# Patient Record
Sex: Male | Born: 1950
Health system: Southern US, Community
[De-identification: ages and names within clinical notes are randomized; demographics above are authoritative.]

## PROBLEM LIST (undated history)

## (undated) DIAGNOSIS — G893 Neoplasm related pain (acute) (chronic): Secondary | ICD-10-CM

## (undated) DIAGNOSIS — N400 Enlarged prostate without lower urinary tract symptoms: Secondary | ICD-10-CM

## (undated) DIAGNOSIS — Z5111 Encounter for antineoplastic chemotherapy: Secondary | ICD-10-CM

## (undated) DIAGNOSIS — I6529 Occlusion and stenosis of unspecified carotid artery: Secondary | ICD-10-CM

## (undated) DIAGNOSIS — C801 Malignant (primary) neoplasm, unspecified: Secondary | ICD-10-CM

## (undated) DIAGNOSIS — I10 Essential (primary) hypertension: Secondary | ICD-10-CM

## (undated) DIAGNOSIS — I739 Peripheral vascular disease, unspecified: Secondary | ICD-10-CM

## (undated) HISTORY — DX: Occlusion and stenosis of unspecified carotid artery: I65.29

## (undated) HISTORY — DX: Encounter for antineoplastic chemotherapy: Z51.11

## (undated) HISTORY — DX: Neoplasm related pain (acute) (chronic): G89.3

## (undated) HISTORY — PX: OTHER SURGICAL HISTORY: SHX169

## (undated) HISTORY — DX: Benign prostatic hyperplasia without lower urinary tract symptoms: N40.0

---

## 2015-04-28 DIAGNOSIS — I10 Essential (primary) hypertension: Secondary | ICD-10-CM

## 2015-04-28 DIAGNOSIS — I739 Peripheral vascular disease, unspecified: Secondary | ICD-10-CM | POA: Insufficient documentation

## 2015-04-28 DIAGNOSIS — E785 Hyperlipidemia, unspecified: Secondary | ICD-10-CM | POA: Insufficient documentation

## 2015-04-28 HISTORY — DX: Peripheral vascular disease, unspecified: I73.9

## 2015-04-28 HISTORY — DX: Hyperlipidemia, unspecified: E78.5

## 2015-04-28 HISTORY — DX: Essential (primary) hypertension: I10

## 2015-12-15 ENCOUNTER — Other Ambulatory Visit: Payer: Self-pay | Admitting: Orthopedic Surgery

## 2015-12-15 ENCOUNTER — Other Ambulatory Visit (HOSPITAL_COMMUNITY)
Admission: RE | Admit: 2015-12-15 | Discharge: 2015-12-15 | Disposition: A | Discharge status: Home / Self Care | Payer: PRIVATE HEALTH INSURANCE | Source: Other Acute Inpatient Hospital | Attending: Specialist | Admitting: Specialist

## 2015-12-15 ENCOUNTER — Ambulatory Visit: Payer: Self-pay | Admitting: Orthopedic Surgery

## 2015-12-15 DIAGNOSIS — C9000 Multiple myeloma not having achieved remission: Secondary | ICD-10-CM | POA: Diagnosis not present

## 2015-12-15 DIAGNOSIS — R222 Localized swelling, mass and lump, trunk: Principal | ICD-10-CM

## 2015-12-15 DIAGNOSIS — M533 Sacrococcygeal disorders, not elsewhere classified: Secondary | ICD-10-CM

## 2015-12-15 LAB — COMPREHENSIVE METABOLIC PANEL
ALBUMIN: 4.2 g/dL (ref 3.5–5.0)
ALK PHOS: 123 U/L (ref 38–126)
ALT: 25 U/L (ref 17–63)
ANION GAP: 8 (ref 5–15)
AST: 29 U/L (ref 15–41)
BILIRUBIN TOTAL: 0.9 mg/dL (ref 0.3–1.2)
BUN: 15 mg/dL (ref 6–20)
CALCIUM: 9.9 mg/dL (ref 8.9–10.3)
CO2: 28 mmol/L (ref 22–32)
Chloride: 100 mmol/L — ABNORMAL LOW (ref 101–111)
Creatinine, Ser: 0.83 mg/dL (ref 0.61–1.24)
GLUCOSE: 103 mg/dL — AB (ref 65–99)
Potassium: 3.8 mmol/L (ref 3.5–5.1)
Sodium: 136 mmol/L (ref 135–145)
TOTAL PROTEIN: 7.5 g/dL (ref 6.5–8.1)

## 2015-12-15 LAB — CBC WITH DIFFERENTIAL/PLATELET
BASOS ABS: 0.1 10*3/uL (ref 0.0–0.1)
BASOS PCT: 1 %
Eosinophils Absolute: 0.2 10*3/uL (ref 0.0–0.7)
Eosinophils Relative: 3 %
HEMATOCRIT: 37 % — AB (ref 39.0–52.0)
Hemoglobin: 11.9 g/dL — ABNORMAL LOW (ref 13.0–17.0)
Lymphocytes Relative: 23 %
Lymphs Abs: 1.3 10*3/uL (ref 0.7–4.0)
MCH: 27.4 pg (ref 26.0–34.0)
MCHC: 32.2 g/dL (ref 30.0–36.0)
MCV: 85.1 fL (ref 78.0–100.0)
MONO ABS: 0.8 10*3/uL (ref 0.1–1.0)
Monocytes Relative: 14 %
NEUTROS ABS: 3.3 10*3/uL (ref 1.7–7.7)
Neutrophils Relative %: 59 %
PLATELETS: 288 10*3/uL (ref 150–400)
RBC: 4.35 MIL/uL (ref 4.22–5.81)
RDW: 14.7 % (ref 11.5–15.5)
WBC: 5.6 10*3/uL (ref 4.0–10.5)

## 2015-12-15 LAB — URIC ACID: URIC ACID, SERUM: 8.1 mg/dL — AB (ref 4.4–7.6)

## 2015-12-15 LAB — SEDIMENTATION RATE: Sed Rate: 14 mm/hr (ref 0–16)

## 2015-12-15 LAB — C-REACTIVE PROTEIN: CRP: 0.8 mg/dL (ref <1.0)

## 2015-12-16 ENCOUNTER — Encounter: Payer: Self-pay | Admitting: Hematology & Oncology

## 2015-12-17 LAB — IFE AND PE, RANDOM URINE
% BETA, Urine: 30.8 %
ALPHA 1 URINE: 3.3 %
ALPHA 2 UR: 13.6 %
Albumin, U: 36 %
GAMMA GLOBULIN URINE: 16.4 %
TOTAL PROTEIN, URINE-UPE24: 8 mg/dL

## 2015-12-17 LAB — MISC LABCORP TEST (SEND OUT)
LABCORP TEST CODE: 122390
LABCORP TEST CODE: 1495

## 2015-12-17 LAB — PSA, TOTAL AND FREE
PSA FREE PCT: 27.7 %
PSA FREE: 0.72 ng/mL
Prostate Specific Ag, Serum: 2.6 ng/mL (ref 0.0–4.0)

## 2015-12-17 LAB — ANTINUCLEAR ANTIBODIES, IFA: ANA Ab, IFA: NEGATIVE

## 2015-12-18 ENCOUNTER — Telehealth: Payer: Self-pay | Admitting: Hematology

## 2015-12-18 ENCOUNTER — Inpatient Hospital Stay
Admission: RE | Admit: 2015-12-18 | Discharge: 2015-12-18 | Disposition: A | Discharge status: Home / Self Care | Payer: Self-pay | Source: Ambulatory Visit | Attending: Hematology | Admitting: Hematology

## 2015-12-18 ENCOUNTER — Encounter: Payer: Self-pay | Admitting: Hematology

## 2015-12-18 ENCOUNTER — Other Ambulatory Visit (HOSPITAL_BASED_OUTPATIENT_CLINIC_OR_DEPARTMENT_OTHER): Payer: PRIVATE HEALTH INSURANCE

## 2015-12-18 ENCOUNTER — Telehealth (HOSPITAL_COMMUNITY): Payer: Self-pay

## 2015-12-18 ENCOUNTER — Ambulatory Visit (HOSPITAL_BASED_OUTPATIENT_CLINIC_OR_DEPARTMENT_OTHER): Payer: PRIVATE HEALTH INSURANCE | Admitting: Hematology

## 2015-12-18 ENCOUNTER — Ambulatory Visit (HOSPITAL_COMMUNITY)
Admission: RE | Admit: 2015-12-18 | Discharge: 2015-12-18 | Disposition: A | Discharge status: Home / Self Care | Payer: PRIVATE HEALTH INSURANCE | Source: Ambulatory Visit | Attending: Hematology | Admitting: Hematology

## 2015-12-18 ENCOUNTER — Other Ambulatory Visit: Payer: Self-pay | Admitting: Hematology

## 2015-12-18 VITALS — BP 143/77 | HR 89 | Temp 98.0°F | Resp 18 | Ht 73.0 in | Wt 188.8 lb

## 2015-12-18 DIAGNOSIS — R222 Localized swelling, mass and lump, trunk: Secondary | ICD-10-CM

## 2015-12-18 DIAGNOSIS — R938 Abnormal findings on diagnostic imaging of other specified body structures: Secondary | ICD-10-CM | POA: Insufficient documentation

## 2015-12-18 DIAGNOSIS — M898X8 Other specified disorders of bone, other site: Secondary | ICD-10-CM | POA: Insufficient documentation

## 2015-12-18 DIAGNOSIS — C7951 Secondary malignant neoplasm of bone: Secondary | ICD-10-CM

## 2015-12-18 DIAGNOSIS — C801 Malignant (primary) neoplasm, unspecified: Secondary | ICD-10-CM

## 2015-12-18 DIAGNOSIS — S3210XG Unspecified fracture of sacrum, subsequent encounter for fracture with delayed healing: Secondary | ICD-10-CM

## 2015-12-18 DIAGNOSIS — G893 Neoplasm related pain (acute) (chronic): Secondary | ICD-10-CM

## 2015-12-18 DIAGNOSIS — N319 Neuromuscular dysfunction of bladder, unspecified: Secondary | ICD-10-CM | POA: Diagnosis not present

## 2015-12-18 LAB — CBC & DIFF AND RETIC
BASO%: 0.6 % (ref 0.0–2.0)
BASOS ABS: 0.1 10*3/uL (ref 0.0–0.1)
EOS ABS: 0.1 10*3/uL (ref 0.0–0.5)
EOS%: 1 % (ref 0.0–7.0)
HEMATOCRIT: 38.3 % — AB (ref 38.4–49.9)
HEMOGLOBIN: 12.6 g/dL — AB (ref 13.0–17.1)
Immature Retic Fract: 9.4 % (ref 3.00–10.60)
LYMPH#: 1.7 10*3/uL (ref 0.9–3.3)
LYMPH%: 19.1 % (ref 14.0–49.0)
MCH: 27.8 pg (ref 27.2–33.4)
MCHC: 32.9 g/dL (ref 32.0–36.0)
MCV: 84.5 fL (ref 79.3–98.0)
MONO#: 0.7 10*3/uL (ref 0.1–0.9)
MONO%: 7.7 % (ref 0.0–14.0)
NEUT#: 6.3 10*3/uL (ref 1.5–6.5)
NEUT%: 71.6 % (ref 39.0–75.0)
PLATELETS: 324 10*3/uL (ref 140–400)
RBC: 4.53 10*6/uL (ref 4.20–5.82)
RDW: 14.7 % — ABNORMAL HIGH (ref 11.0–14.6)
RETIC CT ABS: 103.28 10*3/uL — AB (ref 34.80–93.90)
Retic %: 2.28 % — ABNORMAL HIGH (ref 0.80–1.80)
WBC: 8.8 10*3/uL (ref 4.0–10.3)

## 2015-12-18 LAB — MULTIPLE MYELOMA PANEL, SERUM
ALBUMIN SERPL ELPH-MCNC: 3.7 g/dL (ref 2.9–4.4)
ALBUMIN/GLOB SERPL: 1.2 (ref 0.7–1.7)
ALPHA 1: 0.3 g/dL (ref 0.0–0.4)
ALPHA2 GLOB SERPL ELPH-MCNC: 0.9 g/dL (ref 0.4–1.0)
B-Globulin SerPl Elph-Mcnc: 1 g/dL (ref 0.7–1.3)
Gamma Glob SerPl Elph-Mcnc: 0.9 g/dL (ref 0.4–1.8)
Globulin, Total: 3.1 g/dL (ref 2.2–3.9)
IGA: 88 mg/dL (ref 61–437)
IGG (IMMUNOGLOBIN G), SERUM: 893 mg/dL (ref 700–1600)
IGM, SERUM: 32 mg/dL (ref 20–172)
Total Protein ELP: 6.8 g/dL (ref 6.0–8.5)

## 2015-12-18 LAB — COMPREHENSIVE METABOLIC PANEL
ALT: 25 U/L (ref 0–55)
AST: 27 U/L (ref 5–34)
Albumin: 4.2 g/dL (ref 3.5–5.0)
Alkaline Phosphatase: 138 U/L (ref 40–150)
Anion Gap: 11 mEq/L (ref 3–11)
BILIRUBIN TOTAL: 0.66 mg/dL (ref 0.20–1.20)
BUN: 20.8 mg/dL (ref 7.0–26.0)
CO2: 28 meq/L (ref 22–29)
CREATININE: 1 mg/dL (ref 0.7–1.3)
Calcium: 10.5 mg/dL — ABNORMAL HIGH (ref 8.4–10.4)
Chloride: 100 mEq/L (ref 98–109)
EGFR: 78 mL/min/{1.73_m2} — AB (ref >90)
GLUCOSE: 116 mg/dL (ref 70–140)
Potassium: 4.3 mEq/L (ref 3.5–5.1)
Sodium: 140 mEq/L (ref 136–145)
TOTAL PROTEIN: 8.2 g/dL (ref 6.4–8.3)

## 2015-12-18 LAB — LACTATE DEHYDROGENASE: LDH: 254 U/L — ABNORMAL HIGH (ref 125–245)

## 2015-12-18 MED ORDER — POLYETHYLENE GLYCOL 3350 17 G PO PACK: 17.0000 g | PACK | Freq: Every day | ORAL | Status: DC

## 2015-12-18 MED ORDER — LORAZEPAM 0.5 MG PO TABS: 0.5000 mg | ORAL_TABLET | Freq: Three times a day (TID) | ORAL | Status: DC | PRN

## 2015-12-18 MED ORDER — SENNOSIDES-DOCUSATE SODIUM 8.6-50 MG PO TABS: 2.0000 | ORAL_TABLET | Freq: Every day | ORAL | Status: DC

## 2015-12-18 MED ORDER — OXYCODONE-ACETAMINOPHEN 5-325 MG PO TABS: 1.0000 | ORAL_TABLET | ORAL | Status: DC | PRN

## 2015-12-18 MED ORDER — OXYCODONE HCL ER 10 MG PO T12A: 10.0000 mg | EXTENDED_RELEASE_TABLET | Freq: Two times a day (BID) | ORAL | Status: DC

## 2015-12-18 NOTE — Telephone Encounter (Signed)
GAVE PT CAL & AVS

## 2015-12-18 NOTE — Progress Notes (Signed)
[name]    HEMATOLOGY/ONCOLOGY CONSULTATION NOTE  Date of Service: 12/18/2015  Patient Care Team: No Pcp Per Patient as PCP - General (General Practice)  CHIEF COMPLAINTS/PURPOSE OF CONSULTATION:  Newly diagnosed Metastatic Malignancy  HISTORY OF PRESENTING ILLNESS:   Erik Vaughn is a wonderful 65 y.o. male who has been referred to [place] by Dr  Tonita Cong for evaluation and management of Newly diagnosed metastatic malignancy.  Patient is a history of hypertension, dyslipidemia, peripheral arterial disease but otherwise has been in good health overall.  Was in ex-smoker with a 80 pack year history of smoking quit about 8-9 years ago. Patient presented to Dr. Tonita Cong (orthopedics) with worsening lower back pain especially in the left lower back radiating into the left leg and foot since May 2017.  Patient notes that the pain has been progressive ill-defined that he cannot sit for a length of time nor any sleep comfortably on his back.   He was recently prescribed Percocet which he notes has helped.  He also reports that he has been having some difficulties controlling his bladder and has been losing control of his urine for the Last several weeks to a month. No overt lower extremity weakness.  Over the last 3 weeks the patient has also noted a prominent left chest wall mass. He reports having lost about 22 pounds in the last several months.  He feels a part of that might be intentional since he was trying to lose weight. He reports that he also initially had left lower extremity swelling and had an ultrasound of the lower extremity on 12/12/2015 that showed no evidence of DVT.  His lower extremity swelling has since improved.  Patient had an MRI of the lumbar spine with his orthopedic doctor on 12/13/2015 which showed pathologic fractures of the left sacrum ala and S1 body with diffuse marrow edema.  She is a partially visualized soft tissue mass anterior to the left sacral ala within the left L5-S1 and S1-S2  neural foramina. Neoplastic marrow infiltration also is noted in the left posterior iilium. Primary concern is metastatic disease.  Myeloma and lymphoma are also in the differential diagnosis. He was also noted to have multilevel degenerative changes in the lumbar spine but no neural displacement or encroachment.  A PSA level was done which was noted to be within normal limits.  Patient had an ultrasound of his left chest wall mass which showed an irregular marginated hypoechoic mass measuring 6.3 x 3.7 x 7.3 cm in the left pectoral region.  It appears to lie within or deep to the pectoralis musculature.  It is nontender and appears fixed.   Patient presents today for further evaluation and management with his daughter who is a Marine scientist and his wife.  He had both of his been using about 3-4 Percocets a day to try to help control his lower back pain. He had a chest x-ray today which showed a destructive lytic lesion of the ninth rib on the left posteriorly, abnormal chest wall density posteriorly with the mass measuring at least 5.7 cm and an anterior chest wall mass as well.  No overt shortness of breath or chest pain.  No headaches.  No focal neurological deficits.  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   He smokes about 2 packs a day for 40 years.  SURGICAL HISTORY:  #  1 left submandibular salivary gland benign tumor excision  SOCIAL HISTORY: Social History   Social History  . Marital Status: Married    Spouse Name: N/A  . Number of Children: N/A  . Years of Education: N/A   Occupational History  . Not on file.   Social History Main Topics  . Smoking status: Former Smoker -- 2.00 packs/day  . Smokeless tobacco: Never Used  . Alcohol Use: Yes     Comment: occasionally  . Drug Use: Not on  file  . Sexual Activity: Not on file   Other Topics Concern  . Not on file   Social History Narrative  . No narrative on file  smoked 2 packs of cigarettes per day for 40 years quit about 8-9 years ago. Denies significant alcohol use. 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  FAMILY HISTORY:  Sister had breast cancer at age 38 years. Unknown if she had genetic testing One maternal uncle had melanoma Second maternal uncle had pancreatic cancer under the age of 43yrPaternal uncle with prostate cancer Dad had prostate cancer at age 2116years   ALLERGIES:  has No Known Allergies.  MEDICATIONS:  Current Outpatient Prescriptions  Medication Sig Dispense Refill  . amLODipine (NORVASC) 10 MG tablet     . aspirin 81 MG tablet Take 81 mg by mouth daily.    . Cholecalciferol (VITAMIN D3) 2000 units TABS Take 2,000 Units by mouth daily.    . clopidogrel (PLAVIX) 75 MG tablet     . lisinopril (PRINIVIL,ZESTRIL) 20 MG tablet     . Multiple Vitamins-Minerals (CENTRUM SILVER PO) Take by mouth daily.    . Omega-3 Fatty Acids (FISH OIL) 1200 MG CAPS Take 3,600 mg by mouth daily.    .[name] KitchenoxyCODONE-acetaminophen (PERCOCET/ROXICET) 5-325 MG tablet Take 1-2 tablets by mouth every 4 (four) hours as needed for severe pain. 60 tablet 0  . PREDNISONE, PAK, PO Take by mouth.    . rosuvastatin (CRESTOR) 5 MG tablet     . LORazepam (ATIVAN) 0.5 MG tablet Take 1 tablet (0.5 mg total) by mouth every 8 (eight) hours as needed for anxiety. Could take 2 tabs ('1mg'$ ) 30 mins prior to PET/CT scan 20 tablet 0  . oxyCODONE (OXYCONTIN) 10 mg 12 hr tablet Take 1 tablet (10 mg total) by mouth every 12 (twelve) hours. 60 tablet 0  . polyethylene glycol (MIRALAX) packet Take 17 g by mouth daily. 30 each 1  . senna-docusate (SENNA S) 8.6-50 MG tablet Take 2  tablets by mouth at bedtime. 60 tablet 1   No current facility-administered medications for this visit.    REVIEW OF SYSTEMS:    10 Point review of Systems was done is negative except as noted above.  PHYSICAL EXAMINATION: ECOG PERFORMANCE STATUS: 1 - Symptomatic but completely ambulatory  . Filed Vitals:   12/18/15 1126  BP: 143/77  Pulse: 89  Temp: 98 F (36.7 C)  Resp: 18   Filed Weights   12/18/15 1126  Weight: 188 lb 12.8 oz (85.639 kg)   .Body mass index is 24.91 kg/(m^2).  GENERAL:alert, in no acute distress and comfortable SKIN: skin color, texture, turgor are normal, no rashes or significant lesions EYES: normal, conjunctiva are pink and non-injected, sclera clear OROPHARYNX:no exudate, no erythema and lips, buccal mucosa, and tongue  normal  NECK: supple, no JVD, thyroid normal size, non-tender, without nodularity LYMPH:  no palpable lymphadenopathy in the cervical, axillary or inguinal LUNGS: clear to auscultation with normal respiratory effort, large left anterior chest wall mass noted appears nontender and fixed. HEART: regular rate & rhythm,  no murmurs and no lower extremity edema ABDOMEN: abdomen soft, non-tender, normoactive bowel sounds  Musculoskeletal: no cyanosis of digits and no clubbing  PSYCH: alert & oriented x 3 with fluent speech NEURO: no focal motor/sensory deficits  LABORATORY DATA:  I have reviewed the data as listed  . CBC Latest Ref Rng 12/18/2015 12/15/2015  WBC 4.0 - 10.3 10e3/uL 8.8 5.6  Hemoglobin 13.0 - 17.1 g/dL 12.6(L) 11.9(L)  Hematocrit 38.4 - 49.9 % 38.3(L) 37.0(L)  Platelets 140 - 400 10e3/uL 324 288   . CBC    Component Value Date/Time   WBC 8.8 12/18/2015 1330   WBC 5.6 12/15/2015 1645   RBC 4.53 12/18/2015 1330   RBC 4.35 12/15/2015 1645   HGB 12.6* 12/18/2015 1330   HGB 11.9* 12/15/2015 1645   HCT 38.3* 12/18/2015 1330   HCT 37.0* 12/15/2015 1645   PLT 324 12/18/2015 1330   PLT 288 12/15/2015 1645   MCV 84.5  12/18/2015 1330   MCV 85.1 12/15/2015 1645   MCH 27.8 12/18/2015 1330   MCH 27.4 12/15/2015 1645   MCHC 32.9 12/18/2015 1330   MCHC 32.2 12/15/2015 1645   RDW 14.7* 12/18/2015 1330   RDW 14.7 12/15/2015 1645   LYMPHSABS 1.7 12/18/2015 1330   LYMPHSABS 1.3 12/15/2015 1645   MONOABS 0.7 12/18/2015 1330   MONOABS 0.8 12/15/2015 1645   EOSABS 0.1 12/18/2015 1330   EOSABS 0.2 12/15/2015 1645   BASOSABS 0.1 12/18/2015 1330   BASOSABS 0.1 12/15/2015 1645   . Lab Results  Component Value Date   LDH 254* 12/18/2015   . CMP Latest Ref Rng 12/18/2015 12/15/2015  Glucose 70 - 140 mg/dl 116 103(H)  BUN 7.0 - 26.0 mg/dL 20.8 15  Creatinine 0.7 - 1.3 mg/dL 1.0 0.83  Sodium 136 - 145 mEq/L 140 136  Potassium 3.5 - 5.1 mEq/L 4.3 3.8  Chloride 101 - 111 mmol/L - 100(L)  CO2 22 - 29 mEq/L 28 28  Calcium 8.4 - 10.4 mg/dL 10.5(H) 9.9  Total Protein 6.4 - 8.3 g/dL 8.2 7.5  Total Bilirubin 0.20 - 1.20 mg/dL 0.66 0.9  Alkaline Phos 40 - 150 U/L 138 123  AST 5 - 34 U/L 27 29  ALT 0 - 55 U/L 25 25     RADIOGRAPHIC STUDIES: I have personally reviewed the radiological images as listed and agreed with the findings in the report. Dg Chest 2 View  12/18/2015  CLINICAL DATA:  Metastatic cancer.  Left chest wall mass. EXAM: CHEST  2 VIEW COMPARISON:  None. FINDINGS: Heart size is normal. There is aortic atherosclerosis. No abnormality is seen in the right chest. On the frontal view, there is abnormal density projected over the left lower chest. There is lytic destruction of the left ninth rib posteriorly. There is a posterior chest wall mass measuring at least 5.7 cm in diameter seen on the lateral. Additionally, there is in the anterior retrosternal density on the lateral that could also be a chest wall mass anteriorly. No effusions. No spinal fracture seen. IMPRESSION: Lytic destructive lesion of the ninth rib on the left posteriorly. Abnormal chest wall density posteriorly with a mass measuring at least  5.7 cm. Possible anterior chest wall mass as well.  Electronically Signed   By: Nelson Chimes M.D.   On: 12/18/2015 13:59   [employer]  12/18/2015  CLINICAL DATA:  This exam is stored here for comparison purposes only and was performed at an outside facility.   Please contact the originating institution for any associated interpretation or report.    ASSESSMENT & PLAN:   65 year old Caucasian male with  #1 Metastatic malignancy of unclear primary.  This appears to be involving the sacrum causing sacral and root involvement with possible neurogenic bladder. Also appears to be causing lytic lesions in the ribs and anterior chest wall mass and a possible left posterior chest wall mass as well. Metastatic malignancy possibly with lung primary, lymphoma, myeloma and sarcoma are the most likely concerns. PSA was within normal limits. Plan -Patient needs a stat biopsy to determine the tissue diagnosis of the patient's metastatic malignancy especially since he appears to be having sacral nerve root involvement causing neurogenic bladder. -he also needs an urgent PET/CT scan to complete staging and evaluate for the possibility of a primary to determine treatment strategy. -The left anterior chest wall mass appears relatively superficial and should be amenable to an ultrasound-guided core needle biopsy.  he'll need to get samples for lymphoma workup as well. patient is on aspirin and Plavix and hopefully these would not need to be held for a superficial biopsy at a compressible site.  #2 left sacral ala and S1 fracture. L5-S1 and S1-S2 left-sided nerve root compression. #3 neoplasm related pain Plan -given patient prescription for OxyContin 10 mg by mouth twice a day for baseline pain control. -He will use 1-2 Percocets when necessary for additional pain control as needed. -given Ativan when necessary for anxiety and for use prior to PET/CT scan. -based on diagnosis patient might need  palliative radiation to his sacral mass. -he was given a prescription for SI joint brace and help with pain control.  #4 Hypertension, dyslipidemia, peripheral arterial disease -Continue management as per primary care physician.  #5 ex-smoker with 80-pack-year history of smoking.  #6 cancer screening - notes that he had his last colonoscopy about 10 years ago and was found to have some benign polyps.  Has never had a CT of the chest for lung cancer screening.   Recent PSA was normal limits.  All of the patients and his family's questions were answered in details to their apparent satisfaction. The patient knows to call the clinic with any problems, questions or concerns.  I spent 60 minutes counseling the patient face to face. The total time spent in the appointment was 70 minutes and more than 50% was on counseling and direct patient cares.    Sullivan Lone MD [place] AAHIVMS [facility] Hematology/Oncology Physician [facility]  (Office):       [phone] (Work cell):  [phone] (Fax):           [phone]  12/18/2015 10:07 PM

## 2015-12-19 LAB — BETA 2 MICROGLOBULIN, SERUM: Beta-2: 3.1 mg/L — ABNORMAL HIGH (ref 0.6–2.4)

## 2015-12-19 LAB — KAPPA/LAMBDA LIGHT CHAINS
IG KAPPA FREE LIGHT CHAIN: 31.9 mg/L — AB (ref 3.3–19.4)
Ig Lambda Free Light Chain: 11.9 mg/L (ref 5.7–26.3)
Kappa/Lambda FluidC Ratio: 2.68 — ABNORMAL HIGH (ref 0.26–1.65)

## 2015-12-20 ENCOUNTER — Encounter (HOSPITAL_COMMUNITY): Payer: Self-pay | Admitting: Emergency Medicine

## 2015-12-20 ENCOUNTER — Emergency Department (HOSPITAL_COMMUNITY)
Admission: EM | Admit: 2015-12-20 | Discharge: 2015-12-20 | Disposition: A | Discharge status: Home / Self Care | Payer: PRIVATE HEALTH INSURANCE | Attending: Emergency Medicine | Admitting: Emergency Medicine

## 2015-12-20 DIAGNOSIS — M25552 Pain in left hip: Secondary | ICD-10-CM | POA: Diagnosis present

## 2015-12-20 DIAGNOSIS — C7951 Secondary malignant neoplasm of bone: Secondary | ICD-10-CM | POA: Insufficient documentation

## 2015-12-20 DIAGNOSIS — I10 Essential (primary) hypertension: Secondary | ICD-10-CM | POA: Diagnosis not present

## 2015-12-20 DIAGNOSIS — Z87891 Personal history of nicotine dependence: Secondary | ICD-10-CM | POA: Diagnosis not present

## 2015-12-20 DIAGNOSIS — Z7982 Long term (current) use of aspirin: Secondary | ICD-10-CM | POA: Insufficient documentation

## 2015-12-20 DIAGNOSIS — G893 Neoplasm related pain (acute) (chronic): Secondary | ICD-10-CM | POA: Diagnosis not present

## 2015-12-20 DIAGNOSIS — C3490 Malignant neoplasm of unspecified part of unspecified bronchus or lung: Secondary | ICD-10-CM | POA: Diagnosis not present

## 2015-12-20 DIAGNOSIS — I739 Peripheral vascular disease, unspecified: Secondary | ICD-10-CM | POA: Insufficient documentation

## 2015-12-20 DIAGNOSIS — Z79891 Long term (current) use of opiate analgesic: Secondary | ICD-10-CM | POA: Diagnosis not present

## 2015-12-20 DIAGNOSIS — Z79899 Other long term (current) drug therapy: Secondary | ICD-10-CM | POA: Diagnosis not present

## 2015-12-20 HISTORY — DX: Peripheral vascular disease, unspecified: I73.9

## 2015-12-20 HISTORY — DX: Essential (primary) hypertension: I10

## 2015-12-20 MED ORDER — ONDANSETRON 4 MG PO TBDP
4.0000 mg | ORAL_TABLET | Freq: Once | ORAL | Status: AC
  Administered 2015-12-20: 4 mg via ORAL
  Filled 2015-12-20: qty 1

## 2015-12-20 MED ORDER — HYDROMORPHONE HCL 2 MG/ML IJ SOLN
2.0000 mg | Freq: Once | INTRAMUSCULAR | Status: AC
  Administered 2015-12-20: 2 mg via INTRAMUSCULAR
  Filled 2015-12-20: qty 1

## 2015-12-20 NOTE — ED Notes (Signed)
PT request to wait 91mns before lab work due to med RN made aware

## 2015-12-20 NOTE — Discharge Instructions (Signed)
Please increase Oxycotin 10 mg CR from one tablet every 12 hours to two tablets every 12 hours. He can continue to use percocet every 6 hours if needed. Please do not take at the same time. Please be careful with narcotic medications and increased risk of sedation. Please keep your follow up appointments.    Bone Metastasis Bone metastasis is cancer that spreads to the bones from another part of the body. A person may have bone metastasis in one bone or in more than one bone. Cancer that spreads to the bones is different from cancer that starts in the bones (primary bone cancer). Bone metastasis is more common than primary bone cancer. The spine is the most common area for bone metastasis. Other common areas include:  Hip bone (pelvis).  Ribs.  Skull.  Long bones of the arm or leg. Bone metastasis is painful, and it damages the bones. Bone metastasis damages and weakens bones in two ways. A person may have bone destruction (osteolytic damage) or abnormal bone growth (osteoblastic destruction). Both of these conditions can make bones so weak that they break (pathologic fracture) even from a minor injury. CAUSES This condition is caused by cancer cells that spread to bone. These cells can get into your bloodstream and spread through your body. They can also get into the vessels that are part of your lymphatic system (lymph vessels) and spread that way. RISK FACTORS This condition is more likely to develop in people who have an advanced type of cancer that is known to spread to bone. Cancers that often spread to bone include:  Breast cancer.  Prostate cancer.  Lung cancer.  Thyroid cancer.  Kidney cancer. SYMPTOMS The most common symptom of this condition is bone pain, especially while you are resting. Other symptoms include:  A broken bone (fracture) that happens with little or no trauma.  Low number of red blood cells (anemia). Bone destruction may damage the spongy tissue (bone  marrow) in the center of some bones where red blood cells are produced. Anemia can cause:  Weakness.  Shortness of breath.  Headache.  Dizziness.  Back or neck pain with numbness or weakness, especially if you have bone metastasis in your spine.  High levels of calcium in your blood (hypercalcemia). When bone is destroyed, calcium is released into your blood. Symptoms of hypercalcemia include:  Constipation.  Thirst.  Nausea.  Sleepiness. DIAGNOSIS This condition may be diagnosed based on:  Your symptoms and medical history. Your health care provider may suspect this condition if you are being treated for cancer or have had cancer treatment in the past.  A physical exam.  Imaging studies, such as:  Bone X-rays, especially in the area where you have pain.  CT scan.  Bone scan.  MRI.  Blood tests to check for anemia or hypercalcemia.  A procedure to remove a piece of bone so it can be examined under a microscope (biopsy). TREATMENT Treatment for this condition depends on your overall health, the type of cancer you have, and how much the cancer has spread. You will work with a team of health care providers to determine which treatment is best for you. Treatment will focus on managing pain, preventing bone weakness, and slowing the spread of the cancer. Treatment may include:  Radiation therapy. This treatment uses X-rays to kill cancer cells. It is most effective for reducing pain, stopping tumor growth, and lowering the risk of fractures.  Radioisotope therapy. This treatment uses a radioactive medicine that  is injected into your blood. The medicine travels to areas where cancer cells are active and kills them.  Chemotherapy. For this treatment, you are given cancer-killing drugs. You may have chemotherapy in cycles, with rest periods in between.  Medicines to block cells that destroy bone (bisphosphonates and denosumab). These medicines are used to control bone pain.  They may help to reduce hypercalcemia.  Medicines to reduce pain (opiates).  Endocrine therapies. These therapies slow cancer growth by blocking specific chemical messengers (hormones). Some types of cancer, including breast and prostate cancers, depend on hormones.  Targeted therapies. These therapies involve the use of drugs that block the growth and spread of cancer. This treatment is different from standard chemotherapy.  Immunotherapies. These therapies use the body's defense system (immune system) to fight cancer cells.  Surgery. You may have surgery to remove bone cancer or to prevent or repair a fracture. HOME CARE INSTRUCTIONS  Take medicines only as directed by your health care provider.  Do not drive or operate heavy machinery while taking pain medicine.  Take the following steps to help prevent constipation, which can result from some pain medicines.  Include plenty of fruits and whole grains in your diet.  Drink enough fluid to keep your urine clear or pale yellow.  Ask your health care provider about taking a laxative if needed.  Keep all follow-up visits as directed by your health care provider. This is important. SEEK MEDICAL CARE IF:  Your pain medicine is not helping.  You are too weak to care for yourself at home. SEEK IMMEDIATE MEDICAL CARE IF:  You fall and have pain or an injury.  You have trouble walking.  You have numbness or tingling in your legs.  Your pain suddenly gets worse.  You lose control of your bowels or your bladder.  You are very sleepy or confused.   This information is not intended to replace advice given to you by your health care provider. Make sure you discuss any questions you have with your health care provider.   Document Released: 05/07/2002 Document Revised: 10/01/2014 Document Reviewed: 03/20/2014 Elsevier Interactive Patient Education [employer].

## 2015-12-20 NOTE — ED Notes (Signed)
Per patient, he has back and left hip pain that radiates down his leg.  He states the pain medication that was prescribed to him hasn't touched his pain.  Denies fever or chilis.  Denies any falls, trauma, or injuries.

## 2015-12-20 NOTE — ED Provider Notes (Signed)
CSN: [id number]     Arrival date & time 12/20/15  1134 History   First MD Initiated Contact with Patient 12/20/15 1205     Chief Complaint  Patient presents with  . Back Pain  . Hip Pain   Erik Vaughn is a 65 y.o. male who presents to the ED complaining of Pain to his left hip, his back, and his left upper back for several months. He was recently diagnosed with cancer, with lung as his likely primary source. He has a 80 pack year smoking history. He has had one appointment with oncologist Dr. Irene Limbo who has scheduled him for a head to toe PET scan in 8 days. This is to help with their course of action for treatment. It appears that plan is likely palliative. He appears have metastases to his back, ribs, lungs and chest wall. He was placed on oxycodone 10 mg ER tablet and Percocet 2 tablets every 4 hours for pain. He reports the pain medicine has not been helping with his pain. He continues to have back pain. She reports at times he has lost his bladder but he has had no difficulty urinating and has no other urinary symptoms. He reports his pain can radiate into his legs. He denies any numbness, tingling or weakness. He denies any fevers. He has had no falls. He denies chest pain, shortness of breath, abdominal pain, nausea, vomiting or diarrhea.   Patient is a 65 y.o. male presenting with back pain and hip pain. The history is provided by the patient. No language interpreter was used.  Back Pain Associated symptoms: no abdominal pain, no chest pain, no dysuria, no fever, no headaches, no numbness and no weakness   Hip Pain Associated symptoms include arthralgias. Pertinent negatives include no abdominal pain, chest pain, chills, congestion, coughing, fever, headaches, joint swelling, nausea, neck pain, numbness, rash, sore throat, vomiting or weakness.    Past Medical History  Diagnosis Date  . Hypertension   . PAD (peripheral artery disease) ([place]) left leg   Past Surgical History  Procedure  Laterality Date  . Brain surgery     No family history on file. Social History  Substance Use Topics  . Smoking status: Former Smoker -- 2.00 packs/day  . Smokeless tobacco: Never Used  . Alcohol Use: Yes     Comment: occasionally    Review of Systems  Constitutional: Negative for fever and chills.  HENT: Negative for congestion and sore throat.   Eyes: Negative for visual disturbance.  Respiratory: Negative for cough and shortness of breath.   Cardiovascular: Negative for chest pain.  Gastrointestinal: Negative for nausea, vomiting, abdominal pain and diarrhea.  Genitourinary: Negative for dysuria, urgency, hematuria and difficulty urinating.  Musculoskeletal: Positive for back pain and arthralgias. Negative for joint swelling and neck pain.  Skin: Negative for rash and wound.  Neurological: Negative for weakness, light-headedness, numbness and headaches.      Allergies  Review of patient's allergies indicates no known allergies.  Home Medications   Prior to Admission medications   Medication Sig Start Date End Date Taking? Authorizing Provider  amLODipine (NORVASC) 10 MG tablet Take 10 mg by mouth daily.  11/19/15  Yes Historical Provider, MD  aspirin 81 MG tablet Take 81 mg by mouth daily.   Yes Historical Provider, MD  Cholecalciferol (VITAMIN D3) 2000 units TABS Take 2,000 Units by mouth daily.   Yes Historical Provider, MD  lisinopril (PRINIVIL,ZESTRIL) 20 MG tablet Take 20 mg by mouth daily.  11/19/15  Yes Historical Provider, MD  LORazepam (ATIVAN) 0.5 MG tablet Take 1 tablet (0.5 mg total) by mouth every 8 (eight) hours as needed for anxiety. Could take 2 tabs ('1mg'$ ) 30 mins prior to PET/CT scan 12/18/15  Yes Gautam Juleen China, MD  Multiple Vitamins-Minerals (CENTRUM SILVER PO) Take 1 tablet by mouth daily.    Yes Historical Provider, MD  Omega-3 Fatty Acids (FISH OIL) 1200 MG CAPS Take 3,600 mg by mouth daily.   Yes Historical Provider, MD  oxyCODONE (OXYCONTIN) 10 mg  12 hr tablet Take 1 tablet (10 mg total) by mouth every 12 (twelve) hours. 12/18/15  Yes Brunetta Genera, MD  oxyCODONE-acetaminophen (PERCOCET/ROXICET) 5-325 MG tablet Take 1-2 tablets by mouth every 4 (four) hours as needed for severe pain. 12/18/15  Yes Brunetta Genera, MD  polyethylene glycol [facility]) packet Take 17 g by mouth daily. 12/18/15  Yes Brunetta Genera, MD  rosuvastatin (CRESTOR) 5 MG tablet Take 5 mg by mouth at bedtime.  11/19/15  Yes Historical Provider, MD  senna-docusate (SENNA S) 8.6-50 MG tablet Take 2 tablets by mouth at bedtime. 12/18/15  Yes Brunetta Genera, MD   BP 135/66 mmHg  Pulse 97  Temp(Src) 98 F (36.7 C) (Oral)  Resp 18  SpO2 93% Physical Exam  Constitutional: He is oriented to person, place, and time. He appears well-developed and well-nourished. No distress.  Nontoxic appearing.  HENT:  Head: Normocephalic and atraumatic.  Eyes: Conjunctivae are normal. Pupils are equal, round, and reactive to light. Right eye exhibits no discharge. Left eye exhibits no discharge.  Neck: Neck supple. No JVD present.  Cardiovascular: Normal rate, regular rhythm, normal heart sounds and intact distal pulses.   Bilateral radial, posterior tibialis and dorsalis pedis pulses are intact.    Pulmonary/Chest: Effort normal and breath sounds normal. No stridor. No respiratory distress. He has no wheezes. He has no rales.  Lungs are clear to auscultation bilaterally. He is does have some left lateral chest wall tenderness and he has a large mass overlying his left upper chest wall. No overlying skin changes. No crepitus. No increased work of breathing.  Abdominal: Soft. There is no tenderness. There is no guarding.  Abdomen is soft and nontender to palpation.  Musculoskeletal: He exhibits tenderness. He exhibits no edema.  No midline neck or back tenderness. No back erythema, deformity, ecchymosis or warmth. Patient has good strength in his bilateral upper and lower  extremities. No lower extremity edema or tenderness.  Lymphadenopathy:    He has no cervical adenopathy.  Neurological: He is alert and oriented to person, place, and time. Coordination normal.  Sensation is intact in his bilateral upper and lower extremities. He is alert and oriented 3. Speech is clear and coherent.  Skin: Skin is warm and dry. No rash noted. He is not diaphoretic. No erythema. No pallor.  Psychiatric: He has a normal mood and affect. His behavior is normal.  Nursing note and vitals reviewed.   ED Course  Procedures (including critical care time) Labs Review Labs Reviewed - No data to display  Imaging Review No results found. I have personally reviewed and evaluated these images and lab results as part of my medical decision-making.   EKG Interpretation None      Filed Vitals:   12/20/15 1137 12/20/15 1142 12/20/15 1412  BP: 140/76  135/66  Pulse: 96  97  Temp: 98 F (36.7 C)    TempSrc: Oral    Resp: 16  18  SpO2: 97% 98% 93%     MDM   Meds given in ED:  Medications  HYDROmorphone (DILAUDID) injection 2 mg (2 mg Intramuscular Given 12/20/15 1354)  ondansetron (ZOFRAN-ODT) disintegrating tablet 4 mg (4 mg Oral Given 12/20/15 1353)    New Prescriptions   No medications on file    Final diagnoses:  Neoplasm related pain  Cancer, metastatic to bone [facility])   This is a 65 y.o. male who presents to the ED complaining of Pain to his left hip, his back, and his left upper back for several months. He was recently diagnosed with cancer, with lung as his likely primary source. He has a 80 pack year smoking history. He has had one appointment with oncologist Dr. Irene Limbo who has scheduled him for a head to toe PET scan in 8 days. This is to help with their course of action for treatment. It appears that plan is likely palliative. He appears have metastases to his back, ribs, lungs and chest wall. He was placed on oxycodone 10 mg ER tablet and Percocet 2 tablets every  4 hours for pain. He reports the pain medicine has not been helping with his pain. He continues to have back pain. She reports at times he has lost his bladder but he has had no difficulty urinating and has no other urinary symptoms. He reports his pain can radiate into his legs. He denies any numbness, tingling or weakness. On exam the patient is afebrile nontoxic appearing. He has no focal neurological deficits. He does have tenderness over his left lateral chest wall as well as a mass to his left anterior chest wall. No overlying skin changes. No deformity or crepitus. Abdomen is soft and nontender. Lungs clear to auscultation bilaterally. I had an extensive discussion with the patient and his family about the cause of his pain and his mets. I discussed that the plan appears from oncology for likely palliative radiation. We know what is causing his pain and I do not feel we need further emergent work up at this time. He has good follow up with his PCP in 2 days and oncology in 3 days. He is due for a PET in 8 days. Family and patient would very much like to have his pain better controlled. We will attempt to control his pain and if we are unable we can look at admission for pain control. They do not want admission if at all possible. I think this is very reasonable and will work on pain control.  Patient received 2 mg of IM Dilaudid and Zofran. At recheck patient reports his pain was not 2 out of 10 and he feels that he is ready to go home. Will increase his oxycodone 10 mg extended release tablet 220 mg twice a day. Patient also has Percocet to take when necessary. I discussed that they can continue using Percocet as needed but to be very cautious as narcotics can risk sedation and death if taken too much. His wife controls his medications. She will look out for more sedation and not give him more narcotics if this takes place. They have plenty of pain medications at home and do not need refills. Will have  him keep his follow-up appointment in 2 days with primary care. I encouraged him to discuss his pain and pain management options with primary care. I discussed strict specific return precautions. I advised the patient to follow-up with their primary care provider this week. I advised the patient  to return to the emergency department with new or worsening symptoms or new concerns. The patient and the patient's family verbalized understanding and agreement with plan.    This patient was discussed with Dr. Eulis Foster who agrees with assessment and plan.   Waynetta Pean, PA-C 12/20/15 [place], MD 12/20/15 [phone]

## 2015-12-20 NOTE — ED Notes (Signed)
Discharge instructions given to pt and family at bedside. VSS. Pt and family denied further questions or concerns. Pt able to ambulate with cane to exit without difficulty, with family.

## 2015-12-22 ENCOUNTER — Other Ambulatory Visit: Payer: Self-pay | Admitting: Radiology

## 2015-12-22 ENCOUNTER — Other Ambulatory Visit: Payer: Self-pay | Admitting: *Deleted

## 2015-12-22 ENCOUNTER — Telehealth: Payer: Self-pay | Admitting: *Deleted

## 2015-12-22 LAB — MULTIPLE MYELOMA PANEL, SERUM
ALPHA2 GLOB SERPL ELPH-MCNC: 1.2 g/dL — AB (ref 0.4–1.0)
Albumin SerPl Elph-Mcnc: 3.7 g/dL (ref 2.9–4.4)
Albumin/Glob SerPl: 1 (ref 0.7–1.7)
Alpha 1: 0.3 g/dL (ref 0.0–0.4)
B-Globulin SerPl Elph-Mcnc: 1.2 g/dL (ref 0.7–1.3)
Gamma Glob SerPl Elph-Mcnc: 1.1 g/dL (ref 0.4–1.8)
Globulin, Total: 3.8 g/dL (ref 2.2–3.9)
IGA/IMMUNOGLOBULIN A, SERUM: 98 mg/dL (ref 61–437)
IGG (IMMUNOGLOBIN G), SERUM: 993 mg/dL (ref 700–1600)
IGM (IMMUNOGLOBIN M), SRM: 33 mg/dL (ref 20–172)
TOTAL PROTEIN: 7.5 g/dL (ref 6.0–8.5)

## 2015-12-22 NOTE — Telephone Encounter (Signed)
FYI Voicemail: "He is on his way to the [facility] ED for intractable pain."  Returned call today to learn "his pain is under control at this time.  I was calling to notify the on-call doctor.  He is to see orthopedic Dr. Hyman Bower today/  He will need scans earlier.  We see Dr. Irene Limbo January 01, 2016."

## 2015-12-23 ENCOUNTER — Ambulatory Visit (HOSPITAL_COMMUNITY)
Admission: RE | Admit: 2015-12-23 | Discharge: 2015-12-23 | Disposition: A | Discharge status: Home / Self Care | Payer: PRIVATE HEALTH INSURANCE | Source: Ambulatory Visit | Attending: Hematology | Admitting: Hematology

## 2015-12-23 ENCOUNTER — Encounter (HOSPITAL_COMMUNITY): Payer: Self-pay

## 2015-12-23 DIAGNOSIS — Z87891 Personal history of nicotine dependence: Secondary | ICD-10-CM | POA: Insufficient documentation

## 2015-12-23 DIAGNOSIS — C8519 Unspecified B-cell lymphoma, extranodal and solid organ sites: Secondary | ICD-10-CM | POA: Diagnosis not present

## 2015-12-23 DIAGNOSIS — I739 Peripheral vascular disease, unspecified: Secondary | ICD-10-CM | POA: Insufficient documentation

## 2015-12-23 DIAGNOSIS — Z7982 Long term (current) use of aspirin: Secondary | ICD-10-CM | POA: Diagnosis not present

## 2015-12-23 DIAGNOSIS — R222 Localized swelling, mass and lump, trunk: Secondary | ICD-10-CM

## 2015-12-23 DIAGNOSIS — M545 Low back pain: Secondary | ICD-10-CM | POA: Diagnosis not present

## 2015-12-23 DIAGNOSIS — Z79899 Other long term (current) drug therapy: Secondary | ICD-10-CM | POA: Insufficient documentation

## 2015-12-23 DIAGNOSIS — I10 Essential (primary) hypertension: Secondary | ICD-10-CM | POA: Diagnosis not present

## 2015-12-23 DIAGNOSIS — C7951 Secondary malignant neoplasm of bone: Secondary | ICD-10-CM

## 2015-12-23 DIAGNOSIS — E785 Hyperlipidemia, unspecified: Secondary | ICD-10-CM | POA: Diagnosis not present

## 2015-12-23 LAB — PROTIME-INR
INR: 1.17 (ref 0.00–1.49)
PROTHROMBIN TIME: 14.6 s (ref 11.6–15.2)

## 2015-12-23 LAB — CBC
HCT: 35.5 % — ABNORMAL LOW (ref 39.0–52.0)
Hemoglobin: 11.6 g/dL — ABNORMAL LOW (ref 13.0–17.0)
MCH: 27.4 pg (ref 26.0–34.0)
MCHC: 32.7 g/dL (ref 30.0–36.0)
MCV: 83.9 fL (ref 78.0–100.0)
PLATELETS: 357 10*3/uL (ref 150–400)
RBC: 4.23 MIL/uL (ref 4.22–5.81)
RDW: 14 % (ref 11.5–15.5)
WBC: 6.6 10*3/uL (ref 4.0–10.5)

## 2015-12-23 LAB — APTT: aPTT: 31 seconds (ref 24–37)

## 2015-12-23 MED ORDER — FENTANYL CITRATE (PF) 100 MCG/2ML IJ SOLN
INTRAMUSCULAR | Status: AC | PRN
  Administered 2015-12-23: 50 ug via INTRAVENOUS

## 2015-12-23 MED ORDER — MIDAZOLAM HCL 2 MG/2ML IJ SOLN
INTRAMUSCULAR | Status: AC | PRN
  Administered 2015-12-23: 1 mg via INTRAVENOUS

## 2015-12-23 MED ORDER — MIDAZOLAM HCL 2 MG/2ML IJ SOLN
INTRAMUSCULAR | Status: AC
  Filled 2015-12-23: qty 6

## 2015-12-23 MED ORDER — SODIUM CHLORIDE 0.9 % IV SOLN
INTRAVENOUS | Status: DC
  Administered 2015-12-23: 14:00:00 via INTRAVENOUS

## 2015-12-23 MED ORDER — FLUMAZENIL 0.5 MG/5ML IV SOLN
INTRAVENOUS | Status: AC
  Filled 2015-12-23: qty 5

## 2015-12-23 MED ORDER — NALOXONE HCL 0.4 MG/ML IJ SOLN
INTRAMUSCULAR | Status: AC
  Filled 2015-12-23: qty 1

## 2015-12-23 MED ORDER — FENTANYL CITRATE (PF) 100 MCG/2ML IJ SOLN
INTRAMUSCULAR | Status: AC
  Filled 2015-12-23: qty 4

## 2015-12-23 NOTE — Procedures (Signed)
Interventional Radiology Procedure Note  Procedure:  US guided core biopsy of left chest wall mass  Complications:  None  Estimated Blood Loss: < 10 mL  16 G core biopsy x 7 of large left chest wall mass.  Venetia Night. Kathlene Cote, M.D Pager:  [phone]

## 2015-12-23 NOTE — Discharge Instructions (Signed)
Needle Biopsy, Care After These instructions give you information about caring for yourself after your procedure. Your doctor may also give you more specific instructions. Call your doctor if you have any problems or questions after your procedure. HOME CARE  Rest as told by your doctor.  Take medicines only as told by your doctor.  There are many different ways to close and cover the biopsy site, including stitches (sutures), skin glue, and adhesive strips. Follow instructions from your doctor about:  How to take care of your biopsy site.  When and how you should change your bandage (dressing).  When you should remove your dressing.  Removing whatever was used to close your biopsy site.  Check your biopsy site every day for signs of infection. Watch for:  Redness, swelling, or pain.  Fluid, blood, or pus. GET HELP IF:  You have a fever.  You have redness, swelling, or pain at the biopsy site, and it lasts longer than a few days.  You have fluid, blood, or pus coming from the biopsy site.  You feel sick to your stomach (nauseous).  You throw up (vomit). GET HELP RIGHT AWAY IF:  You are short of breath.  You have trouble breathing.  Your chest hurts.  You feel dizzy or you pass out (faint).  You have bleeding that does not stop with pressure or a bandage.  You cough up blood.  Your belly (abdomen) hurts.   This information is not intended to replace advice given to you by your health care provider. Make sure you discuss any questions you have with your health care provider.   Document Released: 04/29/2008 Document Revised: 10/01/2014 Document Reviewed: 05/13/2014 Elsevier Interactive Patient Education 2016 Else[employer]. Moderate Conscious Sedation, Adult Sedation is the use of medicines to promote relaxation and relieve discomfort and anxiety. Moderate conscious sedation is a type of sedation. Under moderate conscious sedation you are less alert than normal but  are still able to respond to instructions or stimulation. Moderate conscious sedation is used during short medical and dental procedures. It is milder than deep sedation or general anesthesia and allows you to return to your regular activities sooner. LET [facility] CARE PROVIDER KNOW ABOUT:   Any allergies you have.  All medicines you are taking, including vitamins, herbs, eye drops, creams, and over-the-counter medicines.  Use of steroids (by mouth or creams).  Previous problems you or members of your family have had with the use of anesthetics.  Any blood disorders you have.  Previous surgeries you have had.  Medical conditions you have.  Possibility of pregnancy, if this applies.  Use of cigarettes, alcohol, or illegal drugs. RISKS AND COMPLICATIONS Generally, this is a safe procedure. However, as with any procedure, problems can occur. Possible problems include:  Oversedation.  Trouble breathing on your own. You may need to have a breathing tube until you are awake and breathing on your own.  Allergic reaction to any of the medicines used for the procedure. BEFORE THE PROCEDURE  You may have blood tests done. These tests can help show how well your kidneys and liver are working. They can also show how well your blood clots.  A physical exam will be done.  Only take medicines as directed by your health care provider. You may need to stop taking medicines (such as blood thinners, aspirin, or nonsteroidal anti-inflammatory drugs) before the procedure.   Do not eat or drink at least 6 hours before the procedure or as directed by  your health care provider.  Arrange for a responsible adult, family member, or friend to take you home after the procedure. He or she should stay with you for at least 24 hours after the procedure, until the medicine has worn off. PROCEDURE   An intravenous (IV) catheter will be inserted into one of your veins. Medicine will be able to flow  directly into your body through this catheter. You may be given medicine through this tube to help prevent pain and help you relax.  The medical or dental procedure will be done. AFTER THE PROCEDURE  You will stay in a recovery area until the medicine has worn off. Your blood pressure and pulse will be checked.   Depending on the procedure you had, you may be allowed to go home when you can tolerate liquids and your pain is under control.   This information is not intended to replace advice given to you by your health care provider. Make sure you discuss any questions you have with your health care provider.   Document Released: 02/09/2001 Document Revised: 06/07/2014 Document Reviewed: 01/22/2013 Elsevier Interactive Patient Education [employer].

## 2015-12-23 NOTE — Consult Note (Signed)
Chief Complaint: Patient was seen in consultation today for ultrasound-guided left chest wall mass biopsy  Referring Physician(s): Brunetta Genera  Supervising Physician: Aletta Edouard  Patient Status: Outpatient  History of Present Illness: Erik Vaughn is a 65 y.o. male , former long-term smoker, with history of hypertension, dyslipidemia, peripheral arterial disease and persistent/worsening low back pain with radiation to left lower extremity since May 2017. Patient has also had some difficulty controlling his bladder over the last several weeks. He has also noted a prominent left chest wall mass. MRI of the lumbar spine at outside facility on 12/13/15 revealed pathologic fractures of the left sacrum and S1 body. There is a partially visualized soft tissue mass anterior to the left sacral ala within the left L5/S1 and S1/S2 neural foramina. Ultrasound imaging of the left chest wall mass showed an irregular marginated hypoechoic mass measuring 7.3 cm in the left pectoral region. Recent chest x-ray revealed destructive lytic lesion of the ninth rib on the left posteriorly, abnormal chest wall density posteriorly with a mass measuring at least 5.7 cm and the anterior chest wall mass as mentioned before. He presents today for ultrasound-guided biopsy of the left chest wall mass.  Past Medical History:  Diagnosis Date  . Hypertension   . PAD (peripheral artery disease) (HCC) left leg    Past Surgical History:  Procedure Laterality Date  . BRAIN SURGERY    . left salivary Left    removed    Allergies: Review of patient's allergies indicates no known allergies.  Medications: Prior to Admission medications   Medication Sig Start Date End Date Taking? Authorizing Provider  amLODipine (NORVASC) 10 MG tablet Take 10 mg by mouth daily.  11/19/15  Yes Historical Provider, MD  aspirin 81 MG tablet Take 81 mg by mouth daily.   Yes Historical Provider, MD  Cholecalciferol (VITAMIN  D3) 2000 units TABS Take 2,000 Units by mouth daily.   Yes Historical Provider, MD  lisinopril (PRINIVIL,ZESTRIL) 20 MG tablet Take 20 mg by mouth daily.  11/19/15  Yes Historical Provider, MD  LORazepam (ATIVAN) 0.5 MG tablet Take 1 tablet (0.5 mg total) by mouth every 8 (eight) hours as needed for anxiety. Could take 2 tabs ('1mg'$ ) 30 mins prior to PET/CT scan 12/18/15  Yes Gautam Juleen China, MD  Multiple Vitamins-Minerals (CENTRUM SILVER PO) Take 1 tablet by mouth daily.    Yes Historical Provider, MD  Omega-3 Fatty Acids (FISH OIL) 1200 MG CAPS Take 3,600 mg by mouth daily.   Yes Historical Provider, MD  oxyCODONE (OXYCONTIN) 10 mg 12 hr tablet Take 1 tablet (10 mg total) by mouth every 12 (twelve) hours. Patient taking differently: Take 10 mg by mouth every 12 (twelve) hours. Takes two tablets every 12hrs 12/18/15  Yes Gautam Juleen China, MD  oxyCODONE-acetaminophen (PERCOCET/ROXICET) 5-325 MG tablet Take 1-2 tablets by mouth every 4 (four) hours as needed for severe pain. 12/18/15  Yes Brunetta Genera, MD  polyethylene glycol [facility]) packet Take 17 g by mouth daily. 12/18/15  Yes Brunetta Genera, MD  rosuvastatin (CRESTOR) 5 MG tablet Take 5 mg by mouth at bedtime.  11/19/15  Yes Historical Provider, MD  senna-docusate (SENNA S) 8.6-50 MG tablet Take 2 tablets by mouth at bedtime. 12/18/15  Yes Gautam Juleen China, MD     History reviewed. No pertinent family history.  Social History   Social History  . Marital status: Married    Spouse name: N/A  . Number of children: N/A  .  Years of education: N/A   Social History Main Topics  . Smoking status: Former Smoker    Packs/day: 2.00    Quit date: 12/22/2005  . Smokeless tobacco: Never Used  . Alcohol use Yes     Comment: occasionally  . Drug use: Unknown  . Sexual activity: Not Asked   Other Topics Concern  . None   Social History Narrative  . None     Review of Systems patient currently denies fever, headache, dyspnea,  cough, dominant pain, vomiting or abnormal bleeding. He has had weight loss, back pain with radiation to the left lower extremity, urinary incontinence  difficul,  Vital Signs: BP (!) 142/70   Pulse 82   Temp 98.3 F (36.8 C) (Oral)   Resp 18   Ht '6\' 1"'$  (1.854 m)   Wt 187 lb 6 oz (85 kg)   SpO2 98%   BMI 24.72 kg/m   Physical Exam patient awake/alert, hard of hearing. Chest clear to auscultation bilaterally. Noted left anterior and posterior chest wall masses,NT ; heart with regular rate and rhythm. Abdomen soft, positive bowel sounds, nontender. Extremities with no significant edema.   Mallampati Score:     Imaging: Dg Chest 2 View  Result Date: 12/18/2015 CLINICAL DATA:  Metastatic cancer.  Left chest wall mass. EXAM: CHEST  2 VIEW COMPARISON:  None. FINDINGS: Heart size is normal. There is aortic atherosclerosis. No abnormality is seen in the right chest. On the frontal view, there is abnormal density projected over the left lower chest. There is lytic destruction of the left ninth rib posteriorly. There is a posterior chest wall mass measuring at least 5.7 cm in diameter seen on the lateral. Additionally, there is in the anterior retrosternal density on the lateral that could also be a chest wall mass anteriorly. No effusions. No spinal fracture seen. IMPRESSION: Lytic destructive lesion of the ninth rib on the left posteriorly. Abnormal chest wall density posteriorly with a mass measuring at least 5.7 cm. Possible anterior chest wall mass as well. Electronically Signed   By: Nelson Chimes M.D.   On: 12/18/2015 13:59   Mr [facility] [employer] Spine  Result Date: 12/18/2015 CLINICAL DATA:  This exam is stored here for comparison purposes only and was performed at an outside facility.   Please contact the originating institution for any associated interpretation or report.    Labs:  CBC:  Recent Labs  12/15/15 1645 12/18/15 1330 12/23/15 1320  WBC 5.6 8.8 6.6  HGB 11.9* 12.6* 11.6*    HCT 37.0* 38.3* 35.5*  PLT 288 324 357    COAGS:  Recent Labs  12/23/15 1320  INR 1.17  APTT 31    BMP:  Recent Labs  12/15/15 1645 12/18/15 1331  NA 136 140  K 3.8 4.3  CL 100*  --   CO2 28 28  GLUCOSE 103* 116  BUN 15 20.8  CALCIUM 9.9 10.5*  CREATININE 0.83 1.0  GFRNONAA >60  --   GFRAA >60  --     LIVER FUNCTION TESTS:  Recent Labs  12/15/15 1645 12/18/15 1330 12/18/15 1331  BILITOT 0.9  --  0.66  AST 29  --  27  ALT 25  --  25  ALKPHOS 123  --  138  PROT 7.5 7.5 8.2  ALBUMIN 4.2  --  4.2    TUMOR MARKERS: No results for input(s): AFPTM, CEA, CA199, CHROMGRNA in the last 8760 hours.  Assessment and Plan: 66 y.o. male , former  long-term smoker, with history of hypertension, dyslipidemia, peripheral arterial disease and persistent/worsening low back pain with radiation to left lower extremity since May 2017. Patient has also had some difficulty controlling his bladder over the last several weeks. He has also noted a prominent left chest wall mass. MRI of the lumbar spine at outside facility on 12/13/15 revealed pathologic fractures of the left sacrum and S1 body. There is a partially visualized soft tissue mass anterior to the left sacral ala within the left L5/S1 and S1/S2 neural foramina. Ultrasound imaging of the left chest wall mass showed an irregular marginated hypoechoic mass measuring 7.3 cm in the left pectoral region. Recent chest x-ray revealed destructive lytic lesion of the ninth rib on the left posteriorly, abnormal chest wall density posteriorly with a mass measuring at least 5.7 cm and the anterior chest wall mass as mentioned before. He presents today for ultrasound-guided biopsy of the left chest wall mass.Risks and benefits discussed with the patient/family including, but not limited to bleeding, infection, damage to adjacent structures or low yield requiring additional tests.All of the patient's questions were answered, patient is agreeable to  proceed.Consent signed and in chart.     Thank you for this interesting consult.  I greatly enjoyed meeting Nollie Terlizzi and look forward to participating in their care.  A copy of this report was sent to the requesting provider on this date.  Electronically Signed: D. Rowe Robert 12/23/2015, 2:39 PM   I spent a total of 25 minutes  in face to face in clinical consultation, greater than 50% of which was counseling/coordinating care for left chest wall mass biopsy

## 2015-12-24 ENCOUNTER — Telehealth: Payer: Self-pay | Admitting: Hematology

## 2015-12-24 NOTE — Telephone Encounter (Signed)
cld to get pt appt time for 7/31-gave her9:30 appt time

## 2015-12-25 ENCOUNTER — Ambulatory Visit (HOSPITAL_BASED_OUTPATIENT_CLINIC_OR_DEPARTMENT_OTHER): Payer: PRIVATE HEALTH INSURANCE | Admitting: Hematology

## 2015-12-25 ENCOUNTER — Encounter (HOSPITAL_COMMUNITY)
Admission: RE | Admit: 2015-12-25 | Discharge: 2015-12-25 | Disposition: A | Discharge status: Home / Self Care | Payer: PRIVATE HEALTH INSURANCE | Source: Ambulatory Visit | Attending: Hematology | Admitting: Hematology

## 2015-12-25 ENCOUNTER — Telehealth: Payer: Self-pay | Admitting: Hematology

## 2015-12-25 ENCOUNTER — Encounter: Payer: Self-pay | Admitting: Hematology

## 2015-12-25 ENCOUNTER — Other Ambulatory Visit (HOSPITAL_BASED_OUTPATIENT_CLINIC_OR_DEPARTMENT_OTHER): Payer: PRIVATE HEALTH INSURANCE

## 2015-12-25 ENCOUNTER — Other Ambulatory Visit: Payer: Self-pay | Admitting: *Deleted

## 2015-12-25 VITALS — BP 148/75 | HR 90 | Temp 98.7°F | Resp 18 | Ht 73.0 in | Wt 187.3 lb

## 2015-12-25 DIAGNOSIS — C8519 Unspecified B-cell lymphoma, extranodal and solid organ sites: Secondary | ICD-10-CM | POA: Diagnosis not present

## 2015-12-25 DIAGNOSIS — C8378 Burkitt lymphoma, lymph nodes of multiple sites: Secondary | ICD-10-CM

## 2015-12-25 DIAGNOSIS — C8338 Diffuse large B-cell lymphoma, lymph nodes of multiple sites: Secondary | ICD-10-CM

## 2015-12-25 DIAGNOSIS — C7951 Secondary malignant neoplasm of bone: Secondary | ICD-10-CM

## 2015-12-25 DIAGNOSIS — I10 Essential (primary) hypertension: Secondary | ICD-10-CM | POA: Diagnosis not present

## 2015-12-25 DIAGNOSIS — G893 Neoplasm related pain (acute) (chronic): Secondary | ICD-10-CM | POA: Diagnosis not present

## 2015-12-25 DIAGNOSIS — C801 Malignant (primary) neoplasm, unspecified: Secondary | ICD-10-CM | POA: Diagnosis not present

## 2015-12-25 HISTORY — DX: Burkitt lymphoma, lymph nodes of multiple sites: C83.78

## 2015-12-25 LAB — COMPREHENSIVE METABOLIC PANEL
ALK PHOS: 110 U/L (ref 40–150)
ALT: 19 U/L (ref 0–55)
ANION GAP: 11 meq/L (ref 3–11)
AST: 20 U/L (ref 5–34)
Albumin: 3.6 g/dL (ref 3.5–5.0)
BILIRUBIN TOTAL: 0.39 mg/dL (ref 0.20–1.20)
BUN: 15.9 mg/dL (ref 7.0–26.0)
CALCIUM: 10.3 mg/dL (ref 8.4–10.4)
CHLORIDE: 97 meq/L — AB (ref 98–109)
CO2: 29 meq/L (ref 22–29)
CREATININE: 0.8 mg/dL (ref 0.7–1.3)
EGFR: >90 mL/min/{1.73_m2} (ref >90)
Glucose: 135 mg/dl (ref 70–140)
POTASSIUM: 4.1 meq/L (ref 3.5–5.1)
Sodium: 137 mEq/L (ref 136–145)
Total Protein: 7.5 g/dL (ref 6.4–8.3)

## 2015-12-25 LAB — CBC & DIFF AND RETIC
BASO%: 0.9 % (ref 0.0–2.0)
BASOS ABS: 0.1 10*3/uL (ref 0.0–0.1)
EOS%: 2.8 % (ref 0.0–7.0)
Eosinophils Absolute: 0.2 10*3/uL (ref 0.0–0.5)
HEMATOCRIT: 36.2 % — AB (ref 38.4–49.9)
HGB: 11.9 g/dL — ABNORMAL LOW (ref 13.0–17.1)
Immature Retic Fract: 4.9 % (ref 3.00–10.60)
LYMPH#: 1.8 10*3/uL (ref 0.9–3.3)
LYMPH%: 27.5 % (ref 14.0–49.0)
MCH: 27.4 pg (ref 27.2–33.4)
MCHC: 32.9 g/dL (ref 32.0–36.0)
MCV: 83.2 fL (ref 79.3–98.0)
MONO#: 0.7 10*3/uL (ref 0.1–0.9)
MONO%: 11.1 % (ref 0.0–14.0)
NEUT#: 3.7 10*3/uL (ref 1.5–6.5)
NEUT%: 57.7 % (ref 39.0–75.0)
PLATELETS: 381 10*3/uL (ref 140–400)
RBC: 4.35 10*6/uL (ref 4.20–5.82)
RDW: 14 % (ref 11.0–14.6)
RETIC %: 1.74 % (ref 0.80–1.80)
Retic Ct Abs: 75.69 10*3/uL (ref 34.80–93.90)
WBC: 6.4 10*3/uL (ref 4.0–10.3)

## 2015-12-25 LAB — URIC ACID: Uric Acid, Serum: 7.4 mg/dl (ref 2.6–7.4)

## 2015-12-25 LAB — GLUCOSE, CAPILLARY: Glucose-Capillary: 123 mg/dL — ABNORMAL HIGH (ref 65–99)

## 2015-12-25 LAB — LACTATE DEHYDROGENASE: LDH: 219 U/L (ref 125–245)

## 2015-12-25 MED ORDER — DEXAMETHASONE 4 MG PO TABS: 4.0000 mg | ORAL_TABLET | Freq: Two times a day (BID) | ORAL | 0 refills | Status: DC

## 2015-12-25 MED ORDER — ALLOPURINOL 300 MG PO TABS: 300.0000 mg | ORAL_TABLET | Freq: Every day | ORAL | 0 refills | Status: DC

## 2015-12-25 MED ORDER — FLUDEOXYGLUCOSE F - 18 (FDG) INJECTION
9.2900 | Freq: Once | INTRAVENOUS | Status: AC | PRN
  Administered 2015-12-25: 9.29 via INTRAVENOUS

## 2015-12-25 NOTE — Telephone Encounter (Signed)
Gave pt cal & avs, waiting on echo auth

## 2015-12-25 NOTE — Telephone Encounter (Signed)
spoke w/ dauighter confirmed echo for 7/28

## 2015-12-26 ENCOUNTER — Other Ambulatory Visit: Payer: Self-pay | Admitting: General Surgery

## 2015-12-26 ENCOUNTER — Ambulatory Visit (HOSPITAL_COMMUNITY)
Admission: RE | Admit: 2015-12-26 | Discharge: 2015-12-26 | Disposition: A | Discharge status: Home / Self Care | Payer: PRIVATE HEALTH INSURANCE | Source: Ambulatory Visit | Attending: Hematology | Admitting: Hematology

## 2015-12-26 ENCOUNTER — Other Ambulatory Visit: Payer: Self-pay | Admitting: Radiology

## 2015-12-26 DIAGNOSIS — Z720 Tobacco use: Secondary | ICD-10-CM | POA: Diagnosis not present

## 2015-12-26 DIAGNOSIS — E785 Hyperlipidemia, unspecified: Secondary | ICD-10-CM | POA: Diagnosis not present

## 2015-12-26 DIAGNOSIS — C8338 Diffuse large B-cell lymphoma, lymph nodes of multiple sites: Secondary | ICD-10-CM | POA: Insufficient documentation

## 2015-12-26 DIAGNOSIS — I10 Essential (primary) hypertension: Secondary | ICD-10-CM | POA: Diagnosis not present

## 2015-12-26 DIAGNOSIS — I059 Rheumatic mitral valve disease, unspecified: Secondary | ICD-10-CM | POA: Insufficient documentation

## 2015-12-26 DIAGNOSIS — Z09 Encounter for follow-up examination after completed treatment for conditions other than malignant neoplasm: Secondary | ICD-10-CM | POA: Diagnosis present

## 2015-12-26 LAB — HIV ANTIBODY (ROUTINE TESTING W REFLEX): HIV Screen 4th Generation wRfx: NONREACTIVE

## 2015-12-26 LAB — HEPATITIS B CORE ANTIBODY, TOTAL: Hep B Core Ab, Tot: NEGATIVE

## 2015-12-26 LAB — HEPATITIS B SURFACE ANTIGEN: HBsAg Screen: NEGATIVE

## 2015-12-26 NOTE — Progress Notes (Signed)
  Echocardiogram 2D Echocardiogram has been performed.  Erik Vaughn M 12/26/2015, 10:47 AM

## 2015-12-28 NOTE — Progress Notes (Signed)
[name]    HEMATOLOGY/ONCOLOGY CLINIC NOTE  Date of Service: 12/25/2015  Patient Care Team: Ernestene Kiel, MD as PCP - General (Internal Medicine) [name], MD as Consulting Physician (Orthopedic Surgery)  CHIEF COMPLAINTS/PURPOSE OF CONSULTATION:  Newly diagnosed Metastatic Malignancy  HISTORY OF PRESENTING ILLNESS:   Erik Vaughn is a wonderful 65 y.o. male who has been referred to [place] by Dr  Tonita Cong for evaluation and management of Newly diagnosed metastatic malignancy.  Patient is a history of hypertension, dyslipidemia, peripheral arterial disease but otherwise has been in good health overall.  Was in ex-smoker with a 80 pack year history of smoking quit about 8-9 years ago. Patient presented to Dr. Tonita Cong (orthopedics) with worsening lower back pain especially in the left lower back radiating into the left leg and foot since May 2017.  Patient notes that the pain has been progressive ill-defined that he cannot sit for a length of time nor any sleep comfortably on his back.   He was recently prescribed Percocet which he notes has helped.  He also reports that he has been having some difficulties controlling his bladder and has been losing control of his urine for the Last several weeks to a month. No overt lower extremity weakness.  Over the last 3 weeks the patient has also noted a prominent left chest wall mass. He reports having lost about 22 pounds in the last several months.  He feels a part of that might be intentional since he was trying to lose weight. He reports that he also initially had left lower extremity swelling and had an ultrasound of the lower extremity on 12/12/2015 that showed no evidence of DVT.  His lower extremity swelling has since improved.  Patient had an MRI of the lumbar spine with his orthopedic doctor on 12/13/2015 which showed pathologic fractures of the left sacrum ala and S1 body with diffuse marrow edema.  She is a partially visualized soft tissue mass  anterior to the left sacral ala within the left L5-S1 and S1-S2 neural foramina. Neoplastic marrow infiltration also is noted in the left posterior iilium. Primary concern is metastatic disease.  Myeloma and lymphoma are also in the differential diagnosis. He was also noted to have multilevel degenerative changes in the lumbar spine but no neural displacement or encroachment.  A PSA level was done which was noted to be within normal limits.  Patient had an ultrasound of his left chest wall mass which showed an irregular marginated hypoechoic mass measuring 6.3 x 3.7 x 7.3 cm in the left pectoral region.  It appears to lie within or deep to the pectoralis musculature.  It is nontender and appears fixed.   Patient presents today for further evaluation and management with his daughter who is a Marine scientist and his wife.  He had both of his been using about 3-4 Percocets a day to try to help control his lower back pain. He had a chest x-ray today which showed a destructive lytic lesion of the ninth rib on the left posteriorly, abnormal chest wall density posteriorly with the mass measuring at least 5.7 cm and an anterior chest wall mass as well.  No overt shortness of breath or chest pain.  No headaches.  No focal neurological deficits.  INTERVAL HISTORY  Erik Vaughn is here for a follow-up with his daughter who is a Marine scientist in [facility]. He had a PET CT scan and an ultrasound-guided biopsy of his left chest wall mass. We discussed the biopsy results  which show an aggressive large B-cell lymphoma with a very high Ki-67 score. We discussed that this appears to be an aggressive diffuse large B-cell lymphoma versus Burkitt's lymphoma. Patient has no focal neurological deficits or symptoms of CNS involvement at this time. His predominant symptom appears to be low back pain for which he had a ER visit and his OxyContin was increased to 20 mg twice a day and he notes that this is reasonably controlling  his pain right now. No other acute new symptoms. Has been trying to manage his constipation at home.  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   He smokes about 2 packs a day for 40 years.  SURGICAL HISTORY:  #1 left submandibular salivary gland benign tumor excision  SOCIAL HISTORY: Social History   Social History  . Marital status: Married    Spouse name: N/A  . Number of children: N/A  . Years of education: N/A   Occupational History  . Not on file.   Social History Main Topics  . Smoking status: Former Smoker    Packs/day: 2.00    Quit date: 12/22/2005  . Smokeless tobacco: Never Used  . Alcohol use Yes     Comment: occasionally  . Drug use: Unknown  . Sexual activity: Not on file   Other Topics Concern  . Not on file   Social History Narrative  . No narrative on file  smoked 2 packs of cigarettes per day for 40 years quit about 8-9 years ago. Denies significant alcohol use. 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  FAMILY HISTORY:  Sister had breast cancer at age 76 years. Unknown if she had genetic testing One maternal uncle had melanoma Second maternal uncle had pancreatic cancer under the age of 13yrPaternal uncle with prostate cancer Dad had prostate cancer at age 3242years   ALLERGIES:  has No Known Allergies.  MEDICATIONS:  Current Outpatient Prescriptions  Medication Sig Dispense Refill  . Docusate Sodium (COLACE PO) Take by mouth.    .[name] Kitchenallopurinol (ZYLOPRIM) 300 MG tablet Take 1 tablet (300 mg total) by mouth daily. 60 tablet 0  . amLODipine (NORVASC) 10 MG tablet Take 10 mg by mouth daily.      .[name] Kitchenaspirin 81 MG tablet Take 81 mg by mouth daily.    . Cholecalciferol (VITAMIN D3) 2000 units TABS Take 2,000 Units by mouth daily.    .[name] Kitchendexamethasone (DECADRON) 4 MG tablet Take 1 tablet (4 mg total) by mouth 2 (two) times daily with breakfast and lunch. 30 tablet 0  . lisinopril (PRINIVIL,ZESTRIL) 20 MG tablet Take 20 mg by mouth daily.     .[name] KitchenLORazepam (ATIVAN) 0.5 MG tablet Take 1 tablet (0.5 mg total) by mouth every 8 (eight) hours as needed for anxiety. Could take 2 tabs (18m 30 mins prior to PET/CT scan 20 tablet 0  . Multiple Vitamins-Minerals (CENTRUM SILVER PO) Take 1 tablet by mouth daily.     . Omega-3 Fatty Acids (FISH OIL) 1200 MG CAPS Take 3,600 mg by mouth daily.    . [name] KitchenxyCODONE (OXYCONTIN) 10 mg 12 hr tablet Take 1 tablet (10 mg total) by mouth  every 12 (twelve) hours. (Patient taking differently: Take 20 mg by mouth every 12 (twelve) hours. Takes two tablets every 12hrs) 60 tablet 0  . oxyCODONE-acetaminophen (PERCOCET/ROXICET) 5-325 MG tablet Take 1-2 tablets by mouth every 4 (four) hours as needed for severe pain. 60 tablet 0  . polyethylene glycol (MIRALAX) packet Take 17 g by mouth daily. (Patient taking differently: Take 17 g by mouth 2 (two) times daily. ) 30 each 1  . rosuvastatin (CRESTOR) 5 MG tablet Take 5 mg by mouth at bedtime.     . senna-docusate (SENNA S) 8.6-50 MG tablet Take 2 tablets by mouth at bedtime. 60 tablet 1   No current facility-administered medications for this visit.     REVIEW OF SYSTEMS:    10 Point review of Systems was done is negative except as noted above.  PHYSICAL EXAMINATION: ECOG PERFORMANCE STATUS: 1 - Symptomatic but completely ambulatory  . Vitals:   12/25/15 1102  BP: (!) 148/75  Pulse: 90  Resp: 18  Temp: 98.7 F (37.1 C)   Filed Weights   12/25/15 1102  Weight: 187 lb 4.8 oz (85 kg)   .Body mass index is 24.71 kg/m.  GENERAL:alert, in no acute distress and comfortable SKIN: skin color, texture, turgor are  normal, no rashes or significant lesions EYES: normal, conjunctiva are pink and non-injected, sclera clear OROPHARYNX:no exudate, no erythema and lips, buccal mucosa, and tongue normal  NECK: supple, no JVD, thyroid normal size, non-tender, without nodularity LYMPH:  no palpable lymphadenopathy in the cervical, axillary or inguinal LUNGS: clear to auscultation with normal respiratory effort, large left anterior chest wall mass noted appears nontender and fixed. HEART: regular rate & rhythm,  no murmurs and no lower extremity edema ABDOMEN: abdomen soft, non-tender, normoactive bowel sounds  Musculoskeletal: no cyanosis of digits and no clubbing  PSYCH: alert & oriented x 3 with fluent speech NEURO: no focal motor/sensory deficits  LABORATORY DATA:  I have reviewed the data as listed  . CBC Latest Ref Rng & Units 12/25/2015 12/23/2015 12/18/2015  WBC 4.0 - 10.3 10e3/uL 6.4 6.6 8.8  Hemoglobin 13.0 - 17.1 g/dL 11.9(L) 11.6(L) 12.6(L)  Hematocrit 38.4 - 49.9 % 36.2(L) 35.5(L) 38.3(L)  Platelets 140 - 400 10e3/uL 381 357 324   . CBC    Component Value Date/Time   WBC 6.4 12/25/2015 1028   WBC 6.6 12/23/2015 1320   RBC 4.35 12/25/2015 1028   RBC 4.23 12/23/2015 1320   HGB 11.9 (L) 12/25/2015 1028   HCT 36.2 (L) 12/25/2015 1028   PLT 381 12/25/2015 1028   MCV 83.2 12/25/2015 1028   MCH 27.4 12/25/2015 1028   MCH 27.4 12/23/2015 1320   MCHC 32.9 12/25/2015 1028   MCHC 32.7 12/23/2015 1320   RDW 14.0 12/25/2015 1028   LYMPHSABS 1.8 12/25/2015 1028   MONOABS 0.7 12/25/2015 1028   EOSABS 0.2 12/25/2015 1028   BASOSABS 0.1 12/25/2015 1028   . Lab Results  Component Value Date   LDH 219 12/25/2015   . CMP Latest Ref Rng & Units 12/25/2015 12/18/2015 12/18/2015  Glucose 70 - 140 mg/dl 135 116 -  BUN 7.0 - 26.0 mg/dL 15.9 20.8 -  Creatinine 0.7 - 1.3 mg/dL 0.8 1.0 -  Sodium 136 - 145 mEq/L 137 140 -  Potassium 3.5 - 5.1 mEq/L 4.1 4.3 -  Chloride 101 - 111 mmol/L - - -  CO2 22 -  29 mEq/L 29 28 -  Calcium 8.4 - 10.4 mg/dL 10.3 10.5(H) -  Total Protein 6.4 - 8.3 g/dL 7.5 8.2 7.5  Total Bilirubin 0.20 - 1.20 mg/dL 0.39 0.66 -  Alkaline Phos 40 - 150 U/L 110 138 -  AST 5 - 34 U/L 20 27 -  ALT 0 - 55 U/L 19 25 -   Component     Latest Ref Rng & Units 12/25/2015  LDH     125 - 245 U/L 219  Uric Acid, Serum     2.6 - 7.4 mg/dl 7.4  Hep B Core Ab, Tot     Negative Negative  Hepatitis B Surface Ag     Negative Negative  HIV     Non Reactive Non Reactive      RADIOGRAPHIC STUDIES: I have personally reviewed the radiological images as listed and agreed with the findings in the report. Dg Chest 2 View  Result Date: 12/18/2015 CLINICAL DATA:  Metastatic cancer.  Left chest wall mass. EXAM: CHEST  2 VIEW COMPARISON:  None. FINDINGS: Heart size is normal. There is aortic atherosclerosis. No abnormality is seen in the right chest. On the frontal view, there is abnormal density projected over the left lower chest. There is lytic destruction of the left ninth rib posteriorly. There is a posterior chest wall mass measuring at least 5.7 cm in diameter seen on the lateral. Additionally, there is in the anterior retrosternal density on the lateral that could also be a chest wall mass anteriorly. No effusions. No spinal fracture seen. IMPRESSION: Lytic destructive lesion of the ninth rib on the left posteriorly. Abnormal chest wall density posteriorly with a mass measuring at least 5.7 cm. Possible anterior chest wall mass as well. Electronically Signed   By: Nelson Chimes M.D.   On: 12/18/2015 13:59   Nm Pet Image Initial (pi) Skull Base To Thigh  Result Date: 12/25/2015 CLINICAL DATA:  Initial treatment strategy for diffuse large B-cell lymphoma with osseous metastases. EXAM: NUCLEAR MEDICINE PET SKULL BASE TO THIGH TECHNIQUE: 9.29 mCi F-18 FDG was injected intravenously. Full-ring PET imaging was performed from the skull base to thigh after the radiotracer. CT data was obtained  and used for attenuation correction and anatomic localization. FASTING BLOOD GLUCOSE:  Value: 123 mg/dl COMPARISON:  None. FINDINGS: NECK No hypermetabolic lymph nodes in the neck. CHEST 13 x 11 mm posterior right upper lobe/ right perihilar nodule (series 8/ image 25), max SUV 18.4, suspicious for pulmonary metastasis. Trace left pleural effusion. 2.3 x 1.9 cm left paratracheal node (series 4/ image 57), max SUV 36.8. Additional 8 mm short axis left infrahilar node (series 4/ image 80), max SUV 13.0. Additional small mediastinal lymph nodes, although without appreciable hypermetabolism. Three vessel coronary atherosclerosis. Mild atherosclerotic calcifications of the abdominal aorta. ABDOMEN/PELVIS No abnormal hypermetabolic activity within the liver, pancreas, adrenal glands, or spleen. 7.1 x 10.6 cm aggregate nodal mass in the right mid abdomen (series 4/ image 147), max SUV 41.5. 6.4 x 5.3 cm left external iliac nodal mass (series 4/image 137), max SUV 38.7. Additional foci of hypermetabolism along the right anterior abdominal wall and right abdominal mesentery. Prostatomegaly, with enlargement of the central gland which indents the base of the bladder. Mildly thick-walled bladder. Atherosclerotic calcifications the abdominal aorta and branch vessels. SKELETON 4.9 x 6.8 cm left anterior chest wall metastasis involving the right anterior 2nd and 3rd rib (series 4/ image 80), max SUV 37.2. 4.8 x 5.9 cm left posterior chest wall metastasis (series 4/ image 91), with associated pathologic fracture of the left posterior 9th rib, max  SUV 48.6. Left sacral metastasis (series 4/ image 163), max SUV 31.1. Additional hypermetabolism involving the left acetabulum and iliac bone. Right proximal humeral metastasis, max SUV 39.9. IMPRESSION: Widespread lymphoma in the chest, abdomen, and pelvis, as above. 13 mm posterior right upper lobe pulmonary nodule, suspicious for pulmonary metastasis. Upper thoracic and left perihilar  nodal lymphoma. Large nodal masses in the right mid abdomen and left pelvis. Osseous metastases involving the left anterior and left posterior chest wall, with pathologic fracture of the left posterior 9th rib. Additional osseous metastases involving the left sacral and right proximal humerus. Additional ancillary findings as above. Electronically Signed   By: Julian Hy M.D.   On: 12/25/2015 10:53  US Biopsy  Result Date: 12/23/2015 CLINICAL DATA:  Large mass of the left chest wall as well as destructive lesion of the left ninth rib and sacral mass. The patient presents for biopsy of the chest wall mass. EXAM: ULTRASOUND GUIDED CORE BIOPSY OF LEFT CHEST WALL MASS MEDICATIONS: 1.0 mg IV Versed; 50 mcg IV Fentanyl Total Moderate Sedation Time: 9 minutes. The patient's level of consciousness and physiologic status were continuously monitored during the procedure by Radiology nursing. PROCEDURE: The procedure, risks, benefits, and alternatives were explained to the patient. Questions regarding the procedure were encouraged and answered. The patient understands and consents to the procedure. A time-out was performed prior to initiating the procedure. The left chest wall was prepped with chlorhexidine in a sterile fashion, and a sterile drape was applied covering the operative field. A sterile gown and sterile gloves were used for the procedure. Local anesthesia was provided with 1% Lidocaine. A left chest wall mass was localized by ultrasound. Seven separate 16 gauge core biopsy samples were obtained and submitted in both saline and formalin. Post biopsy imaging was performed with ultrasound. COMPLICATIONS: None. FINDINGS: Large hypoechoic, solid oval shaped left chest wall mass is identified in the left pectoral region. Solid tissue was obtained. IMPRESSION: Ultrasound-guided core biopsy performed of a large left chest wall mass. Electronically Signed   By: Aletta Edouard M.D.   On: 12/23/2015 16:50  Mr  Outside Films Spine  Result Date: 12/18/2015 CLINICAL DATA:  This exam is stored here for comparison purposes only and was performed at an outside facility.   Please contact the originating institution for any associated interpretation or report.    ASSESSMENT & PLAN:   65 year old Caucasian male with  #1 Stage IVB aggressive large B-cell lymphoma - likely doubled hit DLBCL vs B-cell lymp intermediate between DLBCL and Burkitt's and cannot r/o but less likely Burkitt's lymphoma (normal LDH today) This appears to be involving the sacrum causing sacral and root involvement with possible neurogenic bladder. PET CT scan as noted above shows extensive involvement with lymphoma. Patient has no focal neurological symptoms suggestive of CNS involvement however he would be considered high risk given the extent of his lymphoma and involvement of the spine.  Plan -Fish panel for BCL-2, BCL 6, cMYC , t(8:14) and other Burkett variants- currently pending.  -We discussed the diagnosis, prognosis, treatment options in details . -I recommended we start treating him with R_EPOCH after getting an echocardiogram . If this turns out to be Burkitt's lymphoma on Fish panel they could make it decision to consider a second opinion at the academic center to consider potentially more aggressive ALL -like regimen for treatment if they so desire . -Will need to discuss pros and cons of CNS prophylaxis based on lumbar puncture results. -Echo  -HIV  and hepatitis B profile negative  -Lumbar puncture to rule out CNS involvement . -Started on allopurinol for tumor lysis syndrome prophylaxis . -Chemotherapy counseling . -Patient and daughter wanted to think about it a little bit more but if they're agreeable we shall plan to admit them sometime midweek next week to start EPOCH-R -we started him on dexamethasone 4 mg twice a day for pain control due to lymphoma .  #2 left sacral ala and S1 fracture. L5-S1 and S1-S2  left-sided nerve root compression. #3 neoplasm related pain Plan continue OxyContin 20 mg by mouth twice a day and the Ativan and Percocet for breakthrough pain. -Started on dexamethasone 4 mg by mouth twice a day to help with control of bone pain . -he was given a prescription for SI joint brace and help with pain control.  #4 Hypertension, dyslipidemia, peripheral arterial disease -Continue management as per primary care physician.  #5 ex-smoker with 80-pack-year history of smoking.  #6 cancer screening - notes that he had his last colonoscopy about 10 years ago and was found to have some benign polyps.  Has never had a CT of the chest for lung cancer screening.   Recent PSA was normal limits.  All of the patients and his daughters questions were answered in details to their apparent satisfaction. The patient knows to call the clinic with any problems, questions or concerns.  I spent 40 minutes counseling the patient face to face. The total time spent in the appointment was 40 minutes and more than 50% was on counseling and direct patient cares.    Sullivan Lone MD [place] AAHIVMS [facility]ematology/Oncology Physician [facility]  (Office):       [phone] (Work cell):  [phone] (Fax):           [phone]

## 2015-12-29 ENCOUNTER — Other Ambulatory Visit (HOSPITAL_COMMUNITY): Payer: PRIVATE HEALTH INSURANCE

## 2015-12-29 ENCOUNTER — Encounter: Payer: Self-pay | Admitting: *Deleted

## 2015-12-29 ENCOUNTER — Ambulatory Visit (HOSPITAL_COMMUNITY): Payer: PRIVATE HEALTH INSURANCE

## 2015-12-29 ENCOUNTER — Ambulatory Visit: Payer: PRIVATE HEALTH INSURANCE | Admitting: Hematology

## 2015-12-29 ENCOUNTER — Other Ambulatory Visit: Payer: PRIVATE HEALTH INSURANCE

## 2015-12-30 ENCOUNTER — Other Ambulatory Visit: Payer: Self-pay | Admitting: Hematology

## 2015-12-30 ENCOUNTER — Other Ambulatory Visit: Payer: Self-pay | Admitting: Radiology

## 2015-12-30 ENCOUNTER — Other Ambulatory Visit: Payer: Self-pay | Admitting: General Surgery

## 2015-12-30 ENCOUNTER — Ambulatory Visit (HOSPITAL_COMMUNITY): Payer: PRIVATE HEALTH INSURANCE

## 2015-12-30 MED ORDER — OXYCODONE HCL ER 10 MG PO T12A: 20.0000 mg | EXTENDED_RELEASE_TABLET | Freq: Two times a day (BID) | ORAL | 0 refills | Status: DC

## 2015-12-31 ENCOUNTER — Encounter (HOSPITAL_COMMUNITY): Payer: Self-pay

## 2015-12-31 ENCOUNTER — Ambulatory Visit (HOSPITAL_COMMUNITY)
Admission: RE | Admit: 2015-12-31 | Discharge: 2015-12-31 | Disposition: A | Discharge status: Home / Self Care | Payer: PRIVATE HEALTH INSURANCE | Source: Ambulatory Visit | Attending: Hematology | Admitting: Hematology

## 2015-12-31 ENCOUNTER — Other Ambulatory Visit: Payer: Self-pay | Admitting: Hematology

## 2015-12-31 DIAGNOSIS — Z87891 Personal history of nicotine dependence: Secondary | ICD-10-CM | POA: Diagnosis not present

## 2015-12-31 DIAGNOSIS — R718 Other abnormality of red blood cells: Secondary | ICD-10-CM | POA: Diagnosis not present

## 2015-12-31 DIAGNOSIS — C8338 Diffuse large B-cell lymphoma, lymph nodes of multiple sites: Secondary | ICD-10-CM

## 2015-12-31 DIAGNOSIS — Z7982 Long term (current) use of aspirin: Secondary | ICD-10-CM | POA: Insufficient documentation

## 2015-12-31 DIAGNOSIS — M545 Low back pain: Secondary | ICD-10-CM | POA: Diagnosis not present

## 2015-12-31 DIAGNOSIS — I10 Essential (primary) hypertension: Secondary | ICD-10-CM | POA: Diagnosis not present

## 2015-12-31 DIAGNOSIS — I739 Peripheral vascular disease, unspecified: Secondary | ICD-10-CM | POA: Diagnosis not present

## 2015-12-31 DIAGNOSIS — E785 Hyperlipidemia, unspecified: Secondary | ICD-10-CM | POA: Diagnosis not present

## 2015-12-31 DIAGNOSIS — D649 Anemia, unspecified: Secondary | ICD-10-CM | POA: Diagnosis not present

## 2015-12-31 DIAGNOSIS — D7589 Other specified diseases of blood and blood-forming organs: Secondary | ICD-10-CM | POA: Insufficient documentation

## 2015-12-31 HISTORY — PX: IR GENERIC HISTORICAL: IMG1180011

## 2015-12-31 LAB — CBC WITH DIFFERENTIAL/PLATELET
BASOS ABS: 0 10*3/uL (ref 0.0–0.1)
BASOS PCT: 0 %
Eosinophils Absolute: 0.2 10*3/uL (ref 0.0–0.7)
Eosinophils Relative: 2 %
HEMATOCRIT: 35.5 % — AB (ref 39.0–52.0)
HEMOGLOBIN: 11.8 g/dL — AB (ref 13.0–17.0)
LYMPHS PCT: 22 %
Lymphs Abs: 3.1 10*3/uL (ref 0.7–4.0)
MCH: 27.4 pg (ref 26.0–34.0)
MCHC: 33.2 g/dL (ref 30.0–36.0)
MCV: 82.6 fL (ref 78.0–100.0)
Monocytes Absolute: 1.4 10*3/uL — ABNORMAL HIGH (ref 0.1–1.0)
Monocytes Relative: 10 %
NEUTROS ABS: 9.7 10*3/uL — AB (ref 1.7–7.7)
NEUTROS PCT: 66 %
Platelets: 440 10*3/uL — ABNORMAL HIGH (ref 150–400)
RBC: 4.3 MIL/uL (ref 4.22–5.81)
RDW: 13.9 % (ref 11.5–15.5)
WBC: 14.4 10*3/uL — ABNORMAL HIGH (ref 4.0–10.5)

## 2015-12-31 LAB — TISSUE HYBRIDIZATION TO NCBH

## 2015-12-31 LAB — BONE MARROW EXAM

## 2015-12-31 MED ORDER — SODIUM CHLORIDE 0.9 % IV SOLN
INTRAVENOUS | Status: DC
  Administered 2015-12-31: 07:00:00 via INTRAVENOUS

## 2015-12-31 MED ORDER — FENTANYL CITRATE (PF) 100 MCG/2ML IJ SOLN
INTRAMUSCULAR | Status: AC | PRN
  Administered 2015-12-31: 50 ug via INTRAVENOUS

## 2015-12-31 MED ORDER — MIDAZOLAM HCL 2 MG/2ML IJ SOLN
INTRAMUSCULAR | Status: AC | PRN
  Administered 2015-12-31: 1 mg via INTRAVENOUS

## 2015-12-31 MED ORDER — FENTANYL CITRATE (PF) 100 MCG/2ML IJ SOLN
INTRAMUSCULAR | Status: AC
  Filled 2015-12-31: qty 4

## 2015-12-31 MED ORDER — MIDAZOLAM HCL 2 MG/2ML IJ SOLN
INTRAMUSCULAR | Status: AC
  Filled 2015-12-31: qty 6

## 2015-12-31 MED ORDER — CEFAZOLIN SODIUM-DEXTROSE 2-4 GM/100ML-% IV SOLN
2.0000 g | INTRAVENOUS | Status: AC
  Administered 2015-12-31: 2 g via INTRAVENOUS
  Filled 2015-12-31: qty 100

## 2015-12-31 MED ORDER — HEPARIN SOD (PORK) LOCK FLUSH 100 UNIT/ML IV SOLN
INTRAVENOUS | Status: AC
  Filled 2015-12-31: qty 5

## 2015-12-31 MED ORDER — HEPARIN SOD (PORK) LOCK FLUSH 100 UNIT/ML IV SOLN
INTRAVENOUS | Status: AC | PRN
  Administered 2015-12-31: 500 [IU] via INTRAVENOUS

## 2015-12-31 MED ORDER — MIDAZOLAM HCL 2 MG/2ML IJ SOLN
INTRAMUSCULAR | Status: AC | PRN
  Administered 2015-12-31 (×2): 1 mg via INTRAVENOUS

## 2015-12-31 MED ORDER — LIDOCAINE-EPINEPHRINE (PF) 2 %-1:200000 IJ SOLN
INTRAMUSCULAR | Status: AC | PRN
  Administered 2015-12-31: 20 mL

## 2015-12-31 NOTE — Procedures (Signed)
R IJ Port cathter placement with US and fluoroscopy No complication No blood loss. See complete dictation in [facility] PACS.  

## 2015-12-31 NOTE — Procedures (Signed)
CT-guided  R iliac bone marrow aspiration and core biopsy No complication No blood loss. See complete dictation in [facility] PACS  

## 2015-12-31 NOTE — Progress Notes (Signed)
Patient ID: Erik Vaughn, male   DOB: 11-Dec-1950, 65 y.o.   MRN: [id number]    Referring Physician(s): Brunetta Genera  Supervising Physician: Arne Cleveland  Patient Status:  Outpatient  Chief Complaint:  lymphoma  Subjective:  Pt familiar to IR service from chest wall mass biopsy on 12/23/15. Pathology revealed an aggressive large B-cell lymphoma. He is a 65 y.o. male , former long-term smoker, with history of hypertension, dyslipidemia, peripheral arterial disease and persistent/worsening low back pain with radiation to left lower extremity since May 2017. Patient has also had some difficulty controlling his bladder over the last several weeks. He has also noted a prominent left chest wall mass. MRI of the lumbar spine at outside facility on 12/13/15 revealed pathologic fractures of the left sacrum and S1 body. There is a partially visualized soft tissue mass anterior to the left sacral ala within the left L5/S1 and S1/S2 neural foramina. Ultrasound imaging of the left chest wall mass showed an irregular marginated hypoechoic mass measuring 7.3 cm in the left pectoral region. Recent chest x-ray revealed destructive lytic lesion of the ninth rib on the left posteriorly, abnormal chest wall density posteriorly with a mass measuring at least 5.7 cm and the anterior chest wall mass as mentioned before. He presents again today for CT-guided bone marrow biopsy as well as Port-A-Cath placement for chemotherapy. His primary complaint at this time is left hip pain. He currently denies fever, chills, headache, chest pain, dyspnea, cough, abdominal pain, nausea, vomiting or abnormal bleeding. Past Medical History:  Diagnosis Date  . Hypertension   . PAD (peripheral artery disease) (HCC) left leg   Past Surgical History:  Procedure Laterality Date  . BRAIN SURGERY    . left salivary Left    removed     Allergies: Review of patient's allergies indicates no known  allergies.  Medications: Prior to Admission medications   Medication Sig Start Date End Date Taking? Authorizing Provider  allopurinol (ZYLOPRIM) 300 MG tablet Take 1 tablet (300 mg total) by mouth daily. 12/25/15  Yes Brunetta Genera, MD  amLODipine (NORVASC) 10 MG tablet Take 10 mg by mouth daily.  11/19/15  Yes Historical Provider, MD  aspirin 81 MG tablet Take 81 mg by mouth daily.   Yes Historical Provider, MD  Cholecalciferol (VITAMIN D3) 2000 units TABS Take 2,000 Units by mouth daily.   Yes Historical Provider, MD  clopidogrel (PLAVIX) 75 MG tablet Take 75 mg by mouth daily.   Yes Historical Provider, MD  dexamethasone (DECADRON) 4 MG tablet Take 1 tablet (4 mg total) by mouth 2 (two) times daily with breakfast and lunch. 12/25/15  Yes Brunetta Genera, MD  Docusate Sodium (COLACE PO) Take by mouth.   Yes Historical Provider, MD  lisinopril (PRINIVIL,ZESTRIL) 20 MG tablet Take 20 mg by mouth daily.  11/19/15  Yes Historical Provider, MD  LORazepam (ATIVAN) 0.5 MG tablet Take 1 tablet (0.5 mg total) by mouth every 8 (eight) hours as needed for anxiety. Could take 2 tabs (75m) 30 mins prior to PET/CT scan 12/18/15  Yes Gautam KJuleen China MD  Multiple Vitamins-Minerals (CENTRUM SILVER PO) Take 1 tablet by mouth daily.    Yes Historical Provider, MD  Omega-3 Fatty Acids (FISH OIL) 1200 MG CAPS Take 3,600 mg by mouth daily.   Yes Historical Provider, MD  oxyCODONE (OXYCONTIN) 10 mg 12 hr tablet Take 2 tablets (20 mg total) by mouth every 12 (twelve) hours. 12/30/15  Yes Gautam KJuleen China MD  oxyCODONE-acetaminophen (PERCOCET/ROXICET)  5-325 MG tablet Take 1-2 tablets by mouth every 4 (four) hours as needed for severe pain. 12/18/15  Yes Brunetta Genera, MD  polyethylene glycol [facility]) packet Take 17 g by mouth daily. Patient taking differently: Take 17 g by mouth 2 (two) times daily.  12/18/15  Yes Brunetta Genera, MD  rosuvastatin (CRESTOR) 5 MG tablet Take 5 mg by mouth at  bedtime.  11/19/15  Yes Historical Provider, MD  senna-docusate (SENNA S) 8.6-50 MG tablet Take 2 tablets by mouth at bedtime. 12/18/15  Yes Brunetta Genera, MD  vitamin C (ASCORBIC ACID) 500 MG tablet Take 500 mg by mouth 2 (two) times daily.   Yes Historical Provider, MD     Vital Signs: Blood pressure 141/72, temperature 97.8, heart rate 72, respirations 18, O2 sat 99% room air     Physical Exam patient awake, alert. Slightly hard of hearing. Chest clear to auscultation bilaterally. Noted left anterior and posterior chest wall masses, nontender. Heart with regular rate and rhythm. Abdomen soft, positive bowel sounds, nontender. Lower extremities with no significant edema.  Imaging: No results found.  Labs:  CBC:  Recent Labs  12/18/15 1330 12/23/15 1320 12/25/15 1028 12/31/15 0710  WBC 8.8 6.6 6.4 14.4*  HGB 12.6* 11.6* 11.9* 11.8*  HCT 38.3* 35.5* 36.2* 35.5*  PLT 324 357 381 440*    COAGS:  Recent Labs  12/23/15 1320  INR 1.17  APTT 31    BMP:  Recent Labs  12/15/15 1645 12/18/15 1331 12/25/15 1029  NA 136 140 137  K 3.8 4.3 4.1  CL 100*  --   --   CO2 _0 GLUCOSE 103* 116 135  BUN 15 20.8 15.9  CALCIUM 9.9 10.5* 10.3  CREATININE 0.83 1.0 0.8  GFRNONAA >60  --   --   GFRAA >60  --   --     LIVER FUNCTION TESTS:  Recent Labs  12/15/15 1645 12/18/15 1330 12/18/15 1331 12/25/15 1029  BILITOT 0.9  --  0.66 0.39  AST 29  --  27 20  ALT 25  --  25 19  ALKPHOS 123  --  138 110  PROT 7.5 7.5 8.2 7.5  ALBUMIN 4.2  --  4.2 3.6    Assessment and Plan: Pt with with history of recently diagnosed aggressive large B-cell lymphoma. He presents today for CT-guided bone marrow biopsy as well as Port-A-Cath placement for chemotherapy. Details/risks of procedures, including but not limited to, internal bleeding, infection, injury to adjacent structures, discussed with patient and daughter with their understanding and consent.   Electronically  Signed: D. Rowe Robert 12/31/2015, 8:18 AM   I spent a total of 20 minutes at the the patient's bedside AND on the patient's hospital floor or unit, greater than 50% of which was counseling/coordinating care for CT guided bone marrow biopsy and port a cath placement

## 2015-12-31 NOTE — Discharge Instructions (Signed)
Implanted Port Home Guide    An implanted port is a type of central line that is placed under the skin. Central lines are used to provide IV access when treatment or nutrition needs to be given through a person's veins. Implanted ports are used for long-term IV access. An implanted port may be placed because:    You need IV medicine that would be irritating to the small veins in your hands or arms.     You need long-term IV medicines, such as antibiotics.     You need IV nutrition for a long period.     You need frequent blood draws for lab tests.     You need dialysis.    Implanted ports are usually placed in the chest area, but they can also be placed in the upper arm, the abdomen, or the leg. An implanted port has two main parts:    Reservoir. The reservoir is round and will appear as a small, raised area under your skin. The reservoir is the part where a needle is inserted to give medicines or draw blood.     Catheter. The catheter is a thin, flexible tube that extends from the reservoir. The catheter is placed into a large vein. Medicine that is inserted into the reservoir goes into the catheter and then into the vein.    HOW WILL I CARE FOR MY INCISION SITE?  Do not get the incision site wet. Bathe or shower as directed by your health care provider.   HOW IS MY PORT ACCESSED?  Special steps must be taken to access the port:    Before the port is accessed, a numbing cream can be placed on the skin. This helps numb the skin over the port site.     Your health care provider uses a sterile technique to access the port.   Your health care provider must put on a mask and sterile gloves.   The skin over your port is cleaned carefully with an antiseptic and allowed to dry.   The port is gently pinched between sterile gloves, and a needle is inserted into the port.   Only "non-coring" port needles should be used to access the port. Once the port is accessed, a blood return should be checked. This helps  ensure that the port is in the vein and is not clogged.     If your port needs to remain accessed for a constant infusion, a clear (transparent) bandage will be placed over the needle site. The bandage and needle will need to be changed every week, or as directed by your health care provider.     Keep the bandage covering the needle clean and dry. Do not get it wet. Follow your health care provider's instructions on how to take a shower or bath while the port is accessed.     If your port does not need to stay accessed, no bandage is needed over the port.    WHAT IS FLUSHING?  Flushing helps keep the port from getting clogged. Follow your health care provider's instructions on how and when to flush the port. Ports are usually flushed with saline solution or a medicine called heparin. The need for flushing will depend on how the port is used.    If the port is used for intermittent medicines or blood draws, the port will need to be flushed:     After medicines have been given.     After blood has been drawn.       provider thinks it is needed. When it is time for the port to come out, surgery will be done to remove it. The procedure is similar to the one performed when the port was put in.  WHEN SHOULD I SEEK IMMEDIATE MEDICAL CARE? When you have an implanted port, you should seek immediate medical care if:   You notice a bad smell coming from the incision site.   You have swelling, redness, or drainage at the incision site.   You have more swelling or pain at the port site or the surrounding area.   You have a fever that is not controlled with medicine.   This information is not intended to replace advice given to you by your health care provider. Make sure you discuss any questions you have with  your health care provider.   Document Released: 05/17/2005 Document Revised: 03/07/2013 Document Reviewed: 01/22/2013 Elsevier Interactive Patient Education 2016 [employer]. Implanted Port Insertion, Care After Refer to this sheet in the next few weeks. These instructions provide you with information on caring for yourself after your procedure. Your health care provider may also give you more specific instructions. Your treatment has been planned according to current medical practices, but problems sometimes occur. Call your health care provider if you have any problems or questions after your procedure. WHAT TO EXPECT AFTER THE PROCEDURE After your procedure, it is typical to have the following:   Discomfort at the port insertion site. Ice packs to the area will help.  Bruising on the skin over the port. This will subside in 3-4 days. HOME CARE INSTRUCTIONS  After your port is placed, you will get a manufacturer's information card. The card has information about your port. Keep this card with you at all times.   Know what kind of port you have. There are many types of ports available.   Wear a medical alert bracelet in case of an emergency. This can help alert health care workers that you have a port.   The port can stay in for as long as your health care provider believes it is necessary.   A home health care nurse may give medicines and take care of the port.   You or a family member can get special training and directions for giving medicine and taking care of the port at home.  SEEK MEDICAL CARE IF:   Your port does not flush or you are unable to get a blood return.   You have a fever or chills. SEEK IMMEDIATE MEDICAL CARE IF:  You have new fluid or pus coming from your incision.   You notice a bad smell coming from your incision site.   You have swelling, pain, or more redness at the incision or port site.   You have chest pain or shortness of breath.   This  information is not intended to replace advice given to you by your health care provider. Make sure you discuss any questions you have with your health care provider.   Document Released: 03/07/2013 Document Revised: 05/22/2013 Document Reviewed: 03/07/2013 Elsevier Interactive Patient Education 2016 [employer] Bone Marrow Aspiration and Bone Marrow Biopsy Bone marrow aspiration and bone marrow biopsy are procedures that are done to diagnose blood disorders. You may also have one of these procedures to help diagnose infections or some types of cancer. Bone marrow is the soft tissue that is inside your bones. Blood cells are produced in bone marrow. For bone marrow aspiration, a sample of tissue in  liquid form is removed from inside your bone. For a bone marrow biopsy, a small core of bone marrow tissue is removed. Then these samples are examined under a microscope or tested in a lab. You may need these procedures if you have an abnormal complete blood count (CBC). The aspiration or biopsy sample is usually taken from the top of your hip bone. Sometimes, an aspiration sample is taken from your chest bone (sternum). LET [facility] CARE PROVIDER KNOW ABOUT:  Any allergies you have.  All medicines you are taking, including vitamins, herbs, eye drops, creams, and over-the-counter medicines.  Previous problems you or members of your family have had with the use of anesthetics.  Any blood disorders you have.  Previous surgeries you have had.  Any medical conditions you may have.  Whether you are pregnant or you think that you may be pregnant. RISKS AND COMPLICATIONS Generally, this is a safe procedure. However, problems may occur, including:  Infection.  Bleeding. BEFORE THE PROCEDURE  Ask your health care provider about:  Changing or stopping your regular medicines. This is especially important if you are taking diabetes medicines or blood thinners.  Taking medicines such as aspirin  and ibuprofen. These medicines can thin your blood. Do not take these medicines before your procedure if your health care provider instructs you not to.  Plan to have someone take you home after the procedure.  If you go home right after the procedure, plan to have someone with you for 24 hours. PROCEDURE   An IV tube may be inserted into one of your veins.  The injection site will be cleaned with a germ-killing solution (antiseptic).  You will be given one or more of the following:  A medicine that helps you relax (sedative).  A medicine that numbs the area (local anesthetic).  The bone marrow sample will be removed as follows:  For an aspiration, a hollow needle will be inserted through your skin and into your bone. Bone marrow fluid will be drawn up into a syringe.  For a biopsy, your health care provider will use a hollow needle to remove a core of tissue from your bone marrow.  The needle will be removed.  A bandage (dressing) will be placed over the insertion site and taped in place. The procedure may vary among health care providers and hospitals. AFTER THE PROCEDURE  Your blood pressure, heart rate, breathing rate, and blood oxygen level will be monitored often until the medicines you were given have worn off.  Return to your normal activities as directed by your health care provider.   This information is not intended to replace advice given to you by your health care provider. Make sure you discuss any questions you have with your health care provider.   Document Released: 05/20/2004 Document Revised: 10/01/2014 Document Reviewed: 05/08/2014 Elsevier Interactive Patient Education 2016 [employer]. Bone Marrow Aspiration and Bone Marrow Biopsy, Care After Refer to this sheet in the next few weeks. These instructions provide you with information about caring for yourself after your procedure. Your health care provider may also give you more specific instructions. Your  treatment has been planned according to current medical practices, but problems sometimes occur. Call your health care provider if you have any problems or questions after your procedure. WHAT TO EXPECT AFTER THE PROCEDURE After your procedure, it is common to have:  Soreness or tenderness around the puncture site.  Bruising. HOME CARE INSTRUCTIONS  Take medicines only as directed by  your health care provider.  Follow your health care provider's instructions about:  Puncture site care.  Bandage (dressing) changes and removal.  Bathe and shower as directed by your health care provider.  Check your puncture site every day for signs of infection. Watch for:  Redness, swelling, or pain.  Fluid, blood, or pus.  Return to your normal activities as directed by your health care provider.  Keep all follow-up visits as directed by your health care provider. This is important. SEEK MEDICAL CARE IF:  You have a fever.  You have uncontrollable bleeding.  You have redness, swelling, or pain at the site of your puncture.  You have fluid, blood, or pus coming from your puncture site.   This information is not intended to replace advice given to you by your health care provider. Make sure you discuss any questions you have with your health care provider.   Document Released: 12/04/2004 Document Revised: 10/01/2014 Document Reviewed: 05/08/2014 Elsevier Interactive Patient Education 2016 Elsevier Inc. Moderate Conscious Sedation, Adult Sedation is the use of medicines to promote relaxation and relieve discomfort and anxiety. Moderate conscious sedation is a type of sedation. Under moderate conscious sedation you are less alert than normal but are still able to respond to instructions or stimulation. Moderate conscious sedation is used during short medical and dental procedures. It is milder than deep sedation or general anesthesia and allows you to return to your regular activities  sooner. LET [facility] CARE PROVIDER KNOW ABOUT:   Any allergies you have.  All medicines you are taking, including vitamins, herbs, eye drops, creams, and over-the-counter medicines.  Use of steroids (by mouth or creams).  Previous problems you or members of your family have had with the use of anesthetics.  Any blood disorders you have.  Previous surgeries you have had.  Medical conditions you have.  Possibility of pregnancy, if this applies.  Use of cigarettes, alcohol, or illegal drugs. RISKS AND COMPLICATIONS Generally, this is a safe procedure. However, as with any procedure, problems can occur. Possible problems include:  Oversedation.  Trouble breathing on your own. You may need to have a breathing tube until you are awake and breathing on your own.  Allergic reaction to any of the medicines used for the procedure. BEFORE THE PROCEDURE  You may have blood tests done. These tests can help show how well your kidneys and liver are working. They can also show how well your blood clots.  A physical exam will be done.  Only take medicines as directed by your health care provider. You may need to stop taking medicines (such as blood thinners, aspirin, or nonsteroidal anti-inflammatory drugs) before the procedure.   Do not eat or drink at least 6 hours before the procedure or as directed by your health care provider.  Arrange for a responsible adult, family member, or friend to take you home after the procedure. He or she should stay with you for at least 24 hours after the procedure, until the medicine has worn off. PROCEDURE   An intravenous (IV) catheter will be inserted into one of your veins. Medicine will be able to flow directly into your body through this catheter. You may be given medicine through this tube to help prevent pain and help you relax.  The medical or dental procedure will be done. AFTER THE PROCEDURE  You will stay in a recovery area until the  medicine has worn off. Your blood pressure and pulse will be checked.  Depending on the procedure you had, you may be allowed to go home when you can tolerate liquids and your pain is under control.   This information is not intended to replace advice given to you by your health care provider. Make sure you discuss any questions you have with your health care provider.   Document Released: 02/09/2001 Document Revised: 06/07/2014 Document Reviewed: 01/22/2013 Elsevier Interactive Patient Education [employer].

## 2016-01-01 ENCOUNTER — Encounter (HOSPITAL_COMMUNITY): Payer: Self-pay

## 2016-01-01 ENCOUNTER — Inpatient Hospital Stay (HOSPITAL_COMMUNITY): Payer: PRIVATE HEALTH INSURANCE

## 2016-01-01 ENCOUNTER — Inpatient Hospital Stay (HOSPITAL_COMMUNITY)
Admission: AD | Admit: 2016-01-01 | Discharge: 2016-01-05 | DRG: 847 | Disposition: A | Discharge status: Home / Self Care | Payer: PRIVATE HEALTH INSURANCE | Source: Ambulatory Visit | Attending: Hematology | Admitting: Hematology

## 2016-01-01 ENCOUNTER — Ambulatory Visit: Payer: PRIVATE HEALTH INSURANCE | Admitting: Hematology

## 2016-01-01 DIAGNOSIS — C8378 Burkitt lymphoma, lymph nodes of multiple sites: Secondary | ICD-10-CM | POA: Diagnosis present

## 2016-01-01 DIAGNOSIS — E785 Hyperlipidemia, unspecified: Secondary | ICD-10-CM | POA: Diagnosis present

## 2016-01-01 DIAGNOSIS — Z87891 Personal history of nicotine dependence: Secondary | ICD-10-CM

## 2016-01-01 DIAGNOSIS — I739 Peripheral vascular disease, unspecified: Secondary | ICD-10-CM | POA: Diagnosis present

## 2016-01-01 DIAGNOSIS — Z5111 Encounter for antineoplastic chemotherapy: Secondary | ICD-10-CM | POA: Diagnosis present

## 2016-01-01 DIAGNOSIS — I10 Essential (primary) hypertension: Secondary | ICD-10-CM | POA: Diagnosis present

## 2016-01-01 DIAGNOSIS — C8370 Burkitt lymphoma, unspecified site: Secondary | ICD-10-CM

## 2016-01-01 DIAGNOSIS — C8338 Diffuse large B-cell lymphoma, lymph nodes of multiple sites: Secondary | ICD-10-CM

## 2016-01-01 DIAGNOSIS — G893 Neoplasm related pain (acute) (chronic): Secondary | ICD-10-CM | POA: Diagnosis present

## 2016-01-01 DIAGNOSIS — K219 Gastro-esophageal reflux disease without esophagitis: Secondary | ICD-10-CM

## 2016-01-01 LAB — COMPREHENSIVE METABOLIC PANEL
ALT: 48 U/L (ref 17–63)
ANION GAP: 9 (ref 5–15)
AST: 28 U/L (ref 15–41)
Albumin: 3.4 g/dL — ABNORMAL LOW (ref 3.5–5.0)
Alkaline Phosphatase: 84 U/L (ref 38–126)
BUN: 20 mg/dL (ref 6–20)
CHLORIDE: 96 mmol/L — AB (ref 101–111)
CO2: 29 mmol/L (ref 22–32)
CREATININE: 0.52 mg/dL — AB (ref 0.61–1.24)
Calcium: 9 mg/dL (ref 8.9–10.3)
Glucose, Bld: 96 mg/dL (ref 65–99)
POTASSIUM: 4.4 mmol/L (ref 3.5–5.1)
SODIUM: 134 mmol/L — AB (ref 135–145)
Total Bilirubin: 0.5 mg/dL (ref 0.3–1.2)
Total Protein: 6.5 g/dL (ref 6.5–8.1)

## 2016-01-01 LAB — CBC WITH DIFFERENTIAL/PLATELET
BASOS PCT: 0 %
Basophils Absolute: 0 10*3/uL (ref 0.0–0.1)
Eosinophils Absolute: 0 10*3/uL (ref 0.0–0.7)
Eosinophils Relative: 0 %
HEMATOCRIT: 33.7 % — AB (ref 39.0–52.0)
Hemoglobin: 11.2 g/dL — ABNORMAL LOW (ref 13.0–17.0)
Lymphocytes Relative: 9 %
Lymphs Abs: 1.3 10*3/uL (ref 0.7–4.0)
MCH: 27.5 pg (ref 26.0–34.0)
MCHC: 33.2 g/dL (ref 30.0–36.0)
MCV: 82.8 fL (ref 78.0–100.0)
MONO ABS: 0.8 10*3/uL (ref 0.1–1.0)
MONOS PCT: 6 %
NEUTROS ABS: 11.6 10*3/uL — AB (ref 1.7–7.7)
Neutrophils Relative %: 85 %
Platelets: 424 10*3/uL — ABNORMAL HIGH (ref 150–400)
RBC: 4.07 MIL/uL — ABNORMAL LOW (ref 4.22–5.81)
RDW: 14 % (ref 11.5–15.5)
WBC: 13.7 10*3/uL — AB (ref 4.0–10.5)

## 2016-01-01 LAB — CSF CELL COUNT WITH DIFFERENTIAL
RBC Count, CSF: 2880 /mm3 — ABNORMAL HIGH
RBC Count, CSF: 930 /mm3 — ABNORMAL HIGH
Tube #: 1
Tube #: 4
WBC CSF: 2 /mm3 (ref 0–5)
WBC, CSF: 2 /mm3 (ref 0–5)

## 2016-01-01 LAB — MAGNESIUM: MAGNESIUM: 2 mg/dL (ref 1.7–2.4)

## 2016-01-01 LAB — PROTEIN AND GLUCOSE, CSF
Glucose, CSF: 93 mg/dL — ABNORMAL HIGH (ref 40–70)
Total  Protein, CSF: 45 mg/dL (ref 15–45)

## 2016-01-01 LAB — PHOSPHORUS: PHOSPHORUS: 4.1 mg/dL (ref 2.5–4.6)

## 2016-01-01 MED ORDER — ACETAMINOPHEN 325 MG PO TABS: 650.0000 mg | ORAL_TABLET | ORAL | Status: DC | PRN

## 2016-01-01 MED ORDER — POLYETHYLENE GLYCOL 3350 17 G PO PACK
17.0000 g | PACK | Freq: Two times a day (BID) | ORAL | Status: DC
  Administered 2016-01-03 – 2016-01-04 (×4): 17 g via ORAL

## 2016-01-01 MED ORDER — LISINOPRIL 20 MG PO TABS
20.0000 mg | ORAL_TABLET | Freq: Every evening | ORAL | Status: DC
  Administered 2016-01-03 – 2016-01-04 (×2): 20 mg via ORAL
  Filled 2016-01-01: qty 1

## 2016-01-01 MED ORDER — PREDNISONE 50 MG PO TABS
80.0000 mg | ORAL_TABLET | Freq: Every day | ORAL | Status: AC
  Administered 2016-01-01 – 2016-01-05 (×5): 80 mg via ORAL
  Filled 2016-01-01 (×10): qty 1

## 2016-01-01 MED ORDER — PANTOPRAZOLE SODIUM 40 MG PO TBEC
40.0000 mg | DELAYED_RELEASE_TABLET | Freq: Every day | ORAL | Status: DC
  Administered 2016-01-01 – 2016-01-05 (×5): 40 mg via ORAL
  Filled 2016-01-01 (×5): qty 1

## 2016-01-01 MED ORDER — HEPARIN SOD (PORK) LOCK FLUSH 100 UNIT/ML IV SOLN
250.0000 [IU] | Freq: Once | INTRAVENOUS | Status: DC | PRN
Start: 2016-01-01 — End: 2016-01-05

## 2016-01-01 MED ORDER — OXYCODONE-ACETAMINOPHEN 5-325 MG PO TABS: 1.0000 | ORAL_TABLET | ORAL | Status: DC | PRN

## 2016-01-01 MED ORDER — ASPIRIN EC 81 MG PO TBEC
81.0000 mg | DELAYED_RELEASE_TABLET | Freq: Every day | ORAL | Status: DC
  Administered 2016-01-03 – 2016-01-04 (×2): 81 mg via ORAL

## 2016-01-01 MED ORDER — CLOPIDOGREL BISULFATE 75 MG PO TABS: 75.0000 mg | ORAL_TABLET | Freq: Every day | ORAL | Status: DC

## 2016-01-01 MED ORDER — SENNOSIDES-DOCUSATE SODIUM 8.6-50 MG PO TABS
2.0000 | ORAL_TABLET | Freq: Every day | ORAL | Status: DC
  Administered 2016-01-03 – 2016-01-04 (×2): 2 via ORAL

## 2016-01-01 MED ORDER — COLD PACK MISC ONCOLOGY
1.0000 | Freq: Once | Status: DC | PRN
  Filled 2016-01-01: qty 1

## 2016-01-01 MED ORDER — AMLODIPINE BESYLATE 10 MG PO TABS
10.0000 mg | ORAL_TABLET | Freq: Every evening | ORAL | Status: DC
  Administered 2016-01-03 – 2016-01-04 (×2): 10 mg via ORAL
  Filled 2016-01-01: qty 1

## 2016-01-01 MED ORDER — ALTEPLASE 2 MG IJ SOLR
2.0000 mg | Freq: Once | INTRAMUSCULAR | Status: DC | PRN
  Filled 2016-01-01: qty 2

## 2016-01-01 MED ORDER — SODIUM CHLORIDE 0.9 % IV SOLN
INTRAVENOUS | Status: DC
  Administered 2016-01-01: 11:00:00 via INTRAVENOUS

## 2016-01-01 MED ORDER — LORAZEPAM 0.5 MG PO TABS: 0.5000 mg | ORAL_TABLET | Freq: Three times a day (TID) | ORAL | Status: DC | PRN

## 2016-01-01 MED ORDER — OXYCODONE HCL ER 20 MG PO T12A
20.0000 mg | EXTENDED_RELEASE_TABLET | Freq: Two times a day (BID) | ORAL | Status: DC
  Administered 2016-01-03: 20 mg via ORAL

## 2016-01-01 MED ORDER — VINCRISTINE SULFATE CHEMO INJECTION 1 MG/ML
Freq: Once | INTRAVENOUS | Status: AC
  Administered 2016-01-01: 15:00:00 via INTRAVENOUS
  Filled 2016-01-01: qty 10

## 2016-01-01 MED ORDER — HEPARIN SOD (PORK) LOCK FLUSH 100 UNIT/ML IV SOLN: 500.0000 [IU] | Freq: Once | INTRAVENOUS | Status: DC | PRN

## 2016-01-01 MED ORDER — SODIUM CHLORIDE 0.9 % IV SOLN
Freq: Once | INTRAVENOUS | Status: AC
  Administered 2016-01-01: 8 mg via INTRAVENOUS
  Filled 2016-01-01: qty 4

## 2016-01-01 MED ORDER — SODIUM CHLORIDE 0.9% FLUSH: 10.0000 mL | INTRAVENOUS | Status: DC | PRN

## 2016-01-01 MED ORDER — HOT PACK MISC ONCOLOGY
1.0000 | Freq: Once | Status: DC | PRN
  Filled 2016-01-01: qty 1

## 2016-01-01 MED ORDER — SODIUM CHLORIDE 0.9% FLUSH: 3.0000 mL | INTRAVENOUS | Status: DC | PRN

## 2016-01-01 MED ORDER — ENOXAPARIN SODIUM 40 MG/0.4ML ~~LOC~~ SOLN
40.0000 mg | SUBCUTANEOUS | Status: DC
  Administered 2016-01-01 – 2016-01-04 (×4): 40 mg via SUBCUTANEOUS
  Filled 2016-01-01 (×4): qty 0.4

## 2016-01-01 MED ORDER — ALLOPURINOL 300 MG PO TABS
300.0000 mg | ORAL_TABLET | Freq: Every day | ORAL | Status: DC
  Administered 2016-01-03 – 2016-01-04 (×2): 300 mg via ORAL
  Filled 2016-01-01: qty 1

## 2016-01-01 NOTE — Progress Notes (Signed)
Manual calculation of BSA and dosing for Doxorubicin, Etoposide, and Vincristine completed. Verification by Reyne Dumas RN and Jena Gauss, RN

## 2016-01-01 NOTE — H&P (Signed)
[name]    HEMATOLOGY/ONCOLOGY CONSULTATION NOTE  Date of Service: 01/01/2016  Patient Care Team: Ernestene Kiel, MD as PCP - General (Internal Medicine) [name], MD as Consulting Physician (Orthopedic Surgery)  CHIEF COMPLAINTS/PURPOSE OF CONSULTATION:  Newly diagnosed aggressive B-cell lymphoma. Admitted for first cycle of EPOCH-R  HISTORY OF PRESENTING ILLNESS:   Erik Vaughn is a wonderful 65 y.o. male who has been referred to [place] by Dr .Ernestene Kiel, MD for evaluation and management of aggressive large B-cell lymphoma.  Patient was recently evaluated in the clinic and was diagnosed with an aggressive large B-cell lymphoma with a Ki-67 of close to 100. I discussed his case with Dr. Tresa Moore today and he appears to have [place] break-apart event without BCL-2 or BCL-6 mutation suggestive more of Burkitt's lymphoma/Burkitt's variant lymphoma.   Patient is admitted for his first cycle of EPOCH-R . He notes that he is feeling much better after being started on steroids and his pain is much better. No issues with taking the allopurinol. Had uneventful port placement yesterday. He had a bone marrow biopsy yesterday of the results of which are pending. Echocardiogram showed normal ejection fraction.  Had a IR fluoroscopically guided lumbar puncture today with the preliminary results showing only 2 WBC and not consistent with Burkitt's involvement of the CNS but the cytology is pending.  Patient is accompanied by his wife and daughter. He is in good spirits. He was seen in the morning and again in the afternoon and appears to be tolerating his chemotherapy well with no nausea. Expresses significant gratitude for his cares.     MEDICAL HISTORY:  Past Medical History:  Diagnosis Date  . Hypertension   . 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   He smokes about 2 packs a day for 40 years.  SURGICAL HISTORY: Past Surgical History:  Procedure Laterality Date  . BRAIN SURGERY    . IR GENERIC HISTORICAL  12/31/2015   IR FLUORO GUIDE CV LINE RIGHT 12/31/2015 Arne Cleveland, MD [facility]  . IR GENERIC HISTORICAL  12/31/2015   IR US GUIDE VASC ACCESS RIGHT 12/31/2015 Arne Cleveland, MD [facility]  . left salivary Left    removed    SOCIAL HISTORY: Social History   Social History  . Marital status: Married    Spouse name: N/A  . Number of children: N/A  . Years of education: N/A   Occupational History  . Not on file.   Social History Main Topics  . Smoking status: Former Smoker    Packs/day: 2.00    Quit date: 12/22/2005  . Smokeless tobacco: Never Used  . Alcohol use Yes     Comment: occasionally  . Drug use: Unknown  . Sexual activity: Not on file   Other Topics Concern  . Not on file   Social History Narrative  . No narrative on file    FAMILY HISTORY: History reviewed. No pertinent family history.  ALLERGIES:  has No Known Allergies.  MEDICATIONS:  Current Facility-Administered Medications  Medication Dose Route Frequency Provider Last Rate Last Dose  . 0.9 %  sodium chloride infusion   Intravenous Continuous Brunetta Genera, MD 20 mL/hr at 01/01/16 1100    . acetaminophen (TYLENOL) tablet 650 mg  650 mg Oral Q4H PRN Brunetta Genera, MD      .  allopurinol (ZYLOPRIM) tablet 300 mg  300 mg Oral Daily Gautam Juleen China, MD      . alteplase (CATHFLO ACTIVASE) injection 2 mg  2 mg Intracatheter Once PRN Brunetta Genera, MD      . amLODipine (NORVASC) tablet 10 mg  10 mg Oral QPM Brunetta Genera, MD      . aspirin EC tablet 81 mg  81 mg Oral QHS Brunetta Genera, MD      . Cold Pack 1 packet  1 packet Topical Once PRN Brunetta Genera, MD      . DOXOrubicin (ADRIAMYCIN) 20 mg, etoposide (VEPESID) 104 mg,  vinCRIStine (ONCOVIN) 0.8 mg in sodium chloride 0.9 % 600 mL chemo infusion   Intravenous Once Brunetta Genera, MD 28 mL/hr at 01/01/16 1453    . enoxaparin (LOVENOX) injection 40 mg  40 mg Subcutaneous Q24H Brunetta Genera, MD      . heparin lock flush 100 unit/mL  500 Units Intracatheter Once PRN Brunetta Genera, MD      . heparin lock flush 100 unit/mL  250 Units Intracatheter Once PRN Brunetta Genera, MD      . Hot Pack 1 packet  1 packet Topical Once PRN Brunetta Genera, MD      . lisinopril (PRINIVIL,ZESTRIL) tablet 20 mg  20 mg Oral QPM Brunetta Genera, MD      . LORazepam (ATIVAN) tablet 0.5 mg  0.5 mg Oral Q8H PRN Brunetta Genera, MD      . oxyCODONE (OXYCONTIN) 12 hr tablet 20 mg  20 mg Oral Q12H Gautam Juleen China, MD      . oxyCODONE-acetaminophen (PERCOCET/ROXICET) 5-325 MG per tablet 1-2 tablet  1-2 tablet Oral Q4H PRN Brunetta Genera, MD      . pantoprazole (PROTONIX) EC tablet 40 mg  40 mg Oral Daily Brunetta Genera, MD   40 mg at 01/01/16 1346  . polyethylene glycol (MIRALAX / GLYCOLAX) packet 17 g  17 g Oral BID Brunetta Genera, MD      . predniSONE (DELTASONE) tablet 80 mg  80 mg Oral QAC breakfast Brunetta Genera, MD   80 mg at 01/01/16 1342  . senna-docusate (Senokot-S) tablet 2 tablet  2 tablet Oral QHS Brunetta Genera, MD      . sodium chloride flush (NS) 0.9 % injection 10 mL  10 mL Intracatheter PRN Brunetta Genera, MD      . sodium chloride flush (NS) 0.9 % injection 3 mL  3 mL Intravenous PRN Gautam Juleen China, MD        REVIEW OF SYSTEMS:    10 Point review of Systems was done is negative except as noted above.  PHYSICAL EXAMINATION: ECOG PERFORMANCE STATUS: 1 - Symptomatic but completely ambulatory  . Vitals:   01/01/16 0855 01/01/16 1426  BP: 130/79 133/66  Pulse: 86 88  Resp: 18 20  Temp: 97.5 F (36.4 C) 98.1 F (36.7 C)   Filed Weights   01/01/16 0855  Weight: 190 lb (86.2 kg)   .Body mass  index is 25.07 kg/m.  GENERAL:alert, in no acute distress and comfortable SKIN: skin color, texture, turgor are normal, no rashes or significant lesions EYES: normal, conjunctiva are pink and non-injected, sclera clear OROPHARYNX:no exudate, no erythema and lips, buccal mucosa, and tongue normal  NECK: supple, no JVD, thyroid normal size, non-tender, without nodularity LYMPH:  no palpable lymphadenopathy in the cervical, axillary or inguinal LUNGS: clear to  auscultation with normal respiratory effort, large left anterior chest wall mass noted appears nontender and fixed. HEART: regular rate & rhythm,  no murmurs and no lower extremity edema ABDOMEN: abdomen soft, non-tender, normoactive bowel sounds  Musculoskeletal: no cyanosis of digits and no clubbing  PSYCH: alert & oriented x 3 with fluent speech NEURO: no focal motor/sensory deficits  LABORATORY DATA:  I have reviewed the data as listed  . CBC Latest Ref Rng & Units 01/01/2016 12/31/2015 12/25/2015  WBC 4.0 - 10.5 K/uL 13.7(H) 14.4(H) 6.4  Hemoglobin 13.0 - 17.0 g/dL 11.2(L) 11.8(L) 11.9(L)  Hematocrit 39.0 - 52.0 % 33.7(L) 35.5(L) 36.2(L)  Platelets 150 - 400 K/uL 424(H) 440(H) 381    . CMP Latest Ref Rng & Units 01/01/2016 12/25/2015 12/18/2015  Glucose 65 - 99 mg/dL 96 135 116  BUN 6 - 20 mg/dL 20 15.9 20.8  Creatinine 0.61 - 1.24 mg/dL 0.52(L) 0.8 1.0  Sodium 135 - 145 mmol/L 134(L) 137 140  Potassium 3.5 - 5.1 mmol/L 4.4 4.1 4.3  Chloride 101 - 111 mmol/L 96(L) - -  CO2 22 - 32 mmol/L 29 29 28   Calcium 8.9 - 10.3 mg/dL 9.0 10.3 10.5(H)  Total Protein 6.5 - 8.1 g/dL 6.5 7.5 8.2  Total Bilirubin 0.3 - 1.2 mg/dL 0.5 0.39 0.66  Alkaline Phos 38 - 126 U/L 84 110 138  AST 15 - 41 U/L 28 20 27   ALT 17 - 63 U/L 48 19 25   Component     Latest Ref Rng & Units 01/01/2016  Phosphorus     2.5 - 4.6 mg/dL 4.1  Magnesium     1.7 - 2.4 mg/dL 2.0    RADIOGRAPHIC STUDIES: I have personally reviewed the radiological images as listed  and agreed with the findings in the report. Dg Chest 2 View  Result Date: 12/18/2015 CLINICAL DATA:  Metastatic cancer.  Left chest wall mass. EXAM: CHEST  2 VIEW COMPARISON:  None. FINDINGS: Heart size is normal. There is aortic atherosclerosis. No abnormality is seen in the right chest. On the frontal view, there is abnormal density projected over the left lower chest. There is lytic destruction of the left ninth rib posteriorly. There is a posterior chest wall mass measuring at least 5.7 cm in diameter seen on the lateral. Additionally, there is in the anterior retrosternal density on the lateral that could also be a chest wall mass anteriorly. No effusions. No spinal fracture seen. IMPRESSION: Lytic destructive lesion of the ninth rib on the left posteriorly. Abnormal chest wall density posteriorly with a mass measuring at least 5.7 cm. Possible anterior chest wall mass as well. Electronically Signed   By: Nelson Chimes M.D.   On: 12/18/2015 13:59   Nm Pet Image Initial (pi) Skull Base To Thigh  Result Date: 12/25/2015 CLINICAL DATA:  Initial treatment strategy for diffuse large B-cell lymphoma with osseous metastases. EXAM: NUCLEAR MEDICINE PET SKULL BASE TO THIGH TECHNIQUE: 9.29 mCi F-18 FDG was injected intravenously. Full-ring PET imaging was performed from the skull base to thigh after the radiotracer. CT data was obtained and used for attenuation correction and anatomic localization. FASTING BLOOD GLUCOSE:  Value: 123 mg/dl COMPARISON:  None. FINDINGS: NECK No hypermetabolic lymph nodes in the neck. CHEST 13 x 11 mm posterior right upper lobe/ right perihilar nodule (series 8/ image 25), max SUV 18.4, suspicious for pulmonary metastasis. Trace left pleural effusion. 2.3 x 1.9 cm left paratracheal node (series 4/ image 57), max SUV 36.8. Additional 8 mm  short axis left infrahilar node (series 4/ image 80), max SUV 13.0. Additional small mediastinal lymph nodes, although without appreciable  hypermetabolism. Three vessel coronary atherosclerosis. Mild atherosclerotic calcifications of the abdominal aorta. ABDOMEN/PELVIS No abnormal hypermetabolic activity within the liver, pancreas, adrenal glands, or spleen. 7.1 x 10.6 cm aggregate nodal mass in the right mid abdomen (series 4/ image 147), max SUV 41.5. 6.4 x 5.3 cm left external iliac nodal mass (series 4/image 137), max SUV 38.7. Additional foci of hypermetabolism along the right anterior abdominal wall and right abdominal mesentery. Prostatomegaly, with enlargement of the central gland which indents the base of the bladder. Mildly thick-walled bladder. Atherosclerotic calcifications the abdominal aorta and branch vessels. SKELETON 4.9 x 6.8 cm left anterior chest wall metastasis involving the right anterior 2nd and 3rd rib (series 4/ image 80), max SUV 37.2. 4.8 x 5.9 cm left posterior chest wall metastasis (series 4/ image 91), with associated pathologic fracture of the left posterior 9th rib, max SUV 48.6. Left sacral metastasis (series 4/ image 163), max SUV 31.1. Additional hypermetabolism involving the left acetabulum and iliac bone. Right proximal humeral metastasis, max SUV 39.9. IMPRESSION: Widespread lymphoma in the chest, abdomen, and pelvis, as above. 13 mm posterior right upper lobe pulmonary nodule, suspicious for pulmonary metastasis. Upper thoracic and left perihilar nodal lymphoma. Large nodal masses in the right mid abdomen and left pelvis. Osseous metastases involving the left anterior and left posterior chest wall, with pathologic fracture of the left posterior 9th rib. Additional osseous metastases involving the left sacral and right proximal humerus. Additional ancillary findings as above. Electronically Signed   By: Julian Hy M.D.   On: 12/25/2015 10:53  US Biopsy  Result Date: 12/23/2015 CLINICAL DATA:  Large mass of the left chest wall as well as destructive lesion of the left ninth rib and sacral mass. The  patient presents for biopsy of the chest wall mass. EXAM: ULTRASOUND GUIDED CORE BIOPSY OF LEFT CHEST WALL MASS MEDICATIONS: 1.0 mg IV Versed; 50 mcg IV Fentanyl Total Moderate Sedation Time: 9 minutes. The patient's level of consciousness and physiologic status were continuously monitored during the procedure by Radiology nursing. PROCEDURE: The procedure, risks, benefits, and alternatives were explained to the patient. Questions regarding the procedure were encouraged and answered. The patient understands and consents to the procedure. A time-out was performed prior to initiating the procedure. The left chest wall was prepped with chlorhexidine in a sterile fashion, and a sterile drape was applied covering the operative field. A sterile gown and sterile gloves were used for the procedure. Local anesthesia was provided with 1% Lidocaine. A left chest wall mass was localized by ultrasound. Seven separate 16 gauge core biopsy samples were obtained and submitted in both saline and formalin. Post biopsy imaging was performed with ultrasound. COMPLICATIONS: None. FINDINGS: Large hypoechoic, solid oval shaped left chest wall mass is identified in the left pectoral region. Solid tissue was obtained. IMPRESSION: Ultrasound-guided core biopsy performed of a large left chest wall mass. Electronically Signed   By: Aletta Edouard M.D.   On: 12/23/2015 16:50  Ir Fluoro Guide Cv Line Right  Result Date: 12/31/2015 CLINICAL DATA:  Diffuse large B-cell lymphoma, needs durable venous access for chemotherapy EXAM: TUNNELED PORT CATHETER PLACEMENT WITH ULTRASOUND AND FLUOROSCOPIC GUIDANCE FLUOROSCOPY TIME:  6 seconds, 2 mGy ANESTHESIA/SEDATION: Intravenous Fentanyl and Versed were administered as conscious sedation during continuous monitoring of the patient's level of consciousness and physiological / cardiorespiratory status by the radiology RN, with a total  moderate sedation time of 15 minutes. TECHNIQUE: The procedure, risks,  benefits, and alternatives were explained to the patient. Questions regarding the procedure were encouraged and answered. The patient understands and consents to the procedure. As antibiotic prophylaxis, cefazolin 2 g was ordered pre-procedure and administered intravenously within one hour of incision. Patency of the right IJ vein was confirmed with ultrasound with image documentation. An appropriate skin site was determined. Skin site was marked. Region was prepped using maximum barrier technique including cap and mask, sterile gown, sterile gloves, large sterile sheet, and Chlorhexidine as cutaneous antisepsis. The region was infiltrated locally with 1% lidocaine. Under real-time ultrasound guidance, the right IJ vein was accessed with a 21 gauge micropuncture needle; the needle tip within the vein was confirmed with ultrasound image documentation. Needle was exchanged over a 018 guidewire for transitional dilator which allowed passage of the [facility] wire into the IVC. Over this, the transitional dilator was exchanged for a 5 [place] MPA catheter. A small incision was made on the right anterior chest wall and a subcutaneous pocket fashioned. The power-injectable port was positioned and its catheter tunneled to the right IJ dermatotomy site. The MPA catheter was exchanged over an Amplatz wire for a peel-away sheath, through which the port catheter, which had been trimmed to the appropriate length, was advanced and positioned under fluoroscopy with its tip at the cavoatrial junction. Spot chest radiograph confirms good catheter position and no pneumothorax. The pocket was closed with deep interrupted and subcuticular continuous 3-0 Monocryl sutures. The port was flushed per protocol. The incisions were covered with Dermabond then covered with a sterile dressing. COMPLICATIONS: COMPLICATIONS None immediate IMPRESSION: Technically successful right IJ power-injectable port catheter placement. Ready for routine use.  Electronically Signed   By: Lucrezia Europe M.D.   On: 12/31/2015 10:20   Ir US Guide Vasc Access Right  Result Date: 12/31/2015 CLINICAL DATA:  Diffuse large B-cell lymphoma, needs durable venous access for chemotherapy EXAM: TUNNELED PORT CATHETER PLACEMENT WITH ULTRASOUND AND FLUOROSCOPIC GUIDANCE FLUOROSCOPY TIME:  6 seconds, 2 mGy ANESTHESIA/SEDATION: Intravenous Fentanyl and Versed were administered as conscious sedation during continuous monitoring of the patient's level of consciousness and physiological / cardiorespiratory status by the radiology RN, with a total moderate sedation time of 15 minutes. TECHNIQUE: The procedure, risks, benefits, and alternatives were explained to the patient. Questions regarding the procedure were encouraged and answered. The patient understands and consents to the procedure. As antibiotic prophylaxis, cefazolin 2 g was ordered pre-procedure and administered intravenously within one hour of incision. Patency of the right IJ vein was confirmed with ultrasound with image documentation. An appropriate skin site was determined. Skin site was marked. Region was prepped using maximum barrier technique including cap and mask, sterile gown, sterile gloves, large sterile sheet, and Chlorhexidine as cutaneous antisepsis. The region was infiltrated locally with 1% lidocaine. Under real-time ultrasound guidance, the right IJ vein was accessed with a 21 gauge micropuncture needle; the needle tip within the vein was confirmed with ultrasound image documentation. Needle was exchanged over a 018 guidewire for transitional dilator which allowed passage of the [facility] wire into the IVC. Over this, the transitional dilator was exchanged for a 5 [place] MPA catheter. A small incision was made on the right anterior chest wall and a subcutaneous pocket fashioned. The power-injectable port was positioned and its catheter tunneled to the right IJ dermatotomy site. The MPA catheter was exchanged over an  Amplatz wire for a peel-away sheath, through which the port catheter, which  had been trimmed to the appropriate length, was advanced and positioned under fluoroscopy with its tip at the cavoatrial junction. Spot chest radiograph confirms good catheter position and no pneumothorax. The pocket was closed with deep interrupted and subcuticular continuous 3-0 Monocryl sutures. The port was flushed per protocol. The incisions were covered with Dermabond then covered with a sterile dressing. COMPLICATIONS: COMPLICATIONS None immediate IMPRESSION: Technically successful right IJ power-injectable port catheter placement. Ready for routine use. Electronically Signed   By: Lucrezia Europe M.D.   On: 12/31/2015 10:20   Ct Biopsy  Result Date: 12/31/2015 CLINICAL DATA:  Diffuse large B-cell lymphoma, osseous metastases EXAM: CT GUIDED DEEP ILIAC BONE ASPIRATION AND CORE BIOPSY TECHNIQUE: The procedure, risks (including but not limited to bleeding, infection, organ damage ), benefits, and alternatives were explained to the patient. Questions regarding the procedure were encouraged and answered. The patient understands and consents to the procedure. Patient was placed supine on the CT gantry and limited axial scans through the pelvis were obtained. Appropriate skin entry site was identified. Skin site was marked, prepped with Betadine, draped in usual sterile fashion, and infiltrated locally with 1% lidocaine. Intravenous Fentanyl and Versed were administered as conscious sedation during continuous monitoring of the patient's level of consciousness and physiological / cardiorespiratory status by the radiology RN, with a total moderate sedation time of 9 minutes. Under CT fluoroscopic guidance a trocar bone needle was advanced into the right iliac bone just lateral to the sacroiliac joint using the OncoTrol device. Once needle tip position was confirmed, coaxial core and aspiration samples were obtained. The final sample was  obtained using the guiding needle itself, which was then removed. Post procedure scans show no hematoma or fracture. Patient tolerated procedure well. COMPLICATIONS: COMPLICATIONS none IMPRESSION: 1. Technically successful CT guided right iliac bone core and aspiration biopsy. Electronically Signed   By: Lucrezia Europe M.D.   On: 12/31/2015 10:20   Mr Outside Films Spine  Result Date: 12/18/2015 CLINICAL DATA:  This exam is stored here for comparison purposes only and was performed at an outside facility.   Please contact the originating institution for any associated interpretation or report.   Dg Fluoro Guide Lumbar Puncture  Result Date: 01/01/2016 CLINICAL DATA:  65 year old male with Burkitt's lymphoma. Subsequent encounter. EXAM: DIAGNOSTIC LUMBAR PUNCTURE UNDER FLUOROSCOPIC GUIDANCE FLUOROSCOPY TIME:  If the device does not provide the exposure index: Fluoroscopy Time (in minutes and seconds):  20 second Number of Acquired Images:  2 PROCEDURE: Case discussed with staff. No clinical suspicion for intracranial abnormality. Informed consent was obtained from the patient prior to the procedure, including potential complications of headache, bleeding allergy, and pain. Time-out performed. With the patient prone, the lower back was prepped with Betadine. 1% Lidocaine was used for local anesthesia. Lumbar puncture was performed at the L2-3 level using a 22 gauge needle with return of initially minimally blood tinged CSF which cleared with an opening pressure of 12 cm water. Ten ml of CSF were obtained for laboratory studies. The patient tolerated the procedure well and there were no apparent complications. IMPRESSION: L2-3 lumbar puncture with collection of 10 cc of cerebral spinal fluid sent for labs as per request. Electronically Signed   By: Genia Del M.D.   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 Tresa Moore. Cytogenetic still pending. This appears to be involving the sacrum causing sacral and root involvement with possible neurogenic bladder. PET CT scan as noted above shows extensive involvement with lymphoma. Patient has no focal neurological symptoms suggestive of CNS involvement however he would be considered high risk given the extent of his lymphoma and involvement of the spine.   Echo showed normal ejection fraction 65-70%.  Plan -Follow-up on cytogenetics for Burkitt's. -Pending bone marrow biopsy results. -Had a lumbar puncture today that on preliminary appears negative for involvement by Burkitt's. -All he will start EPOCH-R today and Rituxan on day 5.  -Will get Neulasta on day 6 as outpatient. -Daily CBC, CMP, uric acid -Will need to discuss plan for CNS prophylaxis/treatment based on lumbar puncture results. - Continue on on allopurinol for tumor lysis syndrome prophylaxis .  #2 left sacral ala and S1 fracture. L5-S1 and S1-S2 left-sided nerve root compression. #3 neoplasm related pain Plan -continue OxyContin 20 mg by mouth twice a day and the Ativan and Percocet for breakthrough pain. -Patient notes his pain is getting better and he is hardly uses when necessary Percocet for breakthrough pain. -As pain is controlled we'll been off his OxyContin. -SI joint brace and help with pain control.  #4 Hypertension, dyslipidemia, peripheral arterial disease -Reconciled appropriate medications. -Plavix on hold for port placement and lumbar puncture. We will restart in 2-3 days if no issues with bleeding. -On aspirin . -Holding cholesterol medicine nonessential medications during chemotherapy .  #5 ex-smoker with 80-pack-year history of smoking.   I spent 60 minutes counseling the patient face to face. The total time spent in the appointment was 60 minutes and more than 50% was on counseling  and direct patient cares.    Sullivan Lone MD [place] AAHIVMS [facility] Hematology/Oncology Physician [facility]  (Office):       [phone] (Work cell):  [phone] (Fax):           [phone]

## 2016-01-01 NOTE — Procedures (Signed)
Case discussed with staff and no suspicion for increase intracranial pressure.  Labs checked and confirmed Plavix held for 5 days.  Informed consent obtained and Time out performed.  L2-3 LP performed with single pass 22g needle.  OP 12cm H2O.  10 cc CSF collected (intially minimally blood tinged and cleared).  Sent for labs as per request.  No immediate complications.  Reviewed post tprocedure instructions in detail.

## 2016-01-02 LAB — COMPREHENSIVE METABOLIC PANEL
ALBUMIN: 3.2 g/dL — AB (ref 3.5–5.0)
ALT: 41 U/L (ref 17–63)
ANION GAP: 6 (ref 5–15)
AST: 23 U/L (ref 15–41)
Alkaline Phosphatase: 72 U/L (ref 38–126)
BUN: 22 mg/dL — ABNORMAL HIGH (ref 6–20)
CHLORIDE: 101 mmol/L (ref 101–111)
CO2: 29 mmol/L (ref 22–32)
Calcium: 9.3 mg/dL (ref 8.9–10.3)
Creatinine, Ser: 0.8 mg/dL (ref 0.61–1.24)
GFR calc non Af Amer: >60 mL/min (ref >60)
Glucose, Bld: 140 mg/dL — ABNORMAL HIGH (ref 65–99)
POTASSIUM: 4.9 mmol/L (ref 3.5–5.1)
SODIUM: 136 mmol/L (ref 135–145)
Total Bilirubin: 0.3 mg/dL (ref 0.3–1.2)
Total Protein: 6 g/dL — ABNORMAL LOW (ref 6.5–8.1)

## 2016-01-02 LAB — CBC
HCT: 34.2 % — ABNORMAL LOW (ref 39.0–52.0)
Hemoglobin: 11 g/dL — ABNORMAL LOW (ref 13.0–17.0)
MCH: 27 pg (ref 26.0–34.0)
MCHC: 32.2 g/dL (ref 30.0–36.0)
MCV: 84 fL (ref 78.0–100.0)
PLATELETS: 409 10*3/uL — AB (ref 150–400)
RBC: 4.07 MIL/uL — ABNORMAL LOW (ref 4.22–5.81)
RDW: 14.2 % (ref 11.5–15.5)
WBC: 16.6 10*3/uL — ABNORMAL HIGH (ref 4.0–10.5)

## 2016-01-02 LAB — URIC ACID: Uric Acid, Serum: 4.9 mg/dL (ref 4.4–7.6)

## 2016-01-02 MED ORDER — VINCRISTINE SULFATE CHEMO INJECTION 1 MG/ML
Freq: Once | INTRAVENOUS | Status: AC
  Administered 2016-01-02: 13:00:00 via INTRAVENOUS
  Filled 2016-01-02: qty 10

## 2016-01-02 MED ORDER — SODIUM CHLORIDE 0.9 % IV SOLN
Freq: Once | INTRAVENOUS | Status: AC
  Administered 2016-01-02: 8 mg via INTRAVENOUS
  Filled 2016-01-02: qty 4

## 2016-01-02 NOTE — Progress Notes (Signed)
Chemo calculations done with Lottie Dawson RN

## 2016-01-02 NOTE — Care Management Note (Signed)
Case Management Note  Patient Details  Name: Erik Vaughn MRN: [id number] Date of Birth: Oct 19, 1950  Subjective/Objective:        65 yo admitted with Newly diagnosed aggressive B-cell lymphoma. Admitted for first cycle of EPOCH-R            Action/Plan: From home with spouse. CM following for DC needs.  Expected Discharge Date:                  Expected Discharge Plan:  Home/Self Care  In-House Referral:     Discharge planning Services  CM Consult  Post Acute Care Choice:    Choice offered to:     DME Arranged:    DME Agency:     HH Arranged:    HH Agency:     Status of Service:  In process, will continue to follow  If discussed at Long Length of Stay Meetings, dates discussed:    Additional Comments:  Lynnell Catalan, RN 01/02/2016, 1:29 PM

## 2016-01-03 ENCOUNTER — Other Ambulatory Visit: Payer: Self-pay | Admitting: Hematology

## 2016-01-03 LAB — CBC
HEMATOCRIT: 31.5 % — AB (ref 39.0–52.0)
Hemoglobin: 10.4 g/dL — ABNORMAL LOW (ref 13.0–17.0)
MCH: 27.4 pg (ref 26.0–34.0)
MCHC: 33 g/dL (ref 30.0–36.0)
MCV: 82.9 fL (ref 78.0–100.0)
Platelets: 368 10*3/uL (ref 150–400)
RBC: 3.8 MIL/uL — ABNORMAL LOW (ref 4.22–5.81)
RDW: 14.1 % (ref 11.5–15.5)
WBC: 14 10*3/uL — ABNORMAL HIGH (ref 4.0–10.5)

## 2016-01-03 LAB — COMPREHENSIVE METABOLIC PANEL
ALBUMIN: 3 g/dL — AB (ref 3.5–5.0)
ALT: 39 U/L (ref 17–63)
ANION GAP: 8 (ref 5–15)
AST: 24 U/L (ref 15–41)
Alkaline Phosphatase: 75 U/L (ref 38–126)
BILIRUBIN TOTAL: 0.5 mg/dL (ref 0.3–1.2)
BUN: 24 mg/dL — ABNORMAL HIGH (ref 6–20)
CO2: 27 mmol/L (ref 22–32)
Calcium: 8.7 mg/dL — ABNORMAL LOW (ref 8.9–10.3)
Chloride: 101 mmol/L (ref 101–111)
Creatinine, Ser: 0.72 mg/dL (ref 0.61–1.24)
GFR calc Af Amer: >60 mL/min (ref >60)
GFR calc non Af Amer: >60 mL/min (ref >60)
GLUCOSE: 137 mg/dL — AB (ref 65–99)
POTASSIUM: 4.2 mmol/L (ref 3.5–5.1)
SODIUM: 136 mmol/L (ref 135–145)
TOTAL PROTEIN: 5.5 g/dL — AB (ref 6.5–8.1)

## 2016-01-03 LAB — URIC ACID: Uric Acid, Serum: 5.6 mg/dL (ref 4.4–7.6)

## 2016-01-03 LAB — GLUCOSE, CAPILLARY: GLUCOSE-CAPILLARY: 120 mg/dL — AB (ref 65–99)

## 2016-01-03 MED ORDER — VINCRISTINE SULFATE CHEMO INJECTION 1 MG/ML: Freq: Once | INTRAVENOUS | Status: DC

## 2016-01-03 MED ORDER — OXYCODONE HCL ER 10 MG PO T12A
20.0000 mg | EXTENDED_RELEASE_TABLET | Freq: Two times a day (BID) | ORAL | Status: DC
Start: 2016-01-03 — End: 2016-01-04
  Administered 2016-01-03 – 2016-01-04 (×2): 20 mg via ORAL

## 2016-01-03 MED ORDER — VINCRISTINE SULFATE CHEMO INJECTION 1 MG/ML
Freq: Once | INTRAVENOUS | Status: AC
  Administered 2016-01-03: 12:00:00 via INTRAVENOUS
  Filled 2016-01-03: qty 10

## 2016-01-03 MED ORDER — SODIUM CHLORIDE 0.9 % IV SOLN
Freq: Once | INTRAVENOUS | Status: AC
  Administered 2016-01-03: 8 mg via INTRAVENOUS
  Filled 2016-01-03: qty 4

## 2016-01-03 MED ORDER — DOCUSATE SODIUM 100 MG PO CAPS
100.0000 mg | ORAL_CAPSULE | Freq: Two times a day (BID) | ORAL | Status: DC
Start: 2016-01-03 — End: 2016-01-05
  Administered 2016-01-03 – 2016-01-04 (×3): 100 mg via ORAL

## 2016-01-03 MED ORDER — CLOPIDOGREL BISULFATE 75 MG PO TABS: 75.0000 mg | ORAL_TABLET | Freq: Every day | ORAL | Status: DC

## 2016-01-03 NOTE — Progress Notes (Signed)
[name]   HEMATOLOGY/ONCOLOGY INPATIENT PROGRESS NOTE  Date of Service: 01/02/2016 Inpatient Attending: .Brunetta Genera, MD   SUBJECTIVE  Patient was seen late this evening. Continues to tolerate D2 EPOCH-R without any new concerns. No fevers/chills. No nausea. Sitting comfortably chatting with family.   OBJECTIVE:  NAD  PHYSICAL EXAMINATION: . Vitals:   01/02/16 0426 01/02/16 1549 01/02/16 1848 01/02/16 2121  BP: 120/77 131/73 120/77 119/64  Pulse: 78 79 96 87  Resp: '16 18 16 16  '$ Temp: 97.5 F (36.4 C) 98.1 F (36.7 C) 98.2 F (36.8 C) 98.1 F (36.7 C)  TempSrc: Oral Oral Oral Oral  SpO2: 95% 95% 95% 97%  Weight:      Height:       Filed Weights   01/01/16 0855  Weight: 190 lb (86.2 kg)   .Body mass index is 25.07 kg/m. GENERAL:alert, in no acute distress and comfortable SKIN: skin color, texture, turgor are normal, no rashes or significant lesions EYES: normal, conjunctiva are pink and non-injected, sclera clear OROPHARYNX:no exudate, no erythema and lips, buccal mucosa, and tongue normal  NECK: supple, no JVD, thyroid normal size, non-tender, without nodularity LYMPH: no palpable lymphadenopathy in the cervical, axillary or inguinal LUNGS: clear to auscultation with normal respiratory effort, large left anterior chest wall mass noted appears nontender and fixed. HEART: regular rate &rhythm, no murmurs and no lower extremity edema ABDOMEN: abdomen soft, non-tender, normoactive bowel sounds  Musculoskeletal: no cyanosis of digits and no clubbing  PSYCH: alert & oriented x 3 with fluent speech NEURO: no focal motor/sensory deficits  MEDICAL HISTORY:  Past Medical History:  Diagnosis Date  . Hypertension   . PAD (peripheral artery disease) (HCC) left leg    SURGICAL HISTORY: Past Surgical History:  Procedure Laterality Date  . BRAIN SURGERY    . IR GENERIC HISTORICAL  12/31/2015   IR FLUORO GUIDE CV LINE RIGHT 12/31/2015 Arne Cleveland, MD [facility]    . IR GENERIC HISTORICAL  12/31/2015   IR US GUIDE VASC ACCESS RIGHT 12/31/2015 Arne Cleveland, MD [facility]  . left salivary Left    removed    SOCIAL HISTORY: Social History   Social History  . Marital status: Married    Spouse name: N/A  . Number of children: N/A  . Years of education: N/A   Occupational History  . Not on file.   Social History Main Topics  . Smoking status: Former Smoker    Packs/day: 2.00    Quit date: 12/22/2005  . Smokeless tobacco: Never Used  . Alcohol use Yes     Comment: occasionally  . Drug use: Unknown  . Sexual activity: Not on file   Other Topics Concern  . Not on file   Social History Narrative  . No narrative on file    FAMILY HISTORY: History reviewed. No pertinent family history.  ALLERGIES:  has No Known Allergies.  MEDICATIONS:  Scheduled Meds: . allopurinol  300 mg Oral Daily  . amLODipine  10 mg Oral QPM  . aspirin EC  81 mg Oral QHS  . DOXOrubicin/vinCRIStine/etoposide CHEMO IV infusion for Inpatient CI   Intravenous Once  . enoxaparin (LOVENOX) injection  40 mg Subcutaneous Q24H  . lisinopril  20 mg Oral QPM  . oxyCODONE  20 mg Oral Q12H  . pantoprazole  40 mg Oral Daily  . polyethylene glycol  17 g Oral BID  . predniSONE  80 mg Oral QAC breakfast  . senna-docusate  2 tablet Oral QHS  Continuous Infusions: . sodium chloride 20 mL/hr at 01/01/16 1100   PRN Meds:.acetaminophen, alteplase, Cold Pack, heparin lock flush, heparin lock flush, Hot Pack, LORazepam, oxyCODONE-acetaminophen, sodium chloride flush, sodium chloride flush  REVIEW OF SYSTEMS:    10 Point review of Systems was done is negative except as noted above.   LABORATORY DATA:  I have reviewed the data as listed  . CBC Latest Ref Rng & Units 01/02/2016 01/01/2016 12/31/2015  WBC 4.0 - 10.5 K/uL 16.6(H) 13.7(H) 14.4(H)  Hemoglobin 13.0 - 17.0 g/dL 11.0(L) 11.2(L) 11.8(L)  Hematocrit 39.0 - 52.0 % 34.2(L) 33.7(L) 35.5(L)  Platelets 150 - 400 K/uL  409(H) 424(H) 440(H)    . CMP Latest Ref Rng & Units 01/02/2016 01/01/2016 12/25/2015  Glucose 65 - 99 mg/dL 140(H) 96 135  BUN 6 - 20 mg/dL 22(H) 20 15.9  Creatinine 0.61 - 1.24 mg/dL 0.80 0.52(L) 0.8  Sodium 135 - 145 mmol/L 136 134(L) 137  Potassium 3.5 - 5.1 mmol/L 4.9 4.4 4.1  Chloride 101 - 111 mmol/L 101 96(L) -  CO2 22 - 32 mmol/L '29 29 29  '$ Calcium 8.9 - 10.3 mg/dL 9.3 9.0 10.3  Total Protein 6.5 - 8.1 g/dL 6.0(L) 6.5 7.5  Total Bilirubin 0.3 - 1.2 mg/dL 0.3 0.5 0.39  Alkaline Phos 38 - 126 U/L 72 84 110  AST 15 - 41 U/L '23 28 20  '$ ALT 17 - 63 U/L 41 48 19     RADIOGRAPHIC STUDIES: I have personally reviewed the radiological images as listed and agreed with the findings in the report. Dg Chest 2 View  Result Date: 12/18/2015 CLINICAL DATA:  Metastatic cancer.  Left chest wall mass. EXAM: CHEST  2 VIEW COMPARISON:  None. FINDINGS: Heart size is normal. There is aortic atherosclerosis. No abnormality is seen in the right chest. On the frontal view, there is abnormal density projected over the left lower chest. There is lytic destruction of the left ninth rib posteriorly. There is a posterior chest wall mass measuring at least 5.7 cm in diameter seen on the lateral. Additionally, there is in the anterior retrosternal density on the lateral that could also be a chest wall mass anteriorly. No effusions. No spinal fracture seen. IMPRESSION: Lytic destructive lesion of the ninth rib on the left posteriorly. Abnormal chest wall density posteriorly with a mass measuring at least 5.7 cm. Possible anterior chest wall mass as well. Electronically Signed   By: Nelson Chimes M.D.   On: 12/18/2015 13:59   Nm Pet Image Initial (pi) Skull Base To Thigh  Result Date: 12/25/2015 CLINICAL DATA:  Initial treatment strategy for diffuse large B-cell lymphoma with osseous metastases. EXAM: NUCLEAR MEDICINE PET SKULL BASE TO THIGH TECHNIQUE: 9.29 mCi F-18 FDG was injected intravenously. Full-ring PET imaging  was performed from the skull base to thigh after the radiotracer. CT data was obtained and used for attenuation correction and anatomic localization. FASTING BLOOD GLUCOSE:  Value: 123 mg/dl COMPARISON:  None. FINDINGS: NECK No hypermetabolic lymph nodes in the neck. CHEST 13 x 11 mm posterior right upper lobe/ right perihilar nodule (series 8/ image 25), max SUV 18.4, suspicious for pulmonary metastasis. Trace left pleural effusion. 2.3 x 1.9 cm left paratracheal node (series 4/ image 57), max SUV 36.8. Additional 8 mm short axis left infrahilar node (series 4/ image 80), max SUV 13.0. Additional small mediastinal lymph nodes, although without appreciable hypermetabolism. Three vessel coronary atherosclerosis. Mild atherosclerotic calcifications of the abdominal aorta. ABDOMEN/PELVIS No abnormal hypermetabolic activity within the  liver, pancreas, adrenal glands, or spleen. 7.1 x 10.6 cm aggregate nodal mass in the right mid abdomen (series 4/ image 147), max SUV 41.5. 6.4 x 5.3 cm left external iliac nodal mass (series 4/image 137), max SUV 38.7. Additional foci of hypermetabolism along the right anterior abdominal wall and right abdominal mesentery. Prostatomegaly, with enlargement of the central gland which indents the base of the bladder. Mildly thick-walled bladder. Atherosclerotic calcifications the abdominal aorta and branch vessels. SKELETON 4.9 x 6.8 cm left anterior chest wall metastasis involving the right anterior 2nd and 3rd rib (series 4/ image 80), max SUV 37.2. 4.8 x 5.9 cm left posterior chest wall metastasis (series 4/ image 91), with associated pathologic fracture of the left posterior 9th rib, max SUV 48.6. Left sacral metastasis (series 4/ image 163), max SUV 31.1. Additional hypermetabolism involving the left acetabulum and iliac bone. Right proximal humeral metastasis, max SUV 39.9. IMPRESSION: Widespread lymphoma in the chest, abdomen, and pelvis, as above. 13 mm posterior right upper lobe  pulmonary nodule, suspicious for pulmonary metastasis. Upper thoracic and left perihilar nodal lymphoma. Large nodal masses in the right mid abdomen and left pelvis. Osseous metastases involving the left anterior and left posterior chest wall, with pathologic fracture of the left posterior 9th rib. Additional osseous metastases involving the left sacral and right proximal humerus. Additional ancillary findings as above. Electronically Signed   By: Julian Hy M.D.   On: 12/25/2015 10:53  US Biopsy  Result Date: 12/23/2015 CLINICAL DATA:  Large mass of the left chest wall as well as destructive lesion of the left ninth rib and sacral mass. The patient presents for biopsy of the chest wall mass. EXAM: ULTRASOUND GUIDED CORE BIOPSY OF LEFT CHEST WALL MASS MEDICATIONS: 1.0 mg IV Versed; 50 mcg IV Fentanyl Total Moderate Sedation Time: 9 minutes. The patient's level of consciousness and physiologic status were continuously monitored during the procedure by Radiology nursing. PROCEDURE: The procedure, risks, benefits, and alternatives were explained to the patient. Questions regarding the procedure were encouraged and answered. The patient understands and consents to the procedure. A time-out was performed prior to initiating the procedure. The left chest wall was prepped with chlorhexidine in a sterile fashion, and a sterile drape was applied covering the operative field. A sterile gown and sterile gloves were used for the procedure. Local anesthesia was provided with 1% Lidocaine. A left chest wall mass was localized by ultrasound. Seven separate 16 gauge core biopsy samples were obtained and submitted in both saline and formalin. Post biopsy imaging was performed with ultrasound. COMPLICATIONS: None. FINDINGS: Large hypoechoic, solid oval shaped left chest wall mass is identified in the left pectoral region. Solid tissue was obtained. IMPRESSION: Ultrasound-guided core biopsy performed of a large left chest  wall mass. Electronically Signed   By: Aletta Edouard M.D.   On: 12/23/2015 16:50  Ir Fluoro Guide Cv Line Right  Result Date: 12/31/2015 CLINICAL DATA:  Diffuse large B-cell lymphoma, needs durable venous access for chemotherapy EXAM: TUNNELED PORT CATHETER PLACEMENT WITH ULTRASOUND AND FLUOROSCOPIC GUIDANCE FLUOROSCOPY TIME:  6 seconds, 2 mGy ANESTHESIA/SEDATION: Intravenous Fentanyl and Versed were administered as conscious sedation during continuous monitoring of the patient's level of consciousness and physiological / cardiorespiratory status by the radiology RN, with a total moderate sedation time of 15 minutes. TECHNIQUE: The procedure, risks, benefits, and alternatives were explained to the patient. Questions regarding the procedure were encouraged and answered. The patient understands and consents to the procedure. As antibiotic prophylaxis, cefazolin 2  g was ordered pre-procedure and administered intravenously within one hour of incision. Patency of the right IJ vein was confirmed with ultrasound with image documentation. An appropriate skin site was determined. Skin site was marked. Region was prepped using maximum barrier technique including cap and mask, sterile gown, sterile gloves, large sterile sheet, and Chlorhexidine as cutaneous antisepsis. The region was infiltrated locally with 1% lidocaine. Under real-time ultrasound guidance, the right IJ vein was accessed with a 21 gauge micropuncture needle; the needle tip within the vein was confirmed with ultrasound image documentation. Needle was exchanged over a 018 guidewire for transitional dilator which allowed passage of the [facility] wire into the IVC. Over this, the transitional dilator was exchanged for a 5 [place] MPA catheter. A small incision was made on the right anterior chest wall and a subcutaneous pocket fashioned. The power-injectable port was positioned and its catheter tunneled to the right IJ dermatotomy site. The MPA catheter was  exchanged over an Amplatz wire for a peel-away sheath, through which the port catheter, which had been trimmed to the appropriate length, was advanced and positioned under fluoroscopy with its tip at the cavoatrial junction. Spot chest radiograph confirms good catheter position and no pneumothorax. The pocket was closed with deep interrupted and subcuticular continuous 3-0 Monocryl sutures. The port was flushed per protocol. The incisions were covered with Dermabond then covered with a sterile dressing. COMPLICATIONS: COMPLICATIONS None immediate IMPRESSION: Technically successful right IJ power-injectable port catheter placement. Ready for routine use. Electronically Signed   By: Lucrezia Europe M.D.   On: 12/31/2015 10:20   Ir US Guide Vasc Access Right  Result Date: 12/31/2015 CLINICAL DATA:  Diffuse large B-cell lymphoma, needs durable venous access for chemotherapy EXAM: TUNNELED PORT CATHETER PLACEMENT WITH ULTRASOUND AND FLUOROSCOPIC GUIDANCE FLUOROSCOPY TIME:  6 seconds, 2 mGy ANESTHESIA/SEDATION: Intravenous Fentanyl and Versed were administered as conscious sedation during continuous monitoring of the patient's level of consciousness and physiological / cardiorespiratory status by the radiology RN, with a total moderate sedation time of 15 minutes. TECHNIQUE: The procedure, risks, benefits, and alternatives were explained to the patient. Questions regarding the procedure were encouraged and answered. The patient understands and consents to the procedure. As antibiotic prophylaxis, cefazolin 2 g was ordered pre-procedure and administered intravenously within one hour of incision. Patency of the right IJ vein was confirmed with ultrasound with image documentation. An appropriate skin site was determined. Skin site was marked. Region was prepped using maximum barrier technique including cap and mask, sterile gown, sterile gloves, large sterile sheet, and Chlorhexidine as cutaneous antisepsis. The region was  infiltrated locally with 1% lidocaine. Under real-time ultrasound guidance, the right IJ vein was accessed with a 21 gauge micropuncture needle; the needle tip within the vein was confirmed with ultrasound image documentation. Needle was exchanged over a 018 guidewire for transitional dilator which allowed passage of the [facility] wire into the IVC. Over this, the transitional dilator was exchanged for a 5 [place] MPA catheter. A small incision was made on the right anterior chest wall and a subcutaneous pocket fashioned. The power-injectable port was positioned and its catheter tunneled to the right IJ dermatotomy site. The MPA catheter was exchanged over an Amplatz wire for a peel-away sheath, through which the port catheter, which had been trimmed to the appropriate length, was advanced and positioned under fluoroscopy with its tip at the cavoatrial junction. Spot chest radiograph confirms good catheter position and no pneumothorax. The pocket was closed with deep interrupted and subcuticular continuous  3-0 Monocryl sutures. The port was flushed per protocol. The incisions were covered with Dermabond then covered with a sterile dressing. COMPLICATIONS: COMPLICATIONS None immediate IMPRESSION: Technically successful right IJ power-injectable port catheter placement. Ready for routine use. Electronically Signed   By: Lucrezia Europe M.D.   On: 12/31/2015 10:20   Ct Biopsy  Result Date: 12/31/2015 CLINICAL DATA:  Diffuse large B-cell lymphoma, osseous metastases EXAM: CT GUIDED DEEP ILIAC BONE ASPIRATION AND CORE BIOPSY TECHNIQUE: The procedure, risks (including but not limited to bleeding, infection, organ damage ), benefits, and alternatives were explained to the patient. Questions regarding the procedure were encouraged and answered. The patient understands and consents to the procedure. Patient was placed supine on the CT gantry and limited axial scans through the pelvis were obtained. Appropriate skin entry site was  identified. Skin site was marked, prepped with Betadine, draped in usual sterile fashion, and infiltrated locally with 1% lidocaine. Intravenous Fentanyl and Versed were administered as conscious sedation during continuous monitoring of the patient's level of consciousness and physiological / cardiorespiratory status by the radiology RN, with a total moderate sedation time of 9 minutes. Under CT fluoroscopic guidance a trocar bone needle was advanced into the right iliac bone just lateral to the sacroiliac joint using the OncoTrol device. Once needle tip position was confirmed, coaxial core and aspiration samples were obtained. The final sample was obtained using the guiding needle itself, which was then removed. Post procedure scans show no hematoma or fracture. Patient tolerated procedure well. COMPLICATIONS: COMPLICATIONS none IMPRESSION: 1. Technically successful CT guided right iliac bone core and aspiration biopsy. Electronically Signed   By: Lucrezia Europe M.D.   On: 12/31/2015 10:20   Mr Outside Films Spine  Result Date: 12/18/2015 CLINICAL DATA:  This exam is stored here for comparison purposes only and was performed at an outside facility.   Please contact the originating institution for any associated interpretation or report.   Dg Fluoro Guide Lumbar Puncture  Result Date: 01/01/2016 CLINICAL DATA:  65 year old male with Burkitt's lymphoma. Subsequent encounter. EXAM: DIAGNOSTIC LUMBAR PUNCTURE UNDER FLUOROSCOPIC GUIDANCE FLUOROSCOPY TIME:  If the device does not provide the exposure index: Fluoroscopy Time (in minutes and seconds):  20 second Number of Acquired Images:  2 PROCEDURE: Case discussed with staff. No clinical suspicion for intracranial abnormality. Informed consent was obtained from the patient prior to the procedure, including potential complications of headache, bleeding allergy, and pain. Time-out performed. With the patient prone, the lower back was prepped with Betadine. 1% Lidocaine  was used for local anesthesia. Lumbar puncture was performed at the L2-3 level using a 22 gauge needle with return of initially minimally blood tinged CSF which cleared with an opening pressure of 12 cm water. Ten ml of CSF were obtained for laboratory studies. The patient tolerated the procedure well and there were no apparent complications. IMPRESSION: L2-3 lumbar puncture with collection of 10 cc of cerebral spinal fluid sent for labs as per request. Electronically Signed   By: Genia Del M.D.   On: 01/01/2016 14:01    ASSESSMENT & PLAN:  #1 Stage IVB high risk Burkitt's Lymphoma. Discussed with Dr Tresa Moore - LN biopsy confirmed presence of t(8;14) translocation consistent with Burkitts Lymphoma. [place] breakapart event on Fish panel but negative for BCL-2 and BCL 6.  This appears to be involving the sacrum causing sacral and root involvement with possible neurogenic bladder. PET CT scan as noted above shows extensive involvement with lymphoma.  CSF cytology neg for involvement  with Burkitts lymphoma and patient has no neurological symptoms suggesting Burkitts involvement.  BM Bx - no evidence of involvement with Burkitts lymphoma  Echo showed normal ejection fraction 65-70%.  Plan -labs stable. -continue EPOCH-R as per plan -Will get Neulasta on day 6 as outpatient. -Daily CBC, CMP, uric acid -will likely start IT MTX prophylaxis with C2 or C3 - Continue on on allopurinol for tumor lysis syndrome prophylaxis . Uric acid level WNL today.  #2 left sacral ala and S1 fracture. L5-S1 and S1-S2 left-sided nerve root compression. #3 neoplasm related pain Plan -continue OxyContin 20 mg by mouth twice a day and the Ativan and Percocet for breakthrough pain. -Patient notes his pain is getting better and he is hardly uses when necessary Percocet for breakthrough pain. -As pain is controlled we'll wean off his OxyContin.   #4 Hypertension, dyslipidemia, peripheral arterial  disease -Reconciled appropriate medications. -Plavix was on hold for port placement and lumbar puncture. We will restart in on Monday if no issues with bleeding. -will need to plan to hold with future IT Mtx doses as well. -On aspirin . -Holding cholesterol medicine nonessential medications during chemotherapy .  #5 ex-smoker with 80-pack-year history of smoking.    I spent 40 minutes counseling the patient face to face. The total time spent in the appointment was 40 minutes and more than 50% was on counseling and direct patient cares.    Sullivan Lone MD [place] AAHIVMS [facility] Hematology/Oncology Physician [facility]  (Office):       [phone] (Work cell):  [phone] (Fax):           [phone]

## 2016-01-03 NOTE — Progress Notes (Signed)
Patient using medications from home.  Pharmacy noticed most oral medications were being charted as "Not Given". Upon questioning the nurse today, was informed the patient's wife is bringing meds from home and insists on using them. Patient is on day 4 of a 5 day admission for chemotherapy. I discussed the rationale for our home medication policy and gave them a copy. The wife insisted they would keep using these medications for the remainder of the hospitalization. Her main concern is what the hospital charges for medications.  I identified all of the medications and they were correct. I discovered he takes docusate with his Oxycontin so I added that to the home med list and inpatient orders. I explained at the very least, we need to have a record of what is administered in order to treat him and keep him safe. I also explained we cannot allow this for future admissions. I advised that if they had further concerns they should discuss them with [facility] administration, her doctor, and their insurance company. The patient and his wife voiced understanding.  Romeo Rabon, PharmD, pager [phone]. 01/03/2016,11:16 AM.

## 2016-01-03 NOTE — Progress Notes (Signed)
Chemotherapy dosage and calculations checked and reviewed with Laural Benes, RN.

## 2016-01-03 NOTE — Progress Notes (Signed)
[name]   HEMATOLOGY/ONCOLOGY INPATIENT PROGRESS NOTE  Date of Service: 01/03/2016 Inpatient Attending: .Brunetta Genera, MD   SUBJECTIVE  Patient notes that the left chest wall has already shrunk significantly in size. No overt issues with chemotherapy. No nausea. Good appetite with the steroids. No other acute new symptoms. Sitting up comfortably reading news.   OBJECTIVE:  NAD  PHYSICAL EXAMINATION: . Vitals:   01/02/16 1848 01/02/16 2121 01/03/16 0612 01/03/16 1334  BP: 120/77 119/64 (!) 141/74 122/67  Pulse: 96 87 66 90  Resp: '16 16 16 18  '$ Temp: 98.2 F (36.8 C) 98.1 F (36.7 C) 97.4 F (36.3 C) 97.4 F (36.3 C)  TempSrc: Oral Oral Oral Oral  SpO2: 95% 97% 95% 95%  Weight:   190 lb 11.2 oz (86.5 kg)   Height:       Filed Weights   01/01/16 0855 01/03/16 0612  Weight: 190 lb (86.2 kg) 190 lb 11.2 oz (86.5 kg)   .Body mass index is 25.16 kg/m.   GENERAL:alert, in no acute distress and comfortable SKIN: skin color, texture, turgor are normal, no rashes or significant lesions EYES: normal, conjunctiva are pink and non-injected, sclera clear OROPHARYNX:no exudate, no erythema and lips, buccal mucosa, and tongue normal  NECK: supple, no JVD, thyroid normal size, non-tender, without nodularity LYMPH: no palpable lymphadenopathy in the cervical, axillary or inguinal LUNGS: clear to auscultation with normal respiratory effort, large left anterior chest wall mass appears to have shrunk significantly HEART: regular rate &rhythm, no murmurs and no lower extremity edema ABDOMEN: abdomen soft, non-tender, normoactive bowel sounds  Musculoskeletal: no cyanosis of digits and no clubbing  PSYCH: alert & oriented x 3 with fluent speech NEURO: no focal motor/sensory deficits  MEDICAL HISTORY:  Past Medical History:  Diagnosis Date  . Hypertension   . PAD (peripheral artery disease) (HCC) left leg    SURGICAL HISTORY: Past Surgical History:  Procedure Laterality Date    . BRAIN SURGERY    . IR GENERIC HISTORICAL  12/31/2015   IR FLUORO GUIDE CV LINE RIGHT 12/31/2015 Arne Cleveland, MD [facility]  . IR GENERIC HISTORICAL  12/31/2015   IR US GUIDE VASC ACCESS RIGHT 12/31/2015 Arne Cleveland, MD [facility]  . left salivary Left    removed    SOCIAL HISTORY: Social History   Social History  . Marital status: Married    Spouse name: N/A  . Number of children: N/A  . Years of education: N/A   Occupational History  . Not on file.   Social History Main Topics  . Smoking status: Former Smoker    Packs/day: 2.00    Quit date: 12/22/2005  . Smokeless tobacco: Never Used  . Alcohol use Yes     Comment: occasionally  . Drug use: Unknown  . Sexual activity: Not on file   Other Topics Concern  . Not on file   Social History Narrative  . No narrative on file    FAMILY HISTORY: History reviewed. No pertinent family history.  ALLERGIES:  has No Known Allergies.  MEDICATIONS:  Scheduled Meds: . allopurinol  300 mg Oral Daily  . amLODipine  10 mg Oral QPM  . aspirin EC  81 mg Oral QHS  . docusate sodium  100 mg Oral BID  . DOXOrubicin/vinCRIStine/etoposide CHEMO IV infusion for Inpatient CI   Intravenous Once  . enoxaparin (LOVENOX) injection  40 mg Subcutaneous Q24H  . lisinopril  20 mg Oral QPM  . oxyCODONE  20 mg Oral  Q12H  . pantoprazole  40 mg Oral Daily  . polyethylene glycol  17 g Oral BID  . predniSONE  80 mg Oral QAC breakfast  . senna-docusate  2 tablet Oral QHS   Continuous Infusions: . sodium chloride 20 mL/hr at 01/01/16 1100   PRN Meds:.acetaminophen, alteplase, Cold Pack, heparin lock flush, heparin lock flush, Hot Pack, LORazepam, oxyCODONE-acetaminophen, sodium chloride flush, sodium chloride flush  REVIEW OF SYSTEMS:    10 Point review of Systems was done is negative except as noted above.   LABORATORY DATA:  I have reviewed the data as listed  . CBC Latest Ref Rng & Units 01/03/2016 01/02/2016 01/01/2016  WBC 4.0 -  10.5 K/uL 14.0(H) 16.6(H) 13.7(H)  Hemoglobin 13.0 - 17.0 g/dL 10.4(L) 11.0(L) 11.2(L)  Hematocrit 39.0 - 52.0 % 31.5(L) 34.2(L) 33.7(L)  Platelets 150 - 400 K/uL 368 409(H) 424(H)    . CMP Latest Ref Rng & Units 01/03/2016 01/02/2016 01/01/2016  Glucose 65 - 99 mg/dL 137(H) 140(H) 96  BUN 6 - 20 mg/dL 24(H) 22(H) 20  Creatinine 0.61 - 1.24 mg/dL 0.72 0.80 0.52(L)  Sodium 135 - 145 mmol/L 136 136 134(L)  Potassium 3.5 - 5.1 mmol/L 4.2 4.9 4.4  Chloride 101 - 111 mmol/L 101 101 96(L)  CO2 22 - 32 mmol/L '27 29 29  '$ Calcium 8.9 - 10.3 mg/dL 8.7(L) 9.3 9.0  Total Protein 6.5 - 8.1 g/dL 5.5(L) 6.0(L) 6.5  Total Bilirubin 0.3 - 1.2 mg/dL 0.5 0.3 0.5  Alkaline Phos 38 - 126 U/L 75 72 84  AST 15 - 41 U/L '24 23 28  '$ ALT 17 - 63 U/L 39 41 48     RADIOGRAPHIC STUDIES: I have personally reviewed the radiological images as listed and agreed with the findings in the report. Dg Chest 2 View  Result Date: 12/18/2015 CLINICAL DATA:  Metastatic cancer.  Left chest wall mass. EXAM: CHEST  2 VIEW COMPARISON:  None. FINDINGS: Heart size is normal. There is aortic atherosclerosis. No abnormality is seen in the right chest. On the frontal view, there is abnormal density projected over the left lower chest. There is lytic destruction of the left ninth rib posteriorly. There is a posterior chest wall mass measuring at least 5.7 cm in diameter seen on the lateral. Additionally, there is in the anterior retrosternal density on the lateral that could also be a chest wall mass anteriorly. No effusions. No spinal fracture seen. IMPRESSION: Lytic destructive lesion of the ninth rib on the left posteriorly. Abnormal chest wall density posteriorly with a mass measuring at least 5.7 cm. Possible anterior chest wall mass as well. Electronically Signed   By: Nelson Chimes M.D.   On: 12/18/2015 13:59   Nm Pet Image Initial (pi) Skull Base To Thigh  Result Date: 12/25/2015 CLINICAL DATA:  Initial treatment strategy for diffuse  large B-cell lymphoma with osseous metastases. EXAM: NUCLEAR MEDICINE PET SKULL BASE TO THIGH TECHNIQUE: 9.29 mCi F-18 FDG was injected intravenously. Full-ring PET imaging was performed from the skull base to thigh after the radiotracer. CT data was obtained and used for attenuation correction and anatomic localization. FASTING BLOOD GLUCOSE:  Value: 123 mg/dl COMPARISON:  None. FINDINGS: NECK No hypermetabolic lymph nodes in the neck. CHEST 13 x 11 mm posterior right upper lobe/ right perihilar nodule (series 8/ image 25), max SUV 18.4, suspicious for pulmonary metastasis. Trace left pleural effusion. 2.3 x 1.9 cm left paratracheal node (series 4/ image 57), max SUV 36.8. Additional 8 mm short axis  left infrahilar node (series 4/ image 80), max SUV 13.0. Additional small mediastinal lymph nodes, although without appreciable hypermetabolism. Three vessel coronary atherosclerosis. Mild atherosclerotic calcifications of the abdominal aorta. ABDOMEN/PELVIS No abnormal hypermetabolic activity within the liver, pancreas, adrenal glands, or spleen. 7.1 x 10.6 cm aggregate nodal mass in the right mid abdomen (series 4/ image 147), max SUV 41.5. 6.4 x 5.3 cm left external iliac nodal mass (series 4/image 137), max SUV 38.7. Additional foci of hypermetabolism along the right anterior abdominal wall and right abdominal mesentery. Prostatomegaly, with enlargement of the central gland which indents the base of the bladder. Mildly thick-walled bladder. Atherosclerotic calcifications the abdominal aorta and branch vessels. SKELETON 4.9 x 6.8 cm left anterior chest wall metastasis involving the right anterior 2nd and 3rd rib (series 4/ image 80), max SUV 37.2. 4.8 x 5.9 cm left posterior chest wall metastasis (series 4/ image 91), with associated pathologic fracture of the left posterior 9th rib, max SUV 48.6. Left sacral metastasis (series 4/ image 163), max SUV 31.1. Additional hypermetabolism involving the left acetabulum and  iliac bone. Right proximal humeral metastasis, max SUV 39.9. IMPRESSION: Widespread lymphoma in the chest, abdomen, and pelvis, as above. 13 mm posterior right upper lobe pulmonary nodule, suspicious for pulmonary metastasis. Upper thoracic and left perihilar nodal lymphoma. Large nodal masses in the right mid abdomen and left pelvis. Osseous metastases involving the left anterior and left posterior chest wall, with pathologic fracture of the left posterior 9th rib. Additional osseous metastases involving the left sacral and right proximal humerus. Additional ancillary findings as above. Electronically Signed   By: Julian Hy M.D.   On: 12/25/2015 10:53  US Biopsy  Result Date: 12/23/2015 CLINICAL DATA:  Large mass of the left chest wall as well as destructive lesion of the left ninth rib and sacral mass. The patient presents for biopsy of the chest wall mass. EXAM: ULTRASOUND GUIDED CORE BIOPSY OF LEFT CHEST WALL MASS MEDICATIONS: 1.0 mg IV Versed; 50 mcg IV Fentanyl Total Moderate Sedation Time: 9 minutes. The patient's level of consciousness and physiologic status were continuously monitored during the procedure by Radiology nursing. PROCEDURE: The procedure, risks, benefits, and alternatives were explained to the patient. Questions regarding the procedure were encouraged and answered. The patient understands and consents to the procedure. A time-out was performed prior to initiating the procedure. The left chest wall was prepped with chlorhexidine in a sterile fashion, and a sterile drape was applied covering the operative field. A sterile gown and sterile gloves were used for the procedure. Local anesthesia was provided with 1% Lidocaine. A left chest wall mass was localized by ultrasound. Seven separate 16 gauge core biopsy samples were obtained and submitted in both saline and formalin. Post biopsy imaging was performed with ultrasound. COMPLICATIONS: None. FINDINGS: Large hypoechoic, solid oval  shaped left chest wall mass is identified in the left pectoral region. Solid tissue was obtained. IMPRESSION: Ultrasound-guided core biopsy performed of a large left chest wall mass. Electronically Signed   By: Aletta Edouard M.D.   On: 12/23/2015 16:50  Ir Fluoro Guide Cv Line Right  Result Date: 12/31/2015 CLINICAL DATA:  Diffuse large B-cell lymphoma, needs durable venous access for chemotherapy EXAM: TUNNELED PORT CATHETER PLACEMENT WITH ULTRASOUND AND FLUOROSCOPIC GUIDANCE FLUOROSCOPY TIME:  6 seconds, 2 mGy ANESTHESIA/SEDATION: Intravenous Fentanyl and Versed were administered as conscious sedation during continuous monitoring of the patient's level of consciousness and physiological / cardiorespiratory status by the radiology RN, with a total moderate sedation  time of 15 minutes. TECHNIQUE: The procedure, risks, benefits, and alternatives were explained to the patient. Questions regarding the procedure were encouraged and answered. The patient understands and consents to the procedure. As antibiotic prophylaxis, cefazolin 2 g was ordered pre-procedure and administered intravenously within one hour of incision. Patency of the right IJ vein was confirmed with ultrasound with image documentation. An appropriate skin site was determined. Skin site was marked. Region was prepped using maximum barrier technique including cap and mask, sterile gown, sterile gloves, large sterile sheet, and Chlorhexidine as cutaneous antisepsis. The region was infiltrated locally with 1% lidocaine. Under real-time ultrasound guidance, the right IJ vein was accessed with a 21 gauge micropuncture needle; the needle tip within the vein was confirmed with ultrasound image documentation. Needle was exchanged over a 018 guidewire for transitional dilator which allowed passage of the [facility] wire into the IVC. Over this, the transitional dilator was exchanged for a 5 [place] MPA catheter. A small incision was made on the right anterior  chest wall and a subcutaneous pocket fashioned. The power-injectable port was positioned and its catheter tunneled to the right IJ dermatotomy site. The MPA catheter was exchanged over an Amplatz wire for a peel-away sheath, through which the port catheter, which had been trimmed to the appropriate length, was advanced and positioned under fluoroscopy with its tip at the cavoatrial junction. Spot chest radiograph confirms good catheter position and no pneumothorax. The pocket was closed with deep interrupted and subcuticular continuous 3-0 Monocryl sutures. The port was flushed per protocol. The incisions were covered with Dermabond then covered with a sterile dressing. COMPLICATIONS: COMPLICATIONS None immediate IMPRESSION: Technically successful right IJ power-injectable port catheter placement. Ready for routine use. Electronically Signed   By: Lucrezia Europe M.D.   On: 12/31/2015 10:20   Ir US Guide Vasc Access Right  Result Date: 12/31/2015 CLINICAL DATA:  Diffuse large B-cell lymphoma, needs durable venous access for chemotherapy EXAM: TUNNELED PORT CATHETER PLACEMENT WITH ULTRASOUND AND FLUOROSCOPIC GUIDANCE FLUOROSCOPY TIME:  6 seconds, 2 mGy ANESTHESIA/SEDATION: Intravenous Fentanyl and Versed were administered as conscious sedation during continuous monitoring of the patient's level of consciousness and physiological / cardiorespiratory status by the radiology RN, with a total moderate sedation time of 15 minutes. TECHNIQUE: The procedure, risks, benefits, and alternatives were explained to the patient. Questions regarding the procedure were encouraged and answered. The patient understands and consents to the procedure. As antibiotic prophylaxis, cefazolin 2 g was ordered pre-procedure and administered intravenously within one hour of incision. Patency of the right IJ vein was confirmed with ultrasound with image documentation. An appropriate skin site was determined. Skin site was marked. Region was  prepped using maximum barrier technique including cap and mask, sterile gown, sterile gloves, large sterile sheet, and Chlorhexidine as cutaneous antisepsis. The region was infiltrated locally with 1% lidocaine. Under real-time ultrasound guidance, the right IJ vein was accessed with a 21 gauge micropuncture needle; the needle tip within the vein was confirmed with ultrasound image documentation. Needle was exchanged over a 018 guidewire for transitional dilator which allowed passage of the [facility] wire into the IVC. Over this, the transitional dilator was exchanged for a 5 [place] MPA catheter. A small incision was made on the right anterior chest wall and a subcutaneous pocket fashioned. The power-injectable port was positioned and its catheter tunneled to the right IJ dermatotomy site. The MPA catheter was exchanged over an Amplatz wire for a peel-away sheath, through which the port catheter, which had been trimmed  to the appropriate length, was advanced and positioned under fluoroscopy with its tip at the cavoatrial junction. Spot chest radiograph confirms good catheter position and no pneumothorax. The pocket was closed with deep interrupted and subcuticular continuous 3-0 Monocryl sutures. The port was flushed per protocol. The incisions were covered with Dermabond then covered with a sterile dressing. COMPLICATIONS: COMPLICATIONS None immediate IMPRESSION: Technically successful right IJ power-injectable port catheter placement. Ready for routine use. Electronically Signed   By: Lucrezia Europe M.D.   On: 12/31/2015 10:20   Ct Biopsy  Result Date: 12/31/2015 CLINICAL DATA:  Diffuse large B-cell lymphoma, osseous metastases EXAM: CT GUIDED DEEP ILIAC BONE ASPIRATION AND CORE BIOPSY TECHNIQUE: The procedure, risks (including but not limited to bleeding, infection, organ damage ), benefits, and alternatives were explained to the patient. Questions regarding the procedure were encouraged and answered. The patient  understands and consents to the procedure. Patient was placed supine on the CT gantry and limited axial scans through the pelvis were obtained. Appropriate skin entry site was identified. Skin site was marked, prepped with Betadine, draped in usual sterile fashion, and infiltrated locally with 1% lidocaine. Intravenous Fentanyl and Versed were administered as conscious sedation during continuous monitoring of the patient's level of consciousness and physiological / cardiorespiratory status by the radiology RN, with a total moderate sedation time of 9 minutes. Under CT fluoroscopic guidance a trocar bone needle was advanced into the right iliac bone just lateral to the sacroiliac joint using the OncoTrol device. Once needle tip position was confirmed, coaxial core and aspiration samples were obtained. The final sample was obtained using the guiding needle itself, which was then removed. Post procedure scans show no hematoma or fracture. Patient tolerated procedure well. COMPLICATIONS: COMPLICATIONS none IMPRESSION: 1. Technically successful CT guided right iliac bone core and aspiration biopsy. Electronically Signed   By: Lucrezia Europe M.D.   On: 12/31/2015 10:20   Mr Outside Films Spine  Result Date: 12/18/2015 CLINICAL DATA:  This exam is stored here for comparison purposes only and was performed at an outside facility.   Please contact the originating institution for any associated interpretation or report.   Dg Fluoro Guide Lumbar Puncture  Result Date: 01/01/2016 CLINICAL DATA:  65 year old male with Burkitt's lymphoma. Subsequent encounter. EXAM: DIAGNOSTIC LUMBAR PUNCTURE UNDER FLUOROSCOPIC GUIDANCE FLUOROSCOPY TIME:  If the device does not provide the exposure index: Fluoroscopy Time (in minutes and seconds):  20 second Number of Acquired Images:  2 PROCEDURE: Case discussed with staff. No clinical suspicion for intracranial abnormality. Informed consent was obtained from the patient prior to the  procedure, including potential complications of headache, bleeding allergy, and pain. Time-out performed. With the patient prone, the lower back was prepped with Betadine. 1% Lidocaine was used for local anesthesia. Lumbar puncture was performed at the L2-3 level using a 22 gauge needle with return of initially minimally blood tinged CSF which cleared with an opening pressure of 12 cm water. Ten ml of CSF were obtained for laboratory studies. The patient tolerated the procedure well and there were no apparent complications. IMPRESSION: L2-3 lumbar puncture with collection of 10 cc of cerebral spinal fluid sent for labs as per request. Electronically Signed   By: Genia Del M.D.   On: 01/01/2016 14:01    ASSESSMENT & PLAN:  #1 Stage IVB high risk Burkitt's Lymphoma. Discussed with Dr Tresa Moore - LN biopsy confirmed presence of t(8;14) translocation consistent with Burkitts Lymphoma. [place] breakapart event on Fish panel but negative for BCL-2  and BCL 6.  This appears to be involving the sacrum causing sacral and root involvement with possible neurogenic bladder. PET CT scan as noted above shows extensive involvement with lymphoma.  CSF cytology neg for involvement with Burkitts lymphoma and patient has no neurological symptoms suggesting Burkitts involvement.  BM Bx - no evidence of involvement with Burkitts lymphoma  Echo showed normal ejection fraction 65-70%.  Plan -labs stable. NO prohibitive toxicities -uric acid stable. -continue EPOCH-R as per plan should be completed on 01/05/2016. -Will get Neulasta on D6 as outpatient -Daily CBC, CMP, uric acid -will likely start IT MTX prophylaxis with C2 or C3 -Continue on on allopurinol for tumor lysis syndrome prophylaxis atleast for till cycle 2.  #2 left sacral ala and S1 fracture. L5-S1 and S1-S2 left-sided nerve root compression. #3 neoplasm related pain Plan -continue OxyContin 20 mg by mouth twice a day and the Ativan and Percocet for  breakthrough pain. -we shall start weaning as tolerated.  #4 Hypertension, dyslipidemia, peripheral arterial disease -Reconciled appropriate medications. -Plavix was on hold for port placement and lumbar puncture. We will restart tomorrow night -will need to plan to hold with future IT Mtx doses as well. -On aspirin  -Holding cholesterol medicine nonessential medications during chemotherapy .  #5 ex-smoker with 80-pack-year history of smoking.    I spent 25 minutes counseling the patient face to face. The total time spent in the appointment was 30 minutes and more than 50% was on counseling and direct patient cares.    Sullivan Lone MD [place] AAHIVMS [facility] Hematology/Oncology Physician [facility]  (Office):       [phone] (Work cell):  [phone] (Fax):           [phone]

## 2016-01-04 LAB — URIC ACID: URIC ACID, SERUM: 5.2 mg/dL (ref 4.4–7.6)

## 2016-01-04 LAB — COMPREHENSIVE METABOLIC PANEL
ALBUMIN: 3.3 g/dL — AB (ref 3.5–5.0)
ALK PHOS: 80 U/L (ref 38–126)
ALT: 42 U/L (ref 17–63)
ANION GAP: 8 (ref 5–15)
AST: 24 U/L (ref 15–41)
BILIRUBIN TOTAL: 0.7 mg/dL (ref 0.3–1.2)
BUN: 21 mg/dL — ABNORMAL HIGH (ref 6–20)
CALCIUM: 9.1 mg/dL (ref 8.9–10.3)
CO2: 29 mmol/L (ref 22–32)
Chloride: 100 mmol/L — ABNORMAL LOW (ref 101–111)
Creatinine, Ser: 0.73 mg/dL (ref 0.61–1.24)
GLUCOSE: 121 mg/dL — AB (ref 65–99)
POTASSIUM: 4 mmol/L (ref 3.5–5.1)
Sodium: 137 mmol/L (ref 135–145)
TOTAL PROTEIN: 6.1 g/dL — AB (ref 6.5–8.1)

## 2016-01-04 LAB — CBC
HEMATOCRIT: 35.5 % — AB (ref 39.0–52.0)
HEMOGLOBIN: 11.6 g/dL — AB (ref 13.0–17.0)
MCH: 26.8 pg (ref 26.0–34.0)
MCHC: 32.7 g/dL (ref 30.0–36.0)
MCV: 82 fL (ref 78.0–100.0)
Platelets: 386 10*3/uL (ref 150–400)
RBC: 4.33 MIL/uL (ref 4.22–5.81)
RDW: 13.7 % (ref 11.5–15.5)
WBC: 10.6 10*3/uL — ABNORMAL HIGH (ref 4.0–10.5)

## 2016-01-04 LAB — GLUCOSE, CAPILLARY: GLUCOSE-CAPILLARY: 93 mg/dL (ref 65–99)

## 2016-01-04 MED ORDER — VINCRISTINE SULFATE CHEMO INJECTION 1 MG/ML
Freq: Once | INTRAVENOUS | Status: AC
  Administered 2016-01-04: 10:00:00 via INTRAVENOUS
  Filled 2016-01-04: qty 10

## 2016-01-04 MED ORDER — OXYCODONE HCL ER 10 MG PO T12A
10.0000 mg | EXTENDED_RELEASE_TABLET | Freq: Two times a day (BID) | ORAL | Status: DC
  Administered 2016-01-04: 10 mg via ORAL

## 2016-01-04 MED ORDER — CLOPIDOGREL BISULFATE 75 MG PO TABS
75.0000 mg | ORAL_TABLET | Freq: Every day | ORAL | Status: DC
  Administered 2016-01-04: 75 mg via ORAL

## 2016-01-04 MED ORDER — SODIUM CHLORIDE 0.9 % IV SOLN
Freq: Once | INTRAVENOUS | Status: AC
  Administered 2016-01-04: 8 mg via INTRAVENOUS
  Filled 2016-01-04: qty 4

## 2016-01-04 NOTE — Progress Notes (Signed)
[name]   HEMATOLOGY/ONCOLOGY INPATIENT PROGRESS NOTE  Date of Service: 01/04/2016 Inpatient Attending: .Brunetta Genera, MD   SUBJECTIVE  Erik Vaughn has no new complaints today. He notes that his pain is completely controlled and he has not needed any breakthrough medications. He is okay with cutting down his OxyContin dose to try to wean this off if possible. Notes that he feels a little amped up due to the steroids and has been walking around quite a bit. No other acute new symptoms.  OBJECTIVE:  NAD  PHYSICAL EXAMINATION: . Vitals:   01/03/16 0612 01/03/16 1334 01/03/16 2057 01/04/16 0645  BP: (!) 141/74 122/67 (!) 141/71 (!) 143/71  Pulse: 66 90 74 68  Resp: '16 18 16 16  '$ Temp: 97.4 F (36.3 C) 97.4 F (36.3 C) 97.9 F (36.6 C) 97.9 F (36.6 C)  TempSrc: Oral Oral Oral Oral  SpO2: 95% 95% 93% 99%  Weight: 190 lb 11.2 oz (86.5 kg)   189 lb 6 oz (85.9 kg)  Height:       Filed Weights   01/01/16 0855 01/03/16 0612 01/04/16 0645  Weight: 190 lb (86.2 kg) 190 lb 11.2 oz (86.5 kg) 189 lb 6 oz (85.9 kg)   .Body mass index is 24.99 kg/m.   GENERAL:alert, in no acute distress and comfortable SKIN: skin color, texture, turgor are normal, no rashes or significant lesions EYES: normal, conjunctiva are pink and non-injected, sclera clear OROPHARYNX:no exudate, no erythema and lips, buccal mucosa, and tongue normal  NECK: supple, no JVD, thyroid normal size, non-tender, without nodularity LYMPH: no palpable lymphadenopathy in the cervical, axillary or inguinal LUNGS: clear to auscultation with normal respiratory effort, large left anterior chest wall mass appears to have shrunk significantly HEART: regular rate &rhythm, no murmurs and no lower extremity edema ABDOMEN: abdomen soft, non-tender, normoactive bowel sounds  Musculoskeletal: no cyanosis of digits and no clubbing  PSYCH: alert & oriented x 3 with fluent speech NEURO: no focal motor/sensory deficits  MEDICAL  HISTORY:  Past Medical History:  Diagnosis Date  . Hypertension   . PAD (peripheral artery disease) (HCC) left leg    SURGICAL HISTORY: Past Surgical History:  Procedure Laterality Date  . BRAIN SURGERY    . IR GENERIC HISTORICAL  12/31/2015   IR FLUORO GUIDE CV LINE RIGHT 12/31/2015 Arne Cleveland, MD [facility]  . IR GENERIC HISTORICAL  12/31/2015   IR US GUIDE VASC ACCESS RIGHT 12/31/2015 Arne Cleveland, MD [facility]  . left salivary Left    removed    SOCIAL HISTORY: Social History   Social History  . Marital status: Married    Spouse name: N/A  . Number of children: N/A  . Years of education: N/A   Occupational History  . Not on file.   Social History Main Topics  . Smoking status: Former Smoker    Packs/day: 2.00    Quit date: 12/22/2005  . Smokeless tobacco: Never Used  . Alcohol use Yes     Comment: occasionally  . Drug use: Unknown  . Sexual activity: Not on file   Other Topics Concern  . Not on file   Social History Narrative  . No narrative on file    FAMILY HISTORY: History reviewed. No pertinent family history.  ALLERGIES:  has No Known Allergies.  MEDICATIONS:  Scheduled Meds: . allopurinol  300 mg Oral Daily  . amLODipine  10 mg Oral QPM  . aspirin EC  81 mg Oral QHS  . clopidogrel  75 mg Oral QHS  . docusate sodium  100 mg Oral BID  . DOXOrubicin/vinCRIStine/etoposide CHEMO IV infusion for Inpatient CI   Intravenous Once  . enoxaparin (LOVENOX) injection  40 mg Subcutaneous Q24H  . lisinopril  20 mg Oral QPM  . oxyCODONE  20 mg Oral Q12H  . pantoprazole  40 mg Oral Daily  . polyethylene glycol  17 g Oral BID  . predniSONE  80 mg Oral QAC breakfast  . senna-docusate  2 tablet Oral QHS   Continuous Infusions: . sodium chloride 20 mL/hr at 01/01/16 1100   PRN Meds:.acetaminophen, alteplase, Cold Pack, heparin lock flush, heparin lock flush, Hot Pack, LORazepam, oxyCODONE-acetaminophen, sodium chloride flush, sodium chloride  flush  REVIEW OF SYSTEMS:    10 Point review of Systems was done is negative except as noted above.   LABORATORY DATA:  I have reviewed the data as listed  . CBC Latest Ref Rng & Units 01/04/2016 01/03/2016 01/02/2016  WBC 4.0 - 10.5 K/uL 10.6(H) 14.0(H) 16.6(H)  Hemoglobin 13.0 - 17.0 g/dL 11.6(L) 10.4(L) 11.0(L)  Hematocrit 39.0 - 52.0 % 35.5(L) 31.5(L) 34.2(L)  Platelets 150 - 400 K/uL 386 368 409(H)    . CMP Latest Ref Rng & Units 01/04/2016 01/03/2016 01/02/2016  Glucose 65 - 99 mg/dL 121(H) 137(H) 140(H)  BUN 6 - 20 mg/dL 21(H) 24(H) 22(H)  Creatinine 0.61 - 1.24 mg/dL 0.73 0.72 0.80  Sodium 135 - 145 mmol/L 137 136 136  Potassium 3.5 - 5.1 mmol/L 4.0 4.2 4.9  Chloride 101 - 111 mmol/L 100(L) 101 101  CO2 22 - 32 mmol/L '29 27 29  '$ Calcium 8.9 - 10.3 mg/dL 9.1 8.7(L) 9.3  Total Protein 6.5 - 8.1 g/dL 6.1(L) 5.5(L) 6.0(L)  Total Bilirubin 0.3 - 1.2 mg/dL 0.7 0.5 0.3  Alkaline Phos 38 - 126 U/L 80 75 72  AST 15 - 41 U/L '24 24 23  '$ ALT 17 - 63 U/L 42 39 41     RADIOGRAPHIC STUDIES: I have personally reviewed the radiological images as listed and agreed with the findings in the report. Dg Chest 2 View  Result Date: 12/18/2015 CLINICAL DATA:  Metastatic cancer.  Left chest wall mass. EXAM: CHEST  2 VIEW COMPARISON:  None. FINDINGS: Heart size is normal. There is aortic atherosclerosis. No abnormality is seen in the right chest. On the frontal view, there is abnormal density projected over the left lower chest. There is lytic destruction of the left ninth rib posteriorly. There is a posterior chest wall mass measuring at least 5.7 cm in diameter seen on the lateral. Additionally, there is in the anterior retrosternal density on the lateral that could also be a chest wall mass anteriorly. No effusions. No spinal fracture seen. IMPRESSION: Lytic destructive lesion of the ninth rib on the left posteriorly. Abnormal chest wall density posteriorly with a mass measuring at least 5.7 cm. Possible  anterior chest wall mass as well. Electronically Signed   By: Nelson Chimes M.D.   On: 12/18/2015 13:59   Nm Pet Image Initial (pi) Skull Base To Thigh  Result Date: 12/25/2015 CLINICAL DATA:  Initial treatment strategy for diffuse large B-cell lymphoma with osseous metastases. EXAM: NUCLEAR MEDICINE PET SKULL BASE TO THIGH TECHNIQUE: 9.29 mCi F-18 FDG was injected intravenously. Full-ring PET imaging was performed from the skull base to thigh after the radiotracer. CT data was obtained and used for attenuation correction and anatomic localization. FASTING BLOOD GLUCOSE:  Value: 123 mg/dl COMPARISON:  None. FINDINGS: NECK No hypermetabolic  lymph nodes in the neck. CHEST 13 x 11 mm posterior right upper lobe/ right perihilar nodule (series 8/ image 25), max SUV 18.4, suspicious for pulmonary metastasis. Trace left pleural effusion. 2.3 x 1.9 cm left paratracheal node (series 4/ image 57), max SUV 36.8. Additional 8 mm short axis left infrahilar node (series 4/ image 80), max SUV 13.0. Additional small mediastinal lymph nodes, although without appreciable hypermetabolism. Three vessel coronary atherosclerosis. Mild atherosclerotic calcifications of the abdominal aorta. ABDOMEN/PELVIS No abnormal hypermetabolic activity within the liver, pancreas, adrenal glands, or spleen. 7.1 x 10.6 cm aggregate nodal mass in the right mid abdomen (series 4/ image 147), max SUV 41.5. 6.4 x 5.3 cm left external iliac nodal mass (series 4/image 137), max SUV 38.7. Additional foci of hypermetabolism along the right anterior abdominal wall and right abdominal mesentery. Prostatomegaly, with enlargement of the central gland which indents the base of the bladder. Mildly thick-walled bladder. Atherosclerotic calcifications the abdominal aorta and branch vessels. SKELETON 4.9 x 6.8 cm left anterior chest wall metastasis involving the right anterior 2nd and 3rd rib (series 4/ image 80), max SUV 37.2. 4.8 x 5.9 cm left posterior chest wall  metastasis (series 4/ image 91), with associated pathologic fracture of the left posterior 9th rib, max SUV 48.6. Left sacral metastasis (series 4/ image 163), max SUV 31.1. Additional hypermetabolism involving the left acetabulum and iliac bone. Right proximal humeral metastasis, max SUV 39.9. IMPRESSION: Widespread lymphoma in the chest, abdomen, and pelvis, as above. 13 mm posterior right upper lobe pulmonary nodule, suspicious for pulmonary metastasis. Upper thoracic and left perihilar nodal lymphoma. Large nodal masses in the right mid abdomen and left pelvis. Osseous metastases involving the left anterior and left posterior chest wall, with pathologic fracture of the left posterior 9th rib. Additional osseous metastases involving the left sacral and right proximal humerus. Additional ancillary findings as above. Electronically Signed   By: Julian Hy M.D.   On: 12/25/2015 10:53  US Biopsy  Result Date: 12/23/2015 CLINICAL DATA:  Large mass of the left chest wall as well as destructive lesion of the left ninth rib and sacral mass. The patient presents for biopsy of the chest wall mass. EXAM: ULTRASOUND GUIDED CORE BIOPSY OF LEFT CHEST WALL MASS MEDICATIONS: 1.0 mg IV Versed; 50 mcg IV Fentanyl Total Moderate Sedation Time: 9 minutes. The patient's level of consciousness and physiologic status were continuously monitored during the procedure by Radiology nursing. PROCEDURE: The procedure, risks, benefits, and alternatives were explained to the patient. Questions regarding the procedure were encouraged and answered. The patient understands and consents to the procedure. A time-out was performed prior to initiating the procedure. The left chest wall was prepped with chlorhexidine in a sterile fashion, and a sterile drape was applied covering the operative field. A sterile gown and sterile gloves were used for the procedure. Local anesthesia was provided with 1% Lidocaine. A left chest wall mass was  localized by ultrasound. Seven separate 16 gauge core biopsy samples were obtained and submitted in both saline and formalin. Post biopsy imaging was performed with ultrasound. COMPLICATIONS: None. FINDINGS: Large hypoechoic, solid oval shaped left chest wall mass is identified in the left pectoral region. Solid tissue was obtained. IMPRESSION: Ultrasound-guided core biopsy performed of a large left chest wall mass. Electronically Signed   By: Aletta Edouard M.D.   On: 12/23/2015 16:50  Ir Fluoro Guide Cv Line Right  Result Date: 12/31/2015 CLINICAL DATA:  Diffuse large B-cell lymphoma, needs durable venous access for  chemotherapy EXAM: TUNNELED PORT CATHETER PLACEMENT WITH ULTRASOUND AND FLUOROSCOPIC GUIDANCE FLUOROSCOPY TIME:  6 seconds, 2 mGy ANESTHESIA/SEDATION: Intravenous Fentanyl and Versed were administered as conscious sedation during continuous monitoring of the patient's level of consciousness and physiological / cardiorespiratory status by the radiology RN, with a total moderate sedation time of 15 minutes. TECHNIQUE: The procedure, risks, benefits, and alternatives were explained to the patient. Questions regarding the procedure were encouraged and answered. The patient understands and consents to the procedure. As antibiotic prophylaxis, cefazolin 2 g was ordered pre-procedure and administered intravenously within one hour of incision. Patency of the right IJ vein was confirmed with ultrasound with image documentation. An appropriate skin site was determined. Skin site was marked. Region was prepped using maximum barrier technique including cap and mask, sterile gown, sterile gloves, large sterile sheet, and Chlorhexidine as cutaneous antisepsis. The region was infiltrated locally with 1% lidocaine. Under real-time ultrasound guidance, the right IJ vein was accessed with a 21 gauge micropuncture needle; the needle tip within the vein was confirmed with ultrasound image documentation. Needle was  exchanged over a 018 guidewire for transitional dilator which allowed passage of the [facility] wire into the IVC. Over this, the transitional dilator was exchanged for a 5 [place] MPA catheter. A small incision was made on the right anterior chest wall and a subcutaneous pocket fashioned. The power-injectable port was positioned and its catheter tunneled to the right IJ dermatotomy site. The MPA catheter was exchanged over an Amplatz wire for a peel-away sheath, through which the port catheter, which had been trimmed to the appropriate length, was advanced and positioned under fluoroscopy with its tip at the cavoatrial junction. Spot chest radiograph confirms good catheter position and no pneumothorax. The pocket was closed with deep interrupted and subcuticular continuous 3-0 Monocryl sutures. The port was flushed per protocol. The incisions were covered with Dermabond then covered with a sterile dressing. COMPLICATIONS: COMPLICATIONS None immediate IMPRESSION: Technically successful right IJ power-injectable port catheter placement. Ready for routine use. Electronically Signed   By: Lucrezia Europe M.D.   On: 12/31/2015 10:20   Ir US Guide Vasc Access Right  Result Date: 12/31/2015 CLINICAL DATA:  Diffuse large B-cell lymphoma, needs durable venous access for chemotherapy EXAM: TUNNELED PORT CATHETER PLACEMENT WITH ULTRASOUND AND FLUOROSCOPIC GUIDANCE FLUOROSCOPY TIME:  6 seconds, 2 mGy ANESTHESIA/SEDATION: Intravenous Fentanyl and Versed were administered as conscious sedation during continuous monitoring of the patient's level of consciousness and physiological / cardiorespiratory status by the radiology RN, with a total moderate sedation time of 15 minutes. TECHNIQUE: The procedure, risks, benefits, and alternatives were explained to the patient. Questions regarding the procedure were encouraged and answered. The patient understands and consents to the procedure. As antibiotic prophylaxis, cefazolin 2 g was ordered  pre-procedure and administered intravenously within one hour of incision. Patency of the right IJ vein was confirmed with ultrasound with image documentation. An appropriate skin site was determined. Skin site was marked. Region was prepped using maximum barrier technique including cap and mask, sterile gown, sterile gloves, large sterile sheet, and Chlorhexidine as cutaneous antisepsis. The region was infiltrated locally with 1% lidocaine. Under real-time ultrasound guidance, the right IJ vein was accessed with a 21 gauge micropuncture needle; the needle tip within the vein was confirmed with ultrasound image documentation. Needle was exchanged over a 018 guidewire for transitional dilator which allowed passage of the [facility] wire into the IVC. Over this, the transitional dilator was exchanged for a 5 [place] MPA catheter. A small  incision was made on the right anterior chest wall and a subcutaneous pocket fashioned. The power-injectable port was positioned and its catheter tunneled to the right IJ dermatotomy site. The MPA catheter was exchanged over an Amplatz wire for a peel-away sheath, through which the port catheter, which had been trimmed to the appropriate length, was advanced and positioned under fluoroscopy with its tip at the cavoatrial junction. Spot chest radiograph confirms good catheter position and no pneumothorax. The pocket was closed with deep interrupted and subcuticular continuous 3-0 Monocryl sutures. The port was flushed per protocol. The incisions were covered with Dermabond then covered with a sterile dressing. COMPLICATIONS: COMPLICATIONS None immediate IMPRESSION: Technically successful right IJ power-injectable port catheter placement. Ready for routine use. Electronically Signed   By: Lucrezia Europe M.D.   On: 12/31/2015 10:20   Ct Biopsy  Result Date: 12/31/2015 CLINICAL DATA:  Diffuse large B-cell lymphoma, osseous metastases EXAM: CT GUIDED DEEP ILIAC BONE ASPIRATION AND CORE BIOPSY  TECHNIQUE: The procedure, risks (including but not limited to bleeding, infection, organ damage ), benefits, and alternatives were explained to the patient. Questions regarding the procedure were encouraged and answered. The patient understands and consents to the procedure. Patient was placed supine on the CT gantry and limited axial scans through the pelvis were obtained. Appropriate skin entry site was identified. Skin site was marked, prepped with Betadine, draped in usual sterile fashion, and infiltrated locally with 1% lidocaine. Intravenous Fentanyl and Versed were administered as conscious sedation during continuous monitoring of the patient's level of consciousness and physiological / cardiorespiratory status by the radiology RN, with a total moderate sedation time of 9 minutes. Under CT fluoroscopic guidance a trocar bone needle was advanced into the right iliac bone just lateral to the sacroiliac joint using the OncoTrol device. Once needle tip position was confirmed, coaxial core and aspiration samples were obtained. The final sample was obtained using the guiding needle itself, which was then removed. Post procedure scans show no hematoma or fracture. Patient tolerated procedure well. COMPLICATIONS: COMPLICATIONS none IMPRESSION: 1. Technically successful CT guided right iliac bone core and aspiration biopsy. Electronically Signed   By: Lucrezia Europe M.D.   On: 12/31/2015 10:20   Erik Outside Films Spine  Result Date: 12/18/2015 CLINICAL DATA:  This exam is stored here for comparison purposes only and was performed at an outside facility.   Please contact the originating institution for any associated interpretation or report.   Dg Fluoro Guide Lumbar Puncture  Result Date: 01/01/2016 CLINICAL DATA:  65 year old male with Burkitt's lymphoma. Subsequent encounter. EXAM: DIAGNOSTIC LUMBAR PUNCTURE UNDER FLUOROSCOPIC GUIDANCE FLUOROSCOPY TIME:  If the device does not provide the exposure index:  Fluoroscopy Time (in minutes and seconds):  20 second Number of Acquired Images:  2 PROCEDURE: Case discussed with staff. No clinical suspicion for intracranial abnormality. Informed consent was obtained from the patient prior to the procedure, including potential complications of headache, bleeding allergy, and pain. Time-out performed. With the patient prone, the lower back was prepped with Betadine. 1% Lidocaine was used for local anesthesia. Lumbar puncture was performed at the L2-3 level using a 22 gauge needle with return of initially minimally blood tinged CSF which cleared with an opening pressure of 12 cm water. Ten ml of CSF were obtained for laboratory studies. The patient tolerated the procedure well and there were no apparent complications. IMPRESSION: L2-3 lumbar puncture with collection of 10 cc of cerebral spinal fluid sent for labs as per request. Electronically Signed  By: Genia Del M.D.   On: 01/01/2016 14:01    ASSESSMENT & PLAN:  #1 Stage IVB high risk Burkitt's Lymphoma. Discussed with Dr Tresa Moore - LN biopsy confirmed presence of t(8;14) translocation consistent with Burkitts Lymphoma. [place] breakapart event on Fish panel but negative for BCL-2 and BCL 6.  This appears to be involving the sacrum causing sacral and root involvement with possible neurogenic bladder. PET CT scan as noted above shows extensive involvement with lymphoma.  CSF cytology neg for involvement with Burkitts lymphoma and patient has no neurological symptoms suggesting Burkitts involvement.  BM Bx - no evidence of involvement with Burkitts lymphoma  Echo showed normal ejection fraction 65-70%.  Plan -labs stable. NO prohibitive toxicities -uric acid stable. -continue EPOCH-R (C1D4  Today) as per plan should be completed on 01/05/2016. -Will get Neulasta on D6 as outpatient -Daily CBC, CMP, uric acid -will likely start IT MTX prophylaxis with C2 or C3 -Continue on on allopurinol for tumor lysis  syndrome prophylaxis atleast for till cycle 2.  #2 left sacral ala and S1 fracture. L5-S1 and S1-S2 left-sided nerve root compression. #3 neoplasm related pain Plan -Since patient's pain is much better controlled and he is not needing any breakthrough pain medications we shall reduce his OxyContin to 10 mg by mouth twice a day and  Percocet for breakthrough pain.  #4 Hypertension, dyslipidemia, peripheral arterial disease -Reconciled appropriate medications. -Plavix was on hold for port placement and lumbar puncture. We will restart tonight -will need to plan to hold with future IT Mtx doses as well. -On aspirin  -Holding cholesterol medicine and other  nonessential medications during chemotherapy .  #5 ex-smoker with 80-pack-year history of smoking.    I spent 25 minutes counseling the patient face to face. The total time spent in the appointment was 25 minutes and more than 50% was on counseling and direct patient cares.    Sullivan Lone MD [place] AAHIVMS [facility] Hematology/Oncology Physician [facility]  (Office):       [phone] (Work cell):  [phone] (Fax):           [phone]

## 2016-01-04 NOTE — Progress Notes (Signed)
Chemotherapy dosage and calculations reviewed and verified with Nancy Marus, RN.

## 2016-01-05 ENCOUNTER — Telehealth: Payer: Self-pay | Admitting: Hematology

## 2016-01-05 ENCOUNTER — Other Ambulatory Visit: Payer: Self-pay | Admitting: *Deleted

## 2016-01-05 LAB — COMPREHENSIVE METABOLIC PANEL
ALBUMIN: 3.1 g/dL — AB (ref 3.5–5.0)
ALT: 44 U/L (ref 17–63)
ANION GAP: 9 (ref 5–15)
AST: 25 U/L (ref 15–41)
Alkaline Phosphatase: 70 U/L (ref 38–126)
BILIRUBIN TOTAL: 0.6 mg/dL (ref 0.3–1.2)
BUN: 20 mg/dL (ref 6–20)
CHLORIDE: 99 mmol/L — AB (ref 101–111)
CO2: 27 mmol/L (ref 22–32)
Calcium: 8.9 mg/dL (ref 8.9–10.3)
Creatinine, Ser: 0.55 mg/dL — ABNORMAL LOW (ref 0.61–1.24)
GFR calc Af Amer: >60 mL/min (ref >60)
GLUCOSE: 123 mg/dL — AB (ref 65–99)
POTASSIUM: 3.9 mmol/L (ref 3.5–5.1)
Sodium: 135 mmol/L (ref 135–145)
TOTAL PROTEIN: 5.8 g/dL — AB (ref 6.5–8.1)

## 2016-01-05 LAB — CSF CULTURE W GRAM STAIN: Special Requests: NORMAL

## 2016-01-05 LAB — CBC
HEMATOCRIT: 35.8 % — AB (ref 39.0–52.0)
Hemoglobin: 11.5 g/dL — ABNORMAL LOW (ref 13.0–17.0)
MCH: 26.4 pg (ref 26.0–34.0)
MCHC: 32.1 g/dL (ref 30.0–36.0)
MCV: 82.3 fL (ref 78.0–100.0)
PLATELETS: 339 10*3/uL (ref 150–400)
RBC: 4.35 MIL/uL (ref 4.22–5.81)
RDW: 13.7 % (ref 11.5–15.5)
WBC: 7.1 10*3/uL (ref 4.0–10.5)

## 2016-01-05 LAB — GLUCOSE, CAPILLARY: Glucose-Capillary: 109 mg/dL — ABNORMAL HIGH (ref 65–99)

## 2016-01-05 LAB — CSF CULTURE
CULTURE: NO GROWTH
GRAM STAIN: NONE SEEN

## 2016-01-05 LAB — URIC ACID: URIC ACID, SERUM: 4.6 mg/dL (ref 4.4–7.6)

## 2016-01-05 MED ORDER — DIPHENHYDRAMINE HCL 50 MG/ML IJ SOLN: 25.0000 mg | Freq: Once | INTRAMUSCULAR | Status: DC | PRN

## 2016-01-05 MED ORDER — SODIUM CHLORIDE 0.9 % IV SOLN
375.0000 mg/m2 | Freq: Once | INTRAVENOUS | Status: AC
  Administered 2016-01-05: 800 mg via INTRAVENOUS
  Filled 2016-01-05: qty 30

## 2016-01-05 MED ORDER — SODIUM CHLORIDE 0.9 % IV SOLN
Freq: Once | INTRAVENOUS | Status: AC
  Administered 2016-01-05: 13:00:00 via INTRAVENOUS

## 2016-01-05 MED ORDER — PROCHLORPERAZINE 25 MG RE SUPP
25.0000 mg | Freq: Two times a day (BID) | RECTAL | 3 refills | Status: DC | PRN
Start: 2016-01-05 — End: 2016-01-26

## 2016-01-05 MED ORDER — DEXAMETHASONE 4 MG PO TABS: 8.0000 mg | ORAL_TABLET | Freq: Two times a day (BID) | ORAL | 1 refills | Status: DC

## 2016-01-05 MED ORDER — METHYLPREDNISOLONE SODIUM SUCC 125 MG IJ SOLR: 125.0000 mg | Freq: Once | INTRAMUSCULAR | Status: DC | PRN

## 2016-01-05 MED ORDER — ONDANSETRON HCL 8 MG PO TABS: 8.0000 mg | ORAL_TABLET | Freq: Two times a day (BID) | ORAL | 1 refills | Status: DC

## 2016-01-05 MED ORDER — DIPHENHYDRAMINE HCL 50 MG/ML IJ SOLN: 50.0000 mg | Freq: Once | INTRAMUSCULAR | Status: DC | PRN

## 2016-01-05 MED ORDER — OXYCODONE HCL ER 10 MG PO T12A: 10.0000 mg | EXTENDED_RELEASE_TABLET | Freq: Two times a day (BID) | ORAL | 0 refills | Status: DC

## 2016-01-05 MED ORDER — HEPARIN SOD (PORK) LOCK FLUSH 100 UNIT/ML IV SOLN: 250.0000 [IU] | Freq: Once | INTRAVENOUS | Status: DC | PRN

## 2016-01-05 MED ORDER — PROCHLORPERAZINE MALEATE 10 MG PO TABS: 10.0000 mg | ORAL_TABLET | Freq: Four times a day (QID) | ORAL | 1 refills | Status: DC | PRN

## 2016-01-05 MED ORDER — EPINEPHRINE HCL 1 MG/ML IJ SOLN: 0.5000 mg | Freq: Once | INTRAMUSCULAR | Status: DC | PRN

## 2016-01-05 MED ORDER — EPINEPHRINE HCL 0.1 MG/ML IJ SOSY: 0.2500 mg | PREFILLED_SYRINGE | Freq: Once | INTRAMUSCULAR | Status: DC | PRN

## 2016-01-05 MED ORDER — SODIUM CHLORIDE 0.9 % IV SOLN: Freq: Once | INTRAVENOUS | Status: DC | PRN

## 2016-01-05 MED ORDER — SODIUM CHLORIDE 0.9 % IV SOLN
750.0000 mg/m2 | Freq: Once | INTRAVENOUS | Status: AC
  Administered 2016-01-05: 1560 mg via INTRAVENOUS
  Filled 2016-01-05: qty 78

## 2016-01-05 MED ORDER — HEPARIN SOD (PORK) LOCK FLUSH 100 UNIT/ML IV SOLN
500.0000 [IU] | Freq: Once | INTRAVENOUS | Status: DC
  Filled 2016-01-05: qty 5

## 2016-01-05 MED ORDER — SODIUM CHLORIDE 0.9% FLUSH: 3.0000 mL | INTRAVENOUS | Status: DC | PRN

## 2016-01-05 MED ORDER — DIPHENHYDRAMINE HCL 50 MG PO CAPS
50.0000 mg | ORAL_CAPSULE | Freq: Once | ORAL | Status: AC
  Administered 2016-01-05: 50 mg via ORAL
  Filled 2016-01-05: qty 1

## 2016-01-05 MED ORDER — SODIUM CHLORIDE 0.9% FLUSH: 10.0000 mL | INTRAVENOUS | Status: DC | PRN

## 2016-01-05 MED ORDER — SODIUM CHLORIDE 0.9 % IV SOLN
Freq: Once | INTRAVENOUS | Status: AC
  Administered 2016-01-05: 16 mg via INTRAVENOUS
  Filled 2016-01-05: qty 8

## 2016-01-05 MED ORDER — ALTEPLASE 2 MG IJ SOLR: 2.0000 mg | Freq: Once | INTRAMUSCULAR | Status: DC | PRN

## 2016-01-05 MED ORDER — HEPARIN SOD (PORK) LOCK FLUSH 100 UNIT/ML IV SOLN: 500.0000 [IU] | Freq: Once | INTRAVENOUS | Status: DC | PRN

## 2016-01-05 MED ORDER — FAMOTIDINE IN NACL 20-0.9 MG/50ML-% IV SOLN: 20.0000 mg | Freq: Once | INTRAVENOUS | Status: DC | PRN

## 2016-01-05 MED ORDER — ACETAMINOPHEN 325 MG PO TABS
650.0000 mg | ORAL_TABLET | Freq: Once | ORAL | Status: AC
  Administered 2016-01-05: 650 mg via ORAL
  Filled 2016-01-05: qty 2

## 2016-01-05 MED ORDER — ALBUTEROL SULFATE (2.5 MG/3ML) 0.083% IN NEBU: 2.5000 mg | INHALATION_SOLUTION | Freq: Once | RESPIRATORY_TRACT | Status: DC | PRN

## 2016-01-05 MED ORDER — LORAZEPAM 0.5 MG PO TABS: 0.5000 mg | ORAL_TABLET | Freq: Four times a day (QID) | ORAL | 0 refills | Status: DC | PRN

## 2016-01-05 NOTE — Progress Notes (Signed)
Dr Grier Mitts nurse called to change patient's appointment, patient will see MD on 8/22 0800.

## 2016-01-05 NOTE — Discharge Summary (Addendum)
[place]  [phone] [phone]    Physician Discharge Summary     Patient ID: Erik Vaughn MRN: [id number] DOB/AGE: 1950/11/19 65 y.o.  Admit date: 01/01/2016 Discharge date: 01/05/2016  Primary Care Physician:  Ernestene Kiel, MD  Discharge Diagnoses:    Present on Admission: . 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 What changed:  when to take this   amLODipine 10 MG tablet Commonly known as:  NORVASC Take 10 mg by mouth every evening.   aspirin EC 81 MG tablet Take 81 mg by mouth at bedtime.   CENTRUM SILVER PO Take 1 tablet by mouth daily with breakfast.   clopidogrel 75 MG tablet Commonly known as:  PLAVIX Take 75 mg by mouth at bedtime.   dexamethasone 4 MG tablet Commonly known as:  DECADRON Take 2 tablets (8 mg total) by mouth 2 (two) times daily with a meal. Take two times a day starting the day after chemotherapy for 3 days. What changed:  how much to take  when to take this  additional instructions   lisinopril 20 MG tablet Commonly known as:  PRINIVIL,ZESTRIL Take 20 mg by mouth every evening.   LORazepam 0.5 MG tablet Commonly known as:  ATIVAN Take 1 tablet (0.5 mg total) by mouth every 8 (eight) hours as needed for anxiety. Could take 2 tabs (57m) 30 mins prior to PET/CT scan What changed:  Another medication with the same name was added. Make sure you understand how and when to take each.   LORazepam 0.5 MG tablet Commonly known as:  ATIVAN Take 1 tablet (0.5 mg total) by mouth every 6 (six) hours as needed (Nausea  or vomiting). What changed:  You were already taking a medication with the same name, and this prescription was added. Make sure you understand how and when to take each.   ondansetron 8 MG tablet Commonly known as:  ZOFRAN Take 1 tablet (8 mg total) by mouth 2 (two) times daily. Take two times a day starting the day after chemo for 3 days. Then take two times a day as needed for nausea or vomiting.   oxyCODONE 10 mg 12 hr tablet Commonly known as:  OXYCONTIN Take 1 tablet (10 mg total) by mouth every 12 (twelve) hours. What changed:  how much to take   oxyCODONE-acetaminophen 5-325 MG tablet Commonly known as:  PERCOCET/ROXICET Take 1-2 tablets by mouth every 4 (four) hours as needed for severe pain.   polyethylene glycol packet Commonly known as:  MIRALAX Take 17 g by mouth daily. What changed:  when to take this   prochlorperazine 10 MG tablet Commonly known as:  COMPAZINE Take 1 tablet (10 mg total) by mouth every 6 (six) hours as needed (Nausea or vomiting).   prochlorperazine 25 MG suppository Commonly known as:  COMPAZINE Place 1 suppository (25 mg total) rectally every 12 (twelve) hours as needed for nausea.   senna-docusate 8.6-50 MG tablet Commonly known as:  SENNA S Take 2 tablets by mouth at bedtime.   vitamin C 500  MG tablet Commonly known as:  ASCORBIC ACID Take 500 mg by mouth 2 (two) times daily.   Vitamin D3 2000 units Tabs Take 2,000 Units by mouth daily with lunch.      Disposition and Follow-up:   Significant Diagnostic Studies:  Dg Chest 2 View  Result Date: 12/18/2015 CLINICAL DATA:  Metastatic cancer.  Left chest wall mass. EXAM: CHEST  2 VIEW COMPARISON:  None. FINDINGS: Heart size is normal. There is aortic atherosclerosis. No abnormality is seen in the right chest. On the frontal view, there is abnormal density projected over the left lower chest. There is lytic destruction of the left ninth rib posteriorly. There is a posterior chest wall mass  measuring at least 5.7 cm in diameter seen on the lateral. Additionally, there is in the anterior retrosternal density on the lateral that could also be a chest wall mass anteriorly. No effusions. No spinal fracture seen. IMPRESSION: Lytic destructive lesion of the ninth rib on the left posteriorly. Abnormal chest wall density posteriorly with a mass measuring at least 5.7 cm. Possible anterior chest wall mass as well. Electronically Signed   By: Nelson Chimes M.D.   On: 12/18/2015 13:59   Nm Pet Image Initial (pi) Skull Base To Thigh  Result Date: 12/25/2015 CLINICAL DATA:  Initial treatment strategy for diffuse large B-cell lymphoma with osseous metastases. EXAM: NUCLEAR MEDICINE PET SKULL BASE TO THIGH TECHNIQUE: 9.29 mCi F-18 FDG was injected intravenously. Full-ring PET imaging was performed from the skull base to thigh after the radiotracer. CT data was obtained and used for attenuation correction and anatomic localization. FASTING BLOOD GLUCOSE:  Value: 123 mg/dl COMPARISON:  None. FINDINGS: NECK No hypermetabolic lymph nodes in the neck. CHEST 13 x 11 mm posterior right upper lobe/ right perihilar nodule (series 8/ image 25), max SUV 18.4, suspicious for pulmonary metastasis. Trace left pleural effusion. 2.3 x 1.9 cm left paratracheal node (series 4/ image 57), max SUV 36.8. Additional 8 mm short axis left infrahilar node (series 4/ image 80), max SUV 13.0. Additional small mediastinal lymph nodes, although without appreciable hypermetabolism. Three vessel coronary atherosclerosis. Mild atherosclerotic calcifications of the abdominal aorta. ABDOMEN/PELVIS No abnormal hypermetabolic activity within the liver, pancreas, adrenal glands, or spleen. 7.1 x 10.6 cm aggregate nodal mass in the right mid abdomen (series 4/ image 147), max SUV 41.5. 6.4 x 5.3 cm left external iliac nodal mass (series 4/image 137), max SUV 38.7. Additional foci of hypermetabolism along the right anterior abdominal wall and right  abdominal mesentery. Prostatomegaly, with enlargement of the central gland which indents the base of the bladder. Mildly thick-walled bladder. Atherosclerotic calcifications the abdominal aorta and branch vessels. SKELETON 4.9 x 6.8 cm left anterior chest wall metastasis involving the right anterior 2nd and 3rd rib (series 4/ image 80), max SUV 37.2. 4.8 x 5.9 cm left posterior chest wall metastasis (series 4/ image 91), with associated pathologic fracture of the left posterior 9th rib, max SUV 48.6. Left sacral metastasis (series 4/ image 163), max SUV 31.1. Additional hypermetabolism involving the left acetabulum and iliac bone. Right proximal humeral metastasis, max SUV 39.9. IMPRESSION: Widespread lymphoma in the chest, abdomen, and pelvis, as above. 13 mm posterior right upper lobe pulmonary nodule, suspicious for pulmonary metastasis. Upper thoracic and left perihilar nodal lymphoma. Large nodal masses in the right mid abdomen and left pelvis. Osseous metastases involving the left anterior and left posterior chest wall, with pathologic fracture of the left posterior 9th rib. Additional osseous metastases involving the  left sacral and right proximal humerus. Additional ancillary findings as above. Electronically Signed   By: Julian Hy M.D.   On: 12/25/2015 10:53  US Biopsy  Result Date: 12/23/2015 CLINICAL DATA:  Large mass of the left chest wall as well as destructive lesion of the left ninth rib and sacral mass. The patient presents for biopsy of the chest wall mass. EXAM: ULTRASOUND GUIDED CORE BIOPSY OF LEFT CHEST WALL MASS MEDICATIONS: 1.0 mg IV Versed; 50 mcg IV Fentanyl Total Moderate Sedation Time: 9 minutes. The patient's level of consciousness and physiologic status were continuously monitored during the procedure by Radiology nursing. PROCEDURE: The procedure, risks, benefits, and alternatives were explained to the patient. Questions regarding the procedure were encouraged and answered.  The patient understands and consents to the procedure. A time-out was performed prior to initiating the procedure. The left chest wall was prepped with chlorhexidine in a sterile fashion, and a sterile drape was applied covering the operative field. A sterile gown and sterile gloves were used for the procedure. Local anesthesia was provided with 1% Lidocaine. A left chest wall mass was localized by ultrasound. Seven separate 16 gauge core biopsy samples were obtained and submitted in both saline and formalin. Post biopsy imaging was performed with ultrasound. COMPLICATIONS: None. FINDINGS: Large hypoechoic, solid oval shaped left chest wall mass is identified in the left pectoral region. Solid tissue was obtained. IMPRESSION: Ultrasound-guided core biopsy performed of a large left chest wall mass. Electronically Signed   By: Aletta Edouard M.D.   On: 12/23/2015 16:50  Ir Fluoro Guide Cv Line Right  Result Date: 12/31/2015 CLINICAL DATA:  Diffuse large B-cell lymphoma, needs durable venous access for chemotherapy EXAM: TUNNELED PORT CATHETER PLACEMENT WITH ULTRASOUND AND FLUOROSCOPIC GUIDANCE FLUOROSCOPY TIME:  6 seconds, 2 mGy ANESTHESIA/SEDATION: Intravenous Fentanyl and Versed were administered as conscious sedation during continuous monitoring of the patient's level of consciousness and physiological / cardiorespiratory status by the radiology RN, with a total moderate sedation time of 15 minutes. TECHNIQUE: The procedure, risks, benefits, and alternatives were explained to the patient. Questions regarding the procedure were encouraged and answered. The patient understands and consents to the procedure. As antibiotic prophylaxis, cefazolin 2 g was ordered pre-procedure and administered intravenously within one hour of incision. Patency of the right IJ vein was confirmed with ultrasound with image documentation. An appropriate skin site was determined. Skin site was marked. Region was prepped using maximum  barrier technique including cap and mask, sterile gown, sterile gloves, large sterile sheet, and Chlorhexidine as cutaneous antisepsis. The region was infiltrated locally with 1% lidocaine. Under real-time ultrasound guidance, the right IJ vein was accessed with a 21 gauge micropuncture needle; the needle tip within the vein was confirmed with ultrasound image documentation. Needle was exchanged over a 018 guidewire for transitional dilator which allowed passage of the [facility] wire into the IVC. Over this, the transitional dilator was exchanged for a 5 [place] MPA catheter. A small incision was made on the right anterior chest wall and a subcutaneous pocket fashioned. The power-injectable port was positioned and its catheter tunneled to the right IJ dermatotomy site. The MPA catheter was exchanged over an Amplatz wire for a peel-away sheath, through which the port catheter, which had been trimmed to the appropriate length, was advanced and positioned under fluoroscopy with its tip at the cavoatrial junction. Spot chest radiograph confirms good catheter position and no pneumothorax. The pocket was closed with deep interrupted and subcuticular continuous 3-0 Monocryl sutures. The port  was flushed per protocol. The incisions were covered with Dermabond then covered with a sterile dressing. COMPLICATIONS: COMPLICATIONS None immediate IMPRESSION: Technically successful right IJ power-injectable port catheter placement. Ready for routine use. Electronically Signed   By: Lucrezia Europe M.D.   On: 12/31/2015 10:20   Ir US Guide Vasc Access Right  Result Date: 12/31/2015 CLINICAL DATA:  Diffuse large B-cell lymphoma, needs durable venous access for chemotherapy EXAM: TUNNELED PORT CATHETER PLACEMENT WITH ULTRASOUND AND FLUOROSCOPIC GUIDANCE FLUOROSCOPY TIME:  6 seconds, 2 mGy ANESTHESIA/SEDATION: Intravenous Fentanyl and Versed were administered as conscious sedation during continuous monitoring of the patient's level of  consciousness and physiological / cardiorespiratory status by the radiology RN, with a total moderate sedation time of 15 minutes. TECHNIQUE: The procedure, risks, benefits, and alternatives were explained to the patient. Questions regarding the procedure were encouraged and answered. The patient understands and consents to the procedure. As antibiotic prophylaxis, cefazolin 2 g was ordered pre-procedure and administered intravenously within one hour of incision. Patency of the right IJ vein was confirmed with ultrasound with image documentation. An appropriate skin site was determined. Skin site was marked. Region was prepped using maximum barrier technique including cap and mask, sterile gown, sterile gloves, large sterile sheet, and Chlorhexidine as cutaneous antisepsis. The region was infiltrated locally with 1% lidocaine. Under real-time ultrasound guidance, the right IJ vein was accessed with a 21 gauge micropuncture needle; the needle tip within the vein was confirmed with ultrasound image documentation. Needle was exchanged over a 018 guidewire for transitional dilator which allowed passage of the [facility] wire into the IVC. Over this, the transitional dilator was exchanged for a 5 [place] MPA catheter. A small incision was made on the right anterior chest wall and a subcutaneous pocket fashioned. The power-injectable port was positioned and its catheter tunneled to the right IJ dermatotomy site. The MPA catheter was exchanged over an Amplatz wire for a peel-away sheath, through which the port catheter, which had been trimmed to the appropriate length, was advanced and positioned under fluoroscopy with its tip at the cavoatrial junction. Spot chest radiograph confirms good catheter position and no pneumothorax. The pocket was closed with deep interrupted and subcuticular continuous 3-0 Monocryl sutures. The port was flushed per protocol. The incisions were covered with Dermabond then covered with a sterile  dressing. COMPLICATIONS: COMPLICATIONS None immediate IMPRESSION: Technically successful right IJ power-injectable port catheter placement. Ready for routine use. Electronically Signed   By: Lucrezia Europe M.D.   On: 12/31/2015 10:20   Ct Biopsy  Result Date: 12/31/2015 CLINICAL DATA:  Diffuse large B-cell lymphoma, osseous metastases EXAM: CT GUIDED DEEP ILIAC BONE ASPIRATION AND CORE BIOPSY TECHNIQUE: The procedure, risks (including but not limited to bleeding, infection, organ damage ), benefits, and alternatives were explained to the patient. Questions regarding the procedure were encouraged and answered. The patient understands and consents to the procedure. Patient was placed supine on the CT gantry and limited axial scans through the pelvis were obtained. Appropriate skin entry site was identified. Skin site was marked, prepped with Betadine, draped in usual sterile fashion, and infiltrated locally with 1% lidocaine. Intravenous Fentanyl and Versed were administered as conscious sedation during continuous monitoring of the patient's level of consciousness and physiological / cardiorespiratory status by the radiology RN, with a total moderate sedation time of 9 minutes. Under CT fluoroscopic guidance a trocar bone needle was advanced into the right iliac bone just lateral to the sacroiliac joint using the OncoTrol device. Once needle tip  position was confirmed, coaxial core and aspiration samples were obtained. The final sample was obtained using the guiding needle itself, which was then removed. Post procedure scans show no hematoma or fracture. Patient tolerated procedure well. COMPLICATIONS: COMPLICATIONS none IMPRESSION: 1. Technically successful CT guided right iliac bone core and aspiration biopsy. Electronically Signed   By: Lucrezia Europe M.D.   On: 12/31/2015 10:20   Mr Outside Films Spine  Result Date: 12/18/2015 CLINICAL DATA:  This exam is stored here for comparison purposes only and was performed  at an outside facility.   Please contact the originating institution for any associated interpretation or report.   Dg Fluoro Guide Lumbar Puncture  Result Date: 01/01/2016 CLINICAL DATA:  65 year old male with Burkitt's lymphoma. Subsequent encounter. EXAM: DIAGNOSTIC LUMBAR PUNCTURE UNDER FLUOROSCOPIC GUIDANCE FLUOROSCOPY TIME:  If the device does not provide the exposure index: Fluoroscopy Time (in minutes and seconds):  20 second Number of Acquired Images:  2 PROCEDURE: Case discussed with staff. No clinical suspicion for intracranial abnormality. Informed consent was obtained from the patient prior to the procedure, including potential complications of headache, bleeding allergy, and pain. Time-out performed. With the patient prone, the lower back was prepped with Betadine. 1% Lidocaine was used for local anesthesia. Lumbar puncture was performed at the L2-3 level using a 22 gauge needle with return of initially minimally blood tinged CSF which cleared with an opening pressure of 12 cm water. Ten ml of CSF were obtained for laboratory studies. The patient tolerated the procedure well and there were no apparent complications. IMPRESSION: L2-3 lumbar puncture with collection of 10 cc of cerebral spinal fluid sent for labs as per request. Electronically Signed   By: Genia Del M.D.   On: 01/01/2016 14:01    Discharge Laboratory Values: . CBC    Component Value Date/Time   WBC 7.1 01/05/2016 0348   RBC 4.35 01/05/2016 0348   HGB 11.5 (L) 01/05/2016 0348   HGB 11.9 (L) 12/25/2015 1028   HCT 35.8 (L) 01/05/2016 0348   HCT 36.2 (L) 12/25/2015 1028   PLT 339 01/05/2016 0348   PLT 381 12/25/2015 1028   MCV 82.3 01/05/2016 0348   MCV 83.2 12/25/2015 1028   MCH 26.4 01/05/2016 0348   MCHC 32.1 01/05/2016 0348   RDW 13.7 01/05/2016 0348   RDW 14.0 12/25/2015 1028   LYMPHSABS 1.3 01/01/2016 1050   LYMPHSABS 1.8 12/25/2015 1028   MONOABS 0.8 01/01/2016 1050   MONOABS 0.7 12/25/2015 1028    EOSABS 0.0 01/01/2016 1050   EOSABS 0.2 12/25/2015 1028   BASOSABS 0.0 01/01/2016 1050   BASOSABS 0.1 12/25/2015 1028   . CMP     Component Value Date/Time   NA 135 01/05/2016 0348   NA 137 12/25/2015 1029   K 3.9 01/05/2016 0348   K 4.1 12/25/2015 1029   CL 99 (L) 01/05/2016 0348   CO2 27 01/05/2016 0348   CO2 29 12/25/2015 1029   GLUCOSE 123 (H) 01/05/2016 0348   GLUCOSE 135 12/25/2015 1029   BUN 20 01/05/2016 0348   BUN 15.9 12/25/2015 1029   CREATININE 0.55 (L) 01/05/2016 0348   CREATININE 0.8 12/25/2015 1029   CALCIUM 8.9 01/05/2016 0348   CALCIUM 10.3 12/25/2015 1029   PROT 5.8 (L) 01/05/2016 0348   PROT 7.5 12/25/2015 1029   ALBUMIN 3.1 (L) 01/05/2016 0348   ALBUMIN 3.6 12/25/2015 1029   AST 25 01/05/2016 0348   AST 20 12/25/2015 1029   ALT 44 01/05/2016 0348  ALT 19 12/25/2015 1029   ALKPHOS 70 01/05/2016 0348   ALKPHOS 110 12/25/2015 1029   BILITOT 0.6 01/05/2016 0348   BILITOT 0.39 12/25/2015 1029   GFRNONAA >60 01/05/2016 0348   GFRAA >60 01/05/2016 0348    Brief H and P:  65 year old male with newly diagnosed Burkitt's lymphoma with positive t(8;14) mutation and involvement of multiple nodal stations, sacrum, chest wall mass. Negative bone marrow biopsy. Negative CSF analysis. Patient was admitted for his first cycle of EPOCH-R which he tolerated without any prohibitive toxicities. Labs are stable. No evidence of tumor lysis syndrome. He has already started noticing a significant reduction in his left chest wall mass. -He will follow-up in our [place] for his Neulasta shot tomorrow. -Follow-up with Dr. Irene Limbo in clinic on 01/20/2016 to plan for the treatment. -We'll plan to start intrathecal methotrexate prophylaxis from cycle 3. -Planned to have cycle 2 EPOCH-R on 01/22/2016 stable. -All supportive medications provided. -Patient encouraged to avoid exposure to high-dose situations causing infection. -See previous note for all other  details.  Physical Exam at Discharge: BP (!) 154/72 (BP Location: Right Arm)   Pulse 74   Temp 97.7 F (36.5 C) (Oral)   Resp 16   Ht 6' 1"  (1.854 m)   Wt 190 lb (86.2 kg)   SpO2 100%   BMI 25.07 kg/m  GENERAL:alert, in no acute distress and comfortable SKIN: skin color, texture, turgor are normal, no rashes or significant lesions EYES: normal, conjunctiva are pink and non-injected, sclera clear OROPHARYNX:no exudate, no erythema and lips, buccal mucosa, and tongue normal  NECK: supple, no JVD, thyroid normal size, non-tender, without nodularity LYMPH: no palpable lymphadenopathy in the cervical, axillary or inguinal LUNGS: clear to auscultation with normal respiratory effort, large left anterior chest wall mass appears to have shrunk significantly HEART: regular rate &rhythm, no murmurs and no lower extremity edema ABDOMEN: abdomen soft, non-tender, normoactive bowel sounds  Musculoskeletal: Minimal left lower extremity swelling likely due to his left pelvic nodal mass PSYCH: alert & oriented x 3 with fluent speech NEURO: no focal motor/sensory deficits    Hospital Course:  Active Problems:   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  Activity:  Regular activity without any strenuous weightlifting.   Condition at Discharge:    stable improving   Signed: Dr. Sullivan Lone MD [place] [phone]  01/05/2016, 10:56 AM  Time spent discharging patient and giving discharge instructions more than 30 minutes

## 2016-01-05 NOTE — Progress Notes (Signed)
Manual calculation of BSA and chemotherpay dosing completed.  Verified by Jena Gauss, RN and Reyne Dumas RN

## 2016-01-05 NOTE — Progress Notes (Signed)
Patient tolerated cytoxan and Rituxan well,

## 2016-01-05 NOTE — Telephone Encounter (Signed)
Added apt per pof/nurse, pt in hospital will receive tx there, inj sched for 8/8 and pt is to see md on 8/22 but no availability so pt is sched for 8/23 , pt will get info in hospital

## 2016-01-05 NOTE — Discharge Instructions (Signed)
-   follow-up for Neulasta shot at the [place] on 01/06/2016 . -Follow-up with Dr. Irene Limbo for continued management of Burkitt's lymphoma on 01/20/2016 . -Called with any new concerning symptoms fevers or chills . -Infection prevention .-Avoid handling pet waste, farm animals . Avoid exposure to crowds , people with active infections , dusts or fumes . -Continue Dexamethasone '8mg'$  po BID with food for 3 days after chemotherapy then stop.

## 2016-01-06 ENCOUNTER — Encounter: Payer: Self-pay | Admitting: Hematology

## 2016-01-06 ENCOUNTER — Other Ambulatory Visit: Payer: Self-pay | Admitting: *Deleted

## 2016-01-06 ENCOUNTER — Ambulatory Visit (HOSPITAL_BASED_OUTPATIENT_CLINIC_OR_DEPARTMENT_OTHER): Payer: PRIVATE HEALTH INSURANCE

## 2016-01-06 ENCOUNTER — Other Ambulatory Visit: Payer: Self-pay | Admitting: Hematology

## 2016-01-06 VITALS — BP 130/70 | HR 85 | Temp 98.5°F | Resp 18

## 2016-01-06 DIAGNOSIS — Z5189 Encounter for other specified aftercare: Secondary | ICD-10-CM

## 2016-01-06 DIAGNOSIS — C8378 Burkitt lymphoma, lymph nodes of multiple sites: Secondary | ICD-10-CM

## 2016-01-06 DIAGNOSIS — C8338 Diffuse large B-cell lymphoma, lymph nodes of multiple sites: Secondary | ICD-10-CM

## 2016-01-06 MED ORDER — PEGFILGRASTIM INJECTION 6 MG/0.6ML ~~LOC~~
6.0000 mg | PREFILLED_SYRINGE | Freq: Once | SUBCUTANEOUS | Status: AC
  Administered 2016-01-06: 6 mg via SUBCUTANEOUS
  Filled 2016-01-06: qty 0.6

## 2016-01-06 NOTE — Progress Notes (Unsigned)
Met with patient and spouse to introduce myself as Estate manager/land agent. Went over qualifications for [employer] if interested in applying. Also asked patient if he had met his deductible for insurance and if they're covering at 100% or they may possibly need copay assistance through [employer] and [employer] for Neulasta. Patient states they were not sure but will contact their [place] to find out. Gave them my card to report this information back to me and that will let me know if he will need assistance. Asked patient to provide this information by next visit of 01/20/16. Patient verbalized understanding.

## 2016-01-06 NOTE — Patient Instructions (Signed)
Pegfilgrastim injection What is this medicine? PEGFILGRASTIM (PEG fil gra stim) is a long-acting granulocyte colony-stimulating factor that stimulates the growth of neutrophils, a type of white blood cell important in the body's fight against infection. It is used to reduce the incidence of fever and infection in patients with certain types of cancer who are receiving chemotherapy that affects the bone marrow, and to increase survival after being exposed to high doses of radiation. This medicine may be used for other purposes; ask your health care provider or pharmacist if you have questions. What should I tell my health care provider before I take this medicine? They need to know if you have any of these conditions: -kidney disease -latex allergy -ongoing radiation therapy -sickle cell disease -skin reactions to acrylic adhesives (On-Body Injector only) -an unusual or allergic reaction to pegfilgrastim, filgrastim, other medicines, foods, dyes, or preservatives -pregnant or trying to get pregnant -breast-feeding How should I use this medicine? This medicine is for injection under the skin. If you get this medicine at home, you will be taught how to prepare and give the pre-filled syringe or how to use the On-body Injector. Refer to the patient Instructions for Use for detailed instructions. Use exactly as directed. Take your medicine at regular intervals. Do not take your medicine more often than directed. It is important that you put your used needles and syringes in a special sharps container. Do not put them in a trash can. If you do not have a sharps container, call your pharmacist or healthcare provider to get one. Talk to your pediatrician regarding the use of this medicine in children. While this drug may be prescribed for selected conditions, precautions do apply. Overdosage: If you think you have taken too much of this medicine contact a poison control center or emergency room at  once. NOTE: This medicine is only for you. Do not share this medicine with others. What if I miss a dose? It is important not to miss your dose. Call your doctor or health care professional if you miss your dose. If you miss a dose due to an On-body Injector failure or leakage, a new dose should be administered as soon as possible using a single prefilled syringe for manual use. What may interact with this medicine? Interactions have not been studied. Give your health care provider a list of all the medicines, herbs, non-prescription drugs, or dietary supplements you use. Also tell them if you smoke, drink alcohol, or use illegal drugs. Some items may interact with your medicine. This list may not describe all possible interactions. Give your health care provider a list of all the medicines, herbs, non-prescription drugs, or dietary supplements you use. Also tell them if you smoke, drink alcohol, or use illegal drugs. Some items may interact with your medicine. What should I watch for while using this medicine? You may need blood work done while you are taking this medicine. If you are going to need a MRI, CT scan, or other procedure, tell your doctor that you are using this medicine (On-Body Injector only). What side effects may I notice from receiving this medicine? Side effects that you should report to your doctor or health care professional as soon as possible: -allergic reactions like skin rash, itching or hives, swelling of the face, lips, or tongue -dizziness -fever -pain, redness, or irritation at site where injected -pinpoint red spots on the skin -red or dark-brown urine -shortness of breath or breathing problems -stomach or side pain, or pain   at the shoulder -swelling -tiredness -trouble passing urine or change in the amount of urine Side effects that usually do not require medical attention (report to your doctor or health care professional if they continue or are  bothersome): -bone pain -muscle pain This list may not describe all possible side effects. Call your doctor for medical advice about side effects. You may report side effects to FDA at 1-800-FDA-1088. Where should I keep my medicine? Keep out of the reach of children. Store pre-filled syringes in a refrigerator between 2 and 8 degrees C (36 and 46 degrees F). Do not freeze. Keep in carton to protect from light. Throw away this medicine if it is left out of the refrigerator for more than 48 hours. Throw away any unused medicine after the expiration date. NOTE: This sheet is a summary. It may not cover all possible information. If you have questions about this medicine, talk to your doctor, pharmacist, or health care provider.    2016, Elsevier/Gold Standard. (2014-06-06 14:30:14)  

## 2016-01-07 ENCOUNTER — Encounter (HOSPITAL_COMMUNITY): Payer: Self-pay

## 2016-01-08 ENCOUNTER — Ambulatory Visit: Payer: PRIVATE HEALTH INSURANCE | Admitting: Hematology

## 2016-01-08 ENCOUNTER — Other Ambulatory Visit: Payer: Self-pay | Admitting: *Deleted

## 2016-01-09 ENCOUNTER — Other Ambulatory Visit: Payer: Self-pay | Admitting: *Deleted

## 2016-01-09 LAB — CHROMOSOME ANALYSIS, BONE MARROW

## 2016-01-09 LAB — TISSUE HYBRIDIZATION (BONE MARROW)-NCBH

## 2016-01-09 MED ORDER — MAGIC MOUTHWASH W/LIDOCAINE: 5.0000 mL | Freq: Four times a day (QID) | ORAL | 0 refills | Status: DC

## 2016-01-09 NOTE — Telephone Encounter (Signed)
Wife Erik Vaughn calling to say patient had chemotherapy for 5 days last week and now has white patches in his mouth, denies fever or pain. Is eating and using warm salt water rinses. Per cyndee bacon, dukes magic mouthwash called to [facility] in [place], wife notified. If not helpful and condition worsens, notify this office.

## 2016-01-11 ENCOUNTER — Encounter (HOSPITAL_COMMUNITY): Payer: Self-pay | Admitting: Emergency Medicine

## 2016-01-11 ENCOUNTER — Emergency Department (HOSPITAL_COMMUNITY)
Admission: EM | Admit: 2016-01-11 | Discharge: 2016-01-12 | Disposition: A | Discharge status: Home / Self Care | Payer: PRIVATE HEALTH INSURANCE | Attending: Emergency Medicine | Admitting: Emergency Medicine

## 2016-01-11 ENCOUNTER — Emergency Department (HOSPITAL_COMMUNITY): Payer: PRIVATE HEALTH INSURANCE

## 2016-01-11 DIAGNOSIS — Z87892 Personal history of anaphylaxis: Secondary | ICD-10-CM | POA: Diagnosis not present

## 2016-01-11 DIAGNOSIS — Z87891 Personal history of nicotine dependence: Secondary | ICD-10-CM | POA: Insufficient documentation

## 2016-01-11 DIAGNOSIS — Z7982 Long term (current) use of aspirin: Secondary | ICD-10-CM | POA: Insufficient documentation

## 2016-01-11 DIAGNOSIS — I10 Essential (primary) hypertension: Secondary | ICD-10-CM | POA: Diagnosis not present

## 2016-01-11 DIAGNOSIS — Z79899 Other long term (current) drug therapy: Secondary | ICD-10-CM | POA: Insufficient documentation

## 2016-01-11 DIAGNOSIS — Z8572 Personal history of non-Hodgkin lymphomas: Secondary | ICD-10-CM | POA: Insufficient documentation

## 2016-01-11 DIAGNOSIS — K122 Cellulitis and abscess of mouth: Secondary | ICD-10-CM

## 2016-01-11 HISTORY — DX: Malignant (primary) neoplasm, unspecified: C80.1

## 2016-01-11 LAB — BASIC METABOLIC PANEL WITH GFR
Anion gap: 9 (ref 5–15)
BUN: 16 mg/dL (ref 6–20)
CO2: 30 mmol/L (ref 22–32)
Calcium: 8.9 mg/dL (ref 8.9–10.3)
Chloride: 96 mmol/L — ABNORMAL LOW (ref 101–111)
Creatinine, Ser: 0.65 mg/dL (ref 0.61–1.24)
GFR calc Af Amer: >60 mL/min
GFR calc non Af Amer: >60 mL/min
Glucose, Bld: 127 mg/dL — ABNORMAL HIGH (ref 65–99)
Potassium: 4.2 mmol/L (ref 3.5–5.1)
Sodium: 135 mmol/L (ref 135–145)

## 2016-01-11 LAB — CBC WITH DIFFERENTIAL/PLATELET
Basophils Absolute: 0 K/uL (ref 0.0–0.1)
Basophils Relative: 0 %
Eosinophils Absolute: 0.1 K/uL (ref 0.0–0.7)
Eosinophils Relative: 3 %
HCT: 36.4 % — ABNORMAL LOW (ref 39.0–52.0)
Hemoglobin: 11.7 g/dL — ABNORMAL LOW (ref 13.0–17.0)
Lymphocytes Relative: 36 %
Lymphs Abs: 1.5 K/uL (ref 0.7–4.0)
MCH: 26.2 pg (ref 26.0–34.0)
MCHC: 32.1 g/dL (ref 30.0–36.0)
MCV: 81.4 fL (ref 78.0–100.0)
Monocytes Absolute: 0.6 K/uL (ref 0.1–1.0)
Monocytes Relative: 14 %
Neutro Abs: 2.1 K/uL (ref 1.7–7.7)
Neutrophils Relative %: 47 %
Platelets: 240 K/uL (ref 150–400)
RBC: 4.47 MIL/uL (ref 4.22–5.81)
RDW: 14.2 % (ref 11.5–15.5)
WBC: 4.3 K/uL (ref 4.0–10.5)

## 2016-01-11 MED ORDER — CLINDAMYCIN HCL 150 MG PO CAPS
450.0000 mg | ORAL_CAPSULE | Freq: Three times a day (TID) | ORAL | 0 refills | Status: AC
Start: 2016-01-11 — End: 2016-01-21

## 2016-01-11 MED ORDER — LIDOCAINE VISCOUS 2 % MT SOLN: 15.0000 mL | OROMUCOSAL | 0 refills | Status: DC | PRN

## 2016-01-11 MED ORDER — HEPARIN SOD (PORK) LOCK FLUSH 100 UNIT/ML IV SOLN
500.0000 [IU] | Freq: Once | INTRAVENOUS | Status: AC
  Administered 2016-01-11: 500 [IU]
  Filled 2016-01-11: qty 5

## 2016-01-11 MED ORDER — IOPAMIDOL (ISOVUE-300) INJECTION 61%
80.0000 mL | Freq: Once | INTRAVENOUS | Status: AC | PRN
  Administered 2016-01-11: 80 mL via INTRAVENOUS

## 2016-01-11 MED ORDER — BUPIVACAINE HCL (PF) 0.5 % IJ SOLN
10.0000 mL | Freq: Once | INTRAMUSCULAR | Status: AC
  Administered 2016-01-11: 10 mL
  Filled 2016-01-11: qty 30

## 2016-01-11 NOTE — ED Triage Notes (Addendum)
Patient presents for abscess to roof of mouth x3 days. Reports decreased PO intake d/t not being able to crew on that side.  Redness and swelling noted to right side of upper palate. History of lymphoma, last chemo Monday 8/7. Denies fever. A&O x4

## 2016-01-11 NOTE — ED Provider Notes (Signed)
Took patient care handoff report from [facility], PA-C. Plan: Review CT and, if abscess is isolated, perform needle I&D.  Ct Maxillofacial W Contrast  Result Date: 01/11/2016 CLINICAL DATA:  Initial evaluation for acute abscess that roof with map for 3 days. Swelling at right-sided upper palate. EXAM: CT MAXILLOFACIAL WITH CONTRAST TECHNIQUE: Multidetector CT imaging of the maxillofacial structures was performed with intravenous contrast. Multiplanar CT image reconstructions were also generated. A small metallic BB was placed on the right temple in order to reliably differentiate right from left. CONTRAST:  79m ISOVUE-300 IOPAMIDOL (ISOVUE-300) INJECTION 61% COMPARISON:  None. FINDINGS: Visualized portions of the brain are within normal limits without acute abnormality. Globes and orbits within normal limits. The scattered dental disease present. There is dehiscence of the alveolar ridge along its lingual aspect at the base of the right first/second maxillary molar (Series 3, image 35). Adjacent subperiosteal abscess measures 9 x 7 x 10 mm (series 2, image 36). No other abscess at the roof of maps. No other significant inflammatory changes about the dentition, although evaluation somewhat limited due to dental amalgam. Mild-to-moderate mucosal thickening at the floor of the right maxilla sinus, with more minimal mucosal thickening at the floor left maxillary sinus. Paranasal sinuses are otherwise largely clear. Remainder of the visualized soft tissues of the face demonstrate no acute abnormality. IMPRESSION: 1. 9 x 7 x 10 mm subperiosteal abscess at the right roof of mouth, just medial to the base of the right first and second right maxillary molars. Abscesses is likely odontogenic in origin. 2. Mild maxillary sinus disease, right worse than left. Electronically Signed   By: BJeannine BogaM.D.   On: 01/11/2016 22:40    INCISION AND DRAINAGE Performed by: SLorayne BenderConsent: Verbal consent  obtained. Risks and benefits: risks, benefits and alternatives were discussed Type: abscess  Body area: Right side of the hard palate  Anesthesia: local infiltration  Needle aspiration with 18-gauge needle  Local anesthetic: Marcaine 0.5%   Anesthetic total: 1 ml  Complexity: complex Blunt dissection to break up loculations  Drainage: purulent and bloody   Drainage amount: minor   Packing material: None   Patient tolerance: Patient tolerated the procedure well with no immediate complications.   Abscess was noted to be already draining spontaneously. Needle aspiration and suction to assist with drainage. Patient advised to follow-up with oncology tomorrow morning. The patient was given instructions for home care as well as return precautions. Patient voices understanding of these instructions, accepts the plan, and is comfortable with discharge.  Vitals:   01/11/16 1728 01/11/16 2042 01/11/16 2240  BP: 133/75 124/72 126/72  Pulse: 95 96 90  Resp: '16 16 16  '$ Temp: 98.8 F (37.1 C)    TempSrc: Oral    SpO2: 98% 96% 96%  Weight:  81.6 kg   Height:  6' (1.829 m)       SLorayne Bender PA-C 01/12/16 0030    SLorayne Bender PA-C 01/12/16 0031    CDuffy Bruce MD 01/13/16 [id number]

## 2016-01-11 NOTE — ED Provider Notes (Signed)
[facility] DEPT Provider Note   CSN: [id number] Arrival date & time: 01/11/16  1723  First Provider Contact:  First MD Initiated Contact with Patient 01/11/16 1943        History   Chief Complaint Chief Complaint  Patient presents with  . Abscess    HPI Erik Vaughn is a 65 y.o. 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 Patient was recently diagnosed metastatic cancer. He had his first cycle of EPOCH-R less than one week ago. At that time labs were stable and there was no evidence of tumor lysis syndrome. He has also had first injection of Neulasta. Patient states that 4 days ago he noticed a bump in the roof of his mouth. Patient states that this continued to get progressively larger and more painful. Today he states that it turned purple and then began draining pus. Patient states it is painful when he swallows and is having a hard time eating due to this. He denies any fevers, chills, recent URI or sinus infections. He denies any dental pain. He denies any injury to his mouth that he is aware of.  HPI  Past Medical History:  Diagnosis Date  . Cancer ([place])    Lymphoma  . Hypertension   . PAD (peripheral artery disease) ([place]) left leg    Patient Active Problem List   Diagnosis Date Noted  . Burkitt lymphoma of lymph nodes of multiple regions ([place]) 01/01/2016  . Encounter for antineoplastic chemotherapy   . Diffuse large B-cell lymphoma of lymph nodes of multiple regions ([place]) 12/25/2015    Past Surgical History:  Procedure Laterality Date  . BRAIN SURGERY    . IR GENERIC HISTORICAL  12/31/2015   IR FLUORO GUIDE CV LINE RIGHT 12/31/2015 Arne Cleveland, MD [facility]-INTER[facility] RAD  . IR GENERIC HISTORICAL  12/31/2015   IR US GUIDE VASC ACCESS RIGHT 12/31/2015 Arne Cleveland, MD [facility]INTER[facility]  . left salivary Left    removed       Home Medications     Prior to Admission medications   Medication Sig Start Date End Date Taking? Authorizing Provider  allopurinol (ZYLOPRIM) 300 MG tablet Take 1 tablet (300 mg total) by mouth daily. Patient taking differently: Take 300 mg by mouth daily with lunch.  12/25/15  Yes Brunetta Genera, MD  amLODipine (NORVASC) 10 MG tablet Take 10 mg by mouth every evening.  11/19/15  Yes Historical Provider, MD  aspirin EC 81 MG tablet Take 81 mg by mouth at bedtime.   Yes Historical Provider, MD  Cholecalciferol (VITAMIN D3) 2000 units TABS Take 2,000 Units by mouth daily with lunch.    Yes Historical Provider, MD  clopidogrel (PLAVIX) 75 MG tablet Take 75 mg by mouth at bedtime.    Yes Historical Provider, MD  dexamethasone (DECADRON) 4 MG tablet Take 2 tablets (8 mg total) by mouth 2 (two) times daily with a meal. Take two times a day starting the day after chemotherapy for 3 days. 01/05/16  Yes Brunetta Genera, MD  lisinopril (PRINIVIL,ZESTRIL) 20 MG tablet Take 20 mg by mouth every evening.  11/19/15  Yes Historical Provider, MD  LORazepam (ATIVAN) 0.5 MG tablet Take 1 tablet (0.5 mg total) by mouth every 8 (eight) hours as needed for anxiety. Could take 2 tabs ('1mg'$ ) 30 mins prior to PET/CT scan 12/18/15  Yes Gautam Juleen China, MD  LORazepam (  ATIVAN) 0.5 MG tablet Take 1 tablet (0.5 mg total) by mouth every 6 (six) hours as needed (Nausea or vomiting). 01/05/16  Yes Brunetta Genera, MD  magic mouthwash w/lidocaine SOLN Take 5 mLs by mouth 4 (four) times daily. 01/09/16  Yes Susanne Borders, NP  Multiple Vitamins-Minerals (CENTRUM SILVER PO) Take 1 tablet by mouth daily with breakfast.    Yes Historical Provider, MD  ondansetron (ZOFRAN) 8 MG tablet Take 1 tablet (8 mg total) by mouth 2 (two) times daily. Take two times a day starting the day after chemo for 3 days. Then take two times a day as needed for nausea or vomiting. 01/05/16  Yes Brunetta Genera, MD  oxyCODONE (OXYCONTIN) 10 mg 12 hr tablet Take 1  tablet (10 mg total) by mouth every 12 (twelve) hours. 01/05/16  Yes Brunetta Genera, MD  oxyCODONE-acetaminophen (PERCOCET/ROXICET) 5-325 MG tablet Take 1-2 tablets by mouth every 4 (four) hours as needed for severe pain. 12/18/15  Yes Brunetta Genera, MD  polyethylene glycol [facility]) packet Take 17 g by mouth daily. Patient taking differently: Take 17 g by mouth daily as needed for moderate constipation.  12/18/15  Yes Brunetta Genera, MD  prochlorperazine (COMPAZINE) 10 MG tablet Take 1 tablet (10 mg total) by mouth every 6 (six) hours as needed (Nausea or vomiting). 01/05/16  Yes Brunetta Genera, MD  prochlorperazine (COMPAZINE) 25 MG suppository Place 1 suppository (25 mg total) rectally every 12 (twelve) hours as needed for nausea. 01/05/16  Yes Brunetta Genera, MD  senna-docusate (SENNA S) 8.6-50 MG tablet Take 2 tablets by mouth at bedtime. Patient taking differently: Take 2 tablets by mouth at bedtime as needed for moderate constipation.  12/18/15  Yes Brunetta Genera, MD  vitamin C (ASCORBIC ACID) 500 MG tablet Take 500 mg by mouth 2 (two) times daily.   Yes Historical Provider, MD    Family History No family history on file.  Social History Social History  Substance Use Topics  . Smoking status: Former Smoker    Packs/day: 2.00    Quit date: 12/22/2005  . Smokeless tobacco: Never Used  . Alcohol use Yes     Comment: occasionally     Allergies   Review of patient's allergies indicates no known allergies.   Review of Systems Review of Systems  All other systems reviewed and are negative.    Physical Exam Updated Vital Signs BP 124/72 (BP Location: Left Arm)   Pulse 96   Temp 98.8 F (37.1 C) (Oral)   Resp 16   Ht 6' (1.829 m)   Wt 81.6 kg   SpO2 96%   BMI 24.41 kg/m   Physical Exam  Constitutional: He is oriented to person, place, and time. He appears well-developed and well-nourished. No distress.  HENT:  Head: Normocephalic and atraumatic.   Mouth/Throat: No oropharyngeal exudate.  2cm abscess on roof of mouth with active drainage. Poor dentition.   Eyes: Conjunctivae and EOM are normal. Pupils are equal, round, and reactive to light. Right eye exhibits no discharge. Left eye exhibits no discharge. No scleral icterus.  Cardiovascular: Normal rate, regular rhythm, normal heart sounds and intact distal pulses.  Exam reveals no gallop and no friction rub.   No murmur heard. Pulmonary/Chest: Effort normal and breath sounds normal. No respiratory distress. He has no wheezes. He has no rales. He exhibits no tenderness.  Abdominal: Soft. He exhibits no distension. There is no tenderness. There is no guarding.  Musculoskeletal: Normal range of motion. He exhibits no edema.  Neurological: He is alert and oriented to person, place, and time.  Strength 5/5 throughout. No sensory deficits. No gait abnormality. No dysmetria. No slurred speech. No facial droop. Negative pronator drift.    Skin: Skin is warm and dry. No rash noted. He is not diaphoretic. No erythema. No pallor.  Psychiatric: He has a normal mood and affect. His behavior is normal.  Nursing note and vitals reviewed.    ED Treatments / Results  Labs (all labs ordered are listed, but only abnormal results are displayed) Labs Reviewed  BASIC METABOLIC PANEL  CBC WITH DIFFERENTIAL/PLATELET  I-STAT CHEM 8, ED    EKG  EKG Interpretation None       Radiology No results found.  Procedures Procedures (including critical care time)  Medications Ordered in ED Medications - No data to display   Initial Impression / Assessment and Plan / ED Course  I have reviewed the triage vital signs and the nursing notes.  Pertinent labs & imaging results that were available during my care of the patient were reviewed by me and considered in my medical decision making (see chart for details).  Clinical Course    Patient with severe metastatic cancer, recently initiated on  chemotherapy presents to the ED with an abscess to the roof of his mouth. He Is Well Demarcated and Is Actively Draining. Patient is afebrile. No signs of sepsis. No sign of neutropenia or thrombocytopenia on lab work. CT maxillofacial obtained to ensure that abscess is not tracking into sinus cavity. Patient signed out to Arlean Hopping PA-C pending CT. CT shows uncomplicated abscess, recommend aspiration and reactive. DC with antibiotics. If CT shows complicated abscess, address accordingly.  Patient was discussed with and seen by Dr. Ellender Hose who agrees with the treatment plan.     Final Clinical Impressions(s) / ED Diagnoses   Final diagnoses:  Abscess of mouth    New Prescriptions New Prescriptions   No medications on file     Carlos Levering, PA-C 01/11/16 2218    Duffy Bruce, MD 01/13/16 [phone]

## 2016-01-11 NOTE — Discharge Instructions (Signed)
You have been seen today for an abscess on the roof of your mouth. Your imaging showed an abscess, but no intrusion into the sinuses. No significant abnormalities on your labs. Follow up with oncology as soon as possible. Return to the ED as needed. Use a liquid lidocaine as needed for mouth pain. Swish with the lidocaine and spit it out. Do not swallow it.

## 2016-01-12 MED ORDER — HEPARIN SOD (PORK) LOCK FLUSH 100 UNIT/ML IV SOLN: 500.0000 [IU] | INTRAVENOUS | Status: DC | PRN

## 2016-01-12 MED ORDER — HEPARIN SOD (PORK) LOCK FLUSH 100 UNIT/ML IV SOLN: 500.0000 [IU] | INTRAVENOUS | Status: DC

## 2016-01-20 ENCOUNTER — Ambulatory Visit (HOSPITAL_BASED_OUTPATIENT_CLINIC_OR_DEPARTMENT_OTHER): Payer: PRIVATE HEALTH INSURANCE | Admitting: Hematology

## 2016-01-20 ENCOUNTER — Ambulatory Visit (HOSPITAL_BASED_OUTPATIENT_CLINIC_OR_DEPARTMENT_OTHER): Payer: PRIVATE HEALTH INSURANCE

## 2016-01-20 ENCOUNTER — Encounter: Payer: Self-pay | Admitting: Hematology

## 2016-01-20 VITALS — BP 142/64 | HR 91 | Temp 98.2°F | Resp 18 | Ht 72.0 in | Wt 186.3 lb

## 2016-01-20 DIAGNOSIS — C8378 Burkitt lymphoma, lymph nodes of multiple sites: Secondary | ICD-10-CM

## 2016-01-20 DIAGNOSIS — G893 Neoplasm related pain (acute) (chronic): Secondary | ICD-10-CM

## 2016-01-20 DIAGNOSIS — C7951 Secondary malignant neoplasm of bone: Secondary | ICD-10-CM

## 2016-01-20 DIAGNOSIS — I10 Essential (primary) hypertension: Secondary | ICD-10-CM

## 2016-01-20 DIAGNOSIS — S3210XG Unspecified fracture of sacrum, subsequent encounter for fracture with delayed healing: Secondary | ICD-10-CM

## 2016-01-20 LAB — COMPREHENSIVE METABOLIC PANEL
ALT: 34 U/L (ref 0–55)
ANION GAP: 8 meq/L (ref 3–11)
AST: 20 U/L (ref 5–34)
Albumin: 3.3 g/dL — ABNORMAL LOW (ref 3.5–5.0)
Alkaline Phosphatase: 114 U/L (ref 40–150)
BUN: 15.8 mg/dL (ref 7.0–26.0)
CALCIUM: 9.6 mg/dL (ref 8.4–10.4)
CHLORIDE: 104 meq/L (ref 98–109)
CO2: 26 meq/L (ref 22–29)
CREATININE: 0.7 mg/dL (ref 0.7–1.3)
EGFR: >90 mL/min/{1.73_m2} (ref >90)
Glucose: 105 mg/dl (ref 70–140)
POTASSIUM: 4.5 meq/L (ref 3.5–5.1)
Sodium: 138 mEq/L (ref 136–145)
TOTAL PROTEIN: 6.6 g/dL (ref 6.4–8.3)
Total Bilirubin: <0.3 mg/dL (ref 0.20–1.20)

## 2016-01-20 LAB — CBC & DIFF AND RETIC
BASO%: 0.9 % (ref 0.0–2.0)
BASOS ABS: 0.1 10*3/uL (ref 0.0–0.1)
EOS%: 0.8 % (ref 0.0–7.0)
Eosinophils Absolute: 0.1 10*3/uL (ref 0.0–0.5)
HEMATOCRIT: 33.8 % — AB (ref 38.4–49.9)
HGB: 11.2 g/dL — ABNORMAL LOW (ref 13.0–17.1)
IMMATURE RETIC FRACT: 11.1 % — AB (ref 3.00–10.60)
LYMPH%: 19 % (ref 14.0–49.0)
MCH: 27.2 pg (ref 27.2–33.4)
MCHC: 33.1 g/dL (ref 32.0–36.0)
MCV: 82 fL (ref 79.3–98.0)
MONO#: 0.6 10*3/uL (ref 0.1–0.9)
MONO%: 7 % (ref 0.0–14.0)
NEUT#: 6.1 10*3/uL (ref 1.5–6.5)
NEUT%: 72.3 % (ref 39.0–75.0)
PLATELETS: 276 10*3/uL (ref 140–400)
RBC: 4.12 10*6/uL — AB (ref 4.20–5.82)
RDW: 15 % — ABNORMAL HIGH (ref 11.0–14.6)
Retic %: 1.48 % (ref 0.80–1.80)
Retic Ct Abs: 60.98 10*3/uL (ref 34.80–93.90)
WBC: 8.5 10*3/uL (ref 4.0–10.3)
lymph#: 1.6 10*3/uL (ref 0.9–3.3)

## 2016-01-20 LAB — LACTATE DEHYDROGENASE: LDH: 196 U/L (ref 125–245)

## 2016-01-20 LAB — URIC ACID: URIC ACID, SERUM: 5 mg/dL (ref 2.6–7.4)

## 2016-01-20 MED ORDER — CHLORHEXIDINE GLUCONATE 0.12 % MT SOLN: 15.0000 mL | Freq: Three times a day (TID) | OROMUCOSAL | 1 refills | Status: DC

## 2016-01-20 MED ORDER — OXYCODONE HCL 5 MG PO TABS: 5.0000 mg | ORAL_TABLET | Freq: Four times a day (QID) | ORAL | 0 refills | Status: DC | PRN

## 2016-01-20 NOTE — Progress Notes (Signed)
[name]    HEMATOLOGY/ONCOLOGY CLINIC NOTE  Date of Service: 01/20/2016  Patient Care Team: Ernestene Kiel, MD as PCP - General (Internal Medicine) [name], MD as Consulting Physician (Orthopedic Surgery)  CHIEF COMPLAINTS/PURPOSE OF CONSULTATION:  Newly diagnosed Metastatic Malignancy  HISTORY OF PRESENTING ILLNESS:  Please see my previous note for details  INTERVAL HISTORY  [name] is here for a follow-up with his wife. He notes that his left chest wall mass is not palpable anymore. His back pain has much improved and he is only using when necessary Percocets and has discontinued on his OxyContin. He notes improved control of his bladder with no significant incontinence. Was in the emergency room on 01/11/2016 with a odontogenic abscess which was draining. He has been treated with clindamycin and was given a prescription for 30 days. He notes no dental pains swelling or redness at this time. No fevers or chills. No other acute new symptoms.  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   He smokes about 2 packs a day for 40 years.  SURGICAL HISTORY:  #1 left submandibular salivary gland benign tumor excision  SOCIAL HISTORY: Social History   Social History  . Marital status: Married    Spouse name: N/A  . Number of children: N/A  . Years of education: N/A   Occupational History  . Not on file.   Social History Main Topics  . Smoking status: Former Smoker    Packs/day: 2.00    Quit date: 12/22/2005  . Smokeless tobacco: Never Used  . Alcohol use Yes     Comment: occasionally  . Drug use: Unknown  . Sexual activity: Not on file   Other Topics Concern  . Not on file   Social History Narrative  . No narrative on file  smoked 2 packs of cigarettes per day for 40 years quit  about 8-9 years ago. Denies significant alcohol use. 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  FAMILY HISTORY:  Sister had breast cancer at age 50 years. Unknown if she had genetic testing One maternal uncle had melanoma Second maternal uncle had pancreatic cancer under the age of 32yrPaternal uncle with prostate cancer Dad had prostate cancer at age 3845years   ALLERGIES:  has No Known Allergies.  MEDICATIONS:  Current Outpatient Prescriptions  Medication Sig Dispense Refill  . allopurinol (ZYLOPRIM) 300 MG tablet Take 1 tablet (300 mg total) by mouth daily. (Patient taking differently: Take 300 mg by mouth daily with lunch. ) 60 tablet 0  . amLODipine (NORVASC) 10 MG tablet Take 10 mg by mouth every evening.     .[name] Kitchenaspirin EC 81 MG tablet Take 81 mg by mouth at bedtime.    . chlorhexidine (PERIDEX) 0.12 % solution Use as directed 15 mLs in the mouth or throat 3 (three) times daily. 473 mL 1  . Cholecalciferol (VITAMIN D3) 2000 units TABS Take 2,000 Units by mouth daily with lunch.     . clindamycin (CLEOCIN) 150 MG capsule Take 3 capsules (450 mg total) by mouth 3 (three) times daily. 90 capsule 0  . clopidogrel (PLAVIX) 75 MG tablet Take 75 mg by mouth at  bedtime.     [name] dexamethasone (DECADRON) 4 MG tablet Take 2 tablets (8 mg total) by mouth 2 (two) times daily with a meal. Take two times a day starting the day after chemotherapy for 3 days. 30 tablet 1  . lidocaine (XYLOCAINE) 2 % solution Use as directed 15 mLs in the mouth or throat as needed for mouth pain. 100 mL 0  . lisinopril (PRINIVIL,ZESTRIL) 20 MG tablet Take 20 mg by mouth every evening.     [name] LORazepam (ATIVAN) 0.5 MG tablet Take 1 tablet (0.5 mg total) by mouth every 6 (six) hours as needed (Nausea or vomiting). 30 tablet 0  . magic mouthwash  w/lidocaine SOLN Take 5 mLs by mouth 4 (four) times daily. 120 mL 0  . Multiple Vitamins-Minerals (CENTRUM SILVER PO) Take 1 tablet by mouth daily with breakfast.     . ondansetron (ZOFRAN) 8 MG tablet Take 1 tablet (8 mg total) by mouth 2 (two) times daily. Take two times a day starting the day after chemo for 3 days. Then take two times a day as needed for nausea or vomiting. 30 tablet 1  . oxyCODONE (OXY IR/ROXICODONE) 5 MG immediate release tablet Take 1-2 tablets (5-10 mg total) by mouth every 6 (six) hours as needed for moderate pain or severe pain. 60 tablet 0  . polyethylene glycol (MIRALAX) packet Take 17 g by mouth daily. (Patient taking differently: Take 17 g by mouth daily as needed for moderate constipation. ) 30 each 1  . prochlorperazine (COMPAZINE) 10 MG tablet Take 1 tablet (10 mg total) by mouth every 6 (six) hours as needed (Nausea or vomiting). 30 tablet 1  . prochlorperazine (COMPAZINE) 25 MG suppository Place 1 suppository (25 mg total) rectally every 12 (twelve) hours as needed for nausea. 12 suppository 3  . senna-docusate (SENNA S) 8.6-50 MG tablet Take 2 tablets by mouth at bedtime. (Patient taking differently: Take 2 tablets by mouth at bedtime as needed for moderate constipation. ) 60 tablet 1  . vitamin C (ASCORBIC ACID) 500 MG tablet Take 500 mg by mouth 2 (two) times daily.     No current facility-administered medications for this visit.     REVIEW OF SYSTEMS:    10 Point review of Systems was done is negative except as noted above.  PHYSICAL EXAMINATION: ECOG PERFORMANCE STATUS: 1 - Symptomatic but completely ambulatory  . Vitals:   01/20/16 0912  BP: (!) 142/64  Pulse: 91  Resp: 18  Temp: 98.2 F (36.8 C)   Filed Weights   01/20/16 0912  Weight: 186 lb 4.8 oz (84.5 kg)   .Body mass index is 25.27 kg/m.  GENERAL:alert, in no acute distress and comfortable SKIN: skin color, texture, turgor are normal, no rashes or significant lesions EYES: normal,  conjunctiva are pink and non-injected, sclera clear OROPHARYNX:no exudate, no erythema and lips, buccal mucosa, and tongue normal  NECK: supple, no JVD, thyroid normal size, non-tender, without nodularity LYMPH:  no palpable lymphadenopathy in the cervical, axillary or inguinal LUNGS: clear to auscultation with normal respiratory effort, large left anterior chest wall mass noted appears nontender and fixed. HEART: regular rate & rhythm,  no murmurs and no lower extremity edema ABDOMEN: abdomen soft, non-tender, normoactive bowel sounds  Musculoskeletal: no cyanosis of digits and no clubbing  PSYCH: alert & oriented x 3 with fluent speech NEURO: no focal motor/sensory deficits  LABORATORY DATA:  I have reviewed the data as listed  . CBC Latest Ref Rng & Units  01/11/2016 01/05/2016 01/04/2016  WBC 4.0 - 10.5 K/uL 4.3 7.1 10.6(H)  Hemoglobin 13.0 - 17.0 g/dL 11.7(L) 11.5(L) 11.6(L)  Hematocrit 39.0 - 52.0 % 36.4(L) 35.8(L) 35.5(L)  Platelets 150 - 400 K/uL 240 339 386   . CBC    Component Value Date/Time   WBC 4.3 01/11/2016 2021   RBC 4.47 01/11/2016 2021   HGB 11.7 (L) 01/11/2016 2021   HGB 11.9 (L) 12/25/2015 1028   HCT 36.4 (L) 01/11/2016 2021   HCT 36.2 (L) 12/25/2015 1028   PLT 240 01/11/2016 2021   PLT 381 12/25/2015 1028   MCV 81.4 01/11/2016 2021   MCV 83.2 12/25/2015 1028   MCH 26.2 01/11/2016 2021   MCHC 32.1 01/11/2016 2021   RDW 14.2 01/11/2016 2021   RDW 14.0 12/25/2015 1028   LYMPHSABS 1.5 01/11/2016 2021   LYMPHSABS 1.8 12/25/2015 1028   MONOABS 0.6 01/11/2016 2021   MONOABS 0.7 12/25/2015 1028   EOSABS 0.1 01/11/2016 2021   EOSABS 0.2 12/25/2015 1028   BASOSABS 0.0 01/11/2016 2021   BASOSABS 0.1 12/25/2015 1028   . Lab Results  Component Value Date   LDH 219 12/25/2015   . CMP Latest Ref Rng & Units 01/11/2016 01/05/2016 01/04/2016  Glucose 65 - 99 mg/dL 127(H) 123(H) 121(H)  BUN 6 - 20 mg/dL 16 20 21(H)  Creatinine 0.61 - 1.24 mg/dL 0.65 0.55(L) 0.73    Sodium 135 - 145 mmol/L 135 135 137  Potassium 3.5 - 5.1 mmol/L 4.2 3.9 4.0  Chloride 101 - 111 mmol/L 96(L) 99(L) 100(L)  CO2 22 - 32 mmol/L 30 27 29   Calcium 8.9 - 10.3 mg/dL 8.9 8.9 9.1  Total Protein 6.5 - 8.1 g/dL - 5.8(L) 6.1(L)  Total Bilirubin 0.3 - 1.2 mg/dL - 0.6 0.7  Alkaline Phos 38 - 126 U/L - 70 80  AST 15 - 41 U/L - 25 24  ALT 17 - 63 U/L - 44 42   Component     Latest Ref Rng & Units 12/25/2015  LDH     125 - 245 U/L 219  Uric Acid, Serum     2.6 - 7.4 mg/dl 7.4  Hep B Core Ab, Tot     Negative Negative  Hepatitis B Surface Ag     Negative Negative  HIV     Non Reactive Non Reactive      RADIOGRAPHIC STUDIES: I have personally reviewed the radiological images as listed and agreed with the findings in the report. Nm Pet Image Initial (pi) Skull Base To Thigh  Result Date: 12/25/2015 CLINICAL DATA:  Initial treatment strategy for diffuse large B-cell lymphoma with osseous metastases. EXAM: NUCLEAR MEDICINE PET SKULL BASE TO THIGH TECHNIQUE: 9.29 mCi F-18 FDG was injected intravenously. Full-ring PET imaging was performed from the skull base to thigh after the radiotracer. CT data was obtained and used for attenuation correction and anatomic localization. FASTING BLOOD GLUCOSE:  Value: 123 mg/dl COMPARISON:  None. FINDINGS: NECK No hypermetabolic lymph nodes in the neck. CHEST 13 x 11 mm posterior right upper lobe/ right perihilar nodule (series 8/ image 25), max SUV 18.4, suspicious for pulmonary metastasis. Trace left pleural effusion. 2.3 x 1.9 cm left paratracheal node (series 4/ image 57), max SUV 36.8. Additional 8 mm short axis left infrahilar node (series 4/ image 80), max SUV 13.0. Additional small mediastinal lymph nodes, although without appreciable hypermetabolism. Three vessel coronary atherosclerosis. Mild atherosclerotic calcifications of the abdominal aorta. ABDOMEN/PELVIS No abnormal hypermetabolic activity within the liver, pancreas,  adrenal glands, or  spleen. 7.1 x 10.6 cm aggregate nodal mass in the right mid abdomen (series 4/ image 147), max SUV 41.5. 6.4 x 5.3 cm left external iliac nodal mass (series 4/image 137), max SUV 38.7. Additional foci of hypermetabolism along the right anterior abdominal wall and right abdominal mesentery. Prostatomegaly, with enlargement of the central gland which indents the base of the bladder. Mildly thick-walled bladder. Atherosclerotic calcifications the abdominal aorta and branch vessels. SKELETON 4.9 x 6.8 cm left anterior chest wall metastasis involving the right anterior 2nd and 3rd rib (series 4/ image 80), max SUV 37.2. 4.8 x 5.9 cm left posterior chest wall metastasis (series 4/ image 91), with associated pathologic fracture of the left posterior 9th rib, max SUV 48.6. Left sacral metastasis (series 4/ image 163), max SUV 31.1. Additional hypermetabolism involving the left acetabulum and iliac bone. Right proximal humeral metastasis, max SUV 39.9. IMPRESSION: Widespread lymphoma in the chest, abdomen, and pelvis, as above. 13 mm posterior right upper lobe pulmonary nodule, suspicious for pulmonary metastasis. Upper thoracic and left perihilar nodal lymphoma. Large nodal masses in the right mid abdomen and left pelvis. Osseous metastases involving the left anterior and left posterior chest wall, with pathologic fracture of the left posterior 9th rib. Additional osseous metastases involving the left sacral and right proximal humerus. Additional ancillary findings as above. Electronically Signed   By: Julian Hy M.D.   On: 12/25/2015 10:53  US Biopsy  Result Date: 12/23/2015 CLINICAL DATA:  Large mass of the left chest wall as well as destructive lesion of the left ninth rib and sacral mass. The patient presents for biopsy of the chest wall mass. EXAM: ULTRASOUND GUIDED CORE BIOPSY OF LEFT CHEST WALL MASS MEDICATIONS: 1.0 mg IV Versed; 50 mcg IV Fentanyl Total Moderate Sedation Time: 9 minutes. The patient's  level of consciousness and physiologic status were continuously monitored during the procedure by Radiology nursing. PROCEDURE: The procedure, risks, benefits, and alternatives were explained to the patient. Questions regarding the procedure were encouraged and answered. The patient understands and consents to the procedure. A time-out was performed prior to initiating the procedure. The left chest wall was prepped with chlorhexidine in a sterile fashion, and a sterile drape was applied covering the operative field. A sterile gown and sterile gloves were used for the procedure. Local anesthesia was provided with 1% Lidocaine. A left chest wall mass was localized by ultrasound. Seven separate 16 gauge core biopsy samples were obtained and submitted in both saline and formalin. Post biopsy imaging was performed with ultrasound. COMPLICATIONS: None. FINDINGS: Large hypoechoic, solid oval shaped left chest wall mass is identified in the left pectoral region. Solid tissue was obtained. IMPRESSION: Ultrasound-guided core biopsy performed of a large left chest wall mass. Electronically Signed   By: Aletta Edouard M.D.   On: 12/23/2015 16:50  Ir Fluoro Guide Cv Line Right  Result Date: 12/31/2015 CLINICAL DATA:  Diffuse large B-cell lymphoma, needs durable venous access for chemotherapy EXAM: TUNNELED PORT CATHETER PLACEMENT WITH ULTRASOUND AND FLUOROSCOPIC GUIDANCE FLUOROSCOPY TIME:  6 seconds, 2 mGy ANESTHESIA/SEDATION: Intravenous Fentanyl and Versed were administered as conscious sedation during continuous monitoring of the patient's level of consciousness and physiological / cardiorespiratory status by the radiology RN, with a total moderate sedation time of 15 minutes. TECHNIQUE: The procedure, risks, benefits, and alternatives were explained to the patient. Questions regarding the procedure were encouraged and answered. The patient understands and consents to the procedure. As antibiotic prophylaxis, cefazolin 2  g  was ordered pre-procedure and administered intravenously within one hour of incision. Patency of the right IJ vein was confirmed with ultrasound with image documentation. An appropriate skin site was determined. Skin site was marked. Region was prepped using maximum barrier technique including cap and mask, sterile gown, sterile gloves, large sterile sheet, and Chlorhexidine as cutaneous antisepsis. The region was infiltrated locally with 1% lidocaine. Under real-time ultrasound guidance, the right IJ vein was accessed with a 21 gauge micropuncture needle; the needle tip within the vein was confirmed with ultrasound image documentation. Needle was exchanged over a 018 guidewire for transitional dilator which allowed passage of the [facility] wire into the IVC. Over this, the transitional dilator was exchanged for a 5 [place] MPA catheter. A small incision was made on the right anterior chest wall and a subcutaneous pocket fashioned. The power-injectable port was positioned and its catheter tunneled to the right IJ dermatotomy site. The MPA catheter was exchanged over an Amplatz wire for a peel-away sheath, through which the port catheter, which had been trimmed to the appropriate length, was advanced and positioned under fluoroscopy with its tip at the cavoatrial junction. Spot chest radiograph confirms good catheter position and no pneumothorax. The pocket was closed with deep interrupted and subcuticular continuous 3-0 Monocryl sutures. The port was flushed per protocol. The incisions were covered with Dermabond then covered with a sterile dressing. COMPLICATIONS: COMPLICATIONS None immediate IMPRESSION: Technically successful right IJ power-injectable port catheter placement. Ready for routine use. Electronically Signed   By: Lucrezia Europe M.D.   On: 12/31/2015 10:20   Ir US Guide Vasc Access Right  Result Date: 12/31/2015 CLINICAL DATA:  Diffuse large B-cell lymphoma, needs durable venous access for chemotherapy  EXAM: TUNNELED PORT CATHETER PLACEMENT WITH ULTRASOUND AND FLUOROSCOPIC GUIDANCE FLUOROSCOPY TIME:  6 seconds, 2 mGy ANESTHESIA/SEDATION: Intravenous Fentanyl and Versed were administered as conscious sedation during continuous monitoring of the patient's level of consciousness and physiological / cardiorespiratory status by the radiology RN, with a total moderate sedation time of 15 minutes. TECHNIQUE: The procedure, risks, benefits, and alternatives were explained to the patient. Questions regarding the procedure were encouraged and answered. The patient understands and consents to the procedure. As antibiotic prophylaxis, cefazolin 2 g was ordered pre-procedure and administered intravenously within one hour of incision. Patency of the right IJ vein was confirmed with ultrasound with image documentation. An appropriate skin site was determined. Skin site was marked. Region was prepped using maximum barrier technique including cap and mask, sterile gown, sterile gloves, large sterile sheet, and Chlorhexidine as cutaneous antisepsis. The region was infiltrated locally with 1% lidocaine. Under real-time ultrasound guidance, the right IJ vein was accessed with a 21 gauge micropuncture needle; the needle tip within the vein was confirmed with ultrasound image documentation. Needle was exchanged over a 018 guidewire for transitional dilator which allowed passage of the [facility] wire into the IVC. Over this, the transitional dilator was exchanged for a 5 [place] MPA catheter. A small incision was made on the right anterior chest wall and a subcutaneous pocket fashioned. The power-injectable port was positioned and its catheter tunneled to the right IJ dermatotomy site. The MPA catheter was exchanged over an Amplatz wire for a peel-away sheath, through which the port catheter, which had been trimmed to the appropriate length, was advanced and positioned under fluoroscopy with its tip at the cavoatrial junction. Spot chest  radiograph confirms good catheter position and no pneumothorax. The pocket was closed with deep interrupted and subcuticular continuous 3-0  Monocryl sutures. The port was flushed per protocol. The incisions were covered with Dermabond then covered with a sterile dressing. COMPLICATIONS: COMPLICATIONS None immediate IMPRESSION: Technically successful right IJ power-injectable port catheter placement. Ready for routine use. Electronically Signed   By: Lucrezia Europe M.D.   On: 12/31/2015 10:20   Ct Biopsy  Result Date: 12/31/2015 CLINICAL DATA:  Diffuse large B-cell lymphoma, osseous metastases EXAM: CT GUIDED DEEP ILIAC BONE ASPIRATION AND CORE BIOPSY TECHNIQUE: The procedure, risks (including but not limited to bleeding, infection, organ damage ), benefits, and alternatives were explained to the patient. Questions regarding the procedure were encouraged and answered. The patient understands and consents to the procedure. Patient was placed supine on the CT gantry and limited axial scans through the pelvis were obtained. Appropriate skin entry site was identified. Skin site was marked, prepped with Betadine, draped in usual sterile fashion, and infiltrated locally with 1% lidocaine. Intravenous Fentanyl and Versed were administered as conscious sedation during continuous monitoring of the patient's level of consciousness and physiological / cardiorespiratory status by the radiology RN, with a total moderate sedation time of 9 minutes. Under CT fluoroscopic guidance a trocar bone needle was advanced into the right iliac bone just lateral to the sacroiliac joint using the OncoTrol device. Once needle tip position was confirmed, coaxial core and aspiration samples were obtained. The final sample was obtained using the guiding needle itself, which was then removed. Post procedure scans show no hematoma or fracture. Patient tolerated procedure well. COMPLICATIONS: COMPLICATIONS none IMPRESSION: 1. Technically successful CT  guided right iliac bone core and aspiration biopsy. Electronically Signed   By: Lucrezia Europe M.D.   On: 12/31/2015 10:20   Ct Maxillofacial W Contrast  Result Date: 01/11/2016 CLINICAL DATA:  Initial evaluation for acute abscess that roof with map for 3 days. Swelling at right-sided upper palate. EXAM: CT MAXILLOFACIAL WITH CONTRAST TECHNIQUE: Multidetector CT imaging of the maxillofacial structures was performed with intravenous contrast. Multiplanar CT image reconstructions were also generated. A small metallic BB was placed on the right temple in order to reliably differentiate right from left. CONTRAST:  47m ISOVUE-300 IOPAMIDOL (ISOVUE-300) INJECTION 61% COMPARISON:  None. FINDINGS: Visualized portions of the brain are within normal limits without acute abnormality. Globes and orbits within normal limits. The scattered dental disease present. There is dehiscence of the alveolar ridge along its lingual aspect at the base of the right first/second maxillary molar (Series 3, image 35). Adjacent subperiosteal abscess measures 9 x 7 x 10 mm (series 2, image 36). No other abscess at the roof of maps. No other significant inflammatory changes about the dentition, although evaluation somewhat limited due to dental amalgam. Mild-to-moderate mucosal thickening at the floor of the right maxilla sinus, with more minimal mucosal thickening at the floor left maxillary sinus. Paranasal sinuses are otherwise largely clear. Remainder of the visualized soft tissues of the face demonstrate no acute abnormality. IMPRESSION: 1. 9 x 7 x 10 mm subperiosteal abscess at the right roof of mouth, just medial to the base of the right first and second right maxillary molars. Abscesses is likely odontogenic in origin. 2. Mild maxillary sinus disease, right worse than left. Electronically Signed   By: BJeannine BogaM.D.   On: 01/11/2016 22:40   Dg Fluoro Guide Lumbar Puncture  Result Date: 01/01/2016 CLINICAL DATA:  65year old  male with Burkitt's lymphoma. Subsequent encounter. EXAM: DIAGNOSTIC LUMBAR PUNCTURE UNDER FLUOROSCOPIC GUIDANCE FLUOROSCOPY TIME:  If the device does not provide the exposure index: Fluoroscopy  Time (in minutes and seconds):  20 second Number of Acquired Images:  2 PROCEDURE: Case discussed with staff. No clinical suspicion for intracranial abnormality. Informed consent was obtained from the patient prior to the procedure, including potential complications of headache, bleeding allergy, and pain. Time-out performed. With the patient prone, the lower back was prepped with Betadine. 1% Lidocaine was used for local anesthesia. Lumbar puncture was performed at the L2-3 level using a 22 gauge needle with return of initially minimally blood tinged CSF which cleared with an opening pressure of 12 cm water. Ten ml of CSF were obtained for laboratory studies. The patient tolerated the procedure well and there were no apparent complications. IMPRESSION: L2-3 lumbar puncture with collection of 10 cc of cerebral spinal fluid sent for labs as per request. Electronically Signed   By: Genia Del M.D.   On: 01/01/2016 14:01    ASSESSMENT & PLAN:   65 year old Caucasian male with  #1 Stage IVB Burkitt's lymphoma with t(8;14) translocation This appears to be involving the sacrum causing sacral and root involvement with possible neurogenic bladder. PET CT scan as noted above shows extensive involvement with lymphoma. CSF negative for involvement. Bone marrow biopsy negative for involvement by lymphoma. Patient has completed cycle 1 of EPOCH-R and tolerated it well with no prohibitive toxicities. Grade 1 fatigue.  #2 right upper jaw odontogenic abscess resolved with clindamycin. Spontaneously drained. On clindamycin since 01/11/2016. No current active symptoms at this time. Plan -Labs today -Continue clindamycin as planned for now. Has been given a 30 day supply. -Peridex mouth washes 3 times a day for a week and  then twice daily. -If labs are stable today we'll plan to admit him for his second cycle of EPOCH-R on 01/22/2016. -Will plan to pursue intrathecal methotrexate for CNS prophylaxis from cycle 3.  #2 left sacral ala and S1 fracture. L5-S1 and S1-S2 left-sided nerve root compression. #3 neoplasm related pain Plan -Pain well controlled. -Patient is off OxyContin now. -Given refill on when necessary oxycodone for intermittent neoplasm related pain. -Senna S and MiraLAX as needed. -he was given a prescription for SI joint brace and help with pain control.  #4 Hypertension, dyslipidemia, peripheral arterial disease -Continue management as per primary care physician.  #5 ex-smoker with 80-pack-year history of smoking.  Labs today Inpatient admission for chemotherapy on 01/22/2016 Return to clinic in 3 weeks with repeat labs.  I spent 25 minutes counseling the patient face to face. The total time spent in the appointment was 30 minutes and more than 50% was on counseling and direct patient cares.    Sullivan Lone MD [place] AAHIVMS [facility] Hematology/Oncology Physician [facility] [place]  (Office):       [phone] (Work cell):  [phone] (Fax):           [phone]

## 2016-01-21 ENCOUNTER — Ambulatory Visit: Payer: PRIVATE HEALTH INSURANCE | Admitting: Hematology

## 2016-01-22 ENCOUNTER — Encounter (HOSPITAL_COMMUNITY): Payer: Self-pay

## 2016-01-22 ENCOUNTER — Inpatient Hospital Stay (HOSPITAL_COMMUNITY)
Admission: RE | Admit: 2016-01-22 | Discharge: 2016-01-26 | DRG: 847 | Disposition: A | Discharge status: Home / Self Care | Payer: PRIVATE HEALTH INSURANCE | Source: Ambulatory Visit | Attending: Hematology | Admitting: Hematology

## 2016-01-22 DIAGNOSIS — K047 Periapical abscess without sinus: Secondary | ICD-10-CM | POA: Diagnosis present

## 2016-01-22 DIAGNOSIS — G893 Neoplasm related pain (acute) (chronic): Secondary | ICD-10-CM

## 2016-01-22 DIAGNOSIS — T380X5A Adverse effect of glucocorticoids and synthetic analogues, initial encounter: Secondary | ICD-10-CM | POA: Diagnosis present

## 2016-01-22 DIAGNOSIS — Z5111 Encounter for antineoplastic chemotherapy: Principal | ICD-10-CM

## 2016-01-22 DIAGNOSIS — D72829 Elevated white blood cell count, unspecified: Secondary | ICD-10-CM | POA: Diagnosis present

## 2016-01-22 DIAGNOSIS — Z87891 Personal history of nicotine dependence: Secondary | ICD-10-CM | POA: Diagnosis not present

## 2016-01-22 DIAGNOSIS — E785 Hyperlipidemia, unspecified: Secondary | ICD-10-CM | POA: Diagnosis present

## 2016-01-22 DIAGNOSIS — I739 Peripheral vascular disease, unspecified: Secondary | ICD-10-CM | POA: Diagnosis present

## 2016-01-22 DIAGNOSIS — I10 Essential (primary) hypertension: Secondary | ICD-10-CM | POA: Diagnosis present

## 2016-01-22 DIAGNOSIS — C8378 Burkitt lymphoma, lymph nodes of multiple sites: Secondary | ICD-10-CM | POA: Diagnosis present

## 2016-01-22 DIAGNOSIS — G4700 Insomnia, unspecified: Secondary | ICD-10-CM

## 2016-01-22 LAB — CBC WITH DIFFERENTIAL/PLATELET
Basophils Absolute: 0.1 10*3/uL (ref 0.0–0.1)
Basophils Relative: 1 %
EOS ABS: 0.1 10*3/uL (ref 0.0–0.7)
EOS PCT: 2 %
HCT: 35.1 % — ABNORMAL LOW (ref 39.0–52.0)
Hemoglobin: 11.3 g/dL — ABNORMAL LOW (ref 13.0–17.0)
LYMPHS ABS: 1.5 10*3/uL (ref 0.7–4.0)
LYMPHS PCT: 21 %
MCH: 26.5 pg (ref 26.0–34.0)
MCHC: 32.2 g/dL (ref 30.0–36.0)
MCV: 82.4 fL (ref 78.0–100.0)
MONO ABS: 0.5 10*3/uL (ref 0.1–1.0)
Monocytes Relative: 7 %
Neutro Abs: 4.9 10*3/uL (ref 1.7–7.7)
Neutrophils Relative %: 69 %
Platelets: 413 10*3/uL — ABNORMAL HIGH (ref 150–400)
RBC: 4.26 MIL/uL (ref 4.22–5.81)
RDW: 15.1 % (ref 11.5–15.5)
WBC: 7.1 10*3/uL (ref 4.0–10.5)

## 2016-01-22 LAB — COMPREHENSIVE METABOLIC PANEL
ALBUMIN: 3.9 g/dL (ref 3.5–5.0)
ALT: 36 U/L (ref 17–63)
AST: 26 U/L (ref 15–41)
Alkaline Phosphatase: 104 U/L (ref 38–126)
Anion gap: 7 (ref 5–15)
BUN: 13 mg/dL (ref 6–20)
CHLORIDE: 101 mmol/L (ref 101–111)
CO2: 27 mmol/L (ref 22–32)
CREATININE: 0.63 mg/dL (ref 0.61–1.24)
Calcium: 9.3 mg/dL (ref 8.9–10.3)
GFR calc Af Amer: >60 mL/min (ref >60)
GLUCOSE: 137 mg/dL — AB (ref 65–99)
POTASSIUM: 3.7 mmol/L (ref 3.5–5.1)
Sodium: 135 mmol/L (ref 135–145)
Total Protein: 6.9 g/dL (ref 6.5–8.1)

## 2016-01-22 LAB — PHOSPHORUS: Phosphorus: 3.5 mg/dL (ref 2.5–4.6)

## 2016-01-22 LAB — MAGNESIUM: Magnesium: 1.9 mg/dL (ref 1.7–2.4)

## 2016-01-22 MED ORDER — CLINDAMYCIN HCL 300 MG PO CAPS
450.0000 mg | ORAL_CAPSULE | Freq: Three times a day (TID) | ORAL | Status: DC
  Administered 2016-01-24 – 2016-01-25 (×2): 450 mg via ORAL
  Filled 2016-01-22 (×13): qty 1

## 2016-01-22 MED ORDER — AMLODIPINE BESYLATE 10 MG PO TABS: 10.0000 mg | ORAL_TABLET | Freq: Every evening | ORAL | Status: DC

## 2016-01-22 MED ORDER — SODIUM CHLORIDE 0.9 % IV SOLN
Freq: Once | INTRAVENOUS | Status: AC
  Administered 2016-01-22: 8 mg via INTRAVENOUS
  Filled 2016-01-22: qty 4

## 2016-01-22 MED ORDER — ASPIRIN EC 81 MG PO TBEC
81.0000 mg | DELAYED_RELEASE_TABLET | Freq: Every day | ORAL | Status: DC
  Administered 2016-01-25: 81 mg via ORAL

## 2016-01-22 MED ORDER — SODIUM CHLORIDE 0.9% FLUSH: 10.0000 mL | INTRAVENOUS | Status: DC | PRN

## 2016-01-22 MED ORDER — COLD PACK MISC ONCOLOGY: 1.0000 | Freq: Once | Status: DC | PRN

## 2016-01-22 MED ORDER — HEPARIN SOD (PORK) LOCK FLUSH 100 UNIT/ML IV SOLN: 500.0000 [IU] | Freq: Once | INTRAVENOUS | Status: DC | PRN

## 2016-01-22 MED ORDER — MAGIC MOUTHWASH W/LIDOCAINE
5.0000 mL | Freq: Four times a day (QID) | ORAL | Status: DC
  Filled 2016-01-22 (×20): qty 5

## 2016-01-22 MED ORDER — POLYETHYLENE GLYCOL 3350 17 G PO PACK: 17.0000 g | PACK | Freq: Every day | ORAL | Status: DC

## 2016-01-22 MED ORDER — SENNOSIDES-DOCUSATE SODIUM 8.6-50 MG PO TABS: 2.0000 | ORAL_TABLET | Freq: Every evening | ORAL | Status: DC | PRN

## 2016-01-22 MED ORDER — PREDNISONE 50 MG PO TABS
60.0000 mg/m2/d | ORAL_TABLET | Freq: Every day | ORAL | Status: AC
  Administered 2016-01-22 – 2016-01-26 (×5): 125.5 mg via ORAL
  Filled 2016-01-22 (×7): qty 3

## 2016-01-22 MED ORDER — CLOPIDOGREL BISULFATE 75 MG PO TABS
75.0000 mg | ORAL_TABLET | Freq: Every day | ORAL | Status: DC
  Administered 2016-01-24 – 2016-01-25 (×2): 75 mg via ORAL

## 2016-01-22 MED ORDER — SODIUM CHLORIDE 0.9% FLUSH: 3.0000 mL | INTRAVENOUS | Status: DC | PRN

## 2016-01-22 MED ORDER — HEPARIN SOD (PORK) LOCK FLUSH 100 UNIT/ML IV SOLN: 250.0000 [IU] | Freq: Once | INTRAVENOUS | Status: DC | PRN

## 2016-01-22 MED ORDER — ENOXAPARIN SODIUM 40 MG/0.4ML ~~LOC~~ SOLN
40.0000 mg | SUBCUTANEOUS | Status: DC
  Administered 2016-01-22 – 2016-01-25 (×4): 40 mg via SUBCUTANEOUS
  Filled 2016-01-22 (×3): qty 0.4

## 2016-01-22 MED ORDER — VINCRISTINE SULFATE CHEMO INJECTION 1 MG/ML
Freq: Once | INTRAVENOUS | Status: AC
  Administered 2016-01-22: 13:00:00 via INTRAVENOUS
  Filled 2016-01-22: qty 10

## 2016-01-22 MED ORDER — ACETAMINOPHEN 325 MG PO TABS: 650.0000 mg | ORAL_TABLET | ORAL | Status: DC | PRN

## 2016-01-22 MED ORDER — LORAZEPAM 0.5 MG PO TABS: 0.5000 mg | ORAL_TABLET | Freq: Four times a day (QID) | ORAL | Status: DC | PRN

## 2016-01-22 MED ORDER — SODIUM CHLORIDE 0.9 % IV SOLN
INTRAVENOUS | Status: DC
  Administered 2016-01-22 – 2016-01-25 (×2): via INTRAVENOUS

## 2016-01-22 MED ORDER — LISINOPRIL 20 MG PO TABS: 20.0000 mg | ORAL_TABLET | Freq: Every evening | ORAL | Status: DC

## 2016-01-22 MED ORDER — ALTEPLASE 2 MG IJ SOLR: 2.0000 mg | Freq: Once | INTRAMUSCULAR | Status: DC | PRN

## 2016-01-22 MED ORDER — OXYCODONE HCL 5 MG PO TABS: 5.0000 mg | ORAL_TABLET | Freq: Four times a day (QID) | ORAL | Status: DC | PRN

## 2016-01-22 MED ORDER — ALUM & MAG HYDROXIDE-SIMETH 200-200-20 MG/5ML PO SUSP: 60.0000 mL | ORAL | Status: DC | PRN

## 2016-01-22 MED ORDER — CHLORHEXIDINE GLUCONATE 0.12 % MT SOLN
15.0000 mL | Freq: Three times a day (TID) | OROMUCOSAL | Status: DC
  Administered 2016-01-22: 15 mL via OROMUCOSAL

## 2016-01-22 MED ORDER — HOT PACK MISC ONCOLOGY: 1.0000 | Freq: Once | Status: DC | PRN

## 2016-01-22 NOTE — Progress Notes (Signed)
Chemo dosages and calculations checked manually with Clotilde Dieter RN, chemo certified nurse.

## 2016-01-22 NOTE — Progress Notes (Signed)
Patient using medications from home.  Nurse informed pharmacy today that the patient's wife has meds from home and insists on using them.  I discussed the rationale for our home medication policy and voiced the hospital's concern for the patient's safety.  I asked that the wife please take the patient's meds home and let pharmacy provide ordered medications.  Her main concern is what the hospital charges for medications.  Patient and wife said they would consider using hospital medications.  Of note, this same issue occurred on previous admission.  Dolly Rias RPh 01/22/2016, 1:47 PM Pager [phone]

## 2016-01-22 NOTE — H&P (Signed)
[name]    HEMATOLOGY/ONCOLOGY CONSULTATION NOTE  Date of Service: 01/22/2016  Patient Care Team: Ernestene Kiel, MD as PCP - General (Internal Medicine) [name], MD as Consulting Physician (Orthopedic Surgery)  CHIEF COMPLAINTS/PURPOSE OF CONSULTATION:  Burkitts Lymphoma admitted for 2nd cycle of EPOCH-R  HISTORY OF PRESENTING ILLNESS:   Erik Vaughn is a wonderful 65 y.o. male who was recently seen in clinic and is being admitted for his second cycle of EPOCH-R for Burkitt's lymphoma (with t(8;14) translocation).  He was seen in clinic on 01/20/2016 and his blood tests were stable with recovery of his WBC counts. He had no prohibitive toxicities from his first cycle of chemotherapy. Did have an periodontal abscess in the upper jaw which drained spontaneously as being treated with clindamycin. No other acute new symptoms since his last clinic visit. He notes that he feels well and cannot feel his left chest wall mass anymore. He notes that his energy levels are good he is ready for his next cycle of treatment.   MEDICAL HISTORY:  Past Medical History:  Diagnosis Date  . Cancer ([place])    Lymphoma  . Hypertension   . 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   He smokes about 2 packs a day for 40 years.  SURGICAL HISTORY: Past Surgical History:  Procedure Laterality Date  . IR GENERIC HISTORICAL  12/31/2015   IR FLUORO GUIDE CV LINE RIGHT 12/31/2015 Arne Cleveland, MD [facility]  . IR GENERIC HISTORICAL  12/31/2015   IR US GUIDE VASC ACCESS RIGHT 12/31/2015 Arne Cleveland, MD [facility]  . left salivary Left    removed    SOCIAL HISTORY: Social History   Social History  . Marital status: Married    Spouse name: N/A  . Number of children: N/A  . Years  of education: N/A   Occupational History  . Not on file.   Social History Main Topics  . Smoking status: Former Smoker    Packs/day: 2.00    Quit date: 12/22/2005  . Smokeless tobacco: Never Used  . Alcohol use Yes     Comment: occasionally  . Drug use: Unknown  . Sexual activity: No   Other Topics Concern  . Not on file   Social History Narrative  . No narrative on file    FAMILY HISTORY: History reviewed. No pertinent family history.  ALLERGIES:  has No Known Allergies.  MEDICATIONS:  Current Facility-Administered Medications  Medication Dose Route Frequency Provider Last Rate Last Dose  . 0.9 %  sodium chloride infusion   Intravenous Continuous Brunetta Genera, MD 20 mL/hr at 01/22/16 1206    . acetaminophen (TYLENOL) tablet 650 mg  650 mg Oral Q4H PRN Brunetta Genera, MD      . alteplase (CATHFLO ACTIVASE) injection 2 mg  2 mg Intracatheter Once PRN Brunetta Genera, MD      . alum & mag hydroxide-simeth (MAALOX/MYLANTA) 200-200-20 MG/5ML suspension 60 mL  60 mL Oral Q4H PRN Brunetta Genera, MD      . amLODipine (NORVASC) tablet 10 mg  10 mg Oral QPM Brunetta Genera, MD      . aspirin EC tablet 81 mg  81 mg Oral QHS Brunetta Genera, MD      . chlorhexidine (PERIDEX) 0.12 % solution 15 mL  15 mL Mouth/Throat TID Brunetta Genera, MD   15 mL at 01/22/16 1205  . clindamycin (CLEOCIN) capsule 450 mg  450 mg Oral TID Brunetta Genera, MD      . clopidogrel (PLAVIX) tablet 75 mg  75 mg Oral QHS Brunetta Genera, MD      . Cold Pack 1 packet  1 packet Topical Once PRN Brunetta Genera, MD      . DOXOrubicin (ADRIAMYCIN) 20 mg, etoposide (VEPESID) 104 mg, vinCRIStine (ONCOVIN) 0.8 mg in sodium chloride 0.9 % 600 mL chemo infusion   Intravenous Once Brunetta Genera, MD 28 mL/hr at 01/22/16 1258    . enoxaparin (LOVENOX) injection 40 mg  40 mg Subcutaneous Q24H Brunetta Genera, MD   40 mg at 01/22/16 1202  . heparin lock flush 100 unit/mL   500 Units Intracatheter Once PRN Brunetta Genera, MD      . heparin lock flush 100 unit/mL  250 Units Intracatheter Once PRN Brunetta Genera, MD      . Hot Pack 1 packet  1 packet Topical Once PRN Brunetta Genera, MD      . lisinopril (PRINIVIL,ZESTRIL) tablet 20 mg  20 mg Oral QPM Brunetta Genera, MD      . LORazepam (ATIVAN) tablet 0.5 mg  0.5 mg Oral Q6H PRN Brunetta Genera, MD      . magic mouthwash w/lidocaine  5 mL Oral QID Brunetta Genera, MD      . oxyCODONE (Oxy IR/ROXICODONE) immediate release tablet 5-10 mg  5-10 mg Oral Q6H PRN Brunetta Genera, MD      . polyethylene glycol (MIRALAX / GLYCOLAX) packet 17 g  17 g Oral Daily Brunetta Genera, MD      . predniSONE (DELTASONE) tablet 125.5 mg  60 mg/m2/day (Treatment Plan Recorded) Oral QAC breakfast Brunetta Genera, MD   125.5 mg at 01/22/16 1306  . senna-docusate (Senokot-S) tablet 2 tablet  2 tablet Oral QHS PRN Brunetta Genera, MD      . sodium chloride flush (NS) 0.9 % injection 10 mL  10 mL Intracatheter PRN Brunetta Genera, MD      . sodium chloride flush (NS) 0.9 % injection 3 mL  3 mL Intravenous PRN Innocence Schlotzhauer Juleen China, MD        REVIEW OF SYSTEMS:    10 Point review of Systems was done is negative except as noted above.  PHYSICAL EXAMINATION: ECOG PERFORMANCE STATUS: 1 - Symptomatic but completely ambulatory  . Vitals:   01/22/16 0854 01/22/16 1326  BP: 139/84 133/79  Pulse: 95 94  Resp: 15 15  Temp: 97.5 F (36.4 C) 98 F (36.7 C)   Filed Weights   01/22/16 0854  Weight: 186 lb (84.4 kg)   .Body mass index is 25.23 kg/m.  GENERAL:alert, in no acute distress and comfortable SKIN: skin color, texture, turgor are normal, no rashes or significant lesions EYES: normal, conjunctiva are pink and non-injected, sclera clear OROPHARYNX:no exudate, no erythema and lips, buccal mucosa, and tongue normal  NECK: supple, no JVD, thyroid normal size, non-tender, without  nodularity LYMPH:  no palpable lymphadenopathy in the cervical, axillary or inguinal LUNGS: clear to auscultation with normal respiratory effort, large left anterior chest wall mass Not readily palpable any more. HEART: regular rate & rhythm,  no murmurs and no lower extremity edema ABDOMEN: abdomen soft, non-tender, normoactive bowel sounds  Musculoskeletal: no cyanosis of digits and no clubbing  PSYCH: alert & oriented x 3 with fluent speech NEURO: no focal motor/sensory deficits  LABORATORY DATA:  I have reviewed the data as listed  . CBC Latest Ref Rng & Units 01/22/2016 01/20/2016 01/11/2016  WBC 4.0 - 10.5 K/uL 7.1 8.5 4.3  Hemoglobin 13.0 - 17.0 g/dL 11.3(L) 11.2(L) 11.7(L)  Hematocrit 39.0 - 52.0 % 35.1(L) 33.8(L) 36.4(L)  Platelets 150 - 400 K/uL 413(H) 276 240    . CMP Latest Ref Rng & Units 01/22/2016 01/20/2016 01/11/2016  Glucose 65 - 99 mg/dL 137(H) 105 127(H)  BUN 6 - 20 mg/dL 13 15.8 16  Creatinine 0.61 - 1.24 mg/dL 0.63 0.7 0.65  Sodium 135 - 145 mmol/L 135 138 135  Potassium 3.5 - 5.1 mmol/L 3.7 4.5 4.2  Chloride 101 - 111 mmol/L 101 - 96(L)  CO2 22 - 32 mmol/L 27 26 30   Calcium 8.9 - 10.3 mg/dL 9.3 9.6 8.9  Total Protein 6.5 - 8.1 g/dL 6.9 6.6 -  Total Bilirubin 0.3 - 1.2 mg/dL <0.1(L) <0.30 -  Alkaline Phos 38 - 126 U/L 104 114 -  AST 15 - 41 U/L 26 20 -  ALT 17 - 63 U/L 36 34 -   . Lab Results  Component Value Date   LDH 196 01/20/2016    RADIOGRAPHIC STUDIES: I have personally reviewed the radiological images as listed and agreed with the findings in the report. Nm Pet Image Initial (pi) Skull Base To Thigh  Result Date: 12/25/2015 CLINICAL DATA:  Initial treatment strategy for diffuse large B-cell lymphoma with osseous metastases. EXAM: NUCLEAR MEDICINE PET SKULL BASE TO THIGH TECHNIQUE: 9.29 mCi F-18 FDG was injected intravenously. Full-ring PET imaging was performed from the skull base to thigh after the radiotracer. CT data was obtained and used for  attenuation correction and anatomic localization. FASTING BLOOD GLUCOSE:  Value: 123 mg/dl COMPARISON:  None. FINDINGS: NECK No hypermetabolic lymph nodes in the neck. CHEST 13 x 11 mm posterior right upper lobe/ right perihilar nodule (series 8/ image 25), max SUV 18.4, suspicious for pulmonary metastasis. Trace left pleural effusion. 2.3 x 1.9 cm left paratracheal node (series 4/ image 57), max SUV 36.8. Additional 8 mm short axis left infrahilar node (series 4/ image 80), max SUV 13.0. Additional small mediastinal lymph nodes, although without appreciable hypermetabolism. Three vessel coronary atherosclerosis. Mild atherosclerotic calcifications of the abdominal aorta. ABDOMEN/PELVIS No abnormal hypermetabolic activity within the liver, pancreas, adrenal glands, or spleen. 7.1 x 10.6 cm aggregate nodal mass in the right mid abdomen (series 4/ image 147), max SUV 41.5. 6.4 x 5.3 cm left external iliac nodal mass (series 4/image 137), max SUV 38.7. Additional foci of hypermetabolism along the right anterior abdominal wall and right abdominal mesentery. Prostatomegaly, with enlargement of the central gland which indents the base of the bladder. Mildly thick-walled bladder. Atherosclerotic calcifications the abdominal aorta and branch vessels. SKELETON 4.9 x 6.8 cm left anterior chest wall metastasis involving the right anterior 2nd and 3rd rib (series 4/ image 80), max SUV 37.2. 4.8 x 5.9 cm left posterior chest wall metastasis (series 4/ image 91), with associated pathologic fracture of the left posterior 9th rib, max SUV 48.6. Left sacral metastasis (series 4/ image 163), max SUV 31.1. Additional hypermetabolism involving the left acetabulum and iliac bone. Right proximal humeral metastasis, max SUV 39.9. IMPRESSION: Widespread lymphoma in the chest, abdomen, and pelvis, as above. 13 mm posterior right upper lobe pulmonary nodule, suspicious for pulmonary metastasis. Upper thoracic and left perihilar nodal  lymphoma.  Large nodal masses in the right mid abdomen and left pelvis. Osseous metastases involving the left anterior and left posterior chest wall, with pathologic fracture of the left posterior 9th rib. Additional osseous metastases involving the left sacral and right proximal humerus. Additional ancillary findings as above. Electronically Signed   By: Julian Hy M.D.   On: 12/25/2015 10:53  US Biopsy  Result Date: 12/23/2015 CLINICAL DATA:  Large mass of the left chest wall as well as destructive lesion of the left ninth rib and sacral mass. The patient presents for biopsy of the chest wall mass. EXAM: ULTRASOUND GUIDED CORE BIOPSY OF LEFT CHEST WALL MASS MEDICATIONS: 1.0 mg IV Versed; 50 mcg IV Fentanyl Total Moderate Sedation Time: 9 minutes. The patient's level of consciousness and physiologic status were continuously monitored during the procedure by Radiology nursing. PROCEDURE: The procedure, risks, benefits, and alternatives were explained to the patient. Questions regarding the procedure were encouraged and answered. The patient understands and consents to the procedure. A time-out was performed prior to initiating the procedure. The left chest wall was prepped with chlorhexidine in a sterile fashion, and a sterile drape was applied covering the operative field. A sterile gown and sterile gloves were used for the procedure. Local anesthesia was provided with 1% Lidocaine. A left chest wall mass was localized by ultrasound. Seven separate 16 gauge core biopsy samples were obtained and submitted in both saline and formalin. Post biopsy imaging was performed with ultrasound. COMPLICATIONS: None. FINDINGS: Large hypoechoic, solid oval shaped left chest wall mass is identified in the left pectoral region. Solid tissue was obtained. IMPRESSION: Ultrasound-guided core biopsy performed of a large left chest wall mass. Electronically Signed   By: Aletta Edouard M.D.   On: 12/23/2015 16:50  Ir Fluoro  Guide Cv Line Right  Result Date: 12/31/2015 CLINICAL DATA:  Diffuse large B-cell lymphoma, needs durable venous access for chemotherapy EXAM: TUNNELED PORT CATHETER PLACEMENT WITH ULTRASOUND AND FLUOROSCOPIC GUIDANCE FLUOROSCOPY TIME:  6 seconds, 2 mGy ANESTHESIA/SEDATION: Intravenous Fentanyl and Versed were administered as conscious sedation during continuous monitoring of the patient's level of consciousness and physiological / cardiorespiratory status by the radiology RN, with a total moderate sedation time of 15 minutes. TECHNIQUE: The procedure, risks, benefits, and alternatives were explained to the patient. Questions regarding the procedure were encouraged and answered. The patient understands and consents to the procedure. As antibiotic prophylaxis, cefazolin 2 g was ordered pre-procedure and administered intravenously within one hour of incision. Patency of the right IJ vein was confirmed with ultrasound with image documentation. An appropriate skin site was determined. Skin site was marked. Region was prepped using maximum barrier technique including cap and mask, sterile gown, sterile gloves, large sterile sheet, and Chlorhexidine as cutaneous antisepsis. The region was infiltrated locally with 1% lidocaine. Under real-time ultrasound guidance, the right IJ vein was accessed with a 21 gauge micropuncture needle; the needle tip within the vein was confirmed with ultrasound image documentation. Needle was exchanged over a 018 guidewire for transitional dilator which allowed passage of the [facility] wire into the IVC. Over this, the transitional dilator was exchanged for a 5 [place] MPA catheter. A small incision was made on the right anterior chest wall and a subcutaneous pocket fashioned. The power-injectable port was positioned and its catheter tunneled to the right IJ dermatotomy site. The MPA catheter was exchanged over an Amplatz wire for a peel-away sheath, through which the port catheter, which had  been trimmed to the appropriate length, was advanced  and positioned under fluoroscopy with its tip at the cavoatrial junction. Spot chest radiograph confirms good catheter position and no pneumothorax. The pocket was closed with deep interrupted and subcuticular continuous 3-0 Monocryl sutures. The port was flushed per protocol. The incisions were covered with Dermabond then covered with a sterile dressing. COMPLICATIONS: COMPLICATIONS None immediate IMPRESSION: Technically successful right IJ power-injectable port catheter placement. Ready for routine use. Electronically Signed   By: Lucrezia Europe M.D.   On: 12/31/2015 10:20   Ir US Guide Vasc Access Right  Result Date: 12/31/2015 CLINICAL DATA:  Diffuse large B-cell lymphoma, needs durable venous access for chemotherapy EXAM: TUNNELED PORT CATHETER PLACEMENT WITH ULTRASOUND AND FLUOROSCOPIC GUIDANCE FLUOROSCOPY TIME:  6 seconds, 2 mGy ANESTHESIA/SEDATION: Intravenous Fentanyl and Versed were administered as conscious sedation during continuous monitoring of the patient's level of consciousness and physiological / cardiorespiratory status by the radiology RN, with a total moderate sedation time of 15 minutes. TECHNIQUE: The procedure, risks, benefits, and alternatives were explained to the patient. Questions regarding the procedure were encouraged and answered. The patient understands and consents to the procedure. As antibiotic prophylaxis, cefazolin 2 g was ordered pre-procedure and administered intravenously within one hour of incision. Patency of the right IJ vein was confirmed with ultrasound with image documentation. An appropriate skin site was determined. Skin site was marked. Region was prepped using maximum barrier technique including cap and mask, sterile gown, sterile gloves, large sterile sheet, and Chlorhexidine as cutaneous antisepsis. The region was infiltrated locally with 1% lidocaine. Under real-time ultrasound guidance, the right IJ vein was  accessed with a 21 gauge micropuncture needle; the needle tip within the vein was confirmed with ultrasound image documentation. Needle was exchanged over a 018 guidewire for transitional dilator which allowed passage of the [facility] wire into the IVC. Over this, the transitional dilator was exchanged for a 5 [place] MPA catheter. A small incision was made on the right anterior chest wall and a subcutaneous pocket fashioned. The power-injectable port was positioned and its catheter tunneled to the right IJ dermatotomy site. The MPA catheter was exchanged over an Amplatz wire for a peel-away sheath, through which the port catheter, which had been trimmed to the appropriate length, was advanced and positioned under fluoroscopy with its tip at the cavoatrial junction. Spot chest radiograph confirms good catheter position and no pneumothorax. The pocket was closed with deep interrupted and subcuticular continuous 3-0 Monocryl sutures. The port was flushed per protocol. The incisions were covered with Dermabond then covered with a sterile dressing. COMPLICATIONS: COMPLICATIONS None immediate IMPRESSION: Technically successful right IJ power-injectable port catheter placement. Ready for routine use. Electronically Signed   By: Lucrezia Europe M.D.   On: 12/31/2015 10:20   Ct Biopsy  Result Date: 12/31/2015 CLINICAL DATA:  Diffuse large B-cell lymphoma, osseous metastases EXAM: CT GUIDED DEEP ILIAC BONE ASPIRATION AND CORE BIOPSY TECHNIQUE: The procedure, risks (including but not limited to bleeding, infection, organ damage ), benefits, and alternatives were explained to the patient. Questions regarding the procedure were encouraged and answered. The patient understands and consents to the procedure. Patient was placed supine on the CT gantry and limited axial scans through the pelvis were obtained. Appropriate skin entry site was identified. Skin site was marked, prepped with Betadine, draped in usual sterile fashion, and  infiltrated locally with 1% lidocaine. Intravenous Fentanyl and Versed were administered as conscious sedation during continuous monitoring of the patient's level of consciousness and physiological / cardiorespiratory status by the radiology RN, with  a total moderate sedation time of 9 minutes. Under CT fluoroscopic guidance a trocar bone needle was advanced into the right iliac bone just lateral to the sacroiliac joint using the OncoTrol device. Once needle tip position was confirmed, coaxial core and aspiration samples were obtained. The final sample was obtained using the guiding needle itself, which was then removed. Post procedure scans show no hematoma or fracture. Patient tolerated procedure well. COMPLICATIONS: COMPLICATIONS none IMPRESSION: 1. Technically successful CT guided right iliac bone core and aspiration biopsy. Electronically Signed   By: Lucrezia Europe M.D.   On: 12/31/2015 10:20   Ct Maxillofacial W Contrast  Result Date: 01/11/2016 CLINICAL DATA:  Initial evaluation for acute abscess that roof with map for 3 days. Swelling at right-sided upper palate. EXAM: CT MAXILLOFACIAL WITH CONTRAST TECHNIQUE: Multidetector CT imaging of the maxillofacial structures was performed with intravenous contrast. Multiplanar CT image reconstructions were also generated. A small metallic BB was placed on the right temple in order to reliably differentiate right from left. CONTRAST:  71m ISOVUE-300 IOPAMIDOL (ISOVUE-300) INJECTION 61% COMPARISON:  None. FINDINGS: Visualized portions of the brain are within normal limits without acute abnormality. Globes and orbits within normal limits. The scattered dental disease present. There is dehiscence of the alveolar ridge along its lingual aspect at the base of the right first/second maxillary molar (Series 3, image 35). Adjacent subperiosteal abscess measures 9 x 7 x 10 mm (series 2, image 36). No other abscess at the roof of maps. No other significant inflammatory  changes about the dentition, although evaluation somewhat limited due to dental amalgam. Mild-to-moderate mucosal thickening at the floor of the right maxilla sinus, with more minimal mucosal thickening at the floor left maxillary sinus. Paranasal sinuses are otherwise largely clear. Remainder of the visualized soft tissues of the face demonstrate no acute abnormality. IMPRESSION: 1. 9 x 7 x 10 mm subperiosteal abscess at the right roof of mouth, just medial to the base of the right first and second right maxillary molars. Abscesses is likely odontogenic in origin. 2. Mild maxillary sinus disease, right worse than left. Electronically Signed   By: BJeannine BogaM.D.   On: 01/11/2016 22:40   Dg Fluoro Guide Lumbar Puncture  Result Date: 01/01/2016 CLINICAL DATA:  65year old male with Burkitt's lymphoma. Subsequent encounter. EXAM: DIAGNOSTIC LUMBAR PUNCTURE UNDER FLUOROSCOPIC GUIDANCE FLUOROSCOPY TIME:  If the device does not provide the exposure index: Fluoroscopy Time (in minutes and seconds):  20 second Number of Acquired Images:  2 PROCEDURE: Case discussed with staff. No clinical suspicion for intracranial abnormality. Informed consent was obtained from the patient prior to the procedure, including potential complications of headache, bleeding allergy, and pain. Time-out performed. With the patient prone, the lower back was prepped with Betadine. 1% Lidocaine was used for local anesthesia. Lumbar puncture was performed at the L2-3 level using a 22 gauge needle with return of initially minimally blood tinged CSF which cleared with an opening pressure of 12 cm water. Ten ml of CSF were obtained for laboratory studies. The patient tolerated the procedure well and there were no apparent complications. IMPRESSION: L2-3 lumbar puncture with collection of 10 cc of cerebral spinal fluid sent for labs as per request. Electronically Signed   By: SGenia DelM.D.   On: 01/01/2016 14:01    ASSESSMENT & PLAN:    65yo with   #1 Stage IVB Burkitt's lymphoma with t(8;14) translocation This appears to be involving the sacrum causing sacral and root involvement with possible  neurogenic bladder (patient has had significant improvement and control of his bladder) PET CT scan as noted above shows extensive involvement with lymphoma. CSF negative for involvement. Bone marrow biopsy negative for involvement by lymphoma. Patient has completed cycle 1 of EPOCH-R and tolerated it well with no prohibitive toxicities. Grade 1 fatigue.  #2 right upper jaw odontogenic abscess resolved with clindamycin. Spontaneously drained. On clindamycin since 01/11/2016. No current active symptoms at this time.  #3 left sacral ala and S1 fracture. L5-S1 and S1-S2 left-sided nerve root compression. #4 neoplasm related pain - well controlled and the patient is off OxyContin at this time and only using when necessary oxycodone. Plan -Labs are stable and patient appropriate for proceeding with his second cycle of EPOCH-R (Rituxan on day 5) with similar supportive medications. -Continue clindamycin as per plan for 3-4 weeks for his subperiosteal odontogenic abscess in the rt roof of the mouth. -When necessary oxycodone for pain -Ambulate as possible. -Lovenox for DVT prophylaxis.  #4 Hypertension, dyslipidemia, peripheral arterial disease -Continue outpatient medications. -Continue aspirin plus Plavix.  #5 ex-smoker with 80-pack-year history of smoking.  The total time spent in the appointment was 60 minutes and more than 50% was on counseling and direct patient cares and co-ordination of care with nursing and pharmacy.    Sullivan Lone MD [place] AAHIVMS [facility] Hematology/Oncology Physician [facility]  (Office):       [phone] (Work cell):  [phone] (Fax):           [phone]

## 2016-01-23 LAB — CBC
HEMATOCRIT: 32.4 % — AB (ref 39.0–52.0)
HEMOGLOBIN: 10.8 g/dL — AB (ref 13.0–17.0)
MCH: 26.7 pg (ref 26.0–34.0)
MCHC: 33.3 g/dL (ref 30.0–36.0)
MCV: 80.2 fL (ref 78.0–100.0)
Platelets: 459 10*3/uL — ABNORMAL HIGH (ref 150–400)
RBC: 4.04 MIL/uL — ABNORMAL LOW (ref 4.22–5.81)
RDW: 14.8 % (ref 11.5–15.5)
WBC: 17.9 10*3/uL — AB (ref 4.0–10.5)

## 2016-01-23 LAB — BASIC METABOLIC PANEL
ANION GAP: 5 (ref 5–15)
BUN: 14 mg/dL (ref 6–20)
CALCIUM: 9.5 mg/dL (ref 8.9–10.3)
CHLORIDE: 105 mmol/L (ref 101–111)
CO2: 27 mmol/L (ref 22–32)
Creatinine, Ser: 0.64 mg/dL (ref 0.61–1.24)
GFR calc non Af Amer: >60 mL/min (ref >60)
GLUCOSE: 145 mg/dL — AB (ref 65–99)
Potassium: 4.5 mmol/L (ref 3.5–5.1)
Sodium: 137 mmol/L (ref 135–145)

## 2016-01-23 MED ORDER — SODIUM CHLORIDE 0.9 % IV SOLN
Freq: Once | INTRAVENOUS | Status: AC
  Administered 2016-01-23: 8 mg via INTRAVENOUS
  Filled 2016-01-23: qty 4

## 2016-01-23 MED ORDER — VINCRISTINE SULFATE CHEMO INJECTION 1 MG/ML
Freq: Once | INTRAVENOUS | Status: AC
  Administered 2016-01-23: 12:00:00 via INTRAVENOUS
  Filled 2016-01-23: qty 10

## 2016-01-23 MED ORDER — LORAZEPAM 0.5 MG PO TABS: 0.5000 mg | ORAL_TABLET | Freq: Every evening | ORAL | Status: DC | PRN

## 2016-01-23 NOTE — Progress Notes (Signed)
Pt overall had great night. Complain of no pain, but did not sleep. Pt requested that for Friday night he would like to receive medication to help sleep. Also, pt did not have labs drawn. Lab was notified twice on why floor could not draw labs from pt port.  Neil Crouch, RN

## 2016-01-23 NOTE — Progress Notes (Signed)
.   HEMATOLOGY/ONCOLOGY INPATIENT PROGRESS NOTE  Date of Service: 01/23/2016  Inpatient Attending: . Kishore , MD   SUBJECTIVE [name] was seen this morning and notes that he had a good day and night without any acute symptoms. Did not sleep very well at night due to frequent urination and likely from his high-dose steroids and is requesting some sleep aids. Lorazepam ordered. No nausea. No vomiting. No chest pain or shortness of breath no abdominal pain. In good spirits.  No other acute new symptoms.   OBJECTIVE:  NAD  PHYSICAL EXAMINATION: . Vitals:   01/22/16 0854 01/22/16 1326 01/22/16 2042 01/23/16 0520  BP: 139/84 133/79 124/70 (!) 145/77  Pulse: 95 94 (!) 101 (!) 103  Resp: 15 15 14 16  Temp: 97.5 F (36.4 C) 98 F (36.7 C) 97.5 F (36.4 C) 97.9 F (36.6 C)  TempSrc: Oral Oral Oral Oral  SpO2: 98% 98% 96% 97%  Weight: 186 lb (84.4 kg)   186 lb 3.2 oz (84.5 kg)  Height: 6' (1.829 m)      Filed Weights   01/22/16 0854 01/23/16 0520  Weight: 186 lb (84.4 kg) 186 lb 3.2 oz (84.5 kg)   .Body mass index is 25.25 kg/m.  GENERAL:alert, in no acute distress and comfortable SKIN: skin color, texture, turgor are normal, no rashes or significant lesions EYES: normal, conjunctiva are pink and non-injected, sclera clear OROPHARYNX:no exudate, no erythema and lips, buccal mucosa, and tongue normal  NECK: supple, no JVD, thyroid normal size, non-tender, without nodularity LYMPH:  no palpable lymphadenopathy in the cervical, axillary or inguinal LUNGS: clear to auscultation with normal respiratory effort HEART: regular rate & rhythm,  no murmurs and no lower extremity edema ABDOMEN: abdomen soft, non-tender, normoactive bowel sounds  Musculoskeletal: no cyanosis of digits and no clubbing  PSYCH: alert & oriented x 3 with fluent speech NEURO: no focal motor/sensory deficits  MEDICAL HISTORY:  Past Medical History:  Diagnosis Date  . Cancer (HCC)    Lymphoma  . Hypertension   . PAD (peripheral artery disease) (HCC) left leg    SURGICAL HISTORY: Past Surgical History:  Procedure Laterality Date  . IR GENERIC HISTORICAL  12/31/2015   IR FLUORO GUIDE CV LINE RIGHT 12/31/2015 Daniel Hassell, MD [facility]  . IR GENERIC HISTORICAL  12/31/2015   IR US GUIDE VASC ACCESS RIGHT 12/31/2015 Daniel Hassell, MD [facility]  . left salivary Left    removed    SOCIAL HISTORY: Social History   Social History  . Marital status: Married    Spouse name: N/A  . Number of children: N/A  . Years of education: N/A   Occupational History  . Not on file.   Social History Main Topics  . Smoking status: Former Smoker    Packs/day: 2.00    Quit date: 12/22/2005  . Smokeless tobacco: Never Used  . Alcohol use Yes     Comment: occasionally  . Drug use: Unknown  . Sexual activity: No   Other Topics Concern  . Not on file   Social History Narrative  . No narrative on file    FAMILY HISTORY: History reviewed. No pertinent family history.  ALLERGIES:  has No Known Allergies.  MEDICATIONS:  Scheduled Meds: . amLODipine  10 mg Oral QPM  . aspirin EC  81 mg Oral QHS  . chlorhexidine  15 mL Mouth/Throat TID  . clindamycin  450 mg Oral TID  . clopidogrel  75 mg Oral QHS  .   DOXOrubicin/vinCRIStine/etoposide CHEMO IV infusion for Inpatient CI   Intravenous Once  . DOXOrubicin/vinCRIStine/etoposide CHEMO IV infusion for Inpatient CI   Intravenous Once  . enoxaparin (LOVENOX) injection  40 mg Subcutaneous Q24H  . lisinopril  20 mg Oral QPM  . magic mouthwash w/lidocaine  5 mL Oral QID  . ondansetron (ZOFRAN) with dexamethasone (DECADRON) IV   Intravenous Once  . polyethylene glycol  17 g Oral Daily  . predniSONE  60 mg/m2/day (Treatment Plan Recorded) Oral QAC breakfast   Continuous Infusions: . sodium chloride 20 mL/hr at 01/22/16 1206   PRN Meds:.acetaminophen, alteplase, alum & mag hydroxide-simeth, Cold Pack, heparin lock flush,  heparin lock flush, Hot Pack, LORazepam, LORazepam, oxyCODONE, senna-docusate, sodium chloride flush, sodium chloride flush  REVIEW OF SYSTEMS:    10 Point review of Systems was done is negative except as noted above.   LABORATORY DATA:  I have reviewed the data as listed  . CBC Latest Ref Rng & Units 01/23/2016 01/22/2016 01/20/2016  WBC 4.0 - 10.5 K/uL 17.9(H) 7.1 8.5  Hemoglobin 13.0 - 17.0 g/dL 10.8(L) 11.3(L) 11.2(L)  Hematocrit 39.0 - 52.0 % 32.4(L) 35.1(L) 33.8(L)  Platelets 150 - 400 K/uL 459(H) 413(H) 276    . CMP Latest Ref Rng & Units 01/22/2016 01/20/2016 01/11/2016  Glucose 65 - 99 mg/dL 137(H) 105 127(H)  BUN 6 - 20 mg/dL 13 15.8 16  Creatinine 0.61 - 1.24 mg/dL 0.63 0.7 0.65  Sodium 135 - 145 mmol/L 135 138 135  Potassium 3.5 - 5.1 mmol/L 3.7 4.5 4.2  Chloride 101 - 111 mmol/L 101 - 96(L)  CO2 22 - 32 mmol/L 27 26 30  Calcium 8.9 - 10.3 mg/dL 9.3 9.6 8.9  Total Protein 6.5 - 8.1 g/dL 6.9 6.6 -  Total Bilirubin 0.3 - 1.2 mg/dL <0.1(L) <0.30 -  Alkaline Phos 38 - 126 U/L 104 114 -  AST 15 - 41 U/L 26 20 -  ALT 17 - 63 U/L 36 34 -    ASSESSMENT & PLAN:  64 yo with   #1 Stage IVB Burkitt's lymphoma with t(8;14) translocation This appears to be involving the sacrum causing sacral and root involvement with possible neurogenic bladder (patient has had significant improvement and control of his bladder) PET CT scan as noted above shows extensive involvement with lymphoma. CSF negative for involvement. Bone marrow biopsy negative for involvement by lymphoma. Patient has completed cycle 1 of EPOCH-R and tolerated it well with no prohibitive toxicities. Grade 1 fatigue.  #2 right upper jaw odontogenic abscess resolved with clindamycin.Spontaneously drained. On clindamycin since 01/11/2016.No current active symptoms at this time.  #3 left sacral ala and S1 fracture. L5-S1 and S1-S2 left-sided nerve root compression. #4 neoplasm related pain - well controlled and the  patient is off OxyContin at this time and only using when necessary oxycodone. #5 Leucocytosis due to steroids. No fevers/chills. Plan -Labs are stable and patient appropriate for proceeding with C2D2 of EPOCH-R (Rituxan planned on day 5) with similar supportive medications. -Continue clindamycin as per plan for 3-4 weeks for his subperiosteal odontogenic abscess in the rt roof of the mouth. -When necessary oxycodone for pain -Ambulate as possible. -Lovenox for DVT prophylaxis. -we shall continue to follow him daily.  #4 Hypertension, dyslipidemia, peripheral arterial disease -Continue outpatient medications. -Continue aspirin plus Plavix.     MD MS AAHIVMS [facility] Hematology/Oncology Physician [facility]  (Office):       [phone] (Work cell):  [phone] (Fax):             [phone]  01/23/2016 9:00 AM

## 2016-01-23 NOTE — Progress Notes (Signed)
Dosages, dilutions of Doxyrubicin, Etoposide and Vincristine verified with 2nd chemo competent RN Reyne Dumas.

## 2016-01-24 DIAGNOSIS — Z5111 Encounter for antineoplastic chemotherapy: Principal | ICD-10-CM

## 2016-01-24 LAB — CBC
HEMATOCRIT: 29.7 % — AB (ref 39.0–52.0)
HEMOGLOBIN: 9.9 g/dL — AB (ref 13.0–17.0)
MCH: 27 pg (ref 26.0–34.0)
MCHC: 33.3 g/dL (ref 30.0–36.0)
MCV: 80.9 fL (ref 78.0–100.0)
Platelets: 425 10*3/uL — ABNORMAL HIGH (ref 150–400)
RBC: 3.67 MIL/uL — AB (ref 4.22–5.81)
RDW: 15.1 % (ref 11.5–15.5)
WBC: 21.7 10*3/uL — ABNORMAL HIGH (ref 4.0–10.5)

## 2016-01-24 LAB — BASIC METABOLIC PANEL
ANION GAP: 6 (ref 5–15)
BUN: 19 mg/dL (ref 6–20)
CALCIUM: 9.2 mg/dL (ref 8.9–10.3)
CO2: 28 mmol/L (ref 22–32)
Chloride: 105 mmol/L (ref 101–111)
Creatinine, Ser: 0.6 mg/dL — ABNORMAL LOW (ref 0.61–1.24)
GFR calc non Af Amer: >60 mL/min (ref >60)
GLUCOSE: 151 mg/dL — AB (ref 65–99)
Potassium: 4.4 mmol/L (ref 3.5–5.1)
Sodium: 139 mmol/L (ref 135–145)

## 2016-01-24 MED ORDER — ONDANSETRON HCL 40 MG/20ML IJ SOLN
Freq: Once | INTRAMUSCULAR | Status: AC
  Administered 2016-01-24: 8 mg via INTRAVENOUS
  Filled 2016-01-24: qty 4

## 2016-01-24 MED ORDER — VINCRISTINE SULFATE CHEMO INJECTION 1 MG/ML
Freq: Once | INTRAVENOUS | Status: AC
  Administered 2016-01-24: 12:00:00 via INTRAVENOUS
  Filled 2016-01-24: qty 10

## 2016-01-24 NOTE — Progress Notes (Signed)
Erik Vaughn is doing great. He's had no problems with chemotherapy so far. He's had no nausea or vomiting. He is eating well. He's had no cough. He's had no diarrhea. He is using mouth rinse to help prevent mouth sores.  He's had no fever. He's had no bleeding or bruising.  His physical exam shows vital signs be an temperature 97.9. Also 91. Blood pressure 140/79. Head and neck exam shows no oral lesions. He has no adenopathy in the neck area and lungs are clear bilaterally. Cardiac exam regular in rhythm with no murmurs, rubs or bruits. Abdomen is soft. Has good bowel sounds. There is no fluid wave. There is no palpable liver or spleen tip. Back exam shows no tenderness over the spine, ribs or hips. Externally shows no clubbing, cyanosis or edema. Neurological exam shows no focal neurological deficits. Skin exam shows no rashes, ecchymoses or petechia.  His labs today show White cell count 21.7. Hemoglobin 9.9. Platelet count 40 25,000. Potassium is 4.4. Creatinine is 0.6. Calcium is 9.2.  Erik Vaughn is a 65 year old male with Burkitt's lymphoma. This is second cycle of chemotherapy. He is doing great. He looks good. He's had no complications from  treatment to date.  We will continue to follow him over the weekend. His labs look good. He looks great. He feels good.  There is nice that we had good fellowship. He definitely has a strong faith.  As always, the nursing staff on [address] is doing a tremendous job with him.  Lattie Haw, MD  [name] 41:10

## 2016-01-25 LAB — CBC
HCT: 33.5 % — ABNORMAL LOW (ref 39.0–52.0)
HEMOGLOBIN: 11.1 g/dL — AB (ref 13.0–17.0)
MCH: 26.8 pg (ref 26.0–34.0)
MCHC: 33.1 g/dL (ref 30.0–36.0)
MCV: 80.9 fL (ref 78.0–100.0)
Platelets: 509 10*3/uL — ABNORMAL HIGH (ref 150–400)
RBC: 4.14 MIL/uL — AB (ref 4.22–5.81)
RDW: 15.4 % (ref 11.5–15.5)
WBC: 15.8 10*3/uL — AB (ref 4.0–10.5)

## 2016-01-25 LAB — BASIC METABOLIC PANEL
ANION GAP: 8 (ref 5–15)
BUN: 17 mg/dL (ref 6–20)
CHLORIDE: 101 mmol/L (ref 101–111)
CO2: 29 mmol/L (ref 22–32)
Calcium: 9.4 mg/dL (ref 8.9–10.3)
Creatinine, Ser: 0.61 mg/dL (ref 0.61–1.24)
Glucose, Bld: 115 mg/dL — ABNORMAL HIGH (ref 65–99)
POTASSIUM: 4 mmol/L (ref 3.5–5.1)
SODIUM: 138 mmol/L (ref 135–145)

## 2016-01-25 MED ORDER — VITAMINS A & D EX OINT
TOPICAL_OINTMENT | CUTANEOUS | Status: AC
  Administered 2016-01-25: 5
  Filled 2016-01-25: qty 5

## 2016-01-25 MED ORDER — VINCRISTINE SULFATE CHEMO INJECTION 1 MG/ML
Freq: Once | INTRAVENOUS | Status: AC
  Administered 2016-01-25: 11:00:00 via INTRAVENOUS
  Filled 2016-01-25: qty 10

## 2016-01-25 MED ORDER — SODIUM CHLORIDE 0.9 % IV SOLN
Freq: Once | INTRAVENOUS | Status: AC
  Administered 2016-01-25: 8 mg via INTRAVENOUS
  Filled 2016-01-25: qty 4

## 2016-01-25 NOTE — Progress Notes (Signed)
Chemotherapy verified with Nancy Marus, RN based on BSA and normal dosing.  Independently checked by both chemo nurses.

## 2016-01-25 NOTE — Progress Notes (Signed)
Erik Vaughn looks quite good. He is doing well with chemotherapy. So far, he's had no, occasions from chemotherapy. He's had no nausea or vomiting. He's had no diarrhea. He's had no cough. He's had no abdominal pain. He's had no leg swelling.  He is ambulating without any difficulty.  His labs today look very stable. White cell count 15.8. Platelet count 509,000. Hemoglobin 11.1. His creatinine is 0.61.  On his physical exam, his blood pressure 153/74. Temperature 98.3. Pulse is 79. Head and neck exam shows no adenopathy. There is no oral lesions. He has no mucositis. Lungs are clear bilaterally. Cardiac exam regular rate and rhythm with no murmurs, rubs or bruits. Abdomen is soft. Has good bowel sounds. There is no fluid wave. There is no palpable liver or spleen tip. Back exam shows no tenderness over the spine, ribs or hips. Extremities shows no clubbing, cyanosis or edema. Skin exam shows no rashes. Neurological exam shows no focal neurological deficits.  Erik Vaughn is a 65 year old white male with Burkitt's lymphoma. He is on his second cycle of chemotherapy. He is tolerating this well. He's responded very nicely for I can tell.  He will continue his chemotherapy. Hopefully, he will finish up tomorrow.  As always, the staff on [facility] are doing a great job with him.  His faith is very strong. I talked to he and his wife about this. He is very appreciative that we could have Fellowship together.  Erik Haw, MD  Psalm 56:4

## 2016-01-26 ENCOUNTER — Telehealth: Payer: Self-pay | Admitting: Hematology

## 2016-01-26 ENCOUNTER — Other Ambulatory Visit: Payer: Self-pay | Admitting: Hematology

## 2016-01-26 LAB — CBC
HEMATOCRIT: 31.6 % — AB (ref 39.0–52.0)
HEMOGLOBIN: 10.6 g/dL — AB (ref 13.0–17.0)
MCH: 27.3 pg (ref 26.0–34.0)
MCHC: 33.5 g/dL (ref 30.0–36.0)
MCV: 81.4 fL (ref 78.0–100.0)
Platelets: 453 10*3/uL — ABNORMAL HIGH (ref 150–400)
RBC: 3.88 MIL/uL — AB (ref 4.22–5.81)
RDW: 15.5 % (ref 11.5–15.5)
WBC: 8.9 10*3/uL (ref 4.0–10.5)

## 2016-01-26 LAB — BASIC METABOLIC PANEL
ANION GAP: 7 (ref 5–15)
BUN: 18 mg/dL (ref 6–20)
CHLORIDE: 101 mmol/L (ref 101–111)
CO2: 29 mmol/L (ref 22–32)
Calcium: 9.1 mg/dL (ref 8.9–10.3)
Creatinine, Ser: 0.69 mg/dL (ref 0.61–1.24)
GFR calc non Af Amer: >60 mL/min (ref >60)
Glucose, Bld: 125 mg/dL — ABNORMAL HIGH (ref 65–99)
Potassium: 3.7 mmol/L (ref 3.5–5.1)
SODIUM: 137 mmol/L (ref 135–145)

## 2016-01-26 MED ORDER — SODIUM CHLORIDE 0.9 % IV SOLN: Freq: Once | INTRAVENOUS | Status: DC

## 2016-01-26 MED ORDER — ACETAMINOPHEN 325 MG PO TABS
650.0000 mg | ORAL_TABLET | Freq: Once | ORAL | Status: AC
  Administered 2016-01-26: 650 mg via ORAL
  Filled 2016-01-26: qty 2

## 2016-01-26 MED ORDER — HEPARIN SOD (PORK) LOCK FLUSH 100 UNIT/ML IV SOLN
500.0000 [IU] | Freq: Once | INTRAVENOUS | Status: DC | PRN
  Filled 2016-01-26: qty 5

## 2016-01-26 MED ORDER — EPINEPHRINE HCL 0.1 MG/ML IJ SOSY: 0.2500 mg | PREFILLED_SYRINGE | Freq: Once | INTRAMUSCULAR | Status: DC | PRN

## 2016-01-26 MED ORDER — SODIUM CHLORIDE 0.9 % IV SOLN
Freq: Once | INTRAVENOUS | Status: AC
  Administered 2016-01-26: 16 mg via INTRAVENOUS
  Filled 2016-01-26: qty 8

## 2016-01-26 MED ORDER — DIPHENHYDRAMINE HCL 50 MG PO CAPS
50.0000 mg | ORAL_CAPSULE | Freq: Once | ORAL | Status: AC
  Administered 2016-01-26: 50 mg via ORAL
  Filled 2016-01-26: qty 1

## 2016-01-26 MED ORDER — SODIUM CHLORIDE 0.9% FLUSH: 3.0000 mL | INTRAVENOUS | Status: DC | PRN

## 2016-01-26 MED ORDER — ALBUTEROL SULFATE (2.5 MG/3ML) 0.083% IN NEBU: 2.5000 mg | INHALATION_SOLUTION | Freq: Once | RESPIRATORY_TRACT | Status: DC | PRN

## 2016-01-26 MED ORDER — EPINEPHRINE HCL 1 MG/ML IJ SOLN: 0.5000 mg | Freq: Once | INTRAMUSCULAR | Status: DC | PRN

## 2016-01-26 MED ORDER — HEPARIN SOD (PORK) LOCK FLUSH 100 UNIT/ML IV SOLN: 250.0000 [IU] | Freq: Once | INTRAVENOUS | Status: DC | PRN

## 2016-01-26 MED ORDER — DIPHENHYDRAMINE HCL 50 MG/ML IJ SOLN: 25.0000 mg | Freq: Once | INTRAMUSCULAR | Status: DC | PRN

## 2016-01-26 MED ORDER — ALTEPLASE 2 MG IJ SOLR: 2.0000 mg | Freq: Once | INTRAMUSCULAR | Status: DC | PRN

## 2016-01-26 MED ORDER — SODIUM CHLORIDE 0.9 % IV SOLN
375.0000 mg/m2 | Freq: Once | INTRAVENOUS | Status: AC
  Administered 2016-01-26: 800 mg via INTRAVENOUS
  Filled 2016-01-26: qty 80

## 2016-01-26 MED ORDER — SODIUM CHLORIDE 0.9% FLUSH: 10.0000 mL | INTRAVENOUS | Status: DC | PRN

## 2016-01-26 MED ORDER — METHYLPREDNISOLONE SODIUM SUCC 125 MG IJ SOLR: 125.0000 mg | Freq: Once | INTRAMUSCULAR | Status: DC | PRN

## 2016-01-26 MED ORDER — FAMOTIDINE IN NACL 20-0.9 MG/50ML-% IV SOLN: 20.0000 mg | Freq: Once | INTRAVENOUS | Status: DC | PRN

## 2016-01-26 MED ORDER — DIPHENHYDRAMINE HCL 50 MG/ML IJ SOLN: 50.0000 mg | Freq: Once | INTRAMUSCULAR | Status: DC | PRN

## 2016-01-26 MED ORDER — SODIUM CHLORIDE 0.9 % IV SOLN
750.0000 mg/m2 | Freq: Once | INTRAVENOUS | Status: AC
  Administered 2016-01-26: 1560 mg via INTRAVENOUS
  Filled 2016-01-26: qty 78

## 2016-01-26 MED ORDER — SODIUM CHLORIDE 0.9 % IV SOLN: Freq: Once | INTRAVENOUS | Status: DC | PRN

## 2016-01-26 NOTE — Progress Notes (Signed)
Chemotherapy dosage calculation verified with Aldean Baker RN.

## 2016-01-26 NOTE — Progress Notes (Signed)
Patient discharged to home, all discharge medications and instructions reviewed and questions answered.  Patient declined wheelchair assistance to vehicle, prefers to ambulate.

## 2016-01-26 NOTE — Discharge Instructions (Signed)
Followup in clinic for neulasta shot tomorrow.

## 2016-01-26 NOTE — Progress Notes (Signed)
Rituximab infusion initiated at '100mg'$ /hr. Will continue to monitor.

## 2016-01-26 NOTE — Telephone Encounter (Signed)
CALLED PATIENT TO CONF APPT. NO V/M AVAIL. APPT LTR & SCHD MAILED. 01/26/16

## 2016-01-26 NOTE — Progress Notes (Signed)
BSA, Dosage and Dilution for Cytoxan verified with 2nd chemo competent RN Nancy Marus.

## 2016-01-27 ENCOUNTER — Ambulatory Visit (HOSPITAL_BASED_OUTPATIENT_CLINIC_OR_DEPARTMENT_OTHER): Payer: PRIVATE HEALTH INSURANCE

## 2016-01-27 ENCOUNTER — Other Ambulatory Visit: Payer: Self-pay | Admitting: *Deleted

## 2016-01-27 ENCOUNTER — Telehealth: Payer: Self-pay | Admitting: *Deleted

## 2016-01-27 VITALS — BP 145/74 | HR 91 | Temp 97.1°F | Resp 16

## 2016-01-27 DIAGNOSIS — C8378 Burkitt lymphoma, lymph nodes of multiple sites: Secondary | ICD-10-CM | POA: Diagnosis not present

## 2016-01-27 DIAGNOSIS — Z5189 Encounter for other specified aftercare: Secondary | ICD-10-CM | POA: Diagnosis not present

## 2016-01-27 MED ORDER — CLINDAMYCIN HCL 150 MG PO CAPS: 450.0000 mg | ORAL_CAPSULE | Freq: Three times a day (TID) | ORAL | 0 refills | Status: DC

## 2016-01-27 MED ORDER — PEGFILGRASTIM INJECTION 6 MG/0.6ML ~~LOC~~
6.0000 mg | PREFILLED_SYRINGE | Freq: Once | SUBCUTANEOUS | Status: AC
  Administered 2016-01-27: 6 mg via SUBCUTANEOUS
  Filled 2016-01-27: qty 0.6

## 2016-01-27 NOTE — Telephone Encounter (Signed)
Second call received from Pharmacy in reference to order recently called in.  "We received call with this order, told to fill for ten days but received a three day supply quantity."  Advised to change quantity.

## 2016-01-27 NOTE — Discharge Summary (Signed)
[place]  [phone] [phone]    Physician Discharge Summary     Patient ID: Erik Vaughn MRN: [id number] DOB/AGE: Aug 04, 1950 65 y.o.  Admit date: 01/22/2016 Discharge date: 01/27/2016  Primary Care Physician:  Ernestene Kiel, MD  Discharge Diagnoses:    Present on Admission: . 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   aspirin EC 81 MG tablet Take 81 mg by mouth at bedtime.   CENTRUM SILVER PO Take 1 tablet by mouth daily with breakfast.   chlorhexidine 0.12 % solution Commonly known as:  PERIDEX Use as directed 15 mLs in the mouth or throat 3 (three) times daily.   clindamycin 150 MG capsule Commonly known as:  CLEOCIN Take 450 mg by mouth 3 (three) times daily. Started 08/14 for 10 days   clopidogrel 75 MG tablet Commonly known as:  PLAVIX Take 75 mg by mouth at bedtime.   dexamethasone 4 MG tablet Commonly known as:  DECADRON Take 2 tablets (8 mg total) by mouth 2 (two) times daily with a meal. Take two times a day starting the day after chemotherapy for 3 days.   lisinopril 20 MG tablet Commonly known as:  PRINIVIL,ZESTRIL Take 20 mg by mouth every evening.   LORazepam 0.5 MG tablet Commonly known as:  ATIVAN Take 1 tablet (0.5 mg total) by mouth every 6 (six) hours as needed (Nausea or vomiting). What changed:  when to take this  reasons to take this   magic mouthwash w/lidocaine Soln Take 5 mLs by mouth 4 (four) times daily.   ondansetron 8 MG tablet Commonly known as:  ZOFRAN Take 1 tablet  (8 mg total) by mouth 2 (two) times daily. Take two times a day starting the day after chemo for 3 days. Then take two times a day as needed for nausea or vomiting.   oxyCODONE 5 MG immediate release tablet Commonly known as:  Oxy IR/ROXICODONE Take 1-2 tablets (5-10 mg total) by mouth every 6 (six) hours as needed for moderate pain or severe pain.   polyethylene glycol packet Commonly known as:  MIRALAX Take 17 g by mouth daily. What changed:  when to take this  reasons to take this   prochlorperazine 10 MG tablet Commonly known as:  COMPAZINE Take 1 tablet (10 mg total) by mouth every 6 (six) hours as needed (Nausea or vomiting).   senna-docusate 8.6-50 MG tablet Commonly known as:  SENNA S Take 2 tablets by mouth at bedtime. What changed:  when to take this  reasons to take this   vitamin C 500 MG tablet Commonly known as:  ASCORBIC ACID Take 500 mg by mouth 2 (two) times daily.   Vitamin D3 2000 units Tabs Take 2,000 Units by mouth daily with lunch.        Disposition and Follow-up:  -Follow-up for Neulasta shot on 01/27/2016 Follow-up with Dr. Irene Limbo in 2 weeks with repeat labs prior to her cycle of chemotherapy. -Hold Plavix for 7 days prior to next inpatient hospitalization date given consideration of  intrathecal methotrexate.  Significant Diagnostic Studies:  Ir Fluoro Guide Cv Line Right  Result Date: 12/31/2015 CLINICAL DATA:  Diffuse large B-cell lymphoma, needs durable venous access for chemotherapy EXAM: TUNNELED PORT CATHETER PLACEMENT WITH ULTRASOUND AND FLUOROSCOPIC GUIDANCE FLUOROSCOPY TIME:  6 seconds, 2 mGy ANESTHESIA/SEDATION: Intravenous Fentanyl and Versed were administered as conscious sedation during continuous monitoring of the patient's level of consciousness and physiological / cardiorespiratory status by the radiology RN, with a total moderate sedation time of 15 minutes. TECHNIQUE: The procedure, risks, benefits, and alternatives were explained  to the patient. Questions regarding the procedure were encouraged and answered. The patient understands and consents to the procedure. As antibiotic prophylaxis, cefazolin 2 g was ordered pre-procedure and administered intravenously within one hour of incision. Patency of the right IJ vein was confirmed with ultrasound with image documentation. An appropriate skin site was determined. Skin site was marked. Region was prepped using maximum barrier technique including cap and mask, sterile gown, sterile gloves, large sterile sheet, and Chlorhexidine as cutaneous antisepsis. The region was infiltrated locally with 1% lidocaine. Under real-time ultrasound guidance, the right IJ vein was accessed with a 21 gauge micropuncture needle; the needle tip within the vein was confirmed with ultrasound image documentation. Needle was exchanged over a 018 guidewire for transitional dilator which allowed passage of the [facility] wire into the IVC. Over this, the transitional dilator was exchanged for a 5 [place] MPA catheter. A small incision was made on the right anterior chest wall and a subcutaneous pocket fashioned. The power-injectable port was positioned and its catheter tunneled to the right IJ dermatotomy site. The MPA catheter was exchanged over an Amplatz wire for a peel-away sheath, through which the port catheter, which had been trimmed to the appropriate length, was advanced and positioned under fluoroscopy with its tip at the cavoatrial junction. Spot chest radiograph confirms good catheter position and no pneumothorax. The pocket was closed with deep interrupted and subcuticular continuous 3-0 Monocryl sutures. The port was flushed per protocol. The incisions were covered with Dermabond then covered with a sterile dressing. COMPLICATIONS: COMPLICATIONS None immediate IMPRESSION: Technically successful right IJ power-injectable port catheter placement. Ready for routine use. Electronically Signed   By: Lucrezia Europe M.D.    On: 12/31/2015 10:20   Ir US Guide Vasc Access Right  Result Date: 12/31/2015 CLINICAL DATA:  Diffuse large B-cell lymphoma, needs durable venous access for chemotherapy EXAM: TUNNELED PORT CATHETER PLACEMENT WITH ULTRASOUND AND FLUOROSCOPIC GUIDANCE FLUOROSCOPY TIME:  6 seconds, 2 mGy ANESTHESIA/SEDATION: Intravenous Fentanyl and Versed were administered as conscious sedation during continuous monitoring of the patient's level of consciousness and physiological / cardiorespiratory status by the radiology RN, with a total moderate sedation time of 15 minutes. TECHNIQUE: The procedure, risks, benefits, and alternatives were explained to the patient. Questions regarding the procedure were encouraged and answered. The patient understands and consents to the procedure. As antibiotic prophylaxis, cefazolin 2 g was ordered pre-procedure and administered intravenously within one hour of incision. Patency of the right IJ vein was confirmed with ultrasound with image documentation. An appropriate skin site was determined. Skin site was marked. Region was prepped using maximum barrier technique including cap and mask, sterile gown, sterile gloves, large sterile sheet, and Chlorhexidine as cutaneous antisepsis. The region was infiltrated locally with 1% lidocaine. Under real-time ultrasound guidance, the right IJ vein was accessed with a 21 gauge micropuncture needle; the needle tip within the vein was confirmed with ultrasound image documentation. Needle was exchanged over a 018  guidewire for transitional dilator which allowed passage of the [facility] wire into the IVC. Over this, the transitional dilator was exchanged for a 5 [place] MPA catheter. A small incision was made on the right anterior chest wall and a subcutaneous pocket fashioned. The power-injectable port was positioned and its catheter tunneled to the right IJ dermatotomy site. The MPA catheter was exchanged over an Amplatz wire for a peel-away sheath, through  which the port catheter, which had been trimmed to the appropriate length, was advanced and positioned under fluoroscopy with its tip at the cavoatrial junction. Spot chest radiograph confirms good catheter position and no pneumothorax. The pocket was closed with deep interrupted and subcuticular continuous 3-0 Monocryl sutures. The port was flushed per protocol. The incisions were covered with Dermabond then covered with a sterile dressing. COMPLICATIONS: COMPLICATIONS None immediate IMPRESSION: Technically successful right IJ power-injectable port catheter placement. Ready for routine use. Electronically Signed   By: Lucrezia Europe M.D.   On: 12/31/2015 10:20   Ct Biopsy  Result Date: 12/31/2015 CLINICAL DATA:  Diffuse large B-cell lymphoma, osseous metastases EXAM: CT GUIDED DEEP ILIAC BONE ASPIRATION AND CORE BIOPSY TECHNIQUE: The procedure, risks (including but not limited to bleeding, infection, organ damage ), benefits, and alternatives were explained to the patient. Questions regarding the procedure were encouraged and answered. The patient understands and consents to the procedure. Patient was placed supine on the CT gantry and limited axial scans through the pelvis were obtained. Appropriate skin entry site was identified. Skin site was marked, prepped with Betadine, draped in usual sterile fashion, and infiltrated locally with 1% lidocaine. Intravenous Fentanyl and Versed were administered as conscious sedation during continuous monitoring of the patient's level of consciousness and physiological / cardiorespiratory status by the radiology RN, with a total moderate sedation time of 9 minutes. Under CT fluoroscopic guidance a trocar bone needle was advanced into the right iliac bone just lateral to the sacroiliac joint using the OncoTrol device. Once needle tip position was confirmed, coaxial core and aspiration samples were obtained. The final sample was obtained using the guiding needle itself, which was  then removed. Post procedure scans show no hematoma or fracture. Patient tolerated procedure well. COMPLICATIONS: COMPLICATIONS none IMPRESSION: 1. Technically successful CT guided right iliac bone core and aspiration biopsy. Electronically Signed   By: Lucrezia Europe M.D.   On: 12/31/2015 10:20   Ct Maxillofacial W Contrast  Result Date: 01/11/2016 CLINICAL DATA:  Initial evaluation for acute abscess that roof with map for 3 days. Swelling at right-sided upper palate. EXAM: CT MAXILLOFACIAL WITH CONTRAST TECHNIQUE: Multidetector CT imaging of the maxillofacial structures was performed with intravenous contrast. Multiplanar CT image reconstructions were also generated. A small metallic BB was placed on the right temple in order to reliably differentiate right from left. CONTRAST:  20m ISOVUE-300 IOPAMIDOL (ISOVUE-300) INJECTION 61% COMPARISON:  None. FINDINGS: Visualized portions of the brain are within normal limits without acute abnormality. Globes and orbits within normal limits. The scattered dental disease present. There is dehiscence of the alveolar ridge along its lingual aspect at the base of the right first/second maxillary molar (Series 3, image 35). Adjacent subperiosteal abscess measures 9 x 7 x 10 mm (series 2, image 36). No other abscess at the roof of maps. No other significant inflammatory changes about the dentition, although evaluation somewhat limited due to dental amalgam. Mild-to-moderate mucosal thickening at the floor of the right maxilla sinus, with more minimal mucosal thickening at the floor left maxillary sinus. Paranasal  sinuses are otherwise largely clear. Remainder of the visualized soft tissues of the face demonstrate no acute abnormality. IMPRESSION: 1. 9 x 7 x 10 mm subperiosteal abscess at the right roof of mouth, just medial to the base of the right first and second right maxillary molars. Abscesses is likely odontogenic in origin. 2. Mild maxillary sinus disease, right worse than  left. Electronically Signed   By: Jeannine Boga M.D.   On: 01/11/2016 22:40   Dg Fluoro Guide Lumbar Puncture  Result Date: 01/01/2016 CLINICAL DATA:  65 year old male with Burkitt's lymphoma. Subsequent encounter. EXAM: DIAGNOSTIC LUMBAR PUNCTURE UNDER FLUOROSCOPIC GUIDANCE FLUOROSCOPY TIME:  If the device does not provide the exposure index: Fluoroscopy Time (in minutes and seconds):  20 second Number of Acquired Images:  2 PROCEDURE: Case discussed with staff. No clinical suspicion for intracranial abnormality. Informed consent was obtained from the patient prior to the procedure, including potential complications of headache, bleeding allergy, and pain. Time-out performed. With the patient prone, the lower back was prepped with Betadine. 1% Lidocaine was used for local anesthesia. Lumbar puncture was performed at the L2-3 level using a 22 gauge needle with return of initially minimally blood tinged CSF which cleared with an opening pressure of 12 cm water. Ten ml of CSF were obtained for laboratory studies. The patient tolerated the procedure well and there were no apparent complications. IMPRESSION: L2-3 lumbar puncture with collection of 10 cc of cerebral spinal fluid sent for labs as per request. Electronically Signed   By: Genia Del M.D.   On: 01/01/2016 14:01    Discharge Laboratory Values: . CBC Latest Ref Rng & Units 01/26/2016 01/25/2016 01/24/2016  WBC 4.0 - 10.5 K/uL 8.9 15.8(H) 21.7(H)  Hemoglobin 13.0 - 17.0 g/dL 10.6(L) 11.1(L) 9.9(L)  Hematocrit 39.0 - 52.0 % 31.6(L) 33.5(L) 29.7(L)  Platelets 150 - 400 K/uL 453(H) 509(H) 425(H)   . CMP Latest Ref Rng & Units 01/26/2016 01/25/2016 01/24/2016  Glucose 65 - 99 mg/dL 125(H) 115(H) 151(H)  BUN 6 - 20 mg/dL _0 Creatinine 0.61 - 1.24 mg/dL 0.69 0.61 0.60(L)  Sodium 135 - 145 mmol/L 137 138 139  Potassium 3.5 - 5.1 mmol/L 3.7 4.0 4.4  Chloride 101 - 111 mmol/L 101 101 105  CO2 22 - 32 mmol/L _1 Calcium 8.9 - 10.3  mg/dL 9.1 9.4 9.2  Total Protein 6.5 - 8.1 g/dL - - -  Total Bilirubin 0.3 - 1.2 mg/dL - - -  Alkaline Phos 38 - 126 U/L - - -  AST 15 - 41 U/L - - -  ALT 17 - 63 U/L - - -     Brief H and P:  Erik Vaughn is a wonderful 65 y.o. male who was recently seen in clinic and was admitted for his second cycle of EPOCH-R for Stage IVB Burkitt's lymphoma (with t(8;14) translocation).  he tolerated chemotherapy without any acute issues are prohibitive toxicities and was feeling well on discharge without any active concerns.   #1 Stage IVB Burkitt's lymphoma with t(8;14) translocation This appears to be involving the sacrum causing sacral and root involvement with possible neurogenic bladder (patient has had significant improvement and control of his bladder) PET CT scan as noted above shows extensive involvement with lymphoma. CSF negative for involvement. Bone marrow biopsy negative for involvement by lymphoma. Patient has completed cycle 2 of EPOCH-R and tolerated it well with no prohibitive toxicities. Plan -Continue same supportive medications at home -Follow-up in clinic for  Neulasta shot tomorrow. -Will need to hold Plavix for one week prior to his next cycle of chemotherapy since we shall be starting his CNS prophylaxis with intrathecal methotrexate on day 1 of his next cycle. -Allopurinol was discontinued due to low risk of tumor lysis at this point.  #2 right upper jaw odontogenic abscess resolved with clindamycin.Spontaneously drained. On clindamycin since 01/11/2016.No current active symptoms at this time. Plan -Continue clindamycin to complete a total of 3-4 weeks of treatment. Continue Peridex mouth washes.-  #3left sacral ala and S1 fracture. L5-S1 and S1-S2 left-sided nerve root compression. #4neoplasm related pain - well controlled with only using when necessary oxycodone. Plan continue when necessary oxycodone with GI prophylaxis   Physical Exam at Discharge: BP  (!) 151/70 (BP Location: Right Arm)   Pulse 84   Temp 97.6 F (36.4 C) (Oral)   Resp 16   Ht 6' (1.829 m)   Wt 186 lb 14.4 oz (84.8 kg)   SpO2 97%   BMI 25.35 kg/m  GENERAL:alert, in no acute distress and comfortable SKIN: skin color, texture, turgor are normal, no rashes or significant lesions EYES: normal, conjunctiva are pink and non-injected, sclera clear OROPHARYNX:no exudate, no erythema and lips, buccal mucosa, and tongue normal  NECK: supple, no JVD, thyroid normal size, non-tender, without nodularity LYMPH:  no palpable lymphadenopathy in the cervical, axillary or inguinal LUNGS: clear to auscultation with normal respiratory effort HEART: regular rate & rhythm,  no murmurs and no lower extremity edema ABDOMEN: abdomen soft, non-tender, normoactive bowel sounds  Musculoskeletal: no cyanosis of digits and no clubbing  PSYCH: alert & oriented x 3 with fluent speech NEURO: no focal motor/sensory deficits    Hospital Course:  Active Problems:   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  Signed: Dr. Sullivan Lone MD [place] [phone]  01/27/2016, 7:23 AM   total time spent discharging patient more than 30 minutes

## 2016-01-27 NOTE — Telephone Encounter (Signed)
"  [name] with [name] [phone].  Patient called requesting refill for Clindamycin saying Dr. Irene Limbo wants him to continue taking this.  Has enough left for two more days.  Not sure if he started late, missed days but need clarification and refill if needed.  Please call." Discharge note reads continue for fourteen days.

## 2016-02-06 ENCOUNTER — Other Ambulatory Visit: Payer: Self-pay

## 2016-02-06 ENCOUNTER — Inpatient Hospital Stay (HOSPITAL_COMMUNITY)
Admission: EM | Admit: 2016-02-06 | Discharge: 2016-02-08 | DRG: 871 | Disposition: A | Discharge status: Home / Self Care | Payer: PRIVATE HEALTH INSURANCE | Attending: Internal Medicine | Admitting: Internal Medicine

## 2016-02-06 ENCOUNTER — Emergency Department (HOSPITAL_COMMUNITY): Payer: PRIVATE HEALTH INSURANCE

## 2016-02-06 ENCOUNTER — Other Ambulatory Visit (HOSPITAL_COMMUNITY): Payer: Self-pay

## 2016-02-06 ENCOUNTER — Encounter (HOSPITAL_COMMUNITY): Payer: Self-pay

## 2016-02-06 DIAGNOSIS — A419 Sepsis, unspecified organism: Principal | ICD-10-CM | POA: Diagnosis present

## 2016-02-06 DIAGNOSIS — C799 Secondary malignant neoplasm of unspecified site: Secondary | ICD-10-CM | POA: Diagnosis present

## 2016-02-06 DIAGNOSIS — I739 Peripheral vascular disease, unspecified: Secondary | ICD-10-CM | POA: Diagnosis present

## 2016-02-06 DIAGNOSIS — E878 Other disorders of electrolyte and fluid balance, not elsewhere classified: Secondary | ICD-10-CM | POA: Diagnosis present

## 2016-02-06 DIAGNOSIS — Z87891 Personal history of nicotine dependence: Secondary | ICD-10-CM | POA: Diagnosis not present

## 2016-02-06 DIAGNOSIS — G893 Neoplasm related pain (acute) (chronic): Secondary | ICD-10-CM | POA: Diagnosis present

## 2016-02-06 DIAGNOSIS — Z7952 Long term (current) use of systemic steroids: Secondary | ICD-10-CM

## 2016-02-06 DIAGNOSIS — I10 Essential (primary) hypertension: Secondary | ICD-10-CM | POA: Diagnosis present

## 2016-02-06 DIAGNOSIS — J9601 Acute respiratory failure with hypoxia: Secondary | ICD-10-CM | POA: Diagnosis present

## 2016-02-06 DIAGNOSIS — C8378 Burkitt lymphoma, lymph nodes of multiple sites: Secondary | ICD-10-CM | POA: Diagnosis present

## 2016-02-06 DIAGNOSIS — Z7902 Long term (current) use of antithrombotics/antiplatelets: Secondary | ICD-10-CM | POA: Diagnosis not present

## 2016-02-06 DIAGNOSIS — E861 Hypovolemia: Secondary | ICD-10-CM | POA: Diagnosis present

## 2016-02-06 DIAGNOSIS — R197 Diarrhea, unspecified: Secondary | ICD-10-CM | POA: Diagnosis present

## 2016-02-06 DIAGNOSIS — Z79899 Other long term (current) drug therapy: Secondary | ICD-10-CM

## 2016-02-06 DIAGNOSIS — R509 Fever, unspecified: Secondary | ICD-10-CM | POA: Diagnosis not present

## 2016-02-06 DIAGNOSIS — E871 Hypo-osmolality and hyponatremia: Secondary | ICD-10-CM | POA: Diagnosis present

## 2016-02-06 DIAGNOSIS — R011 Cardiac murmur, unspecified: Secondary | ICD-10-CM | POA: Diagnosis present

## 2016-02-06 DIAGNOSIS — D899 Disorder involving the immune mechanism, unspecified: Secondary | ICD-10-CM | POA: Diagnosis present

## 2016-02-06 DIAGNOSIS — N390 Urinary tract infection, site not specified: Secondary | ICD-10-CM

## 2016-02-06 DIAGNOSIS — C8370 Burkitt lymphoma, unspecified site: Secondary | ICD-10-CM

## 2016-02-06 DIAGNOSIS — N319 Neuromuscular dysfunction of bladder, unspecified: Secondary | ICD-10-CM | POA: Diagnosis present

## 2016-02-06 DIAGNOSIS — D72829 Elevated white blood cell count, unspecified: Secondary | ICD-10-CM

## 2016-02-06 LAB — CBC WITH DIFFERENTIAL/PLATELET
Basophils Absolute: 0 10*3/uL (ref 0.0–0.1)
Basophils Relative: 0 %
EOS PCT: 0 %
Eosinophils Absolute: 0 10*3/uL (ref 0.0–0.7)
HEMATOCRIT: 27.2 % — AB (ref 39.0–52.0)
Hemoglobin: 9.1 g/dL — ABNORMAL LOW (ref 13.0–17.0)
Lymphocytes Relative: 5 %
Lymphs Abs: 1 10*3/uL (ref 0.7–4.0)
MCH: 27.5 pg (ref 26.0–34.0)
MCHC: 33.5 g/dL (ref 30.0–36.0)
MCV: 82.2 fL (ref 78.0–100.0)
MONO ABS: 1.7 10*3/uL — AB (ref 0.1–1.0)
MONOS PCT: 9 %
NEUTROS PCT: 86 %
Neutro Abs: 16.4 10*3/uL — ABNORMAL HIGH (ref 1.7–7.7)
PLATELETS: 138 10*3/uL — AB (ref 150–400)
RBC: 3.31 MIL/uL — AB (ref 4.22–5.81)
RDW: 17.6 % — ABNORMAL HIGH (ref 11.5–15.5)
WBC: 19.1 10*3/uL — AB (ref 4.0–10.5)

## 2016-02-06 LAB — COMPREHENSIVE METABOLIC PANEL
ALBUMIN: 3.5 g/dL (ref 3.5–5.0)
ALK PHOS: 96 U/L (ref 38–126)
ALT: 19 U/L (ref 17–63)
AST: 20 U/L (ref 15–41)
Anion gap: 9 (ref 5–15)
BILIRUBIN TOTAL: 0.7 mg/dL (ref 0.3–1.2)
BUN: 18 mg/dL (ref 6–20)
CALCIUM: 8.9 mg/dL (ref 8.9–10.3)
CO2: 25 mmol/L (ref 22–32)
CREATININE: 0.89 mg/dL (ref 0.61–1.24)
Chloride: 93 mmol/L — ABNORMAL LOW (ref 101–111)
GFR calc Af Amer: >60 mL/min (ref >60)
GFR calc non Af Amer: >60 mL/min (ref >60)
GLUCOSE: 169 mg/dL — AB (ref 65–99)
Potassium: 4.3 mmol/L (ref 3.5–5.1)
SODIUM: 127 mmol/L — AB (ref 135–145)
TOTAL PROTEIN: 6.6 g/dL (ref 6.5–8.1)

## 2016-02-06 LAB — URINE MICROSCOPIC-ADD ON: Squamous Epithelial / LPF: NONE SEEN

## 2016-02-06 LAB — URINALYSIS, ROUTINE W REFLEX MICROSCOPIC
BILIRUBIN URINE: NEGATIVE
GLUCOSE, UA: NEGATIVE mg/dL
HGB URINE DIPSTICK: NEGATIVE
KETONES UR: NEGATIVE mg/dL
Nitrite: NEGATIVE
PH: 6 (ref 5.0–8.0)
PROTEIN: NEGATIVE mg/dL
Specific Gravity, Urine: 1.012 (ref 1.005–1.030)

## 2016-02-06 LAB — I-STAT CG4 LACTIC ACID, ED: Lactic Acid, Venous: 0.92 mmol/L (ref 0.5–1.9)

## 2016-02-06 MED ORDER — ACETAMINOPHEN 325 MG PO TABS
650.0000 mg | ORAL_TABLET | Freq: Once | ORAL | Status: AC
  Administered 2016-02-06: 650 mg via ORAL
  Filled 2016-02-06: qty 2

## 2016-02-06 MED ORDER — SODIUM CHLORIDE 0.9 % IV BOLUS (SEPSIS)
1000.0000 mL | Freq: Once | INTRAVENOUS | Status: AC
  Administered 2016-02-06: 1000 mL via INTRAVENOUS

## 2016-02-06 MED ORDER — DEXTROSE 5 % IV SOLN
2.0000 g | INTRAVENOUS | Status: DC
  Administered 2016-02-06: 2 g via INTRAVENOUS
  Filled 2016-02-06: qty 2

## 2016-02-06 NOTE — ED Triage Notes (Addendum)
Pt states that he had chills today and took his temperature. He states that it was 102.9 at home. Last chemotherapy treatment was 8/28 of this year. Wife states that pt has been lethargic at home all day and has also had some urinary urgency. A&Ox4. Pt  Is afebrile in triage but O2 sat is 90% on RA. Pt states that he does not use O2 at home. Wife also states "he just doesn't have the right look today."

## 2016-02-06 NOTE — H&P (Addendum)
History and Physical    Erik Vaughn YPP:[id number] DOB: Aug 02, 1950 DOA: 02/06/2016  PCP: Ernestene Kiel, MD Consultants:  Irene Limbo - oncology; Milas Kocher - cardiology Patient coming from: lives at home with wife  Chief Complaint: fever, lethargy  HPI: Erik Vaughn is a 65 y.o. male with medical history significant of Stage IVB Burkitt lymphoma. Tonight about 1600 he developed fever, chills, and lethargy.  Fever to 103.5 at home.  He went to the grocery earlier today and he came back very pale and extremely tired, somewhat agitated or antsy prior to fever starting, "trying to get warm."  Felt urgency but inability to void much.  Dysuria in ER.    Burkett's lymphoma - diagnosed <2 months ago.  Hip pain, MRI showed mass, PET with mets.  2 chemo treatments thus far.  His lymphoma involves "the sacrum causing sacral and root involvement with possible neurogenic bladder" which has improved prior to tonight.  He is currently holding Plavix in anticipation of possible intrathecal methotrexate for CNS prophylaxis on day 1 of his next cycle (scheduled for this coming Thursday).   ED Course: Per Dr. Oleta Mouse: History of metastatic lymphoma on chemotherapy who presents with fever and chills. Has sepsis due to a urinary tract infection. Does have a fever of 100.8 Fahrenheit with tachycardia and oxygen requirement. Is normotensive. With normal lactate but leukocytosis of 19 with left shift. Also with hyponatremia hypochloremia. Did receive 3 L of IV fluids per sepsis protocol. UA suggestive of UTI and given ceftriaxone. Chest x-ray shows no infiltrate or other acute cardiopulmonary processes. Urine cultures also collected prior to antibiotics. Spoke with Dr. Lorin Mercy who will admit for ongoing management.  Review of Systems: As per HPI; otherwise 10 point review of systems reviewed and negative.   Ambulatory Status:  Walks without assistance or uses cane  Past Medical History:  Diagnosis Date  . Cancer ([place])    Burkett Lymphoma  . Hypertension   . PAD (peripheral artery disease) (HCC) left leg    Past Surgical History:  Procedure Laterality Date  . IR GENERIC HISTORICAL  12/31/2015   IR FLUORO GUIDE CV LINE RIGHT 12/31/2015 Arne Cleveland, MD [facility]  . IR GENERIC HISTORICAL  12/31/2015   IR US GUIDE VASC ACCESS RIGHT 12/31/2015 Arne Cleveland, MD [facility]  . left salivary Left    removed    Social History   Social History  . Marital status: Married    Spouse name: N/A  . Number of children: N/A  . Years of education: N/A   Occupational History  . meter specialist    Social History Main Topics  . Smoking status: Former Smoker    Packs/day: 2.00    Years: 16.00    Quit date: 12/22/2005  . Smokeless tobacco: Never Used  . Alcohol use Yes     Comment: occasionally  . Drug use: No  . Sexual activity: No   Other Topics Concern  . Not on file   Social History Narrative  . No narrative on file    No Known Allergies  Family History  Problem Relation Age of Onset  . Alzheimer's disease Father 27    Prior to Admission medications   Medication Sig Start Date End Date Taking? Authorizing Provider  amLODipine (NORVASC) 10 MG tablet Take 10 mg by mouth every evening.  11/19/15  Yes Historical Provider, MD  Cholecalciferol (VITAMIN D3) 2000 units TABS Take 2,000 Units by mouth daily with lunch.    Yes Historical Provider, MD  clindamycin (CLEOCIN) 150 MG capsule Take 3 capsules (450 mg total) by mouth 3 (three) times daily. Started 08/14 for 10 days 01/27/16  Yes Brunetta Genera, MD  dexamethasone (DECADRON) 4 MG tablet Take 2 tablets (8 mg total) by mouth 2 (two) times daily with a meal. Take two times a day starting the day after chemotherapy for 3 days. 01/05/16  Yes Brunetta Genera, MD  lisinopril (PRINIVIL,ZESTRIL) 20 MG tablet Take 20 mg by mouth every evening.  11/19/15  Yes Historical Provider, MD  LORazepam (ATIVAN) 0.5 MG tablet Take 1 tablet (0.5 mg total) by mouth  every 6 (six) hours as needed (Nausea or vomiting). Patient taking differently: Take 0.5 mg by mouth every 8 (eight) hours as needed for anxiety.  01/05/16  Yes Brunetta Genera, MD  magic mouthwash w/lidocaine SOLN Take 5 mLs by mouth 4 (four) times daily. 01/09/16  Yes Susanne Borders, NP  Multiple Vitamins-Minerals (CENTRUM SILVER PO) Take 1 tablet by mouth daily with breakfast.    Yes Historical Provider, MD  ondansetron (ZOFRAN) 8 MG tablet Take 1 tablet (8 mg total) by mouth 2 (two) times daily. Take two times a day starting the day after chemo for 3 days. Then take two times a day as needed for nausea or vomiting. 01/05/16  Yes Gautam Juleen China, MD  oxyCODONE (OXY IR/ROXICODONE) 5 MG immediate release tablet Take 1-2 tablets (5-10 mg total) by mouth every 6 (six) hours as needed for moderate pain or severe pain. 01/20/16  Yes Brunetta Genera, MD  polyethylene glycol [facility]) packet Take 17 g by mouth daily. Patient taking differently: Take 17 g by mouth daily as needed for moderate constipation.  12/18/15  Yes Brunetta Genera, MD  prochlorperazine (COMPAZINE) 10 MG tablet Take 1 tablet (10 mg total) by mouth every 6 (six) hours as needed (Nausea or vomiting). 01/05/16  Yes Brunetta Genera, MD  senna-docusate (SENNA S) 8.6-50 MG tablet Take 2 tablets by mouth at bedtime. Patient taking differently: Take 2 tablets by mouth at bedtime as needed for moderate constipation.  12/18/15  Yes Brunetta Genera, MD  vitamin C (ASCORBIC ACID) 500 MG tablet Take 500 mg by mouth 2 (two) times daily.   Yes Historical Provider, MD  aspirin EC 81 MG tablet Take 81 mg by mouth at bedtime.    Historical Provider, MD  chlorhexidine (PERIDEX) 0.12 % solution Use as directed 15 mLs in the mouth or throat 3 (three) times daily. Patient not taking: Reported on 02/06/2016 01/20/16   Brunetta Genera, MD  clopidogrel (PLAVIX) 75 MG tablet Take 75 mg by mouth at bedtime.     Historical Provider, MD     Physical Exam: Vitals:   02/06/16 2130 02/06/16 2200 02/06/16 2300 02/06/16 2330  BP: (!) 102/54 (!) 101/51 (!) 100/54 99/55  Pulse: 97 97 98 92  Resp: '22 22 15 17  '$ Temp:      TempSrc:      SpO2: 93% 93% 90% (!) 89%     General: Appears calm and comfortable and is NAD; he is somnolent but arousable Eyes:  PERRL, EOMI, normal lids, iris ENT: hard of hearing, lips & tongue, mmm Neck:  no LAD, masses or thyromegaly Cardiovascular:  RRR, no r/g. +6-9/4 systolic murmur.  No LE edema.  Respiratory: CTA bilaterally, no w/r/r. Normal respiratory effort.  There are diminished breath sounds in the RLL.  Patient with O2 sats into 80s periodically during my evaluation. Abdomen:  soft, ntnd, NABS Skin:  no rash or induration seen on limited exam Musculoskeletal:  grossly normal tone BUE/BLE, good ROM, no bony abnormality Psychiatric:  grossly normal mood and affect, speech fluent and appropriate, AOx3 Neurologic:  CN 2-12 grossly intact, moves all extremities in coordinated fashion, sensation intact  Labs on Admission: I have personally reviewed following labs and imaging studies  CBC:  Recent Labs Lab 02/06/16 2040  WBC 19.1*  NEUTROABS 16.4*  HGB 9.1*  HCT 27.2*  MCV 82.2  PLT 811*   Basic Metabolic Panel:  Recent Labs Lab 02/06/16 2039  NA 127*  K 4.3  CL 93*  CO2 25  GLUCOSE 169*  BUN 18  CREATININE 0.89  CALCIUM 8.9   GFR: Estimated Creatinine Clearance: 93.2 mL/min (by C-G formula based on SCr of 0.89 mg/dL). Liver Function Tests:  Recent Labs Lab 02/06/16 2039  AST 20  ALT 19  ALKPHOS 96  BILITOT 0.7  PROT 6.6  ALBUMIN 3.5   No results for input(s): LIPASE, AMYLASE in the last 168 hours. No results for input(s): AMMONIA in the last 168 hours. Coagulation Profile: No results for input(s): INR, PROTIME in the last 168 hours. Cardiac Enzymes: No results for input(s): CKTOTAL, CKMB, CKMBINDEX, TROPONINI in the last 168 hours. BNP (last 3  results) No results for input(s): PROBNP in the last 8760 hours. HbA1C: No results for input(s): HGBA1C in the last 72 hours. CBG: No results for input(s): GLUCAP in the last 168 hours. Lipid Profile: No results for input(s): CHOL, HDL, LDLCALC, TRIG, CHOLHDL, LDLDIRECT in the last 72 hours. Thyroid Function Tests: No results for input(s): TSH, T4TOTAL, FREET4, T3FREE, THYROIDAB in the last 72 hours. Anemia Panel: No results for input(s): VITAMINB12, FOLATE, FERRITIN, TIBC, IRON, RETICCTPCT in the last 72 hours. Urine analysis:    Component Value Date/Time   COLORURINE YELLOW 02/06/2016 2002   APPEARANCEUR CLEAR 02/06/2016 2002   LABSPEC 1.012 02/06/2016 2002   PHURINE 6.0 02/06/2016 2002   GLUCOSEU NEGATIVE 02/06/2016 2002   HGBUR NEGATIVE 02/06/2016 2002   BILIRUBINUR NEGATIVE 02/06/2016 2002   [place] NEGATIVE 02/06/2016 2002   PROTEINUR NEGATIVE 02/06/2016 2002   NITRITE NEGATIVE 02/06/2016 2002   LEUKOCYTESUR SMALL (A) 02/06/2016 2002    Creatinine Clearance: Estimated Creatinine Clearance: 93.2 mL/min (by C-G formula based on SCr of 0.89 mg/dL).  Sepsis Labs: '@LABRCNTIP'$ (procalcitonin:4,lacticidven:4) )No results found for this or any previous visit (from the past 240 hour(s)).   Radiological Exams on Admission: Dg Chest 2 View  Result Date: 02/06/2016 CLINICAL DATA:  Fevers. EXAM: CHEST  2 VIEW COMPARISON:  7/20/ 17 FINDINGS: There is a right chest wall port a catheter with tip in the cavoatrial junction. Normal heart size. Lung volumes are low. No pleural effusion or edema. No airspace consolidation. Asymmetric elevation of the right hemidiaphragm is again noted. IMPRESSION: No active cardiopulmonary disease. Electronically Signed   By: Kerby Moors M.D.   On: 02/06/2016 20:39    EKG: Independently reviewed.  NSR with rate 95; nonspecific ST changes/R wave progression with no evidence of acute ischemia  Assessment/Plan Principal Problem:   Sepsis (HCC) Active  Problems:   Burkitt lymphoma of lymph nodes of multiple regions [facility])   Neoplasm related pain   Murmur   Hyponatremia   Acute respiratory failure with hypoxia (HCC)   Sepsis -Elevated WBC count, fever, tachycardia with normal lactate but borderline hypotension -Sepsis protocol initiated -Was given Rocephin for presumed urinary source while in ER; however, patient has new-onset O2  requirement (acute respiratory failure with hypoxia) and a relatively clean UA (small LE, rare bacteria).  He also has a h/o neurogenic bladder and so this may be causing/contributing to his urinary symptoms.  Will check PVR and if >500 ml will place foley cath. -RLL with diminished breath sounds; CXR indicates persistent R hemidiaphragm elevation as cause and otherwise no acute disease -He is also immunocompromised due to his cancer and with recent hospitalization; with this clinical uncertainty will cover with Vanc/Zosyn for now -Blood and urine cultures pending -Will admit and continue to monitor  Burkitt lymphoma -Followed by Dr. Irene Limbo, who has been added to the treatment team -Suggest inpt consultation tomorrow or Monday if patient has not been seen -Scheduled for next round of chemotherapy to begin Thursday; this may need to be delayed  Neoplasm related pain -I have reviewed this patient in the [place] Controlled Substances Reporting System.   -It appears that he is requiring more medication that is prescribed and is self-escalating his doses (prescriptions are not lasting as long as they should). -This needs to be monitored on an ongoing basis  Murmur -Prior recent oncology notes do not report a murmur -Patient is presenting with sepsis with a murmur -If still present tomorrow, suggest echocardiogram and possible TEE  Hyponatremia -This is acute and is likely hypovolemic in nature -Patient has already received the sepsis bolus and will continue to receive boluses prn hypotension -Will follow sodium  daily  DVT prophylaxis:  Lovenox  Code Status:  Full - confirmed with patient/family Family Communication: Wife and son present throughout evaluation  Disposition Plan:  Home once clinically improved Consults called: Onc (added to treatment team but likely needs telephone consult in next 1-2 days)  Admission status: Admit - I anticipate that this patient will require at least 2 midnights in the hospital based on the medical complexity of the problems presented.    Karmen Bongo MD [facility]  If 7PM-7AM, please contact night-coverage [email or web address] Password [email or web address]  02/07/2016, 12:27 AM

## 2016-02-06 NOTE — ED Provider Notes (Signed)
[place] DEPT Provider Note   CSN: [id number] Arrival date & time: 02/06/16  1939     History   Chief Complaint Chief Complaint  Patient presents with  . Fever    cancer patient    HPI Erik Vaughn is a 65 y.o. male.  HPI 65 year old male who presents with fever. He has a history of metastatic Burkitt's lymphoma on chemotherapy, last cycle on 01/26/2016. In usual state of health until this morning when he woke up feeling very fatigued. His wife states that he has been sleepy and low energy all day long. Also had fever of 102 Fahrenheit prior to arrival to ED. States that he has been having urinary urgency and sensation incomplete voiding. No abdominal pain or back pain, chest pain, difficulty breathing, cough, congestion, runny nose, nausea or vomiting, or diarrhea. Past Medical History:  Diagnosis Date  . Cancer ([place])    Lymphoma  . Hypertension   . PAD (peripheral artery disease) ([place]) left leg    Patient Active Problem List   Diagnosis Date Noted  . Sepsis ([place]) 02/06/2016  . Burkitt lymphoma of lymph nodes of multiple sites ([place]) 01/22/2016  . Neoplasm related pain   . Dental abscess   . Burkitt lymphoma of lymph nodes of multiple regions ([place]) 01/01/2016  . Encounter for antineoplastic chemotherapy   . Burkitt's lymphoma of lymph nodes of multiple regions ([place]) 12/25/2015    Past Surgical History:  Procedure Laterality Date  . IR GENERIC HISTORICAL  12/31/2015   IR FLUORO GUIDE CV LINE RIGHT 12/31/2015 Arne Cleveland, MD [facility]  . IR GENERIC HISTORICAL  12/31/2015   IR US GUIDE VASC ACCESS RIGHT 12/31/2015 Arne Cleveland, MD [facility]  . left salivary Left    removed       Home Medications    Prior to Admission medications   Medication Sig Start Date End Date Taking? Authorizing Provider  amLODipine (NORVASC) 10 MG tablet Take 10 mg by mouth every evening.  11/19/15  Yes Historical Provider, MD  Cholecalciferol (VITAMIN D3) 2000 units TABS Take  2,000 Units by mouth daily with lunch.    Yes Historical Provider, MD  clindamycin (CLEOCIN) 150 MG capsule Take 3 capsules (450 mg total) by mouth 3 (three) times daily. Started 08/14 for 10 days 01/27/16  Yes Brunetta Genera, MD  dexamethasone (DECADRON) 4 MG tablet Take 2 tablets (8 mg total) by mouth 2 (two) times daily with a meal. Take two times a day starting the day after chemotherapy for 3 days. 01/05/16  Yes Brunetta Genera, MD  lisinopril (PRINIVIL,ZESTRIL) 20 MG tablet Take 20 mg by mouth every evening.  11/19/15  Yes Historical Provider, MD  LORazepam (ATIVAN) 0.5 MG tablet Take 1 tablet (0.5 mg total) by mouth every 6 (six) hours as needed (Nausea or vomiting). Patient taking differently: Take 0.5 mg by mouth every 8 (eight) hours as needed for anxiety.  01/05/16  Yes Brunetta Genera, MD  magic mouthwash w/lidocaine SOLN Take 5 mLs by mouth 4 (four) times daily. 01/09/16  Yes Susanne Borders, NP  Multiple Vitamins-Minerals (CENTRUM SILVER PO) Take 1 tablet by mouth daily with breakfast.    Yes Historical Provider, MD  ondansetron (ZOFRAN) 8 MG tablet Take 1 tablet (8 mg total) by mouth 2 (two) times daily. Take two times a day starting the day after chemo for 3 days. Then take two times a day as needed for nausea or vomiting. 01/05/16  Yes Gautam Juleen China, MD  oxyCODONE (OXY IR/ROXICODONE) 5 MG immediate release tablet Take 1-2 tablets (5-10 mg total) by mouth every 6 (six) hours as needed for moderate pain or severe pain. 01/20/16  Yes Brunetta Genera, MD  polyethylene glycol [facility]) packet Take 17 g by mouth daily. Patient taking differently: Take 17 g by mouth daily as needed for moderate constipation.  12/18/15  Yes Brunetta Genera, MD  prochlorperazine (COMPAZINE) 10 MG tablet Take 1 tablet (10 mg total) by mouth every 6 (six) hours as needed (Nausea or vomiting). 01/05/16  Yes Brunetta Genera, MD  senna-docusate (SENNA S) 8.6-50 MG tablet Take 2 tablets by mouth at  bedtime. Patient taking differently: Take 2 tablets by mouth at bedtime as needed for moderate constipation.  12/18/15  Yes Brunetta Genera, MD  vitamin C (ASCORBIC ACID) 500 MG tablet Take 500 mg by mouth 2 (two) times daily.   Yes Historical Provider, MD  aspirin EC 81 MG tablet Take 81 mg by mouth at bedtime.    Historical Provider, MD  chlorhexidine (PERIDEX) 0.12 % solution Use as directed 15 mLs in the mouth or throat 3 (three) times daily. Patient not taking: Reported on 02/06/2016 01/20/16   Brunetta Genera, MD  clopidogrel (PLAVIX) 75 MG tablet Take 75 mg by mouth at bedtime.     Historical Provider, MD    Family History History reviewed. No pertinent family history.  Social History Social History  Substance Use Topics  . Smoking status: Former Smoker    Packs/day: 2.00    Quit date: 12/22/2005  . Smokeless tobacco: Never Used  . Alcohol use Yes     Comment: occasionally     Allergies   Review of patient's allergies indicates no known allergies.   Review of Systems Review of Systems 10/14 systems reviewed and are negative other than those stated in the HPI  Physical Exam Updated Vital Signs BP (!) 100/54   Pulse 98   Temp 99.9 F (37.7 C) (Oral)   Resp 15   SpO2 90%   Physical Exam Physical Exam  Nursing note and vitals reviewed. Constitutional: listless, non-toxic, and in no acute distress Head: Normocephalic and atraumatic.  Mouth/Throat: Oropharynx is clear and dry.  Neck: Normal range of motion. Neck supple.  Cardiovascular: tachycardic rate and regular rhythm.   Pulmonary/Chest: [place]ort normal and breath sounds normal.  Abdominal: Soft. There is no tenderness. There is no rebound and no guarding.  Musculoskeletal: Normal range of motion.  Neurological: Alert, no facial droop, fluent speech, moves all extremities symmetrically Skin: Skin is warm and dry.  Psychiatric: Cooperative   ED Treatments / Results  Labs (all labs ordered are listed,  but only abnormal results are displayed) Labs Reviewed  COMPREHENSIVE METABOLIC PANEL - Abnormal; Notable for the following:       Result Value   Sodium 127 (*)    Chloride 93 (*)    Glucose, Bld 169 (*)    All other components within normal limits  URINALYSIS, ROUTINE W REFLEX MICROSCOPIC (NOT AT [facility]) - Abnormal; Notable for the following:    Leukocytes, UA SMALL (*)    All other components within normal limits  CBC WITH DIFFERENTIAL/PLATELET - Abnormal; Notable for the following:    WBC 19.1 (*)    RBC 3.31 (*)    Hemoglobin 9.1 (*)    HCT 27.2 (*)    RDW 17.6 (*)    Platelets 138 (*)    Neutro Abs 16.4 (*)  Monocytes Absolute 1.7 (*)    All other components within normal limits  URINE MICROSCOPIC-ADD ON - Abnormal; Notable for the following:    Bacteria, UA RARE (*)    All other components within normal limits  CULTURE, BLOOD (ROUTINE X 2)  CULTURE, BLOOD (ROUTINE X 2)  URINE CULTURE  I-STAT CG4 LACTIC ACID, ED  I-STAT CG4 LACTIC ACID, ED    EKG  EKG Interpretation None       Radiology Dg Chest 2 View  Result Date: 02/06/2016 CLINICAL DATA:  Fevers. EXAM: CHEST  2 VIEW COMPARISON:  7/20/ 17 FINDINGS: There is a right chest wall port a catheter with tip in the cavoatrial junction. Normal heart size. Lung volumes are low. No pleural effusion or edema. No airspace consolidation. Asymmetric elevation of the right hemidiaphragm is again noted. IMPRESSION: No active cardiopulmonary disease. Electronically Signed   By: Kerby Moors M.D.   On: 02/06/2016 20:39    Procedures Procedures (including critical care time)  Medications Ordered in ED Medications  sodium chloride 0.9 % bolus 1,000 mL (1,000 mLs Intravenous New Bag/Given 02/06/16 2234)    And  sodium chloride 0.9 % bolus 1,000 mL (1,000 mLs Intravenous New Bag/Given 02/06/16 2234)    And  sodium chloride 0.9 % bolus 1,000 mL (1,000 mLs Intravenous New Bag/Given 02/06/16 2318)  cefTRIAXone (ROCEPHIN) 2 g in  dextrose 5 % 50 mL IVPB (not administered)  acetaminophen (TYLENOL) tablet 650 mg (650 mg Oral Given 02/06/16 2221)     Initial Impression / Assessment and Plan / ED Course  I have reviewed the triage vital signs and the nursing notes.  Pertinent labs & imaging results that were available during my care of the patient were reviewed by me and considered in my medical decision making (see chart for details).  Clinical Course  History of metastatic lymphoma on chemotherapy who presents with fever and chills. Has sepsis due to a urinary tract infection. Does have a fever of 100.8 Fahrenheit with tachycardia and oxygen requirement. Is normotensive. With normal lactate but leukocytosis of 19 with left shift. Also with hyponatremia hypochloremia. Did receive 3 L of IV fluids per sepsis protocol. UA suggestive of UTI and given ceftriaxone. Chest x-ray shows no infiltrate or other acute cardiopulmonary processes. Urine cultures also collected prior to antibiotics. Spoke with Dr. Lorin Mercy who will admit for ongoing management.  CRITICAL CARE Performed by: Forde Dandy   Total critical care time: 35 minutes  Critical care time was exclusive of separately billable procedures and treating other patients.  Critical care was necessary to treat or prevent imminent or life-threatening deterioration.  Critical care was time spent personally by me on the following activities: development of treatment plan with patient and/or surrogate as well as nursing, discussions with consultants, evaluation of patient's response to treatment, examination of patient, obtaining history from patient or surrogate, ordering and performing treatments and interventions, ordering and review of laboratory studies, ordering and review of radiographic studies, pulse oximetry and re-evaluation of patient's condition.   Final Clinical Impressions(s) / ED Diagnoses   Final diagnoses:  Sepsis, due to unspecified organism [facility])  UTI  (lower urinary tract infection)    New Prescriptions New Prescriptions   No medications on file     Forde Dandy, MD 02/06/16 2320

## 2016-02-07 DIAGNOSIS — R011 Cardiac murmur, unspecified: Secondary | ICD-10-CM | POA: Diagnosis present

## 2016-02-07 DIAGNOSIS — J9601 Acute respiratory failure with hypoxia: Secondary | ICD-10-CM

## 2016-02-07 DIAGNOSIS — A419 Sepsis, unspecified organism: Principal | ICD-10-CM

## 2016-02-07 DIAGNOSIS — E871 Hypo-osmolality and hyponatremia: Secondary | ICD-10-CM

## 2016-02-07 DIAGNOSIS — C8378 Burkitt lymphoma, lymph nodes of multiple sites: Secondary | ICD-10-CM

## 2016-02-07 HISTORY — DX: Cardiac murmur, unspecified: R01.1

## 2016-02-07 HISTORY — DX: Acute respiratory failure with hypoxia: J96.01

## 2016-02-07 LAB — CBC
HEMATOCRIT: 25.7 % — AB (ref 39.0–52.0)
HEMOGLOBIN: 8.3 g/dL — AB (ref 13.0–17.0)
MCH: 27.2 pg (ref 26.0–34.0)
MCHC: 32.3 g/dL (ref 30.0–36.0)
MCV: 84.3 fL (ref 78.0–100.0)
Platelets: 119 10*3/uL — ABNORMAL LOW (ref 150–400)
RBC: 3.05 MIL/uL — ABNORMAL LOW (ref 4.22–5.81)
RDW: 17.9 % — AB (ref 11.5–15.5)
WBC: 16.7 10*3/uL — ABNORMAL HIGH (ref 4.0–10.5)

## 2016-02-07 LAB — BASIC METABOLIC PANEL
Anion gap: 6 (ref 5–15)
BUN: 13 mg/dL (ref 6–20)
CHLORIDE: 101 mmol/L (ref 101–111)
CO2: 25 mmol/L (ref 22–32)
CREATININE: 0.78 mg/dL (ref 0.61–1.24)
Calcium: 8.1 mg/dL — ABNORMAL LOW (ref 8.9–10.3)
GFR calc Af Amer: >60 mL/min (ref >60)
GFR calc non Af Amer: >60 mL/min (ref >60)
GLUCOSE: 160 mg/dL — AB (ref 65–99)
Potassium: 4.2 mmol/L (ref 3.5–5.1)
Sodium: 132 mmol/L — ABNORMAL LOW (ref 135–145)

## 2016-02-07 LAB — PROTIME-INR
INR: 1.28
Prothrombin Time: 16 seconds — ABNORMAL HIGH (ref 11.4–15.2)

## 2016-02-07 LAB — PROCALCITONIN: PROCALCITONIN: 0.6 ng/mL

## 2016-02-07 LAB — LACTIC ACID, PLASMA
LACTIC ACID, VENOUS: 1.2 mmol/L (ref 0.5–1.9)
LACTIC ACID, VENOUS: 1 mmol/L (ref 0.5–1.9)

## 2016-02-07 LAB — APTT: aPTT: 45 seconds — ABNORMAL HIGH (ref 24–36)

## 2016-02-07 MED ORDER — AMLODIPINE BESYLATE 10 MG PO TABS
10.0000 mg | ORAL_TABLET | Freq: Every evening | ORAL | Status: DC
  Administered 2016-02-07: 10 mg via ORAL
  Filled 2016-02-07: qty 1

## 2016-02-07 MED ORDER — PIPERACILLIN-TAZOBACTAM 3.375 G IVPB
3.3750 g | Freq: Three times a day (TID) | INTRAVENOUS | Status: DC
  Administered 2016-02-07 – 2016-02-08 (×4): 3.375 g via INTRAVENOUS
  Filled 2016-02-07 (×4): qty 50

## 2016-02-07 MED ORDER — OXYCODONE HCL 5 MG PO TABS: 5.0000 mg | ORAL_TABLET | Freq: Four times a day (QID) | ORAL | Status: DC | PRN

## 2016-02-07 MED ORDER — PROCHLORPERAZINE MALEATE 10 MG PO TABS: 10.0000 mg | ORAL_TABLET | Freq: Four times a day (QID) | ORAL | Status: DC | PRN

## 2016-02-07 MED ORDER — SENNOSIDES-DOCUSATE SODIUM 8.6-50 MG PO TABS: 2.0000 | ORAL_TABLET | Freq: Every evening | ORAL | Status: DC | PRN

## 2016-02-07 MED ORDER — LORAZEPAM 0.5 MG PO TABS: 0.5000 mg | ORAL_TABLET | Freq: Three times a day (TID) | ORAL | Status: DC | PRN

## 2016-02-07 MED ORDER — VANCOMYCIN HCL IN DEXTROSE 1-5 GM/200ML-% IV SOLN
1000.0000 mg | Freq: Three times a day (TID) | INTRAVENOUS | Status: DC
  Administered 2016-02-07 – 2016-02-08 (×4): 1000 mg via INTRAVENOUS
  Filled 2016-02-07 (×4): qty 200

## 2016-02-07 MED ORDER — VANCOMYCIN HCL IN DEXTROSE 1-5 GM/200ML-% IV SOLN
1000.0000 mg | INTRAVENOUS | Status: AC
  Administered 2016-02-07: 1000 mg via INTRAVENOUS
  Filled 2016-02-07: qty 200

## 2016-02-07 MED ORDER — MAGIC MOUTHWASH W/LIDOCAINE
5.0000 mL | Freq: Four times a day (QID) | ORAL | Status: DC
  Administered 2016-02-07 – 2016-02-08 (×2): 5 mL via ORAL
  Filled 2016-02-07 (×7): qty 5

## 2016-02-07 MED ORDER — PIPERACILLIN-TAZOBACTAM 3.375 G IVPB 30 MIN
3.3750 g | INTRAVENOUS | Status: AC
  Administered 2016-02-07: 3.375 g via INTRAVENOUS
  Filled 2016-02-07: qty 50

## 2016-02-07 MED ORDER — ENOXAPARIN SODIUM 40 MG/0.4ML ~~LOC~~ SOLN
40.0000 mg | Freq: Every day | SUBCUTANEOUS | Status: DC
  Administered 2016-02-07 – 2016-02-08 (×2): 40 mg via SUBCUTANEOUS
  Filled 2016-02-07: qty 0.4

## 2016-02-07 MED ORDER — POLYETHYLENE GLYCOL 3350 17 G PO PACK: 17.0000 g | PACK | Freq: Every day | ORAL | Status: DC | PRN

## 2016-02-07 NOTE — Progress Notes (Signed)
PROGRESS NOTE    Erik Vaughn  ZJI:[id number] DOB: [birth year] DOA: 02/06/2016 PCP: Ernestene Kiel, MD   Outpatient Specialists:     Brief Narrative:  Erik Vaughn is a 65 y.o. male with medical history significant of Stage IVB Burkitt lymphoma. Tonight about 1600 he developed fever, chills, and lethargy.  Fever to 103.5 at home.  He went to the grocery earlier today and he came back very pale and extremely tired, somewhat agitated or antsy prior to fever starting, "trying to get warm."  Felt urgency but inability to void much.  Dysuria in ER.    Burkett's lymphoma - diagnosed <2 months ago.  Hip pain, MRI showed mass, PET with mets.  2 chemo treatments thus far.  His lymphoma involves "the sacrum causing sacral and root involvement with possible neurogenic bladder" which has improved prior to tonight.  He is currently holding Plavix in anticipation of possible intrathecal methotrexate for CNS prophylaxis on day 1 of his next cycle (scheduled for this coming Thursday).    Assessment & Plan:   Principal Problem:   Sepsis ([place]) Active Problems:   Burkitt lymphoma of lymph nodes of multiple regions [facility])   Neoplasm related pain   Murmur   Hyponatremia   Acute respiratory failure with hypoxia (HCC)   Sepsis -Elevated WBC count, fever, tachycardia with normal lactate but borderline hypotension -RLL with diminished breath sounds; CXR indicates persistent R hemidiaphragm elevation as cause and otherwise no acute disease -He is also immunocompromised due to his cancer and with recent hospitalization; with this clinical uncertainty will cover with Vanc/Zosyn for now -Blood and urine cultures pending -may need repeat x ray in AM  Burkitt lymphoma -Followed by Dr. Irene Limbo, who has been added to the treatment team -Scheduled for next round of chemotherapy to begin Thursday; this may need to be delayed  Diarrhea -check c diff -check GI pathogen panel  Murmur -Prior recent oncology  notes do not report a murmur -echo  Hyponatremia -This is acute and is likely hypovolemic in nature -repeat in AM   DVT prophylaxis:  Lovenox   Code Status: Full Code   Family Communication: Patient and wife  Disposition Plan:  inpt   Consultants:     Procedures:        Subjective: C/o diarrhea- watery-- started in ER Feeling better overall  Objective: Vitals:   02/06/16 2330 02/07/16 0030 02/07/16 0135 02/07/16 0526  BP: 99/55 113/60 (!) 100/59 (!) 103/50  Pulse: 92 94 94 82  Resp: '17 19 17 16  '$ Temp:   100 F (37.8 C) 98.4 F (36.9 C)  TempSrc:   Oral Oral  SpO2: (!) 89% 92% 96% 100%    Intake/Output Summary (Last 24 hours) at 02/07/16 1336 Last data filed at 02/07/16 0151  Gross per 24 hour  Intake             3050 ml  Output              200 ml  Net             2850 ml   There were no vitals filed for this visit.  Examination:  General exam: Appears calm and comfortable  Respiratory system: Clear to auscultation. Respiratory effort normal. Cardiovascular system: S1 & S2 heard, RRR. +murmur. No pedal edema. Gastrointestinal system: Abdomen is nondistended, soft and nontender. No organomegaly or masses felt. Normal bowel sounds heard. Central nervous system: Alert and oriented. No focal neurological deficits. Extremities: Symmetric 5 x 5 power.  Skin: No rashes, lesions or ulcers Psychiatry: Judgement and insight appear normal. Mood & affect appropriate.     Data Reviewed: I have personally reviewed following labs and imaging studies  CBC:  Recent Labs Lab 02/06/16 2040 02/07/16 0359  WBC 19.1* 16.7*  NEUTROABS 16.4*  --   HGB 9.1* 8.3*  HCT 27.2* 25.7*  MCV 82.2 84.3  PLT 138* 161*   Basic Metabolic Panel:  Recent Labs Lab 02/06/16 2039 02/07/16 0359  NA 127* 132*  K 4.3 4.2  CL 93* 101  CO2 25 25  GLUCOSE 169* 160*  BUN 18 13  CREATININE 0.89 0.78  CALCIUM 8.9 8.1*   GFR: CrCl cannot be calculated (Unknown  ideal weight.). Liver Function Tests:  Recent Labs Lab 02/06/16 2039  AST 20  ALT 19  ALKPHOS 96  BILITOT 0.7  PROT 6.6  ALBUMIN 3.5   No results for input(s): LIPASE, AMYLASE in the last 168 hours. No results for input(s): AMMONIA in the last 168 hours. Coagulation Profile:  Recent Labs Lab 02/07/16 0359  INR 1.28   Cardiac Enzymes: No results for input(s): CKTOTAL, CKMB, CKMBINDEX, TROPONINI in the last 168 hours. BNP (last 3 results) No results for input(s): PROBNP in the last 8760 hours. HbA1C: No results for input(s): HGBA1C in the last 72 hours. CBG: No results for input(s): GLUCAP in the last 168 hours. Lipid Profile: No results for input(s): CHOL, HDL, LDLCALC, TRIG, CHOLHDL, LDLDIRECT in the last 72 hours. Thyroid Function Tests: No results for input(s): TSH, T4TOTAL, FREET4, T3FREE, THYROIDAB in the last 72 hours. Anemia Panel: No results for input(s): VITAMINB12, FOLATE, FERRITIN, TIBC, IRON, RETICCTPCT in the last 72 hours. Urine analysis:    Component Value Date/Time   COLORURINE YELLOW 02/06/2016 2002   APPEARANCEUR CLEAR 02/06/2016 2002   LABSPEC 1.012 02/06/2016 2002   PHURINE 6.0 02/06/2016 2002   GLUCOSEU NEGATIVE 02/06/2016 2002   HGBUR NEGATIVE 02/06/2016 2002   BILIRUBINUR NEGATIVE 02/06/2016 2002   KETONESUR NEGATIVE 02/06/2016 2002   PROTEINUR NEGATIVE 02/06/2016 2002   NITRITE NEGATIVE 02/06/2016 2002   LEUKOCYTESUR SMALL (A) 02/06/2016 2002     ) Recent Results (from the past 240 hour(s))  Culture, blood (Routine X 2)     Status: None (Preliminary result)   Collection Time: 02/06/16  8:30 PM  Result Value Ref Range Status   Specimen Description BLOOD RIGHT ANTECUBITAL  Final   Special Requests BOTTLES DRAWN AEROBIC AND ANAEROBIC 5ML EA  Final   Culture   Final    NO GROWTH < 12 HOURS Performed at [facility]    Report Status PENDING  Incomplete      Anti-infectives    Start     Dose/Rate Route Frequency Ordered Stop    02/07/16 0800  piperacillin-tazobactam (ZOSYN) IVPB 3.375 g     3.375 g 12.5 mL/hr over 240 Minutes Intravenous Every 8 hours 02/07/16 0044     02/07/16 0800  vancomycin (VANCOCIN) IVPB 1000 mg/200 mL premix     1,000 mg 200 mL/hr over 60 Minutes Intravenous Every 8 hours 02/07/16 0044     02/07/16 0045  piperacillin-tazobactam (ZOSYN) IVPB 3.375 g     3.375 g 100 mL/hr over 30 Minutes Intravenous STAT 02/07/16 0044 02/07/16 0213   02/07/16 0045  vancomycin (VANCOCIN) IVPB 1000 mg/200 mL premix     1,000 mg 200 mL/hr over 60 Minutes Intravenous STAT 02/07/16 0044 02/07/16 0243   02/06/16 2315  cefTRIAXone (ROCEPHIN) 2 g in dextrose 5 %  50 mL IVPB  Status:  Discontinued     2 g 100 mL/hr over 30 Minutes Intravenous Every 24 hours 02/06/16 2307 02/07/16 0027       Radiology Studies: Dg Chest 2 View  Result Date: 02/06/2016 CLINICAL DATA:  Fevers. EXAM: CHEST  2 VIEW COMPARISON:  7/20/ 17 FINDINGS: There is a right chest wall port a catheter with tip in the cavoatrial junction. Normal heart size. Lung volumes are low. No pleural effusion or edema. No airspace consolidation. Asymmetric elevation of the right hemidiaphragm is again noted. IMPRESSION: No active cardiopulmonary disease. Electronically Signed   By: Kerby Moors M.D.   On: 02/06/2016 20:39        Scheduled Meds: . amLODipine  10 mg Oral QPM  . magic mouthwash w/lidocaine  5 mL Oral QID  . piperacillin-tazobactam (ZOSYN)  IV  3.375 g Intravenous Q8H  . vancomycin  1,000 mg Intravenous Q8H   Continuous Infusions:    LOS: 1 day    Time spent: 25 min    Stanley, DO [facility] Pager [phone]  If 7PM-7AM, please contact night-coverage [email or web address] Password [email or web address] 02/07/2016, 1:36 PM

## 2016-02-07 NOTE — Progress Notes (Signed)
Pharmacy Antibiotic Note  Erik Vaughn is a 65 y.o. male admitted on 02/06/2016 with sepsis.  PMH includes Burkitt's lymphoma and undergoing chemotherapy (last chemo 01/26/16). Patient received Ceftriaxone 2gm IV x 1 in the ED.  Pharmacy has been consulted for Vancomycin & Zosyn dosing.  Plan: Vancomycin 1 gm IV every 8 hours.  Goal trough 15-20 mcg/mL. Zosyn 3.375g IV q8h (4 hour infusion).     Temp (24hrs), Avg:99.9 F (37.7 C), Min:99.9 F (37.7 C), Max:99.9 F (37.7 C)   Recent Labs Lab 02/06/16 2039 02/06/16 2040 02/06/16 2052  WBC  --  19.1*  --   CREATININE 0.89  --   --   LATICACIDVEN  --   --  0.92    Estimated Creatinine Clearance: 93.2 mL/min (by C-G formula based on SCr of 0.89 mg/dL).    No Known Allergies  Antimicrobials this admission: 9/8 Ceftriaxone x 1 9/9 Vanc >>   9/9 Zosyn >>  Dose adjustments this admission:   Microbiology results: 9/8 BCx: sent 9/8 UCx: sent   Thank you for allowing pharmacy to be a part of this patient's care.  Everette Rank, PharmD 02/07/2016 12:45 AM

## 2016-02-08 ENCOUNTER — Other Ambulatory Visit (HOSPITAL_COMMUNITY): Payer: PRIVATE HEALTH INSURANCE

## 2016-02-08 DIAGNOSIS — J9601 Acute respiratory failure with hypoxia: Secondary | ICD-10-CM

## 2016-02-08 LAB — BASIC METABOLIC PANEL
Anion gap: 6 (ref 5–15)
BUN: 10 mg/dL (ref 6–20)
CO2: 28 mmol/L (ref 22–32)
Calcium: 8.5 mg/dL — ABNORMAL LOW (ref 8.9–10.3)
Chloride: 101 mmol/L (ref 101–111)
Creatinine, Ser: 0.83 mg/dL (ref 0.61–1.24)
GFR calc Af Amer: >60 mL/min (ref >60)
Glucose, Bld: 149 mg/dL — ABNORMAL HIGH (ref 65–99)
POTASSIUM: 3.9 mmol/L (ref 3.5–5.1)
SODIUM: 135 mmol/L (ref 135–145)

## 2016-02-08 LAB — CBC
HCT: 24.5 % — ABNORMAL LOW (ref 39.0–52.0)
Hemoglobin: 7.9 g/dL — ABNORMAL LOW (ref 13.0–17.0)
MCH: 26.8 pg (ref 26.0–34.0)
MCHC: 32.2 g/dL (ref 30.0–36.0)
MCV: 83.1 fL (ref 78.0–100.0)
PLATELETS: 121 10*3/uL — AB (ref 150–400)
RBC: 2.95 MIL/uL — AB (ref 4.22–5.81)
RDW: 17.7 % — ABNORMAL HIGH (ref 11.5–15.5)
WBC: 13.1 10*3/uL — AB (ref 4.0–10.5)

## 2016-02-08 LAB — URINE CULTURE

## 2016-02-08 MED ORDER — AMOXICILLIN-POT CLAVULANATE 875-125 MG PO TABS: 1.0000 | ORAL_TABLET | Freq: Two times a day (BID) | ORAL | 0 refills | Status: DC

## 2016-02-08 MED ORDER — SENNOSIDES-DOCUSATE SODIUM 8.6-50 MG PO TABS: 2.0000 | ORAL_TABLET | Freq: Every evening | ORAL | Status: DC | PRN

## 2016-02-08 MED ORDER — AMOXICILLIN-POT CLAVULANATE 875-125 MG PO TABS: 1.0000 | ORAL_TABLET | Freq: Two times a day (BID) | ORAL | Status: DC

## 2016-02-08 MED ORDER — POLYETHYLENE GLYCOL 3350 17 G PO PACK: 17.0000 g | PACK | Freq: Every day | ORAL | Status: DC | PRN

## 2016-02-08 NOTE — Progress Notes (Signed)
Periferal IV discontinued in Right arm, gauze dressing applied. Discharge instructions explained to pt and his wife, one prescription given to the pt. Discharged via wheelchair.

## 2016-02-08 NOTE — Discharge Summary (Signed)
Physician Discharge Summary  Erik Vaughn HEN:[id number] DOB: [birth year] DOA: 02/06/2016  PCP: Ernestene Kiel, MD  Admit date: 02/06/2016 Discharge date: 02/08/2016   Recommendations for Outpatient Follow-Up:     Discharge Diagnosis:   Principal Problem:   Sepsis ([place]) Active Problems:   Burkitt lymphoma of lymph nodes of multiple regions [facility])   Neoplasm related pain   Murmur   Hyponatremia   Acute respiratory failure with hypoxia [facility])   Discharge disposition:  Home  Discharge Condition: Improved.  Diet recommendation: regular  Wound care: None.   History of Present Illness:    Erik Vaughn is a 65 y.o. male with medical history significant of Stage IVB Burkitt lymphoma. Tonight about 1600 he developed fever, chills, and lethargy.  Fever to 103.5 at home.  He went to the grocery earlier today and he came back very pale and extremely tired, somewhat agitated or antsy prior to fever starting, "trying to get warm."  Felt urgency but inability to void much.  Dysuria in ER.    Burkett's lymphoma - diagnosed <2 months ago.  Hip pain, MRI showed mass, PET with mets.  2 chemo treatments thus far.  His lymphoma involves "the sacrum causing sacral and root involvement with possible neurogenic bladder" which has improved prior to tonight.  He is currently holding Plavix in anticipation of possible intrathecal methotrexate for CNS prophylaxis on day 1 of his next cycle (scheduled for this coming Thursday).    Hospital Course by Problem:   Sepsis -Elevated WBC count, fever, tachycardia with normallactate butborderline hypotension -RLL with diminished breath sounds; CXR indicates persistent R hemidiaphragm elevation as cause and otherwise no acute disease -He is also immunocompromised due to his cancer and with recent hospitalization- given vanc/zosyn -Blood and urine cultures negative -no source of infection and patient much improved with IVF (says in the days before, he   Did not drink much water) -augmentin x 5 more days after discussion with on call oncology--- patient to see Dr. Irene Limbo on Tuesday  Burkitt lymphoma -Followed by Dr. Irene Limbo, who has been added to the treatment team -to see Dr. Irene Limbo on tuesday  Diarrhea -resolved, no further episodes  Hyponatremia -This is acute and is likely hypovolemic in nature -improved with IVF  HTN -holding home BP meds   Medical Consultants:    None.   Discharge Exam:   Vitals:   02/07/16 2100 02/08/16 0607  BP: 102/64 112/62  Pulse: 99 76  Resp: 16 15  Temp: 98.3 F (36.8 C) 98.3 F (36.8 C)   Vitals:   02/07/16 1510 02/07/16 1719 02/07/16 2100 02/08/16 0607  BP:  116/63 102/64 112/62  Pulse:  88 99 76  Resp:   16 15  Temp:  98.7 F (37.1 C) 98.3 F (36.8 C) 98.3 F (36.8 C)  TempSrc:   Oral Oral  SpO2: 100% 97% 96% 96%    Gen:  NAD    The results of significant diagnostics from this hospitalization (including imaging, microbiology, ancillary and laboratory) are listed below for reference.     Procedures and Diagnostic Studies:   Dg Chest 2 View  Result Date: 02/06/2016 CLINICAL DATA:  Fevers. EXAM: CHEST  2 VIEW COMPARISON:  7/20/ 17 FINDINGS: There is a right chest wall port a catheter with tip in the cavoatrial junction. Normal heart size. Lung volumes are low. No pleural effusion or edema. No airspace consolidation. Asymmetric elevation of the right hemidiaphragm is again noted. IMPRESSION: No active cardiopulmonary disease. Electronically Signed  By: Kerby Moors M.D.   On: 02/06/2016 20:39     Labs:   Basic Metabolic Panel:  Recent Labs Lab 02/06/16 2039 02/07/16 0359 02/08/16 0402  NA 127* 132* 135  K 4.3 4.2 3.9  CL 93* 101 101  CO2 '25 25 28  '$ GLUCOSE 169* 160* 149*  BUN '18 13 10  '$ CREATININE 0.89 0.78 0.83  CALCIUM 8.9 8.1* 8.5*   GFR CrCl cannot be calculated (Unknown ideal weight.). Liver Function Tests:  Recent Labs Lab 02/06/16 2039  AST 20    ALT 19  ALKPHOS 96  BILITOT 0.7  PROT 6.6  ALBUMIN 3.5   No results for input(s): LIPASE, AMYLASE in the last 168 hours. No results for input(s): AMMONIA in the last 168 hours. Coagulation profile  Recent Labs Lab 02/07/16 0359  INR 1.28    CBC:  Recent Labs Lab 02/06/16 2040 02/07/16 0359 02/08/16 0402  WBC 19.1* 16.7* 13.1*  NEUTROABS 16.4*  --   --   HGB 9.1* 8.3* 7.9*  HCT 27.2* 25.7* 24.5*  MCV 82.2 84.3 83.1  PLT 138* 119* 121*   Cardiac Enzymes: No results for input(s): CKTOTAL, CKMB, CKMBINDEX, TROPONINI in the last 168 hours. BNP: Invalid input(s): POCBNP CBG: No results for input(s): GLUCAP in the last 168 hours. D-Dimer No results for input(s): DDIMER in the last 72 hours. Hgb A1c No results for input(s): HGBA1C in the last 72 hours. Lipid Profile No results for input(s): CHOL, HDL, LDLCALC, TRIG, CHOLHDL, LDLDIRECT in the last 72 hours. Thyroid function studies No results for input(s): TSH, T4TOTAL, T3FREE, THYROIDAB in the last 72 hours.  Invalid input(s): FREET3 Anemia work up No results for input(s): VITAMINB12, FOLATE, FERRITIN, TIBC, IRON, RETICCTPCT in the last 72 hours. Microbiology Recent Results (from the past 240 hour(s))  Urine culture     Status: Abnormal   Collection Time: 02/06/16  8:02 PM  Result Value Ref Range Status   Specimen Description URINE, CLEAN CATCH  Final   Special Requests NONE  Final   Culture (A)  Final    9,000 COLONIES/mL INSIGNIFICANT GROWTH Performed at [facility]    Report Status 02/08/2016 FINAL  Final  Culture, blood (Routine X 2)     Status: None (Preliminary result)   Collection Time: 02/06/16  8:30 PM  Result Value Ref Range Status   Specimen Description BLOOD RIGHT ANTECUBITAL  Final   Special Requests BOTTLES DRAWN AEROBIC AND ANAEROBIC 5ML EA  Final   Culture   Final    NO GROWTH < 12 HOURS Performed at [facility]    Report Status PENDING  Incomplete     Discharge  Instructions:   Discharge Instructions    Diet general    Complete by:  As directed   Discharge instructions    Complete by:  As directed   Cbc, bmp Tuesday with Dr. Irene Limbo   Increase activity slowly    Complete by:  As directed       Medication List    STOP taking these medications   amLODipine 10 MG tablet Commonly known as:  NORVASC   aspirin EC 81 MG tablet   clopidogrel 75 MG tablet Commonly known as:  PLAVIX   lisinopril 20 MG tablet Commonly known as:  PRINIVIL,ZESTRIL     TAKE these medications   amoxicillin-clavulanate 875-125 MG tablet Commonly known as:  AUGMENTIN Take 1 tablet by mouth every 12 (twelve) hours.   CENTRUM SILVER PO Take 1 tablet by  mouth daily with breakfast.   dexamethasone 4 MG tablet Commonly known as:  DECADRON Take 2 tablets (8 mg total) by mouth 2 (two) times daily with a meal. Take two times a day starting the day after chemotherapy for 3 days.   LORazepam 0.5 MG tablet Commonly known as:  ATIVAN Take 1 tablet (0.5 mg total) by mouth every 6 (six) hours as needed (Nausea or vomiting). What changed:  when to take this  reasons to take this   magic mouthwash w/lidocaine Soln Take 5 mLs by mouth 4 (four) times daily.   ondansetron 8 MG tablet Commonly known as:  ZOFRAN Take 1 tablet (8 mg total) by mouth 2 (two) times daily. Take two times a day starting the day after chemo for 3 days. Then take two times a day as needed for nausea or vomiting.   oxyCODONE 5 MG immediate release tablet Commonly known as:  Oxy IR/ROXICODONE Take 1-2 tablets (5-10 mg total) by mouth every 6 (six) hours as needed for moderate pain or severe pain.   polyethylene glycol packet Commonly known as:  MIRALAX Take 17 g by mouth daily as needed for moderate constipation.   prochlorperazine 10 MG tablet Commonly known as:  COMPAZINE Take 1 tablet (10 mg total) by mouth every 6 (six) hours as needed (Nausea or vomiting).   senna-docusate 8.6-50 MG  tablet Commonly known as:  SENNA S Take 2 tablets by mouth at bedtime as needed for moderate constipation.   vitamin C 500 MG tablet Commonly known as:  ASCORBIC ACID Take 500 mg by mouth 2 (two) times daily.   Vitamin D3 2000 units Tabs Take 2,000 Units by mouth daily with lunch.      Follow-up Information    PROCHNAU,CAROLINE, MD Follow up in 1 week(s).   Specialty:  Internal Medicine Contact information: [address] [place] [place] [address] [place], MD .   Specialties:  Hematology, Oncology Why:  tuesday as before Contact information: [place] [address] [phone]            Time coordinating discharge: 35 min  Signed:  JESSICA Alison Stalling   [facility] 02/08/2016, 12:15 PM

## 2016-02-10 ENCOUNTER — Other Ambulatory Visit (HOSPITAL_BASED_OUTPATIENT_CLINIC_OR_DEPARTMENT_OTHER): Payer: PRIVATE HEALTH INSURANCE

## 2016-02-10 ENCOUNTER — Ambulatory Visit (HOSPITAL_BASED_OUTPATIENT_CLINIC_OR_DEPARTMENT_OTHER): Payer: PRIVATE HEALTH INSURANCE | Admitting: Hematology

## 2016-02-10 ENCOUNTER — Encounter: Payer: Self-pay | Admitting: Hematology

## 2016-02-10 ENCOUNTER — Telehealth: Payer: Self-pay | Admitting: Hematology

## 2016-02-10 VITALS — BP 151/78 | HR 77 | Temp 97.9°F | Resp 17 | Ht 72.0 in | Wt 191.0 lb

## 2016-02-10 DIAGNOSIS — C8378 Burkitt lymphoma, lymph nodes of multiple sites: Secondary | ICD-10-CM

## 2016-02-10 DIAGNOSIS — M7989 Other specified soft tissue disorders: Secondary | ICD-10-CM | POA: Diagnosis not present

## 2016-02-10 LAB — CBC & DIFF AND RETIC
BASO%: 0.6 % (ref 0.0–2.0)
Basophils Absolute: 0 10*3/uL (ref 0.0–0.1)
EOS%: 0.6 % (ref 0.0–7.0)
Eosinophils Absolute: 0 10*3/uL (ref 0.0–0.5)
HEMATOCRIT: 29.2 % — AB (ref 38.4–49.9)
HGB: 9.4 g/dL — ABNORMAL LOW (ref 13.0–17.1)
Immature Retic Fract: 11.6 % — ABNORMAL HIGH (ref 3.00–10.60)
LYMPH%: 14.6 % (ref 14.0–49.0)
MCH: 26.5 pg — AB (ref 27.2–33.4)
MCHC: 32.2 g/dL (ref 32.0–36.0)
MCV: 82.3 fL (ref 79.3–98.0)
MONO#: 0.8 10*3/uL (ref 0.1–0.9)
MONO%: 11.6 % (ref 0.0–14.0)
NEUT%: 72.6 % (ref 39.0–75.0)
NEUTROS ABS: 5.2 10*3/uL (ref 1.5–6.5)
PLATELETS: 269 10*3/uL (ref 140–400)
RBC: 3.55 10*6/uL — AB (ref 4.20–5.82)
RDW: 17.2 % — ABNORMAL HIGH (ref 11.0–14.6)
Retic %: 1.11 % (ref 0.80–1.80)
Retic Ct Abs: 39.41 10*3/uL (ref 34.80–93.90)
WBC: 7.1 10*3/uL (ref 4.0–10.3)
lymph#: 1 10*3/uL (ref 0.9–3.3)
nRBC: 0 % (ref 0–0)

## 2016-02-10 LAB — COMPREHENSIVE METABOLIC PANEL
ALBUMIN: 2.8 g/dL — AB (ref 3.5–5.0)
ALK PHOS: 93 U/L (ref 40–150)
ALT: 24 U/L (ref 0–55)
AST: 19 U/L (ref 5–34)
Anion Gap: 11 mEq/L (ref 3–11)
BILIRUBIN TOTAL: 0.31 mg/dL (ref 0.20–1.20)
BUN: 15.8 mg/dL (ref 7.0–26.0)
CO2: 26 meq/L (ref 22–29)
Calcium: 9.5 mg/dL (ref 8.4–10.4)
Chloride: 104 mEq/L (ref 98–109)
Creatinine: 1.2 mg/dL (ref 0.7–1.3)
EGFR: 64 mL/min/{1.73_m2} — ABNORMAL LOW (ref >90)
GLUCOSE: 110 mg/dL (ref 70–140)
Potassium: 4.5 mEq/L (ref 3.5–5.1)
Sodium: 141 mEq/L (ref 136–145)
TOTAL PROTEIN: 6.7 g/dL (ref 6.4–8.3)

## 2016-02-10 LAB — LACTATE DEHYDROGENASE: LDH: 238 U/L (ref 125–245)

## 2016-02-10 MED ORDER — MIRTAZAPINE 15 MG PO TABS: 15.0000 mg | ORAL_TABLET | Freq: Every day | ORAL | 2 refills | Status: DC

## 2016-02-10 NOTE — Progress Notes (Signed)
Per Dr. Irene Limbo, pt set up for inpatient chemo admission on 9/18.  Floor RN and bed placement notified.

## 2016-02-10 NOTE — Telephone Encounter (Signed)
Avs report and schedule given per 02/10/16 los.

## 2016-02-11 LAB — CULTURE, BLOOD (ROUTINE X 2): CULTURE: NO GROWTH

## 2016-02-12 LAB — CULTURE, BLOOD (ROUTINE X 2): Culture: NO GROWTH

## 2016-02-15 NOTE — Progress Notes (Signed)
[name]    HEMATOLOGY/ONCOLOGY CLINIC NOTE  Date of Service: 02/10/2016  Patient Care Team: Ernestene Kiel, MD as PCP - General (Internal Medicine) [name], MD as Consulting Physician (Orthopedic Surgery)  CHIEF COMPLAINTS/PURPOSE OF CONSULTATION:  F/u for Burkitt's lymphoma  DIAGNOSIS Stage IVB Burkitt's lymphoma with t(8;14) translocation diagnosed 12/23/2015. This appears to be involving the sacrum causing sacral and root involvement with possible neurogenic bladder. PET CT scan as noted above shows extensive involvement with lymphoma. CSF negative for involvement. Bone marrow biopsy negative for involvement by lymphoma.   CURRENT TREATMENT The patient has completed 2 cycles of EPOCH -R  Planned treatment 6 cycles of EPOCH-R + IT methotrexate x 4 doses starting with cycle 3 for CNS prophylaxis   HISTORY OF PRESENTING ILLNESS:  Please see my previous note for details  INTERVAL HISTORY  Erik Vaughn is here for a follow-up with his wife. He was admitted to the hospital from 9/8 through 02/08/2016 with fever to 103.71F without neutropenia.  He noted some dysuria in the emergency room but no other focal symptoms. He was apparently treated with vancomycin and Zosyn with rapid resolution of his fever.  No source of infection was noted and the patient felt much better with IV fluids.  He was discharged on Augmentin for an additional 5 days.  He notes no further fever and feels like she is back to his baseline. He has had some mild diarrhea which has resolved.  Notes some bilateral lower extremity swelling for which he previously had an ultrasound in July 2017 which was negative for DVT.  Recommended use compression socks.  Patient has been going to the grocery store and visiting a few crowded places and we reiterated the importance of strict infection control precautions in the setting of immunocompromise from aggressive chemotherapy.  He shall be completing his Augmentin on  Saturday and wish to delay his cycle of chemotherapy for a few days and start on Monday 02/16/2016. No dental pain.  Nor recurrent dental abscess. Patient notes some issues with insomnia and would like something for this.  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   He smokes about 2 packs a day for 40 years.  SURGICAL HISTORY:  #1 left submandibular salivary gland benign tumor excision #2 port placement - 12/31/2015  SOCIAL HISTORY: Social History   Social History  . Marital status: Married    Spouse name: N/A  . Number of children: N/A  . Years of education: N/A   Occupational History  . meter specialist    Social History Main Topics  . Smoking status: Former Smoker    Packs/day: 2.00    Years: 16.00    Quit date: 12/22/2005  . Smokeless tobacco: Never Used  . Alcohol use Yes     Comment: occasionally  . Drug use: No  . Sexual activity: No   Other Topics Concern  . Not on file   Social History Narrative  . No narrative on file  smoked 2 packs of cigarettes per day for 40 years quit about 8-9 years ago. Denies significant alcohol use. 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  FAMILY HISTORY:  Sister had  breast cancer at age 79 years. Unknown if she had genetic testing One maternal uncle had melanoma Second maternal uncle had pancreatic cancer under the age of 37yrPaternal uncle with prostate cancer Dad had prostate cancer at age 7084years   ALLERGIES:  has No Known Allergies.  MEDICATIONS:  Current Outpatient Prescriptions  Medication Sig Dispense Refill  . amoxicillin-clavulanate (AUGMENTIN) 875-125 MG  tablet Take 1 tablet by mouth every 12 (twelve) hours. 12 tablet 0  . Cholecalciferol (VITAMIN D3) 2000 units TABS Take 2,000 Units by mouth daily with lunch.     . dexamethasone (DECADRON) 4 MG tablet Take 2 tablets (8 mg total) by mouth 2 (two) times daily with a meal. Take two times a day starting the day after chemotherapy for 3 days. 30 tablet 1  . LORazepam (ATIVAN) 0.5 MG tablet Take 1 tablet (0.5 mg total) by mouth every 6 (six) hours as needed (Nausea or vomiting). (Patient taking differently: Take 0.5 mg by mouth every 8 (eight) hours as needed for anxiety. ) 30 tablet 0  . magic mouthwash w/lidocaine SOLN Take 5 mLs by mouth 4 (four) times daily. 120 mL 0  . Multiple Vitamins-Minerals (CENTRUM SILVER PO) Take 1 tablet by mouth daily with breakfast.     . ondansetron (ZOFRAN) 8 MG tablet Take 1 tablet (8 mg total) by mouth 2 (two) times daily. Take two times a day starting the day after chemo for 3 days. Then take two times a day as needed for nausea or vomiting. 30 tablet 1  . oxyCODONE (OXY IR/ROXICODONE) 5 MG immediate release tablet Take 1-2 tablets (5-10 mg total) by mouth every 6 (six) hours as needed for moderate pain or severe pain. 60 tablet 0  . polyethylene glycol (MIRALAX) packet Take 17 g by mouth daily as needed for moderate constipation.    . prochlorperazine (COMPAZINE) 10 MG tablet Take 1 tablet (10 mg total) by mouth every 6 (six) hours as needed (Nausea or vomiting). 30 tablet 1  . senna-docusate (SENNA S) 8.6-50 MG tablet Take 2 tablets by mouth at bedtime as needed for moderate constipation.    . vitamin C (ASCORBIC ACID) 500 MG tablet Take 500 mg by mouth 2 (two) times daily.    . mirtazapine (REMERON) 15 MG tablet Take 1 tablet (15 mg total) by mouth at bedtime. 30 tablet 2   No current facility-administered medications for this visit.     REVIEW OF SYSTEMS:    10 Point review of Systems was done is negative except as noted above.  PHYSICAL EXAMINATION: ECOG  PERFORMANCE STATUS: 1 - Symptomatic but completely ambulatory  . Vitals:   02/10/16 1037  BP: (!) 151/78  Pulse: 77  Resp: 17  Temp: 97.9 F (36.6 C)   Filed Weights   02/10/16 1037  Weight: 191 lb (86.6 kg)   .Body mass index is 25.9 kg/m.  GENERAL:alert, in no acute distress and comfortable SKIN: skin color, texture, turgor are normal, no rashes or significant lesions EYES: normal, conjunctiva are pink and non-injected, sclera clear OROPHARYNX:no exudate, no erythema and lips, buccal mucosa, and tongue normal  NECK: supple, no JVD, thyroid normal size, non-tender, without nodularity LYMPH:  no palpable lymphadenopathy in the cervical, axillary or inguinal LUNGS: clear to auscultation with normal respiratory effort, large left anterior chest wall mass noted appears nontender and fixed. HEART: regular rate & rhythm,  no murmurs and no lower extremity edema ABDOMEN: abdomen soft, non-tender, normoactive bowel sounds  Musculoskeletal: no cyanosis  of digits and no clubbing  PSYCH: alert & oriented x 3 with fluent speech NEURO: no focal motor/sensory deficits  LABORATORY DATA:  I have reviewed the data as listed  . CBC Latest Ref Rng & Units 02/10/2016 02/08/2016 02/07/2016  WBC 4.0 - 10.3 10e3/uL 7.1 13.1(H) 16.7(H)  Hemoglobin 13.0 - 17.1 g/dL 9.4(L) 7.9(L) 8.3(L)  Hematocrit 38.4 - 49.9 % 29.2(L) 24.5(L) 25.7(L)  Platelets 140 - 400 10e3/uL 269 121(L) 119(L)   . CBC    Component Value Date/Time   WBC 7.1 02/10/2016 0934   WBC 13.1 (H) 02/08/2016 0402   RBC 3.55 (L) 02/10/2016 0934   RBC 2.95 (L) 02/08/2016 0402   HGB 9.4 (L) 02/10/2016 0934   HCT 29.2 (L) 02/10/2016 0934   PLT 269 02/10/2016 0934   MCV 82.3 02/10/2016 0934   MCH 26.5 (L) 02/10/2016 0934   MCH 26.8 02/08/2016 0402   MCHC 32.2 02/10/2016 0934   MCHC 32.2 02/08/2016 0402   RDW 17.2 (H) 02/10/2016 0934   LYMPHSABS 1.0 02/10/2016 0934   MONOABS 0.8 02/10/2016 0934   EOSABS 0.0 02/10/2016 0934    BASOSABS 0.0 02/10/2016 0934   . Lab Results  Component Value Date   LDH 238 02/10/2016   . CMP Latest Ref Rng & Units 02/10/2016 02/08/2016 02/07/2016  Glucose 70 - 140 mg/dl 110 149(H) 160(H)  BUN 7.0 - 26.0 mg/dL 15.8 10 13   Creatinine 0.7 - 1.3 mg/dL 1.2 0.83 0.78  Sodium 136 - 145 mEq/L 141 135 132(L)  Potassium 3.5 - 5.1 mEq/L 4.5 3.9 4.2  Chloride 101 - 111 mmol/L - 101 101  CO2 22 - 29 mEq/L 26 28 25   Calcium 8.4 - 10.4 mg/dL 9.5 8.5(L) 8.1(L)  Total Protein 6.4 - 8.3 g/dL 6.7 - -  Total Bilirubin 0.20 - 1.20 mg/dL 0.31 - -  Alkaline Phos 40 - 150 U/L 93 - -  AST 5 - 34 U/L 19 - -  ALT 0 - 55 U/L 24 - -   . Lab Results  Component Value Date   LDH 238 02/10/2016     RADIOGRAPHIC STUDIES: I have personally reviewed the radiological images as listed and agreed with the findings in the report. Dg Chest 2 View  Result Date: 02/06/2016 CLINICAL DATA:  Fevers. EXAM: CHEST  2 VIEW COMPARISON:  7/20/ 17 FINDINGS: There is a right chest wall port a catheter with tip in the cavoatrial junction. Normal heart size. Lung volumes are low. No pleural effusion or edema. No airspace consolidation. Asymmetric elevation of the right hemidiaphragm is again noted. IMPRESSION: No active cardiopulmonary disease. Electronically Signed   By: Kerby Moors M.D.   On: 02/06/2016 20:39    ASSESSMENT & PLAN:   65 year old Caucasian male with  #1 Stage IVB Burkitt's lymphoma with t(8;14) translocation This appears to be involving the sacrum causing sacral and root involvement with possible neurogenic bladder. PET CT scan as noted above shows extensive involvement with lymphoma. CSF negative for involvement. Bone marrow biopsy negative for involvement by lymphoma. Patient has completed cycle 2 of EPOCH-R and tolerated it well with no prohibitive toxicities. Grade 1 fatigue. Admitted to the hospital with fevers (non neutropenic) from 9/8 to 02/08/2016 -no clear source of infection.  Treated  empirically with antibiotics.  #2 right upper jaw odontogenic abscess resolved with clindamycin. Spontaneously drained. No current active symptoms at this time.  #3 normocytic anemia likely related to chemotherapy.  No evidence of bleeding. Plan -complete empiric Augmentin to finish  treatment of his recent hospitalization for fevers. -We shall delay his next cycle of chemotherapy to 02/16/2016.  We have requested for hospitalization for his third cycle of EPOCH-R from 02/16/2016. -He should also be set up for intrathecal methotrexate day 1 of his cycle by IR (for CNS prophylaxis). His Plavix has been on hold for nearly a week now. -Peridex mouth washes 3 times a day for a week and then twice daily. -patient was counseled on the need for absolute attention to infection prevention and to avoid crowds.  #4 left sacral ala and S1 fracture. L5-S1 and S1-S2 left-sided nerve root compression. #5 neoplasm related pain - well-controlled.  Minimal use of oxycodone. Plan -Pain well controlled. - when necessary oxycodone for intermittent neoplasm related pain. -Senna S and MiraLAX as needed. -SI joint brace and help with pain control.  #4 Hypertension, dyslipidemia, peripheral arterial disease -Continue management as per primary care physician.  #5 ex-smoker with 80-pack-year history of smoking.  Rpt labs on 02/16/2016 Inpatient admission for chemotherapy on 02/16/2016 Return to clinic in 4 weeks with repeat labs and PET/CT to evaluate treatment response.  I spent 30 minutes counseling the patient face to face. The total time spent in the appointment was 40 minutes and more than 50% was on counseling and direct patient cares and co-ordinating inpatient hospitalization logistics    Sullivan Lone MD [place] AAHIVMS [facility] Hematology/Oncology Physician [facility]  (Office):       [phone] (Work cell):  [phone] (Fax):           [phone]

## 2016-02-16 ENCOUNTER — Inpatient Hospital Stay (HOSPITAL_COMMUNITY)
Admission: RE | Admit: 2016-02-16 | Discharge: 2016-02-20 | DRG: 847 | Disposition: A | Discharge status: Home / Self Care | Payer: PRIVATE HEALTH INSURANCE | Source: Ambulatory Visit | Attending: Hematology | Admitting: Hematology

## 2016-02-16 ENCOUNTER — Encounter (HOSPITAL_COMMUNITY): Payer: Self-pay | Admitting: *Deleted

## 2016-02-16 ENCOUNTER — Inpatient Hospital Stay (HOSPITAL_COMMUNITY): Payer: PRIVATE HEALTH INSURANCE

## 2016-02-16 ENCOUNTER — Other Ambulatory Visit: Payer: Self-pay | Admitting: *Deleted

## 2016-02-16 DIAGNOSIS — R5383 Other fatigue: Secondary | ICD-10-CM | POA: Diagnosis present

## 2016-02-16 DIAGNOSIS — R509 Fever, unspecified: Secondary | ICD-10-CM | POA: Diagnosis not present

## 2016-02-16 DIAGNOSIS — C8378 Burkitt lymphoma, lymph nodes of multiple sites: Secondary | ICD-10-CM | POA: Diagnosis present

## 2016-02-16 DIAGNOSIS — E785 Hyperlipidemia, unspecified: Secondary | ICD-10-CM | POA: Diagnosis present

## 2016-02-16 DIAGNOSIS — D72829 Elevated white blood cell count, unspecified: Secondary | ICD-10-CM | POA: Diagnosis not present

## 2016-02-16 DIAGNOSIS — G893 Neoplasm related pain (acute) (chronic): Secondary | ICD-10-CM | POA: Diagnosis present

## 2016-02-16 DIAGNOSIS — T451X5A Adverse effect of antineoplastic and immunosuppressive drugs, initial encounter: Secondary | ICD-10-CM | POA: Diagnosis present

## 2016-02-16 DIAGNOSIS — I10 Essential (primary) hypertension: Secondary | ICD-10-CM | POA: Diagnosis present

## 2016-02-16 DIAGNOSIS — Z87891 Personal history of nicotine dependence: Secondary | ICD-10-CM

## 2016-02-16 DIAGNOSIS — D649 Anemia, unspecified: Secondary | ICD-10-CM

## 2016-02-16 DIAGNOSIS — K0510 Chronic gingivitis, plaque induced: Secondary | ICD-10-CM

## 2016-02-16 DIAGNOSIS — D6481 Anemia due to antineoplastic chemotherapy: Secondary | ICD-10-CM | POA: Diagnosis present

## 2016-02-16 DIAGNOSIS — I739 Peripheral vascular disease, unspecified: Secondary | ICD-10-CM | POA: Diagnosis present

## 2016-02-16 DIAGNOSIS — Z5111 Encounter for antineoplastic chemotherapy: Secondary | ICD-10-CM | POA: Diagnosis present

## 2016-02-16 LAB — CBC WITH DIFFERENTIAL/PLATELET
Basophils Absolute: 0.1 10*3/uL (ref 0.0–0.1)
Basophils Relative: 1 %
EOS ABS: 0.1 10*3/uL (ref 0.0–0.7)
EOS PCT: 1 %
HEMATOCRIT: 27.2 % — AB (ref 39.0–52.0)
HEMOGLOBIN: 8.8 g/dL — AB (ref 13.0–17.0)
Lymphocytes Relative: 13 %
Lymphs Abs: 1.3 10*3/uL (ref 0.7–4.0)
MCH: 26.3 pg (ref 26.0–34.0)
MCHC: 32.4 g/dL (ref 30.0–36.0)
MCV: 81.4 fL (ref 78.0–100.0)
MONO ABS: 0.6 10*3/uL (ref 0.1–1.0)
MONOS PCT: 6 %
NEUTROS PCT: 79 %
Neutro Abs: 8.6 10*3/uL — ABNORMAL HIGH (ref 1.7–7.7)
Platelets: 546 10*3/uL — ABNORMAL HIGH (ref 150–400)
RBC: 3.34 MIL/uL — AB (ref 4.22–5.81)
RDW: 16.8 % — ABNORMAL HIGH (ref 11.5–15.5)
WBC: 10.7 10*3/uL — ABNORMAL HIGH (ref 4.0–10.5)

## 2016-02-16 LAB — COMPREHENSIVE METABOLIC PANEL
ALK PHOS: 74 U/L (ref 38–126)
ALT: 18 U/L (ref 17–63)
AST: 20 U/L (ref 15–41)
Albumin: 3.1 g/dL — ABNORMAL LOW (ref 3.5–5.0)
Anion gap: 8 (ref 5–15)
BUN: 24 mg/dL — AB (ref 6–20)
CALCIUM: 9 mg/dL (ref 8.9–10.3)
CO2: 26 mmol/L (ref 22–32)
CREATININE: 1.1 mg/dL (ref 0.61–1.24)
Chloride: 103 mmol/L (ref 101–111)
GFR calc non Af Amer: >60 mL/min (ref >60)
GLUCOSE: 231 mg/dL — AB (ref 65–99)
Potassium: 3.8 mmol/L (ref 3.5–5.1)
SODIUM: 137 mmol/L (ref 135–145)
Total Bilirubin: 0.5 mg/dL (ref 0.3–1.2)
Total Protein: 6.3 g/dL — ABNORMAL LOW (ref 6.5–8.1)

## 2016-02-16 LAB — CSF CELL COUNT WITH DIFFERENTIAL
RBC Count, CSF: 1 /mm3 — ABNORMAL HIGH
Tube #: 1
WBC CSF: 1 /mm3 (ref 0–5)

## 2016-02-16 LAB — PHOSPHORUS: Phosphorus: 3.5 mg/dL (ref 2.5–4.6)

## 2016-02-16 LAB — LACTATE DEHYDROGENASE: LDH: 146 U/L (ref 98–192)

## 2016-02-16 LAB — GLUCOSE, CSF: GLUCOSE CSF: 70 mg/dL (ref 40–70)

## 2016-02-16 LAB — MAGNESIUM: MAGNESIUM: 1.7 mg/dL (ref 1.7–2.4)

## 2016-02-16 LAB — PROTEIN, CSF: TOTAL PROTEIN, CSF: 39 mg/dL (ref 15–45)

## 2016-02-16 MED ORDER — LISINOPRIL 20 MG PO TABS
20.0000 mg | ORAL_TABLET | Freq: Every day | ORAL | Status: DC
  Administered 2016-02-16 – 2016-02-20 (×5): 20 mg via ORAL
  Filled 2016-02-16 (×5): qty 1

## 2016-02-16 MED ORDER — OXYCODONE HCL 5 MG PO TABS: 5.0000 mg | ORAL_TABLET | Freq: Four times a day (QID) | ORAL | Status: DC | PRN

## 2016-02-16 MED ORDER — PREDNISONE 50 MG PO TABS
60.0000 mg/m2/d | ORAL_TABLET | Freq: Every day | ORAL | Status: AC
  Administered 2016-02-16 – 2016-02-20 (×5): 125.5 mg via ORAL
  Filled 2016-02-16 (×5): qty 3

## 2016-02-16 MED ORDER — SENNOSIDES-DOCUSATE SODIUM 8.6-50 MG PO TABS: 2.0000 | ORAL_TABLET | Freq: Every evening | ORAL | Status: DC | PRN

## 2016-02-16 MED ORDER — ALTEPLASE 2 MG IJ SOLR
2.0000 mg | Freq: Once | INTRAMUSCULAR | Status: DC | PRN
  Filled 2016-02-16: qty 2

## 2016-02-16 MED ORDER — ONDANSETRON HCL 4 MG/2ML IJ SOLN: 4.0000 mg | Freq: Three times a day (TID) | INTRAMUSCULAR | Status: DC | PRN

## 2016-02-16 MED ORDER — AMLODIPINE BESYLATE 10 MG PO TABS
10.0000 mg | ORAL_TABLET | Freq: Every day | ORAL | Status: DC
  Administered 2016-02-16 – 2016-02-20 (×5): 10 mg via ORAL
  Filled 2016-02-16 (×5): qty 1

## 2016-02-16 MED ORDER — COLD PACK MISC ONCOLOGY
1.0000 | Freq: Once | Status: DC | PRN
  Filled 2016-02-16: qty 1

## 2016-02-16 MED ORDER — ENOXAPARIN SODIUM 40 MG/0.4ML ~~LOC~~ SOLN
40.0000 mg | SUBCUTANEOUS | Status: DC
  Administered 2016-02-17 – 2016-02-19 (×3): 40 mg via SUBCUTANEOUS
  Filled 2016-02-16 (×3): qty 0.4

## 2016-02-16 MED ORDER — SODIUM CHLORIDE 0.9% FLUSH: 10.0000 mL | INTRAVENOUS | Status: DC | PRN

## 2016-02-16 MED ORDER — ALUM & MAG HYDROXIDE-SIMETH 200-200-20 MG/5ML PO SUSP: 60.0000 mL | ORAL | Status: DC | PRN

## 2016-02-16 MED ORDER — MIRTAZAPINE 15 MG PO TABS
15.0000 mg | ORAL_TABLET | Freq: Every day | ORAL | Status: DC
  Administered 2016-02-16: 15 mg via ORAL
  Filled 2016-02-16: qty 1

## 2016-02-16 MED ORDER — HOT PACK MISC ONCOLOGY
1.0000 | Freq: Once | Status: DC | PRN
  Filled 2016-02-16: qty 1

## 2016-02-16 MED ORDER — SODIUM CHLORIDE 0.9 % IJ SOLN
Freq: Once | INTRAMUSCULAR | Status: AC
  Administered 2016-02-16: 13:00:00 via INTRATHECAL
  Filled 2016-02-16: qty 0.48

## 2016-02-16 MED ORDER — SODIUM CHLORIDE 0.9 % IV SOLN
INTRAVENOUS | Status: DC
  Administered 2016-02-16: 13:00:00 via INTRAVENOUS

## 2016-02-16 MED ORDER — POLYETHYLENE GLYCOL 3350 17 G PO PACK: 17.0000 g | PACK | Freq: Every day | ORAL | Status: DC | PRN

## 2016-02-16 MED ORDER — DEXAMETHASONE 4 MG PO TABS: 8.0000 mg | ORAL_TABLET | Freq: Two times a day (BID) | ORAL | Status: DC

## 2016-02-16 MED ORDER — CHLORHEXIDINE GLUCONATE 0.12 % MT SOLN
15.0000 mL | Freq: Two times a day (BID) | OROMUCOSAL | Status: DC
  Administered 2016-02-16 – 2016-02-20 (×9): 15 mL via OROMUCOSAL
  Filled 2016-02-16 (×9): qty 15

## 2016-02-16 MED ORDER — ONDANSETRON HCL 4 MG PO TABS: 4.0000 mg | ORAL_TABLET | Freq: Three times a day (TID) | ORAL | Status: DC | PRN

## 2016-02-16 MED ORDER — LORAZEPAM 0.5 MG PO TABS: 0.5000 mg | ORAL_TABLET | Freq: Three times a day (TID) | ORAL | Status: DC | PRN

## 2016-02-16 MED ORDER — VINCRISTINE SULFATE CHEMO INJECTION 1 MG/ML
Freq: Once | INTRAVENOUS | Status: AC
  Administered 2016-02-16: 14:00:00 via INTRAVENOUS
  Filled 2016-02-16: qty 10

## 2016-02-16 MED ORDER — HEPARIN SOD (PORK) LOCK FLUSH 100 UNIT/ML IV SOLN
500.0000 [IU] | Freq: Once | INTRAVENOUS | Status: AC | PRN
Start: 2016-02-16 — End: 2016-02-20
  Administered 2016-02-20: 500 [IU]
  Filled 2016-02-16: qty 5

## 2016-02-16 MED ORDER — ONDANSETRON HCL 40 MG/20ML IJ SOLN
Freq: Once | INTRAMUSCULAR | Status: AC
  Administered 2016-02-16: 8 mg via INTRAVENOUS
  Filled 2016-02-16: qty 4

## 2016-02-16 MED ORDER — HEPARIN SOD (PORK) LOCK FLUSH 100 UNIT/ML IV SOLN: 250.0000 [IU] | Freq: Once | INTRAVENOUS | Status: DC | PRN

## 2016-02-16 MED ORDER — SODIUM CHLORIDE 0.9 % IV SOLN
8.0000 mg | Freq: Three times a day (TID) | INTRAVENOUS | Status: DC | PRN
  Filled 2016-02-16: qty 4

## 2016-02-16 MED ORDER — ONDANSETRON 4 MG PO TBDP: 4.0000 mg | ORAL_TABLET | Freq: Three times a day (TID) | ORAL | Status: DC | PRN

## 2016-02-16 MED ORDER — SODIUM CHLORIDE 0.9% FLUSH: 3.0000 mL | INTRAVENOUS | Status: DC | PRN

## 2016-02-16 MED ORDER — ACETAMINOPHEN 325 MG PO TABS: 650.0000 mg | ORAL_TABLET | ORAL | Status: DC | PRN

## 2016-02-16 NOTE — H&P (Signed)
[name]    HEMATOLOGY/ONCOLOGY H&P NOTE  Date of Service: 02/16/2016  Patient Care Team: Ernestene Kiel, MD as PCP - General (Internal Medicine) [name], MD as Consulting Physician (Orthopedic Surgery)  CHIEF COMPLAINTS/PURPOSE OF CONSULTATION:  Burkitts Lymphoma admitted for 3rd cycle of EPOCH-R  HISTORY OF PRESENTING ILLNESS:   Erik Vaughn is a wonderful 65 y.o. male who was recently seen in clinic and is being admitted for his 3rd cycle of EPOCH-R for Burkitt's lymphoma (with t(8;14) translocation).  He was seen in clinic on 02/10/2016 and was doing well. He was recently hospitalized with high-grade fevers from 9/8 to 02/08/2016 with fevers after having taken probiotics over-the-counter for a week. He was working in the yard visiting grocery stores and shops with potential exposure to infections. This was contrary to our recommendations to avoid crowns and exposure to infections. Patient has completed his course of antibiotics and currently has no fever and no focal symptoms suggestive of infection. He notes no other active symptoms. Feels ready to receive with his next treatment. He is aware and has consented to intrathecal methotrexate for CNS prophylaxis.   MEDICAL HISTORY:  Past Medical History:  Diagnosis Date  . Cancer ([place])    Burkett Lymphoma  . Hypertension   . 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   He smokes about 2 packs a day for 40 years.  SURGICAL HISTORY: Past Surgical History:  Procedure Laterality Date  . IR GENERIC HISTORICAL  12/31/2015   IR FLUORO GUIDE CV LINE RIGHT 12/31/2015 Arne Cleveland, MD [facility]INTER[facility]  . IR GENERIC HISTORICAL  12/31/2015   IR US GUIDE VASC ACCESS RIGHT 12/31/2015 Arne Cleveland, MD [facility]INTER[facility]  . left salivary  Left    removed    SOCIAL HISTORY: Social History   Social History  . Marital status: Married    Spouse name: N/A  . Number of children: N/A  . Years of education: N/A   Occupational History  . meter specialist    Social History Main Topics  . Smoking status: Former Smoker    Packs/day: 2.00    Years: 16.00    Quit date: 12/22/2005  . Smokeless tobacco: Never Used  . Alcohol use Yes     Comment: occasionally  . Drug use: No  . Sexual activity: No   Other Topics Concern  . Not on file   Social History Narrative  . No narrative on file    FAMILY HISTORY: Family History  Problem Relation Age of Onset  . Alzheimer's disease Father 65    ALLERGIES:  has No Known Allergies.  MEDICATIONS:  Current Facility-Administered Medications  Medication Dose Route Frequency Provider Last Rate Last Dose  . 0.9 %  sodium chloride infusion   Intravenous Continuous Brunetta Genera, MD      . acetaminophen (TYLENOL) tablet 650 mg  650 mg Oral Q4H PRN Brunetta Genera, MD      . alteplase (CATHFLO ACTIVASE) injection 2 mg  2 mg Intracatheter Once PRN Brunetta Genera, MD      . alum & mag hydroxide-simeth (MAALOX/MYLANTA) 200-200-20 MG/5ML suspension 60 mL  60 mL Oral Q4H PRN Brunetta Genera, MD      . amLODipine (NORVASC) tablet 10 mg  10 mg Oral Daily Cashis Rill Juleen China, MD      .  Cold Pack 1 packet  1 packet Topical Once PRN Brunetta Genera, MD      . dexamethasone (DECADRON) tablet 8 mg  8 mg Oral BID WC Brunetta Genera, MD      . DOXOrubicin (ADRIAMYCIN) 20 mg, etoposide (VEPESID) 104 mg, vinCRIStine (ONCOVIN) 0.8 mg in sodium chloride 0.9 % 500 mL chemo infusion   Intravenous Once Brunetta Genera, MD      . Derrill Memo ON 02/17/2016] enoxaparin (LOVENOX) injection 40 mg  40 mg Subcutaneous Q24H Brunetta Genera, MD      . heparin lock flush 100 unit/mL  500 Units Intracatheter Once PRN Brunetta Genera, MD      . heparin lock flush 100 unit/mL  250 Units  Intracatheter Once PRN Brunetta Genera, MD      . Hot Pack 1 packet  1 packet Topical Once PRN Brunetta Genera, MD      . lisinopril (PRINIVIL,ZESTRIL) tablet 20 mg  20 mg Oral Daily Gerald Kuehl Juleen China, MD      . LORazepam (ATIVAN) tablet 0.5 mg  0.5 mg Oral Q8H PRN Brunetta Genera, MD      . methotrexate (PF) 12 mg, hydrocortisone sodium succinate (SOLU-CORTEF) 50 mg in sodium chloride 0.9 % INTRATHECAL chemo injection   Intrathecal Once Brunetta Genera, MD      . mirtazapine (REMERON) tablet 15 mg  15 mg Oral QHS Brunetta Genera, MD      . ondansetron (ZOFRAN) tablet 4-8 mg  4-8 mg Oral Q8H PRN Brunetta Genera, MD       Or  . ondansetron (ZOFRAN-ODT) disintegrating tablet 4-8 mg  4-8 mg Oral Q8H PRN Brunetta Genera, MD       Or  . ondansetron (ZOFRAN) injection 4 mg  4 mg Intravenous Q8H PRN Brunetta Genera, MD       Or  . ondansetron (ZOFRAN) 8 mg in sodium chloride 0.9 % 50 mL IVPB  8 mg Intravenous Q8H PRN Brunetta Genera, MD      . ondansetron (ZOFRAN) 8 mg, dexamethasone (DECADRON) 10 mg in sodium chloride 0.9 % 50 mL IVPB   Intravenous Once Brunetta Genera, MD      . oxyCODONE (Oxy IR/ROXICODONE) immediate release tablet 5-10 mg  5-10 mg Oral Q6H PRN Brunetta Genera, MD      . polyethylene glycol (MIRALAX / GLYCOLAX) packet 17 g  17 g Oral Daily PRN Brunetta Genera, MD      . Derrill Memo ON 02/17/2016] predniSONE (DELTASONE) tablet 125.5 mg  60 mg/m2/day (Treatment Plan Recorded) Oral QAC breakfast Sheridan Gettel Juleen China, MD      . senna-docusate (Senokot-S) tablet 2 tablet  2 tablet Oral QHS PRN Brunetta Genera, MD      . sodium chloride flush (NS) 0.9 % injection 10 mL  10 mL Intracatheter PRN Brunetta Genera, MD      . sodium chloride flush (NS) 0.9 % injection 3 mL  3 mL Intravenous PRN Arjan Strohm Juleen China, MD        REVIEW OF SYSTEMS:    10 Point review of Systems was done is negative except as noted above.  PHYSICAL  EXAMINATION: ECOG PERFORMANCE STATUS: 1 - Symptomatic but completely ambulatory  . Vitals:   02/16/16 1019  BP: (!) 142/69  Pulse: 81  Resp: 16  Temp: 97.4 F (36.3 C)   GENERAL:alert, in no acute distress and comfortable SKIN: skin color, texture, turgor  are normal, no rashes or significant lesions EYES: normal, conjunctiva are pink and non-injected, sclera clear OROPHARYNX:no exudate, no erythema and lips, buccal mucosa, and tongue normal  NECK: supple, no JVD, thyroid normal size, non-tender, without nodularity LYMPH:  no palpable lymphadenopathy in the cervical, axillary or inguinal LUNGS: clear to auscultation with normal respiratory effort, large left anterior chest wall mass Not visualized anymore HEART: regular rate & rhythm,  no murmurs and no lower extremity edema ABDOMEN: abdomen soft, non-tender, normoactive bowel sounds  Musculoskeletal: no cyanosis of digits and no clubbing  PSYCH: alert & oriented x 3 with fluent speech NEURO: no focal motor/sensory deficits  LABORATORY DATA:  I have reviewed the data as listed  . CBC Latest Ref Rng & Units 02/10/2016 02/08/2016 02/07/2016  WBC 4.0 - 10.3 10e3/uL 7.1 13.1(H) 16.7(H)  Hemoglobin 13.0 - 17.1 g/dL 9.4(L) 7.9(L) 8.3(L)  Hematocrit 38.4 - 49.9 % 29.2(L) 24.5(L) 25.7(L)  Platelets 140 - 400 10e3/uL 269 121(L) 119(L)    CMP Latest Ref Rng & Units 02/10/2016 02/08/2016 02/07/2016  Glucose 70 - 140 mg/dl 110 149(H) 160(H)  BUN 7.0 - 26.0 mg/dL 15.8 10 13   Creatinine 0.7 - 1.3 mg/dL 1.2 0.83 0.78  Sodium 136 - 145 mEq/L 141 135 132(L)  Potassium 3.5 - 5.1 mEq/L 4.5 3.9 4.2  Chloride 101 - 111 mmol/L - 101 101  CO2 22 - 29 mEq/L 26 28 25   Calcium 8.4 - 10.4 mg/dL 9.5 8.5(L) 8.1(L)  Total Protein 6.4 - 8.3 g/dL 6.7 - -  Total Bilirubin 0.20 - 1.20 mg/dL 0.31 - -  Alkaline Phos 40 - 150 U/L 93 - -  AST 5 - 34 U/L 19 - -  ALT 0 - 55 U/L 24 - -   . Lab Results  Component Value Date   LDH 238 02/10/2016    RADIOGRAPHIC  STUDIES: I have personally reviewed the radiological images as listed and agreed with the findings in the report. Dg Chest 2 View  Result Date: 02/06/2016 CLINICAL DATA:  Fevers. EXAM: CHEST  2 VIEW COMPARISON:  7/20/ 17 FINDINGS: There is a right chest wall port a catheter with tip in the cavoatrial junction. Normal heart size. Lung volumes are low. No pleural effusion or edema. No airspace consolidation. Asymmetric elevation of the right hemidiaphragm is again noted. IMPRESSION: No active cardiopulmonary disease. Electronically Signed   By: Kerby Moors M.D.   On: 02/06/2016 20:39    ASSESSMENT & PLAN:   66 year old Caucasian male with  #1 Stage IVB Burkitt's lymphoma with t(8;14) translocation This appears to be involving the sacrum causing sacral and root involvement with possible neurogenic bladder. PET CT scan as noted above shows extensive involvement with lymphoma. CSF negative for involvement. Bone marrow biopsy negative for involvement by lymphoma. Patient has completed cycle 2 of EPOCH-R and tolerated it well with no prohibitive toxicities. Grade 1 fatigue.  #2  Admitted to the hospital with fevers (non neutropenic) from 9/8 to 02/08/2016 -no clear source of infection.  Treated empirically with antibiotics. Was again counseled to avoid taking probiotics and avoiding exposure to crowds dusts and fumes and other factors that could increase his risk of infections while on chemotherapy.  #3 right upper jaw odontogenic abscess resolved with clindamycin. Spontaneously drained. No current active symptoms at this time.  #4 normocytic anemia likely related to chemotherapy.  No evidence of bleeding. Plan -Patient has completed empiric Augmentin to finish treatment of his recent hospitalization for fevers. No current fevers or chills  or other focal symptoms of infection. -he will have repeat labs including CBC, CMP, LDH today -We will plan to proceed with his next cycle of EPOCH-R  chemotherapy from today. Rituxan day 5 -He will be getting a lumbar puncture by IR for intrathecal methotrexate today.(for CNS prophylaxis).  His ASA and Plavix has been on hold for more than a week now. We shall restart aspirin from tomorrow if no issues with bleeding and then Plavix little later. -Peridex mouth washes 2 times a day for a week and then twice daily. -patient was counseled on the need for absolute attention to infection prevention and to avoid crowds. -We'll need to follow up for Neulasta shot on Saturday 02/21/2016.  #4 left sacral ala and S1 fracture. L5-S1 and S1-S2 left-sided nerve root compression. #5 neoplasm related pain - well-controlled.  Minimal use of oxycodone. Plan -Pain well controlled. - when necessary oxycodone for intermittent neoplasm related pain.  -Senna S and MiraLAX as needed. -SI joint brace and help with pain control.  #4 Hypertension, dyslipidemia, peripheral arterial disease -Continue current outpatient medications -Aspirin and Plavix on hold for lumbar puncture. -Will restart aspirin first followed by Plavix if no issues with bleeding.  #5 ex-smoker with 80-pack-year history of smoking.  The total time spent in the appointment was 60 minutes and more than 50% was on counseling and direct patient cares and co-ordination of care with nursing and pharmacy.    Sullivan Lone MD [place] AAHIVMS [facility] Hematology/Oncology Physician [facility]  (Office):       [phone] (Work cell):  [phone] (Fax):           [phone]

## 2016-02-16 NOTE — Progress Notes (Addendum)
Chemotherapy dosage calculation for doxorubicin, etoposide, and vincristine verified with Lottie Dawson, RN.

## 2016-02-17 LAB — CBC
HCT: 28 % — ABNORMAL LOW (ref 39.0–52.0)
Hemoglobin: 9.1 g/dL — ABNORMAL LOW (ref 13.0–17.0)
MCH: 26.8 pg (ref 26.0–34.0)
MCHC: 32.5 g/dL (ref 30.0–36.0)
MCV: 82.4 fL (ref 78.0–100.0)
PLATELETS: 603 10*3/uL — AB (ref 150–400)
RBC: 3.4 MIL/uL — AB (ref 4.22–5.81)
RDW: 16.7 % — AB (ref 11.5–15.5)
WBC: 15.6 10*3/uL — AB (ref 4.0–10.5)

## 2016-02-17 LAB — COMPREHENSIVE METABOLIC PANEL
ALK PHOS: 70 U/L (ref 38–126)
ALT: 20 U/L (ref 17–63)
AST: 18 U/L (ref 15–41)
Albumin: 3.1 g/dL — ABNORMAL LOW (ref 3.5–5.0)
Anion gap: 9 (ref 5–15)
BILIRUBIN TOTAL: 0.4 mg/dL (ref 0.3–1.2)
BUN: 27 mg/dL — AB (ref 6–20)
CALCIUM: 9.3 mg/dL (ref 8.9–10.3)
CHLORIDE: 105 mmol/L (ref 101–111)
CO2: 24 mmol/L (ref 22–32)
CREATININE: 1.01 mg/dL (ref 0.61–1.24)
Glucose, Bld: 161 mg/dL — ABNORMAL HIGH (ref 65–99)
Potassium: 4.4 mmol/L (ref 3.5–5.1)
Sodium: 138 mmol/L (ref 135–145)
Total Protein: 6.5 g/dL (ref 6.5–8.1)

## 2016-02-17 MED ORDER — SODIUM CHLORIDE 0.9% FLUSH: 10.0000 mL | INTRAVENOUS | Status: DC | PRN

## 2016-02-17 MED ORDER — SODIUM CHLORIDE 0.9 % IV SOLN
Freq: Once | INTRAVENOUS | Status: AC
  Administered 2016-02-17: 8 mg via INTRAVENOUS
  Filled 2016-02-17: qty 4

## 2016-02-17 MED ORDER — ASPIRIN EC 81 MG PO TBEC
81.0000 mg | DELAYED_RELEASE_TABLET | Freq: Every day | ORAL | Status: DC
  Administered 2016-02-18 – 2016-02-20 (×3): 81 mg via ORAL
  Filled 2016-02-17 (×3): qty 1

## 2016-02-17 MED ORDER — VINCRISTINE SULFATE CHEMO INJECTION 1 MG/ML
Freq: Once | INTRAVENOUS | Status: AC
  Administered 2016-02-17: 14:00:00 via INTRAVENOUS
  Filled 2016-02-17: qty 10

## 2016-02-17 NOTE — Care Management Note (Signed)
Case Management Note  Patient Details  Name: Daviyon Widmayer MRN: [id number] Date of Birth: 07-24-1950  Subjective/Objective:    65 yo admitted with Burkitt's Lymphoma for chemotherapy.                Action/Plan: From home with spouse. Chart reviewed and no CM needs identified or communicated at this time. CM will continue to follow.  Expected Discharge Date:  02/21/16               Expected Discharge Plan:  Home/Self Care  In-House Referral:     Discharge planning Services  CM Consult  Post Acute Care Choice:    Choice offered to:     DME Arranged:    DME Agency:     HH Arranged:    HH Agency:     Status of Service:  In process, will continue to follow  If discussed at Long Length of Stay Meetings, dates discussed:    Additional CommentsLynnell Catalan, RN 02/17/2016, 10:46 AM  [phone]

## 2016-02-17 NOTE — Progress Notes (Signed)
Chemo dosages and calculations checked with Kirkland Hun RN. This RN checked dosages and calculations of chemo, Kirkland Hun

## 2016-02-17 NOTE — Progress Notes (Signed)
[name]   HEMATOLOGY/ONCOLOGY INPATIENT PROGRESS NOTE  Date of Service: 02/17/2016  Inpatient Attending: .Brunetta Genera, MD   SUBJECTIVE  Patient had an uneventful IT methotrexate injection yesterday. No headaches no back pain. No other acute new symptoms. No nausea no vomitting. Tolerating treatment well   OBJECTIVE:  NAD  PHYSICAL EXAMINATION: . Vitals:   02/16/16 2143 02/17/16 0621 02/17/16 1414 02/17/16 2150  BP: 128/69 (!) 147/78 136/62 (!) 147/66  Pulse: 95 86 92 88  Resp: 18 18 18 18   Temp: 98.8 F (37.1 C) 98.1 F (36.7 C) 98.3 F (36.8 C) 97.4 F (36.3 C)  TempSrc: Oral Oral Oral Oral  SpO2: 96% 96% 95% 99%  Weight:      Height:       Filed Weights   02/16/16 1410  Weight: 185 lb 6.4 oz (84.1 kg)   .Body mass index is 25.14 kg/m.  GENERAL:alert, in no acute distress and comfortable SKIN: skin color, texture, turgor are normal, no rashes or significant lesions EYES: normal, conjunctiva are pink and non-injected, sclera clear OROPHARYNX:no exudate, no erythema and lips, buccal mucosa, and tongue normal  NECK: supple, no JVD, thyroid normal size, non-tender, without nodularity LYMPH:  no palpable lymphadenopathy in the cervical, axillary or inguinal LUNGS: clear to auscultation with normal respiratory effort HEART: regular rate & rhythm,  no murmurs and no lower extremity edema ABDOMEN: abdomen soft, non-tender, normoactive bowel sounds  Musculoskeletal: no cyanosis of digits and no clubbing  PSYCH: alert & oriented x 3 with fluent speech NEURO: no focal motor/sensory deficits  MEDICAL HISTORY:  Past Medical History:  Diagnosis Date  . Cancer ([place])    Burkett Lymphoma  . Hypertension   . PAD (peripheral artery disease) ([facility]) left leg    SURGICAL HISTORY: Past Surgical History:  Procedure Laterality Date  . IR GENERIC HISTORICAL  12/31/2015   IR FLUORO GUIDE CV LINE RIGHT 12/31/2015 Arne Cleveland, MD [facility]  . IR GENERIC HISTORICAL   12/31/2015   IR US GUIDE VASC ACCESS RIGHT 12/31/2015 Arne Cleveland, MD [facility]  . left salivary Left    removed    SOCIAL HISTORY: Social History   Social History  . Marital status: Married    Spouse name: N/A  . Number of children: N/A  . Years of education: N/A   Occupational History  . meter specialist    Social History Main Topics  . Smoking status: Former Smoker    Packs/day: 2.00    Years: 16.00    Quit date: 12/22/2005  . Smokeless tobacco: Never Used  . Alcohol use Yes     Comment: occasionally  . Drug use: No  . Sexual activity: No   Other Topics Concern  . Not on file   Social History Narrative  . No narrative on file    FAMILY HISTORY: Family History  Problem Relation Age of Onset  . Alzheimer's disease Father 89    ALLERGIES:  has No Known Allergies.  MEDICATIONS:  Scheduled Meds: . amLODipine  10 mg Oral Daily  . chlorhexidine  15 mL Mouth/Throat BID  . DOXOrubicin/vinCRIStine/etoposide CHEMO IV infusion for Inpatient CI   Intravenous Once  . enoxaparin (LOVENOX) injection  40 mg Subcutaneous Q24H  . lisinopril  20 mg Oral Daily  . mirtazapine  15 mg Oral QHS  . predniSONE  60 mg/m2/day (Treatment Plan Recorded) Oral QAC breakfast   Continuous Infusions: . sodium chloride 10 mL/hr at 02/16/16 1239   PRN Meds:.acetaminophen, alteplase, alum &  mag hydroxide-simeth, Cold Pack, heparin lock flush, heparin lock flush, Hot Pack, LORazepam, ondansetron **OR** ondansetron **OR** ondansetron (ZOFRAN) IV **OR** ondansetron (ZOFRAN) IV, oxyCODONE, polyethylene glycol, senna-docusate, sodium chloride flush, sodium chloride flush, sodium chloride flush  REVIEW OF SYSTEMS:    10 Point review of Systems was done is negative except as noted above.   LABORATORY DATA:  I have reviewed the data as listed  . CBC Latest Ref Rng & Units 02/17/2016 02/16/2016 02/10/2016  WBC 4.0 - 10.5 K/uL 15.6(H) 10.7(H) 7.1  Hemoglobin 13.0 - 17.0 g/dL 9.1(L) 8.8(L)  9.4(L)  Hematocrit 39.0 - 52.0 % 28.0(L) 27.2(L) 29.2(L)  Platelets 150 - 400 K/uL 603(H) 546(H) 269    . CMP Latest Ref Rng & Units 02/17/2016 02/16/2016 02/10/2016  Glucose 65 - 99 mg/dL 161(H) 231(H) 110  BUN 6 - 20 mg/dL 27(H) 24(H) 15.8  Creatinine 0.61 - 1.24 mg/dL 1.01 1.10 1.2  Sodium 135 - 145 mmol/L 138 137 141  Potassium 3.5 - 5.1 mmol/L 4.4 3.8 4.5  Chloride 101 - 111 mmol/L 105 103 -  CO2 22 - 32 mmol/L 24 26 26   Calcium 8.9 - 10.3 mg/dL 9.3 9.0 9.5  Total Protein 6.5 - 8.1 g/dL 6.5 6.3(L) 6.7  Total Bilirubin 0.3 - 1.2 mg/dL 0.4 0.5 0.31  Alkaline Phos 38 - 126 U/L 70 74 93  AST 15 - 41 U/L 18 20 19   ALT 17 - 63 U/L 20 18 24      RADIOGRAPHIC STUDIES: I have personally reviewed the radiological images as listed and agreed with the findings in the report. Dg Chest 2 View  Result Date: 02/06/2016 CLINICAL DATA:  Fevers. EXAM: CHEST  2 VIEW COMPARISON:  7/20/ 17 FINDINGS: There is a right chest wall port a catheter with tip in the cavoatrial junction. Normal heart size. Lung volumes are low. No pleural effusion or edema. No airspace consolidation. Asymmetric elevation of the right hemidiaphragm is again noted. IMPRESSION: No active cardiopulmonary disease. Electronically Signed   By: Kerby Moors M.D.   On: 02/06/2016 20:39   Dg Fluoro Guided Loc Of Needle/cath Tip For Spinal Inject Rt  Result Date: 02/16/2016 CLINICAL DATA:  Lumbar puncture with chemotherapy injection, diagnostic and therapeutic. Burkitt's lymphoma. EXAM: FLUOROSCOPICALLY GUIDED LUMBAR PUNCTURE FOR INTRATHECAL CHEMOTHERAPY TECHNIQUE: Informed consent was obtained from the patient prior to the procedure, including potential complications of headache, allergy, and pain. A 'time out' was performed. With the patient prone, the lower back was prepped with Betadine. 1% Lidocaine was used for local anesthesia. Lumbar puncture was performed at the L2-3 using a 20 gauge needle with return of clear CSF. Opening pressure  14 cm of water. 10 cc of clear CSF was collected. 12 mg methotrexate with 50 mg hydrocortisone in a 10 mL solutionwas injected into the subarachnoid space. The patient tolerated the procedure well without apparent complication. FLUOROSCOPY TIME:  20 seconds IMPRESSION: Intrathecal injection of chemotherapy without complication Electronically Signed   By: Rolm Baptise M.D.   On: 02/16/2016 12:54   Dg Fluoro Guide Lumbar Puncture  Result Date: 02/16/2016 CLINICAL DATA:  Lumbar puncture with chemotherapy injection, diagnostic and therapeutic. Burkitt's lymphoma. EXAM: FLUOROSCOPICALLY GUIDED LUMBAR PUNCTURE FOR INTRATHECAL CHEMOTHERAPY TECHNIQUE: Informed consent was obtained from the patient prior to the procedure, including potential complications of headache, allergy, and pain. A 'time out' was performed. With the patient prone, the lower back was prepped with Betadine. 1% Lidocaine was used for local anesthesia. Lumbar puncture was performed at the L2-3 using  a 20 gauge needle with return of clear CSF. Opening pressure 14 cm of water. 10 cc of clear CSF was collected. 12 mg methotrexate with 50 mg hydrocortisone in a 10 mL solutionwas injected into the subarachnoid space. The patient tolerated the procedure well without apparent complication. FLUOROSCOPY TIME:  20 seconds IMPRESSION: Intrathecal injection of chemotherapy without complication Electronically Signed   By: Rolm Baptise M.D.   On: 02/16/2016 12:54    ASSESSMENT & PLAN:   65 year old Caucasian male with  #1 Stage IVB Burkitt's lymphoma with t(8;14) translocation This appears to be involving the sacrum causing sacral and root involvement with possible neurogenic bladder. PET CT scan as noted above shows extensive involvement with lymphoma. CSF negative for involvement. Bone marrow biopsy negative for involvement by lymphoma. Patient has completed cycle 2of EPOCH-R and tolerated it well with no prohibitive toxicities. Grade 1  fatigue.  #2  Admitted to the hospital with fevers (non neutropenic) from 9/8 to 02/08/2016 -no clear source of infection. Treated empirically with antibiotics. Was again counseled to avoid taking probiotics and avoiding exposure to crowds dusts and fumes and other factors that could increase his risk of infections while on chemotherapy.  #3 right upper jaw odontogenic abscess resolved with clindamycin. Spontaneously drained. No current active symptoms at this time.  #4 normocytic anemia likely related to chemotherapy. No evidence of bleeding. Plan - patient receiving c3D2 EPOCH-R. Tolerated IT methotrexate yesterday. -labs are stable. -continue daily cbc, bmp -restarting Aspirin tomorrow and plavix thereafter. -Peridex mouth washes 2 times a day for a week and then twice daily. -continue current treatment -We'll need to follow up for Neulasta shot on Saturday 02/21/2016. -he will get rpt PET/CT as outpatient prior to next cycle of chemotherapy.  #4left sacral ala and S1 fracture. L5-S1 and S1-S2 left-sided nerve root compression. #5neoplasm related pain - well-controlled. Minimal use of oxycodone. Plan -Pain well controlled. -when necessary oxycodone for intermittent neoplasm related pain.  -Senna S and MiraLAX as needed. -SI joint brace and help with pain control.  #4 Hypertension, dyslipidemia, peripheral arterial disease -Continue current outpatient medications -Will restart aspirin tommorrow followed by Plavix if no issues with bleeding.  #5 ex-smoker with 80-pack-year history of smoking.  I spent 35 minutes more than 50% was on counseling and direct patient cares.    Sullivan Lone MD [place] AAHIVMS [facility] Hematology/Oncology Physician [facility] [place]  (Office):       [phone] (Work cell):  [phone] (Fax):           [phone]

## 2016-02-18 LAB — CBC
HCT: 26.9 % — ABNORMAL LOW (ref 39.0–52.0)
HEMOGLOBIN: 8.7 g/dL — AB (ref 13.0–17.0)
MCH: 26.5 pg (ref 26.0–34.0)
MCHC: 32.3 g/dL (ref 30.0–36.0)
MCV: 82 fL (ref 78.0–100.0)
Platelets: 529 10*3/uL — ABNORMAL HIGH (ref 150–400)
RBC: 3.28 MIL/uL — AB (ref 4.22–5.81)
RDW: 17.1 % — ABNORMAL HIGH (ref 11.5–15.5)
WBC: 20.1 10*3/uL — AB (ref 4.0–10.5)

## 2016-02-18 LAB — COMPREHENSIVE METABOLIC PANEL
ALK PHOS: 66 U/L (ref 38–126)
ALT: 19 U/L (ref 17–63)
ANION GAP: 8 (ref 5–15)
AST: 18 U/L (ref 15–41)
Albumin: 3.1 g/dL — ABNORMAL LOW (ref 3.5–5.0)
BILIRUBIN TOTAL: 0.5 mg/dL (ref 0.3–1.2)
BUN: 30 mg/dL — ABNORMAL HIGH (ref 6–20)
CALCIUM: 9 mg/dL (ref 8.9–10.3)
CO2: 24 mmol/L (ref 22–32)
Chloride: 106 mmol/L (ref 101–111)
Creatinine, Ser: 0.9 mg/dL (ref 0.61–1.24)
GFR calc non Af Amer: >60 mL/min (ref >60)
Glucose, Bld: 163 mg/dL — ABNORMAL HIGH (ref 65–99)
POTASSIUM: 4.2 mmol/L (ref 3.5–5.1)
Sodium: 138 mmol/L (ref 135–145)
TOTAL PROTEIN: 6.2 g/dL — AB (ref 6.5–8.1)

## 2016-02-18 MED ORDER — ONDANSETRON HCL 40 MG/20ML IJ SOLN
Freq: Once | INTRAMUSCULAR | Status: AC
  Administered 2016-02-18: 8 mg via INTRAVENOUS
  Filled 2016-02-18: qty 4

## 2016-02-18 MED ORDER — VINCRISTINE SULFATE CHEMO INJECTION 1 MG/ML
Freq: Once | INTRAVENOUS | Status: AC
  Administered 2016-02-18: 15:00:00 via INTRAVENOUS
  Filled 2016-02-18: qty 10

## 2016-02-18 NOTE — Progress Notes (Signed)
[name]   HEMATOLOGY/ONCOLOGY INPATIENT PROGRESS NOTE  Date of Service: 02/18/2016  Inpatient Attending: .Brunetta Genera, MD   SUBJECTIVE  Patient notes no new issues with his current chemotherapy. Today is cycle 3D3 of EPOCH-R. No nausea no vomiting no back pain no headaches no fevers or chills. No overt symptoms from anemia.  OBJECTIVE:  NAD  PHYSICAL EXAMINATION: . Vitals:   02/18/16 0630 02/18/16 1510 02/18/16 2146 02/19/16 0538  BP: (!) 144/69 140/69 138/70 (!) 157/75  Pulse: 80 78 77 74  Resp: 18 20 19 18   Temp: 98 F (36.7 C) 98.1 F (36.7 C) 98.3 F (36.8 C) 97.8 F (36.6 C)  TempSrc: Oral Oral Oral Oral  SpO2: 100% 100% 97% 100%  Weight:      Height:       Filed Weights   02/16/16 1410  Weight: 185 lb 6.4 oz (84.1 kg)   .Body mass index is 25.14 kg/m.  GENERAL:alert, in no acute distress and comfortable SKIN: skin color, texture, turgor are normal, no rashes or significant lesions EYES: normal, conjunctiva are pink and non-injected, sclera clear OROPHARYNX:no exudate, no erythema and lips, buccal mucosa, and tongue normal  NECK: supple, no JVD, thyroid normal size, non-tender, without nodularity LYMPH:  no palpable lymphadenopathy in the cervical, axillary or inguinal LUNGS: clear to auscultation with normal respiratory effort HEART: regular rate & rhythm,  no murmurs and no lower extremity edema ABDOMEN: abdomen soft, non-tender, normoactive bowel sounds  Musculoskeletal: no cyanosis of digits and no clubbing  PSYCH: alert & oriented x 3 with fluent speech NEURO: no focal motor/sensory deficits  MEDICAL HISTORY:  Past Medical History:  Diagnosis Date  . Cancer ([place])    Burkett Lymphoma  . Hypertension   . PAD (peripheral artery disease) (HCC) left leg    SURGICAL HISTORY: Past Surgical History:  Procedure Laterality Date  . IR GENERIC HISTORICAL  12/31/2015   IR FLUORO GUIDE CV LINE RIGHT 12/31/2015 Arne Cleveland, MD [facility]  . IR  GENERIC HISTORICAL  12/31/2015   IR US GUIDE VASC ACCESS RIGHT 12/31/2015 Arne Cleveland, MD [facility]  . left salivary Left    removed    SOCIAL HISTORY: Social History   Social History  . Marital status: Married    Spouse name: N/A  . Number of children: N/A  . Years of education: N/A   Occupational History  . meter specialist    Social History Main Topics  . Smoking status: Former Smoker    Packs/day: 2.00    Years: 16.00    Quit date: 12/22/2005  . Smokeless tobacco: Never Used  . Alcohol use Yes     Comment: occasionally  . Drug use: No  . Sexual activity: No   Other Topics Concern  . Not on file   Social History Narrative  . No narrative on file    FAMILY HISTORY: Family History  Problem Relation Age of Onset  . Alzheimer's disease Father 58    ALLERGIES:  has No Known Allergies.  MEDICATIONS:  Scheduled Meds: . amLODipine  10 mg Oral Daily  . aspirin EC  81 mg Oral Daily  . chlorhexidine  15 mL Mouth/Throat BID  . DOXOrubicin/vinCRIStine/etoposide CHEMO IV infusion for Inpatient CI   Intravenous Once  . enoxaparin (LOVENOX) injection  40 mg Subcutaneous Q24H  . lisinopril  20 mg Oral Daily  . mirtazapine  15 mg Oral QHS  . predniSONE  60 mg/m2/day (Treatment Plan Recorded) Oral QAC breakfast  Continuous Infusions: . sodium chloride 10 mL/hr at 02/16/16 1239   PRN Meds:.acetaminophen, alteplase, alum & mag hydroxide-simeth, Cold Pack, heparin lock flush, heparin lock flush, Hot Pack, LORazepam, ondansetron **OR** ondansetron **OR** ondansetron (ZOFRAN) IV **OR** ondansetron (ZOFRAN) IV, oxyCODONE, polyethylene glycol, senna-docusate, sodium chloride flush, sodium chloride flush, sodium chloride flush  REVIEW OF SYSTEMS:    10 Point review of Systems was done is negative except as noted above.   LABORATORY DATA:  I have reviewed the data as listed  . CBC Latest Ref Rng & Units 02/18/2016 02/17/2016 02/16/2016  WBC 4.0 - 10.5 K/uL 20.1(H) 15.6(H)  10.7(H)  Hemoglobin 13.0 - 17.0 g/dL 8.7(L) 9.1(L) 8.8(L)  Hematocrit 39.0 - 52.0 % 26.9(L) 28.0(L) 27.2(L)  Platelets 150 - 400 K/uL 529(H) 603(H) 546(H)    . CMP Latest Ref Rng & Units 02/18/2016 02/17/2016 02/16/2016  Glucose 65 - 99 mg/dL 163(H) 161(H) 231(H)  BUN 6 - 20 mg/dL 30(H) 27(H) 24(H)  Creatinine 0.61 - 1.24 mg/dL 0.90 1.01 1.10  Sodium 135 - 145 mmol/L 138 138 137  Potassium 3.5 - 5.1 mmol/L 4.2 4.4 3.8  Chloride 101 - 111 mmol/L 106 105 103  CO2 22 - 32 mmol/L 24 24 26   Calcium 8.9 - 10.3 mg/dL 9.0 9.3 9.0  Total Protein 6.5 - 8.1 g/dL 6.2(L) 6.5 6.3(L)  Total Bilirubin 0.3 - 1.2 mg/dL 0.5 0.4 0.5  Alkaline Phos 38 - 126 U/L 66 70 74  AST 15 - 41 U/L 18 18 20   ALT 17 - 63 U/L 19 20 18      RADIOGRAPHIC STUDIES: I have personally reviewed the radiological images as listed and agreed with the findings in the report. Dg Chest 2 View  Result Date: 02/06/2016 CLINICAL DATA:  Fevers. EXAM: CHEST  2 VIEW COMPARISON:  7/20/ 17 FINDINGS: There is a right chest wall port a catheter with tip in the cavoatrial junction. Normal heart size. Lung volumes are low. No pleural effusion or edema. No airspace consolidation. Asymmetric elevation of the right hemidiaphragm is again noted. IMPRESSION: No active cardiopulmonary disease. Electronically Signed   By: Kerby Moors M.D.   On: 02/06/2016 20:39   Dg Fluoro Guided Loc Of Needle/cath Tip For Spinal Inject Rt  Result Date: 02/16/2016 CLINICAL DATA:  Lumbar puncture with chemotherapy injection, diagnostic and therapeutic. Burkitt's lymphoma. EXAM: FLUOROSCOPICALLY GUIDED LUMBAR PUNCTURE FOR INTRATHECAL CHEMOTHERAPY TECHNIQUE: Informed consent was obtained from the patient prior to the procedure, including potential complications of headache, allergy, and pain. A 'time out' was performed. With the patient prone, the lower back was prepped with Betadine. 1% Lidocaine was used for local anesthesia. Lumbar puncture was performed at the L2-3  using a 20 gauge needle with return of clear CSF. Opening pressure 14 cm of water. 10 cc of clear CSF was collected. 12 mg methotrexate with 50 mg hydrocortisone in a 10 mL solutionwas injected into the subarachnoid space. The patient tolerated the procedure well without apparent complication. FLUOROSCOPY TIME:  20 seconds IMPRESSION: Intrathecal injection of chemotherapy without complication Electronically Signed   By: Rolm Baptise M.D.   On: 02/16/2016 12:54   Dg Fluoro Guide Lumbar Puncture  Result Date: 02/16/2016 CLINICAL DATA:  Lumbar puncture with chemotherapy injection, diagnostic and therapeutic. Burkitt's lymphoma. EXAM: FLUOROSCOPICALLY GUIDED LUMBAR PUNCTURE FOR INTRATHECAL CHEMOTHERAPY TECHNIQUE: Informed consent was obtained from the patient prior to the procedure, including potential complications of headache, allergy, and pain. A 'time out' was performed. With the patient prone, the lower back was prepped  with Betadine. 1% Lidocaine was used for local anesthesia. Lumbar puncture was performed at the L2-3 using a 20 gauge needle with return of clear CSF. Opening pressure 14 cm of water. 10 cc of clear CSF was collected. 12 mg methotrexate with 50 mg hydrocortisone in a 10 mL solutionwas injected into the subarachnoid space. The patient tolerated the procedure well without apparent complication. FLUOROSCOPY TIME:  20 seconds IMPRESSION: Intrathecal injection of chemotherapy without complication Electronically Signed   By: Rolm Baptise M.D.   On: 02/16/2016 12:54    ASSESSMENT & PLAN:   65 year old Caucasian male with  #1 Stage IVB Burkitt's lymphoma with t(8;14) translocation This appears to be involving the sacrum causing sacral and root involvement with possible neurogenic bladder. PET CT scan as noted above shows extensive involvement with lymphoma. CSF negative for involvement. Bone marrow biopsy negative for involvement by lymphoma. Patient has completed cycle 2of EPOCH-R and  tolerated it well with no prohibitive toxicities. Grade 1 fatigue.  #2 normocytic anemia likely related to chemotherapy. No evidence of bleeding. No overt symptoms from his anemia at this time. #3 Leucocytosis - likely from high dose steroids. Plan - patient receiving c3D3 EPOCH-R. Tolerated IT methotrexate D1 -labs are stable. -continue daily cbc, bmp -started on Aspirin today -will restart plavixin 1-2 days -Peridex mouth washes 2 times a day for a week and then twice daily. -continue current treatment -transfuse irradiated PRBC as needed for Hgb<8 or if symptomatic. -We'll need to follow up for Neulasta shot on Saturday [facility]23/2017at [facility] -he will get rpt PET/CT as outpatient prior to next cycle of chemotherapy.  #4left sacral ala and S1 fracture. L5-S1 and S1-S2 left-sided nerve root compression. #5neoplasm related pain - well-controlled. Minimal use of oxycodone. Plan -Pain well controlled. -when necessary oxycodone for intermittent neoplasm related pain.  -Senna S and MiraLAX as needed. -SI joint brace and help with pain control.  #4 Hypertension, dyslipidemia, peripheral arterial disease -Continue current outpatient medications    Sullivan Lone MD [place] AAHIVMS [facility] Hematology/Oncology Physician [facility]  (Office):       [phone] (Work cell):  [phone] (Fax):           [phone]

## 2016-02-18 NOTE — Progress Notes (Signed)
Chemo dosages and calculations checked with Aldean Baker RN.

## 2016-02-19 LAB — COMPREHENSIVE METABOLIC PANEL
ALBUMIN: 3.4 g/dL — AB (ref 3.5–5.0)
ALT: 19 U/L (ref 17–63)
ANION GAP: 8 (ref 5–15)
AST: 16 U/L (ref 15–41)
Alkaline Phosphatase: 65 U/L (ref 38–126)
BUN: 27 mg/dL — AB (ref 6–20)
CHLORIDE: 106 mmol/L (ref 101–111)
CO2: 27 mmol/L (ref 22–32)
Calcium: 9.3 mg/dL (ref 8.9–10.3)
Creatinine, Ser: 0.86 mg/dL (ref 0.61–1.24)
GFR calc Af Amer: >60 mL/min (ref >60)
GFR calc non Af Amer: >60 mL/min (ref >60)
GLUCOSE: 136 mg/dL — AB (ref 65–99)
POTASSIUM: 4 mmol/L (ref 3.5–5.1)
SODIUM: 141 mmol/L (ref 135–145)
Total Bilirubin: 0.4 mg/dL (ref 0.3–1.2)
Total Protein: 6.5 g/dL (ref 6.5–8.1)

## 2016-02-19 LAB — CBC
HEMATOCRIT: 31.6 % — AB (ref 39.0–52.0)
HEMOGLOBIN: 10.1 g/dL — AB (ref 13.0–17.0)
MCH: 26.5 pg (ref 26.0–34.0)
MCHC: 32 g/dL (ref 30.0–36.0)
MCV: 82.9 fL (ref 78.0–100.0)
Platelets: 577 10*3/uL — ABNORMAL HIGH (ref 150–400)
RBC: 3.81 MIL/uL — ABNORMAL LOW (ref 4.22–5.81)
RDW: 17.2 % — AB (ref 11.5–15.5)
WBC: 14.8 10*3/uL — ABNORMAL HIGH (ref 4.0–10.5)

## 2016-02-19 MED ORDER — SODIUM CHLORIDE 0.9 % IV SOLN
Freq: Once | INTRAVENOUS | Status: AC
  Administered 2016-02-19: 8 mg via INTRAVENOUS
  Filled 2016-02-19: qty 4

## 2016-02-19 MED ORDER — VINCRISTINE SULFATE CHEMO INJECTION 1 MG/ML
Freq: Once | INTRAVENOUS | Status: AC
  Administered 2016-02-19: 15:00:00 via INTRAVENOUS
  Filled 2016-02-19: qty 10

## 2016-02-19 NOTE — Progress Notes (Signed)
Adriamycin, Etoposide, Vincristine bag #4 assessed with 2nd chemo competent RN Reyne Dumas.

## 2016-02-20 ENCOUNTER — Other Ambulatory Visit: Payer: Self-pay | Admitting: *Deleted

## 2016-02-20 DIAGNOSIS — D72829 Elevated white blood cell count, unspecified: Secondary | ICD-10-CM

## 2016-02-20 LAB — COMPREHENSIVE METABOLIC PANEL
ALK PHOS: 60 U/L (ref 38–126)
ALT: 18 U/L (ref 17–63)
AST: 15 U/L (ref 15–41)
Albumin: 3.1 g/dL — ABNORMAL LOW (ref 3.5–5.0)
Anion gap: 7 (ref 5–15)
BUN: 25 mg/dL — ABNORMAL HIGH (ref 6–20)
CALCIUM: 9.1 mg/dL (ref 8.9–10.3)
CO2: 26 mmol/L (ref 22–32)
CREATININE: 0.85 mg/dL (ref 0.61–1.24)
Chloride: 108 mmol/L (ref 101–111)
Glucose, Bld: 156 mg/dL — ABNORMAL HIGH (ref 65–99)
Potassium: 3.7 mmol/L (ref 3.5–5.1)
SODIUM: 141 mmol/L (ref 135–145)
Total Bilirubin: 0.5 mg/dL (ref 0.3–1.2)
Total Protein: 5.8 g/dL — ABNORMAL LOW (ref 6.5–8.1)

## 2016-02-20 LAB — CBC
HCT: 28.6 % — ABNORMAL LOW (ref 39.0–52.0)
Hemoglobin: 9.3 g/dL — ABNORMAL LOW (ref 13.0–17.0)
MCH: 26.4 pg (ref 26.0–34.0)
MCHC: 32.5 g/dL (ref 30.0–36.0)
MCV: 81.3 fL (ref 78.0–100.0)
PLATELETS: 510 10*3/uL — AB (ref 150–400)
RBC: 3.52 MIL/uL — AB (ref 4.22–5.81)
RDW: 16.7 % — ABNORMAL HIGH (ref 11.5–15.5)
WBC: 8.4 10*3/uL (ref 4.0–10.5)

## 2016-02-20 MED ORDER — ALTEPLASE 2 MG IJ SOLR: 2.0000 mg | Freq: Once | INTRAMUSCULAR | Status: DC | PRN

## 2016-02-20 MED ORDER — SODIUM CHLORIDE 0.9 % IV SOLN
Freq: Once | INTRAVENOUS | Status: AC
  Administered 2016-02-20: 16 mg via INTRAVENOUS
  Filled 2016-02-20: qty 8

## 2016-02-20 MED ORDER — SODIUM CHLORIDE 0.9 % IV SOLN: Freq: Once | INTRAVENOUS | Status: DC

## 2016-02-20 MED ORDER — DIPHENHYDRAMINE HCL 50 MG PO CAPS
50.0000 mg | ORAL_CAPSULE | Freq: Once | ORAL | Status: AC
  Administered 2016-02-20: 50 mg via ORAL
  Filled 2016-02-20: qty 1

## 2016-02-20 MED ORDER — SODIUM CHLORIDE 0.9 % IV SOLN: Freq: Once | INTRAVENOUS | Status: DC | PRN

## 2016-02-20 MED ORDER — EPINEPHRINE HCL 1 MG/ML IJ SOLN
0.5000 mg | Freq: Once | INTRAMUSCULAR | Status: DC | PRN
  Filled 2016-02-20: qty 1

## 2016-02-20 MED ORDER — ACETAMINOPHEN 325 MG PO TABS
650.0000 mg | ORAL_TABLET | Freq: Once | ORAL | Status: AC
  Administered 2016-02-20: 650 mg via ORAL
  Filled 2016-02-20: qty 2

## 2016-02-20 MED ORDER — METHYLPREDNISOLONE SODIUM SUCC 125 MG IJ SOLR: 125.0000 mg | Freq: Once | INTRAMUSCULAR | Status: DC | PRN

## 2016-02-20 MED ORDER — EPINEPHRINE HCL 0.1 MG/ML IJ SOSY
0.2500 mg | PREFILLED_SYRINGE | Freq: Once | INTRAMUSCULAR | Status: DC | PRN
  Filled 2016-02-20: qty 10

## 2016-02-20 MED ORDER — FAMOTIDINE IN NACL 20-0.9 MG/50ML-% IV SOLN: 20.0000 mg | Freq: Once | INTRAVENOUS | Status: DC | PRN

## 2016-02-20 MED ORDER — DIPHENHYDRAMINE HCL 50 MG/ML IJ SOLN: 50.0000 mg | Freq: Once | INTRAMUSCULAR | Status: DC | PRN

## 2016-02-20 MED ORDER — SODIUM CHLORIDE 0.9 % IV SOLN
375.0000 mg/m2 | Freq: Once | INTRAVENOUS | Status: AC
  Administered 2016-02-20: 800 mg via INTRAVENOUS
  Filled 2016-02-20: qty 50

## 2016-02-20 MED ORDER — HEPARIN SOD (PORK) LOCK FLUSH 100 UNIT/ML IV SOLN: 500.0000 [IU] | Freq: Once | INTRAVENOUS | Status: DC | PRN

## 2016-02-20 MED ORDER — SODIUM CHLORIDE 0.9 % IV SOLN
750.0000 mg/m2 | Freq: Once | INTRAVENOUS | Status: AC
  Administered 2016-02-20: 1560 mg via INTRAVENOUS
  Filled 2016-02-20: qty 78

## 2016-02-20 MED ORDER — DIPHENHYDRAMINE HCL 50 MG/ML IJ SOLN: 25.0000 mg | Freq: Once | INTRAMUSCULAR | Status: DC | PRN

## 2016-02-20 MED ORDER — ALBUTEROL SULFATE (2.5 MG/3ML) 0.083% IN NEBU: 2.5000 mg | INHALATION_SOLUTION | Freq: Once | RESPIRATORY_TRACT | Status: DC | PRN

## 2016-02-20 MED ORDER — SODIUM CHLORIDE 0.9% FLUSH: 3.0000 mL | INTRAVENOUS | Status: DC | PRN

## 2016-02-20 MED ORDER — SODIUM CHLORIDE 0.9% FLUSH: 10.0000 mL | INTRAVENOUS | Status: DC | PRN

## 2016-02-20 MED ORDER — HEPARIN SOD (PORK) LOCK FLUSH 100 UNIT/ML IV SOLN: 250.0000 [IU] | Freq: Once | INTRAVENOUS | Status: DC | PRN

## 2016-02-20 NOTE — Progress Notes (Deleted)
[name]   HEMATOLOGY/ONCOLOGY INPATIENT PROGRESS NOTE  Date of Service: 02/19/2016  Inpatient Attending: .Brunetta Genera, MD   SUBJECTIVE  Patient notes no new issues with his current chemotherapy. Today is cycle 3D4 of EPOCH-R. No nausea no vomiting no back pain no headaches no fevers or chills. No overt symptoms from anemia. Patient is eager to be discharge home tomorrow.  OBJECTIVE:  NAD  PHYSICAL EXAMINATION: . Vitals:   02/19/16 2248 02/20/16 0657 02/20/16 1237 02/20/16 1313  BP: (!) 162/77 (!) 165/86 (!) 157/79 137/66  Pulse: 81 82 74 85  Resp: 20 20 20    Temp: 97.8 F (36.6 C) 98.2 F (36.8 C) 97.7 F (36.5 C) 97.6 F (36.4 C)  TempSrc: Oral Oral Oral Oral  SpO2: 97% 98% 100%   Weight:      Height:       Filed Weights   02/16/16 1410  Weight: 185 lb 6.4 oz (84.1 kg)   .Body mass index is 25.14 kg/m.  GENERAL:alert, in no acute distress and comfortable SKIN: skin color, texture, turgor are normal, no rashes or significant lesions EYES: normal, conjunctiva are pink and non-injected, sclera clear OROPHARYNX:no exudate, no erythema and lips, buccal mucosa, and tongue normal  NECK: supple, no JVD, thyroid normal size, non-tender, without nodularity LYMPH:  no palpable lymphadenopathy in the cervical, axillary or inguinal LUNGS: clear to auscultation with normal respiratory effort HEART: regular rate & rhythm,  no murmurs and no lower extremity edema ABDOMEN: abdomen soft, non-tender, normoactive bowel sounds  Musculoskeletal: no cyanosis of digits and no clubbing  PSYCH: alert & oriented x 3 with fluent speech NEURO: no focal motor/sensory deficits  MEDICAL HISTORY:  Past Medical History:  Diagnosis Date  . Cancer ([place])    [place]ett Lymphoma  . Hypertension   . PAD (peripheral artery disease) ([facility]) left leg    SURGICAL HISTORY: Past Surgical History:  Procedure Laterality Date  . IR GENERIC HISTORICAL  12/31/2015   IR FLUORO GUIDE CV LINE RIGHT  12/31/2015 Arne Cleveland, MD [facility]  . IR GENERIC HISTORICAL  12/31/2015   IR US GUIDE VASC ACCESS RIGHT 12/31/2015 Arne Cleveland, MD [facility]  . left salivary Left    removed    SOCIAL HISTORY: Social History   Social History  . Marital status: Married    Spouse name: N/A  . Number of children: N/A  . Years of education: N/A   Occupational History  . meter specialist    Social History Main Topics  . Smoking status: Former Smoker    Packs/day: 2.00    Years: 16.00    Quit date: 12/22/2005  . Smokeless tobacco: Never Used  . Alcohol use Yes     Comment: occasionally  . Drug use: No  . Sexual activity: No   Other Topics Concern  . Not on file   Social History Narrative  . No narrative on file    FAMILY HISTORY: Family History  Problem Relation Age of Onset  . Alzheimer's disease Father 77    ALLERGIES:  has No Known Allergies.  MEDICATIONS:  Scheduled Meds: . sodium chloride   Intravenous Once  . amLODipine  10 mg Oral Daily  . aspirin EC  81 mg Oral Daily  . chlorhexidine  15 mL Mouth/Throat BID  . enoxaparin (LOVENOX) injection  40 mg Subcutaneous Q24H  . lisinopril  20 mg Oral Daily  . mirtazapine  15 mg Oral QHS   Continuous Infusions: . sodium chloride 10 mL/hr at  02/16/16 1239   PRN Meds:.sodium chloride, acetaminophen, albuterol, alteplase, alteplase, alum & mag hydroxide-simeth, Cold Pack, diphenhydrAMINE, diphenhydrAMINE, EPINEPHrine, EPINEPHrine, EPINEPHrine, EPINEPHrine, famotidine, heparin lock flush, heparin lock flush, heparin lock flush, heparin lock flush, Hot Pack, LORazepam, methylPREDNISolone sodium succinate, ondansetron **OR** ondansetron **OR** ondansetron (ZOFRAN) IV **OR** ondansetron (ZOFRAN) IV, oxyCODONE, polyethylene glycol, senna-docusate, sodium chloride flush, sodium chloride flush, sodium chloride flush, sodium chloride flush, sodium chloride flush  REVIEW OF SYSTEMS:    10 Point review of Systems was done is negative  except as noted above.   LABORATORY DATA:  I have reviewed the data as listed  . CBC Latest Ref Rng & Units 02/20/2016 02/19/2016 02/18/2016  WBC 4.0 - 10.5 K/uL 8.4 14.8(H) 20.1(H)  Hemoglobin 13.0 - 17.0 g/dL 9.3(L) 10.1(L) 8.7(L)  Hematocrit 39.0 - 52.0 % 28.6(L) 31.6(L) 26.9(L)  Platelets 150 - 400 K/uL 510(H) 577(H) 529(H)    . CMP Latest Ref Rng & Units 02/20/2016 02/19/2016 02/18/2016  Glucose 65 - 99 mg/dL 156(H) 136(H) 163(H)  BUN 6 - 20 mg/dL 25(H) 27(H) 30(H)  Creatinine 0.61 - 1.24 mg/dL 0.85 0.86 0.90  Sodium 135 - 145 mmol/L 141 141 138  Potassium 3.5 - 5.1 mmol/L 3.7 4.0 4.2  Chloride 101 - 111 mmol/L 108 106 106  CO2 22 - 32 mmol/L 26 27 24   Calcium 8.9 - 10.3 mg/dL 9.1 9.3 9.0  Total Protein 6.5 - 8.1 g/dL 5.8(L) 6.5 6.2(L)  Total Bilirubin 0.3 - 1.2 mg/dL 0.5 0.4 0.5  Alkaline Phos 38 - 126 U/L 60 65 66  AST 15 - 41 U/L 15 16 18   ALT 17 - 63 U/L 18 19 19      RADIOGRAPHIC STUDIES: I have personally reviewed the radiological images as listed and agreed with the findings in the report. Dg Chest 2 View  Result Date: 02/06/2016 CLINICAL DATA:  Fevers. EXAM: CHEST  2 VIEW COMPARISON:  7/20/ 17 FINDINGS: There is a right chest wall port a catheter with tip in the cavoatrial junction. Normal heart size. Lung volumes are low. No pleural effusion or edema. No airspace consolidation. Asymmetric elevation of the right hemidiaphragm is again noted. IMPRESSION: No active cardiopulmonary disease. Electronically Signed   By: Kerby Moors M.D.   On: 02/06/2016 20:39   Dg Fluoro Guided Loc Of Needle/cath Tip For Spinal Inject Rt  Result Date: 02/16/2016 CLINICAL DATA:  Lumbar puncture with chemotherapy injection, diagnostic and therapeutic. Burkitt's lymphoma. EXAM: FLUOROSCOPICALLY GUIDED LUMBAR PUNCTURE FOR INTRATHECAL CHEMOTHERAPY TECHNIQUE: Informed consent was obtained from the patient prior to the procedure, including potential complications of headache, allergy, and pain. A  'time out' was performed. With the patient prone, the lower back was prepped with Betadine. 1% Lidocaine was used for local anesthesia. Lumbar puncture was performed at the L2-3 using a 20 gauge needle with return of clear CSF. Opening pressure 14 cm of water. 10 cc of clear CSF was collected. 12 mg methotrexate with 50 mg hydrocortisone in a 10 mL solutionwas injected into the subarachnoid space. The patient tolerated the procedure well without apparent complication. FLUOROSCOPY TIME:  20 seconds IMPRESSION: Intrathecal injection of chemotherapy without complication Electronically Signed   By: Rolm Baptise M.D.   On: 02/16/2016 12:54   Dg Fluoro Guide Lumbar Puncture  Result Date: 02/16/2016 CLINICAL DATA:  Lumbar puncture with chemotherapy injection, diagnostic and therapeutic. Burkitt's lymphoma. EXAM: FLUOROSCOPICALLY GUIDED LUMBAR PUNCTURE FOR INTRATHECAL CHEMOTHERAPY TECHNIQUE: Informed consent was obtained from the patient prior to the procedure, including potential complications of  headache, allergy, and pain. A 'time out' was performed. With the patient prone, the lower back was prepped with Betadine. 1% Lidocaine was used for local anesthesia. Lumbar puncture was performed at the L2-3 using a 20 gauge needle with return of clear CSF. Opening pressure 14 cm of water. 10 cc of clear CSF was collected. 12 mg methotrexate with 50 mg hydrocortisone in a 10 mL solutionwas injected into the subarachnoid space. The patient tolerated the procedure well without apparent complication. FLUOROSCOPY TIME:  20 seconds IMPRESSION: Intrathecal injection of chemotherapy without complication Electronically Signed   By: Rolm Baptise M.D.   On: 02/16/2016 12:54    ASSESSMENT & PLAN:   65 year old Caucasian male with  #1 Stage IVB Burkitt's lymphoma with t(8;14) translocation This appears to be involving the sacrum causing sacral and root involvement with possible neurogenic bladder. PET CT scan as noted above  shows extensive involvement with lymphoma. CSF negative for involvement. Bone marrow biopsy negative for involvement by lymphoma. Patient has completed cycle 2of EPOCH-R and tolerated it well with no prohibitive toxicities. Grade 1 fatigue.  #2 normocytic anemia likely related to chemotherapy. No evidence of bleeding. No overt symptoms from his anemia at this time. #3 Leucocytosis - likely from high dose steroids. Now resolved. Plan - patient receiving c3D4 EPOCH-R today. Tolerated IT methotrexate D1 -labs are stable. -continue daily cbc, bmp -started on Aspirin today - restart plavix on discharge tomorrow. -Peridex mouth washes 2 times a day for a week and then twice daily. -continue current treatment -transfuse irradiated PRBC as needed for Hgb<8 or if symptomatic. -We'll need to follow up for Neulasta shot on Saturday 9/23/2017at [place] -he will get rpt PET/CT as outpatient prior to next cycle of chemotherapy.  #4left sacral ala and S1 fracture. L5-S1 and S1-S2 left-sided nerve root compression. #5neoplasm related pain - well-controlled. Minimal use of oxycodone. Plan -Pain well controlled. -when necessary oxycodone for intermittent neoplasm related pain.  -Senna S and MiraLAX as needed. -SI joint brace and help with pain control.  #4 Hypertension, dyslipidemia, peripheral arterial disease -Continue current outpatient medications    Sullivan Lone MD MS AAHIVMS [facility] Hematology/Oncology Physician [facility]  (Office):       [phone] (Work cell):  [phone] (Fax):           [phone]

## 2016-02-20 NOTE — Discharge Summary (Signed)
[place]  Telephone:([phone]) [phone] Fax:([phone]    Physician Discharge Summary     Patient ID: Erik Vaughn MRN: [id number] DOB/AGE: 1951-04-05 65 y.o.  Admit date: 02/16/2016 Discharge date: 02/20/2016  Primary Care Physician:  Ernestene Kiel, MD   Discharge Diagnoses:    Present on Admission: . Burkitt's lymphoma of lymph nodes of multiple sites ([place]) . Burkitt lymphoma of lymph nodes of multiple regions [facility])   Discharge Medications:    Medication List    STOP taking these medications   amoxicillin-clavulanate 875-125 MG tablet Commonly known as:  AUGMENTIN     TAKE these medications   amLODipine 10 MG tablet Commonly known as:  NORVASC Take 10 mg by mouth daily.   aspirin EC 81 MG tablet Take 81 mg by mouth daily.   CENTRUM SILVER PO Take 1 tablet by mouth daily with breakfast.   clopidogrel 75 MG tablet Commonly known as:  PLAVIX Take 75 mg by mouth daily.   dexamethasone 4 MG tablet Commonly known as:  DECADRON Take 2 tablets (8 mg total) by mouth 2 (two) times daily with a meal. Take two times a day starting the day after chemotherapy for 3 days.   lisinopril 20 MG tablet Commonly known as:  PRINIVIL,ZESTRIL Take 20 mg by mouth daily.   LORazepam 0.5 MG tablet Commonly known as:  ATIVAN Take 1 tablet (0.5 mg total) by mouth every 6 (six) hours as needed (Nausea or vomiting). What changed:  when to take this  reasons to take this   magic mouthwash w/lidocaine Soln Take 5 mLs by mouth 4 (four) times daily.   mirtazapine 15 MG tablet Commonly known as:  REMERON Take 1 tablet (15 mg total) by mouth at bedtime.   ondansetron 8 MG tablet Commonly known as:  ZOFRAN Take 1 tablet (8 mg total) by mouth 2 (two) times daily. Take two times a day starting the day after chemo for 3 days. Then take two times a day as needed for nausea or vomiting.   oxyCODONE 5 MG immediate release tablet Commonly known as:   Oxy IR/ROXICODONE Take 1-2 tablets (5-10 mg total) by mouth every 6 (six) hours as needed for moderate pain or severe pain.   polyethylene glycol packet Commonly known as:  MIRALAX Take 17 g by mouth daily as needed for moderate constipation.   prochlorperazine 10 MG tablet Commonly known as:  COMPAZINE Take 1 tablet (10 mg total) by mouth every 6 (six) hours as needed (Nausea or vomiting).   senna-docusate 8.6-50 MG tablet Commonly known as:  SENNA S Take 2 tablets by mouth at bedtime as needed for moderate constipation.   vitamin C 500 MG tablet Commonly known as:  ASCORBIC ACID Take 500 mg by mouth 2 (two) times daily.   Vitamin D3 2000 units Tabs Take 2,000 Units by mouth daily with lunch.        Disposition and Follow-up:   Significant Diagnostic Studies:  Dg Chest 2 View  Result Date: 02/06/2016 CLINICAL DATA:  Fevers. EXAM: CHEST  2 VIEW COMPARISON:  7/20/ 17 FINDINGS: There is a right chest wall port a catheter with tip in the cavoatrial junction. Normal heart size. Lung volumes are low. No pleural effusion or edema. No airspace consolidation. Asymmetric elevation of the right hemidiaphragm is again noted. IMPRESSION: No active cardiopulmonary disease. Electronically Signed   By: Kerby Moors M.D.   On: 02/06/2016 20:39   Dg Fluoro Guided Loc Of Needle/cath Tip  For Spinal Inject Rt  Result Date: 02/16/2016 CLINICAL DATA:  Lumbar puncture with chemotherapy injection, diagnostic and therapeutic. Burkitt's lymphoma. EXAM: FLUOROSCOPICALLY GUIDED LUMBAR PUNCTURE FOR INTRATHECAL CHEMOTHERAPY TECHNIQUE: Informed consent was obtained from the patient prior to the procedure, including potential complications of headache, allergy, and pain. A 'time out' was performed. With the patient prone, the lower back was prepped with Betadine. 1% Lidocaine was used for local anesthesia. Lumbar puncture was performed at the L2-3 using a 20 gauge needle with return of clear CSF. Opening  pressure 14 cm of water. 10 cc of clear CSF was collected. 12 mg methotrexate with 50 mg hydrocortisone in a 10 mL solutionwas injected into the subarachnoid space. The patient tolerated the procedure well without apparent complication. FLUOROSCOPY TIME:  20 seconds IMPRESSION: Intrathecal injection of chemotherapy without complication Electronically Signed   By: Rolm Baptise M.D.   On: 02/16/2016 12:54   Dg Fluoro Guide Lumbar Puncture  Result Date: 02/16/2016 CLINICAL DATA:  Lumbar puncture with chemotherapy injection, diagnostic and therapeutic. Burkitt's lymphoma. EXAM: FLUOROSCOPICALLY GUIDED LUMBAR PUNCTURE FOR INTRATHECAL CHEMOTHERAPY TECHNIQUE: Informed consent was obtained from the patient prior to the procedure, including potential complications of headache, allergy, and pain. A 'time out' was performed. With the patient prone, the lower back was prepped with Betadine. 1% Lidocaine was used for local anesthesia. Lumbar puncture was performed at the L2-3 using a 20 gauge needle with return of clear CSF. Opening pressure 14 cm of water. 10 cc of clear CSF was collected. 12 mg methotrexate with 50 mg hydrocortisone in a 10 mL solutionwas injected into the subarachnoid space. The patient tolerated the procedure well without apparent complication. FLUOROSCOPY TIME:  20 seconds IMPRESSION: Intrathecal injection of chemotherapy without complication Electronically Signed   By: Rolm Baptise M.D.   On: 02/16/2016 12:54    Discharge Laboratory Values: . CBC Latest Ref Rng & Units 02/20/2016 02/19/2016 02/18/2016  WBC 4.0 - 10.5 K/uL 8.4 14.8(H) 20.1(H)  Hemoglobin 13.0 - 17.0 g/dL 9.3(L) 10.1(L) 8.7(L)  Hematocrit 39.0 - 52.0 % 28.6(L) 31.6(L) 26.9(L)  Platelets 150 - 400 K/uL 510(H) 577(H) 529(H)  . CMP Latest Ref Rng & Units 02/20/2016 02/19/2016 02/18/2016  Glucose 65 - 99 mg/dL 156(H) 136(H) 163(H)  BUN 6 - 20 mg/dL 25(H) 27(H) 30(H)  Creatinine 0.61 - 1.24 mg/dL 0.85 0.86 0.90  Sodium 135 - 145  mmol/L 141 141 138  Potassium 3.5 - 5.1 mmol/L 3.7 4.0 4.2  Chloride 101 - 111 mmol/L 108 106 106  CO2 22 - 32 mmol/L _0 Calcium 8.9 - 10.3 mg/dL 9.1 9.3 9.0  Total Protein 6.5 - 8.1 g/dL 5.8(L) 6.5 6.2(L)  Total Bilirubin 0.3 - 1.2 mg/dL 0.5 0.4 0.5  Alkaline Phos 38 - 126 U/L 60 65 66  AST 15 - 41 U/L _1 ALT 17 - 63 U/L _2 . Lab Results  Component Value Date   LDH 146 02/16/2016     Brief H and P: For complete details please refer to admission H and P, but in brief, Patient is a 65 year old Caucasian male with Burkitt's lymphoma admitted for cycle 3 of EPOCH-R chemotherapy along with CNS prophylaxis with intrathecal day 1 methotrexate. Patient tolerated the treatment well and had acute side effects or concerns during hospitalization. He tolerated the lumbar puncture and intrathecal methotrexate well without headaches or back pains. 65 year old Caucasian male with  #1 Stage IVB Burkitt's lymphoma with t(8;14) translocation This appears  to be involving the sacrum causing sacral and root involvement with possible neurogenic bladder. PET CT scan as noted above shows extensive involvement with lymphoma. CSF negative for involvement. Bone marrow biopsy negative for involvement by lymphoma. Patient has completed cycle 3of EPOCH-R and 1st dose of intrathecal methotrexate and tolerated it well with no prohibitive toxicities. Grade 1 fatigue.  #2normocytic anemia likely related to chemotherapy. No evidence of bleeding. No overt symptoms from his anemia at this time. Has not required blood transfusion at this time #3 Leucocytosis - likely from high dose steroids. Now resolved. Plan -Patient completed his planned chemotherapy without any prohibitive toxicities and was discharged in stable condition. -He will follow-up 24 later in our infusion clinic tomorrow for a Neulasta shot. - restart plavix on discharge  -Peridex mouth washes 2times a day for a week and  then twice daily - given his gingivitis and previous periodontal abscess  -continue current treatment -transfuse irradiated PRBC as needed for Hgb<8 or if symptomatic. -he will get rpt PET/CT as outpatient prior to next cycle of chemotherapy.    #4left sacral ala and S1 fracture. L5-S1 and S1-S2 left-sided nerve root compression. #5neoplasm related pain - well-controlled. Minimal use of oxycodone. Plan -Pain well controlled. -when necessary oxycodone for intermittent neoplasm related pain.  -Senna S and MiraLAX as needed. -SI joint brace and help with pain control.  #4 Hypertension, dyslipidemia, peripheral arterial disease -Continue current outpatient medications  Return to care with Dr. Irene Limbo on 03/09/2016 with labs and a PET/CT scan. Patient has been advised to hold his Plavix one week prior to his next cycle of chemotherapy  Physical Exam at Discharge: BP 137/66 (BP Location: Left Arm)   Pulse 85   Temp 97.6 F (36.4 C) (Oral)   Resp 20   Ht 6' (1.829 m)   Wt 185 lb 6.4 oz (84.1 kg)   SpO2 100%   BMI 25.14 kg/m  GENERAL:alert, in no acute distress and comfortable SKIN: skin color, texture, turgor are normal, no rashes or significant lesions EYES: normal, conjunctiva are pink and non-injected, sclera clear OROPHARYNX:no exudate, no erythema and lips, buccal mucosa, and tongue normal  NECK: supple, no JVD, thyroid normal size, non-tender, without nodularity LYMPH: no palpable lymphadenopathy in the cervical, axillary or inguinal LUNGS: clear to auscultation with normal respiratory effort HEART: regular rate &rhythm, no murmurs and no lower extremity edema ABDOMEN: abdomen soft, non-tender, normoactive bowel sounds  Musculoskeletal: no cyanosis of digits and no clubbing  PSYCH: alert & oriented x 3 with fluent speech NEURO: no focal motor/sensory deficits   Hospital Course:  Active Problems:   Burkitt's lymphoma of lymph nodes of multiple sites [facility])    Encounter for antineoplastic chemotherapy   Burkitt lymphoma of lymph nodes of multiple regions [facility])   Diet:  Regular  Activity: neutropenic precautions. Minimize contact with crowds and people with infections  Condition at Discharge:   Stable  Signed: Dr. Sullivan Lone MD MS [phone]  TT>66mns  02/20/2016, 1:56 PM

## 2016-02-20 NOTE — Discharge Instructions (Signed)
-   followup at the cancer center tomorrow at 1pm for your neulasta shot -infection prevention precautions as we have discussed. -f/u with PET/CT and outpatient clinic appointment as per schedule -hold plavix 1 week prior to next cycle of chemotherapy for lumbar puncture

## 2016-02-20 NOTE — Progress Notes (Signed)
Cytoxan and Rituximab verified and calculated with 2nd chemo competent RN Wilkie Aye.

## 2016-02-20 NOTE — Progress Notes (Signed)
Nursing Discharge Summary  Patient ID: Zaden Sako MRN: [id number] DOB/AGE: February 17, 1951 65 y.o.  Admit date: 02/16/2016 Discharge date: 02/20/2016  Discharged Condition: good  Disposition: 01-Home or Self Care    Prescriptions Given: No new prescriptions.  Patient follow up appointments and medications discussed.  Patient and wife both verbalized understanding without further questions.   Means of Discharge: Patient wishes to ambulate downstairs to be discharged home.    Signed: Buel Ream 02/20/2016, 3:37 PM

## 2016-02-20 NOTE — Progress Notes (Signed)
HEMATOLOGY/ONCOLOGY INPATIENT PROGRESS NOTE  Date of Service: 02/19/2016  Inpatient Attending: .Brunetta Genera, MD   SUBJECTIVE  Patient notes no new issues with his current chemotherapy. Today is cycle 3D4 of EPOCH-R. No nausea no vomiting no back pain no headaches no fevers or chills. No overt symptoms from anemia. Patient is eager to be discharge home tomorrow.  OBJECTIVE:  NAD  PHYSICAL EXAMINATION: .       Vitals:   02/19/16 2248 02/20/16 0657 02/20/16 1237 02/20/16 1313  BP: (!) 162/77 (!) 165/86 (!) 157/79 137/66  Pulse: 81 82 74 85  Resp: _0 Temp: 97.8 F (36.6 C) 98.2 F (36.8 C) 97.7 F (36.5 C) 97.6 F (36.4 C)  TempSrc: Oral Oral Oral Oral  SpO2: 97% 98% 100%   Weight:      Height:          Filed Weights   02/16/16 1410  Weight: 185 lb 6.4 oz (84.1 kg)   .Body mass index is 25.14 kg/m.  GENERAL:alert, in no acute distress and comfortable SKIN: skin color, texture, turgor are normal, no rashes or significant lesions EYES: normal, conjunctiva are pink and non-injected, sclera clear OROPHARYNX:no exudate, no erythema and lips, buccal mucosa, and tongue normal  NECK: supple, no JVD, thyroid normal size, non-tender, without nodularity LYMPH:  no palpable lymphadenopathy in the cervical, axillary or inguinal LUNGS: clear to auscultation with normal respiratory effort HEART: regular rate & rhythm,  no murmurs and no lower extremity edema ABDOMEN: abdomen soft, non-tender, normoactive bowel sounds  Musculoskeletal: no cyanosis of digits and no clubbing  PSYCH: alert & oriented x 3 with fluent speech NEURO: no focal motor/sensory deficits  MEDICAL HISTORY:      Past Medical History:  Diagnosis Date  . Cancer ([place])    Burkett Lymphoma  . Hypertension   . PAD (peripheral artery disease) ([facility]) left leg    SURGICAL HISTORY:      Past Surgical History:  Procedure Laterality Date  . IR GENERIC HISTORICAL   12/31/2015   IR FLUORO GUIDE CV LINE RIGHT 12/31/2015 Arne Cleveland, MD [facility]  . IR GENERIC HISTORICAL  12/31/2015   IR US GUIDE VASC ACCESS RIGHT 12/31/2015 Arne Cleveland, MD [facility]  . left salivary Left    removed    SOCIAL HISTORY: Social History   Social History  . Marital status: Married    Spouse name: N/A  . Number of children: N/A  . Years of education: N/A       Occupational History  . meter specialist          Social History Main Topics  . Smoking status: Former Smoker    Packs/day: 2.00    Years: 16.00    Quit date: 12/22/2005  . Smokeless tobacco: Never Used  . Alcohol use Yes     Comment: occasionally  . Drug use: No  . Sexual activity: No       Other Topics Concern  . Not on file      Social History Narrative  . No narrative on file    FAMILY HISTORY:      Family History  Problem Relation Age of Onset  . Alzheimer's disease Father 34    ALLERGIES:  has No Known Allergies.  MEDICATIONS:  Scheduled Meds: . sodium chloride   Intravenous Once  . amLODipine  10 mg Oral Daily  . aspirin EC  81 mg Oral Daily  . chlorhexidine  15 mL Mouth/Throat  BID  . enoxaparin (LOVENOX) injection  40 mg Subcutaneous Q24H  . lisinopril  20 mg Oral Daily  . mirtazapine  15 mg Oral QHS   Continuous Infusions: . sodium chloride 10 mL/hr at 02/16/16 1239   PRN Meds:.sodium chloride, acetaminophen, albuterol, alteplase, alteplase, alum & mag hydroxide-simeth, Cold Pack, diphenhydrAMINE, diphenhydrAMINE, EPINEPHrine, EPINEPHrine, EPINEPHrine, EPINEPHrine, famotidine, heparin lock flush, heparin lock flush, heparin lock flush, heparin lock flush, Hot Pack, LORazepam, methylPREDNISolone sodium succinate, ondansetron **OR** ondansetron **OR** ondansetron (ZOFRAN) IV **OR** ondansetron (ZOFRAN) IV, oxyCODONE, polyethylene glycol, senna-docusate, sodium chloride flush, sodium chloride flush, sodium chloride flush, sodium chloride  flush, sodium chloride flush  REVIEW OF SYSTEMS:    10 Point review of Systems was done is negative except as noted above.   LABORATORY DATA:  I have reviewed the data as listed  . CBC Latest Ref Rng & Units 02/20/2016 02/19/2016 02/18/2016  WBC 4.0 - 10.5 K/uL 8.4 14.8(H) 20.1(H)  Hemoglobin 13.0 - 17.0 g/dL 9.3(L) 10.1(L) 8.7(L)  Hematocrit 39.0 - 52.0 % 28.6(L) 31.6(L) 26.9(L)  Platelets 150 - 400 K/uL 510(H) 577(H) 529(H)    . CMP Latest Ref Rng & Units 02/20/2016 02/19/2016 02/18/2016  Glucose 65 - 99 mg/dL 156(H) 136(H) 163(H)  BUN 6 - 20 mg/dL 25(H) 27(H) 30(H)  Creatinine 0.61 - 1.24 mg/dL 0.85 0.86 0.90  Sodium 135 - 145 mmol/L 141 141 138  Potassium 3.5 - 5.1 mmol/L 3.7 4.0 4.2  Chloride 101 - 111 mmol/L 108 106 106  CO2 22 - 32 mmol/L _0 Calcium 8.9 - 10.3 mg/dL 9.1 9.3 9.0  Total Protein 6.5 - 8.1 g/dL 5.8(L) 6.5 6.2(L)  Total Bilirubin 0.3 - 1.2 mg/dL 0.5 0.4 0.5  Alkaline Phos 38 - 126 U/L 60 65 66  AST 15 - 41 U/L _1 ALT 17 - 63 U/L _2 RADIOGRAPHIC STUDIES: I have personally reviewed the radiological images as listed and agreed with the findings in the report.  Imaging Results  Dg Chest 2 View  Result Date: 02/06/2016 CLINICAL DATA:  Fevers. EXAM: CHEST  2 VIEW COMPARISON:  7/20/ 17 FINDINGS: There is a right chest wall port a catheter with tip in the cavoatrial junction. Normal heart size. Lung volumes are low. No pleural effusion or edema. No airspace consolidation. Asymmetric elevation of the right hemidiaphragm is again noted. IMPRESSION: No active cardiopulmonary disease. Electronically Signed   By: Kerby Moors M.D.   On: 02/06/2016 20:39   Dg Fluoro Guided Loc Of Needle/cath Tip For Spinal Inject Rt  Result Date: 02/16/2016 CLINICAL DATA:  Lumbar puncture with chemotherapy injection, diagnostic and therapeutic. Burkitt's lymphoma. EXAM: FLUOROSCOPICALLY GUIDED LUMBAR PUNCTURE FOR INTRATHECAL CHEMOTHERAPY TECHNIQUE:  Informed consent was obtained from the patient prior to the procedure, including potential complications of headache, allergy, and pain. A 'time out' was performed. With the patient prone, the lower back was prepped with Betadine. 1% Lidocaine was used for local anesthesia. Lumbar puncture was performed at the L2-3 using a 20 gauge needle with return of clear CSF. Opening pressure 14 cm of water. 10 cc of clear CSF was collected. 12 mg methotrexate with 50 mg hydrocortisone in a 10 mL solutionwas injected into the subarachnoid space. The patient tolerated the procedure well without apparent complication. FLUOROSCOPY TIME:  20 seconds IMPRESSION: Intrathecal injection of chemotherapy without complication Electronically Signed   By: Rolm Baptise M.D.   On: 02/16/2016 12:54   Dg Fluoro Guide Lumbar Puncture  Result Date: 02/16/2016 CLINICAL DATA:  Lumbar puncture with chemotherapy injection, diagnostic and therapeutic. Burkitt's lymphoma. EXAM: FLUOROSCOPICALLY GUIDED LUMBAR PUNCTURE FOR INTRATHECAL CHEMOTHERAPY TECHNIQUE: Informed consent was obtained from the patient prior to the procedure, including potential complications of headache, allergy, and pain. A 'time out' was performed. With the patient prone, the lower back was prepped with Betadine. 1% Lidocaine was used for local anesthesia. Lumbar puncture was performed at the L2-3 using a 20 gauge needle with return of clear CSF. Opening pressure 14 cm of water. 10 cc of clear CSF was collected. 12 mg methotrexate with 50 mg hydrocortisone in a 10 mL solutionwas injected into the subarachnoid space. The patient tolerated the procedure well without apparent complication. FLUOROSCOPY TIME:  20 seconds IMPRESSION: Intrathecal injection of chemotherapy without complication Electronically Signed   By: Kevin  Dover M.D.   On: 02/16/2016 12:54     ASSESSMENT & PLAN:   64-year-old Caucasian male with  #1 Stage IVB Burkitt's lymphoma with t(8;14)  translocation This appears to be involving the sacrum causing sacral and root involvement with possible neurogenic bladder. PET CT scan as noted above shows extensive involvement with lymphoma. CSF negative for involvement. Bone marrow biopsy negative for involvement by lymphoma. Patient has completed cycle 2of EPOCH-R and tolerated it well with no prohibitive toxicities. Grade 1 fatigue.  #2normocytic anemia likely related to chemotherapy. No evidence of bleeding. No overt symptoms from his anemia at this time. #3 Leucocytosis - likely from high dose steroids. Now resolved. Plan - patient receiving c3D4 EPOCH-R today. Tolerated IT methotrexate D1 -labs are stable. -continue daily cbc, bmp -started on Aspirin today - restart plavix on discharge tomorrow. -Peridex mouth washes 2times a day for a week and then twice daily. -continue current treatment -transfuse irradiated PRBC as needed for Hgb<8 or if symptomatic. -We'll need to follow up for Neulasta shot on Saturday 9/23/2017at [facility] -he will get rpt PET/CT as outpatient prior to next cycle of chemotherapy.  #4left sacral ala and S1 fracture. L5-S1 and S1-S2 left-sided nerve root compression. #5neoplasm related pain - well-controlled. Minimal use of oxycodone. Plan -Pain well controlled. -when necessary oxycodone for intermittent neoplasm related pain.  -Senna S and MiraLAX as needed. -SI joint brace and help with pain control.  #4 Hypertension, dyslipidemia, peripheral arterial disease -Continue current outpatient medications      MD MS AAHIVMS SCH CTH Hematology/Oncology Physician [facility]  (Office):       [phone] (Work cell):  [phone] (Fax):           [phone]    

## 2016-02-21 ENCOUNTER — Ambulatory Visit (HOSPITAL_BASED_OUTPATIENT_CLINIC_OR_DEPARTMENT_OTHER): Payer: PRIVATE HEALTH INSURANCE

## 2016-02-21 ENCOUNTER — Other Ambulatory Visit: Payer: Self-pay | Admitting: *Deleted

## 2016-02-21 ENCOUNTER — Other Ambulatory Visit: Payer: Self-pay | Admitting: Hematology

## 2016-02-21 VITALS — BP 139/74 | HR 105 | Temp 98.2°F | Resp 18

## 2016-02-21 DIAGNOSIS — Z5189 Encounter for other specified aftercare: Secondary | ICD-10-CM

## 2016-02-21 DIAGNOSIS — C8378 Burkitt lymphoma, lymph nodes of multiple sites: Secondary | ICD-10-CM | POA: Diagnosis not present

## 2016-02-21 MED ORDER — PEGFILGRASTIM INJECTION 6 MG/0.6ML ~~LOC~~
6.0000 mg | PREFILLED_SYRINGE | Freq: Once | SUBCUTANEOUS | Status: AC
  Administered 2016-02-21: 6 mg via SUBCUTANEOUS

## 2016-02-21 NOTE — Patient Instructions (Signed)
Pegfilgrastim injection What is this medicine? PEGFILGRASTIM (PEG fil gra stim) is a long-acting granulocyte colony-stimulating factor that stimulates the growth of neutrophils, a type of white blood cell important in the body's fight against infection. It is used to reduce the incidence of fever and infection in patients with certain types of cancer who are receiving chemotherapy that affects the bone marrow, and to increase survival after being exposed to high doses of radiation. This medicine may be used for other purposes; ask your health care provider or pharmacist if you have questions. What should I tell my health care provider before I take this medicine? They need to know if you have any of these conditions: -kidney disease -latex allergy -ongoing radiation therapy -sickle cell disease -skin reactions to acrylic adhesives (On-Body Injector only) -an unusual or allergic reaction to pegfilgrastim, filgrastim, other medicines, foods, dyes, or preservatives -pregnant or trying to get pregnant -breast-feeding How should I use this medicine? This medicine is for injection under the skin. If you get this medicine at home, you will be taught how to prepare and give the pre-filled syringe or how to use the On-body Injector. Refer to the patient Instructions for Use for detailed instructions. Use exactly as directed. Take your medicine at regular intervals. Do not take your medicine more often than directed. It is important that you put your used needles and syringes in a special sharps container. Do not put them in a trash can. If you do not have a sharps container, call your pharmacist or healthcare provider to get one. Talk to your pediatrician regarding the use of this medicine in children. While this drug may be prescribed for selected conditions, precautions do apply. Overdosage: If you think you have taken too much of this medicine contact a poison control center or emergency room at  once. NOTE: This medicine is only for you. Do not share this medicine with others. What if I miss a dose? It is important not to miss your dose. Call your doctor or health care professional if you miss your dose. If you miss a dose due to an On-body Injector failure or leakage, a new dose should be administered as soon as possible using a single prefilled syringe for manual use. What may interact with this medicine? Interactions have not been studied. Give your health care provider a list of all the medicines, herbs, non-prescription drugs, or dietary supplements you use. Also tell them if you smoke, drink alcohol, or use illegal drugs. Some items may interact with your medicine. This list may not describe all possible interactions. Give your health care provider a list of all the medicines, herbs, non-prescription drugs, or dietary supplements you use. Also tell them if you smoke, drink alcohol, or use illegal drugs. Some items may interact with your medicine. What should I watch for while using this medicine? You may need blood work done while you are taking this medicine. If you are going to need a MRI, CT scan, or other procedure, tell your doctor that you are using this medicine (On-Body Injector only). What side effects may I notice from receiving this medicine? Side effects that you should report to your doctor or health care professional as soon as possible: -allergic reactions like skin rash, itching or hives, swelling of the face, lips, or tongue -dizziness -fever -pain, redness, or irritation at site where injected -pinpoint red spots on the skin -red or dark-brown urine -shortness of breath or breathing problems -stomach or side pain, or pain   at the shoulder -swelling -tiredness -trouble passing urine or change in the amount of urine Side effects that usually do not require medical attention (report to your doctor or health care professional if they continue or are  bothersome): -bone pain -muscle pain This list may not describe all possible side effects. Call your doctor for medical advice about side effects. You may report side effects to FDA at 1-800-FDA-1088. Where should I keep my medicine? Keep out of the reach of children. Store pre-filled syringes in a refrigerator between 2 and 8 degrees C (36 and 46 degrees F). Do not freeze. Keep in carton to protect from light. Throw away this medicine if it is left out of the refrigerator for more than 48 hours. Throw away any unused medicine after the expiration date. NOTE: This sheet is a summary. It may not cover all possible information. If you have questions about this medicine, talk to your doctor, pharmacist, or health care provider.    2016, Elsevier/Gold Standard. (2014-06-06 14:30:14)  

## 2016-02-23 ENCOUNTER — Other Ambulatory Visit: Payer: Self-pay | Admitting: *Deleted

## 2016-02-23 MED ORDER — MAGIC MOUTHWASH W/LIDOCAINE: 5.0000 mL | Freq: Four times a day (QID) | ORAL | 0 refills | Status: DC

## 2016-02-26 ENCOUNTER — Emergency Department (HOSPITAL_COMMUNITY)
Admission: EM | Admit: 2016-02-26 | Discharge: 2016-02-27 | Disposition: A | Discharge status: Home / Self Care | Payer: PRIVATE HEALTH INSURANCE | Attending: Emergency Medicine | Admitting: Emergency Medicine

## 2016-02-26 DIAGNOSIS — Z79899 Other long term (current) drug therapy: Secondary | ICD-10-CM | POA: Diagnosis not present

## 2016-02-26 DIAGNOSIS — Z87891 Personal history of nicotine dependence: Secondary | ICD-10-CM | POA: Diagnosis not present

## 2016-02-26 DIAGNOSIS — Z7982 Long term (current) use of aspirin: Secondary | ICD-10-CM | POA: Insufficient documentation

## 2016-02-26 DIAGNOSIS — I10 Essential (primary) hypertension: Secondary | ICD-10-CM | POA: Diagnosis not present

## 2016-02-26 DIAGNOSIS — E876 Hypokalemia: Secondary | ICD-10-CM | POA: Diagnosis not present

## 2016-02-26 DIAGNOSIS — Z8572 Personal history of non-Hodgkin lymphomas: Secondary | ICD-10-CM | POA: Insufficient documentation

## 2016-02-26 DIAGNOSIS — R509 Fever, unspecified: Secondary | ICD-10-CM

## 2016-02-26 LAB — COMPREHENSIVE METABOLIC PANEL
ALBUMIN: 3 g/dL — AB (ref 3.5–5.0)
ALT: 15 U/L — ABNORMAL LOW (ref 17–63)
AST: 17 U/L (ref 15–41)
Alkaline Phosphatase: 70 U/L (ref 38–126)
Anion gap: 8 (ref 5–15)
BILIRUBIN TOTAL: 0.4 mg/dL (ref 0.3–1.2)
BUN: 19 mg/dL (ref 6–20)
CHLORIDE: 100 mmol/L — AB (ref 101–111)
CO2: 27 mmol/L (ref 22–32)
Calcium: 8.2 mg/dL — ABNORMAL LOW (ref 8.9–10.3)
Creatinine, Ser: 0.72 mg/dL (ref 0.61–1.24)
Glucose, Bld: 133 mg/dL — ABNORMAL HIGH (ref 65–99)
POTASSIUM: 2.9 mmol/L — AB (ref 3.5–5.1)
SODIUM: 135 mmol/L (ref 135–145)
Total Protein: 5.4 g/dL — ABNORMAL LOW (ref 6.5–8.1)

## 2016-02-26 LAB — URINALYSIS, ROUTINE W REFLEX MICROSCOPIC
Bilirubin Urine: NEGATIVE
Glucose, UA: NEGATIVE mg/dL
Hgb urine dipstick: NEGATIVE
Ketones, ur: NEGATIVE mg/dL
NITRITE: POSITIVE — AB
PH: 5.5 (ref 5.0–8.0)
Protein, ur: NEGATIVE mg/dL
SPECIFIC GRAVITY, URINE: 1.009 (ref 1.005–1.030)

## 2016-02-26 LAB — CBC WITH DIFFERENTIAL/PLATELET
BASOS PCT: 0 %
Basophils Absolute: 0 10*3/uL (ref 0.0–0.1)
EOS PCT: 2 %
Eosinophils Absolute: 0.1 10*3/uL (ref 0.0–0.7)
HEMATOCRIT: 25.7 % — AB (ref 39.0–52.0)
HEMOGLOBIN: 8.4 g/dL — AB (ref 13.0–17.0)
LYMPHS PCT: 27 %
Lymphs Abs: 1.1 10*3/uL (ref 0.7–4.0)
MCH: 26.3 pg (ref 26.0–34.0)
MCHC: 32.7 g/dL (ref 30.0–36.0)
MCV: 80.6 fL (ref 78.0–100.0)
MONOS PCT: 17 %
Monocytes Absolute: 0.7 10*3/uL (ref 0.1–1.0)
Neutro Abs: 2 10*3/uL (ref 1.7–7.7)
Neutrophils Relative %: 54 %
PLATELETS: 290 10*3/uL (ref 150–400)
RBC: 3.19 MIL/uL — AB (ref 4.22–5.81)
RDW: 17.3 % — ABNORMAL HIGH (ref 11.5–15.5)
WBC: 3.9 10*3/uL — AB (ref 4.0–10.5)

## 2016-02-26 LAB — URINE MICROSCOPIC-ADD ON: RBC / HPF: NONE SEEN RBC/hpf (ref 0–5)

## 2016-02-26 LAB — I-STAT CG4 LACTIC ACID, ED: Lactic Acid, Venous: 2.49 mmol/L (ref 0.5–1.9)

## 2016-02-26 NOTE — ED Triage Notes (Addendum)
Pt report 100.5 oral fever at home twice today. Pts last chemo was last week. Pt afebrile during triage. Pt denies cough and urinary s/s. Pt denies diarrhea today. Pt A+OX4, speaking in complete sentences.

## 2016-02-26 NOTE — ED Notes (Signed)
Notified EDP,Molpus,MD., pt. I-stat CG4 Lactic acid 2.49 and RN,John made aware.

## 2016-02-26 NOTE — ED Provider Notes (Addendum)
[place] DEPT Provider Note: Georgena Spurling, MD, FACEP  CSN: [id number] MRN: [id number] ARRIVAL: 02/26/16 at 2143   CHIEF COMPLAINT  Fever (Chemo Pt)   HISTORY OF PRESENT ILLNESS  Erik Vaughn is a 65 y.o. male with complaint of fevers to 100.5 twice today, most recently at 8:30 PM. On arrival his temperature was noted to be normal. Pt is currently undergoing chemotherapy for Burkitt's lymphoma. Pt was instructed by a nurse not to take any medication for the fever. Pt denies chills, cough, SOB, chest pain, abdominal pain, dysuria, nausea, or vomiting. He was having significant diarrhea after his chemotherapy but this has significantly improved.   Past Medical History:  Diagnosis Date  . Cancer ([place])    Burkett Lymphoma  . Hypertension   . PAD (peripheral artery disease) ([facility]) left leg    Past Surgical History:  Procedure Laterality Date  . IR GENERIC HISTORICAL  12/31/2015   IR FLUORO GUIDE CV LINE RIGHT 12/31/2015 Arne Cleveland, MD [facility]  . IR GENERIC HISTORICAL  12/31/2015   IR US GUIDE VASC ACCESS RIGHT 12/31/2015 Arne Cleveland, MD [facility]  . left salivary Left    removed    Family History  Problem Relation Age of Onset  . Alzheimer's disease Father 48    Social History  Substance Use Topics  . Smoking status: Former Smoker    Packs/day: 2.00    Years: 16.00    Quit date: 12/22/2005  . Smokeless tobacco: Never Used  . Alcohol use Yes     Comment: occasionally    Prior to Admission medications   Medication Sig Start Date End Date Taking? Authorizing Provider  amLODipine (NORVASC) 10 MG tablet Take 10 mg by mouth daily.   Yes Historical Provider, MD  aspirin EC 81 MG tablet Take 81 mg by mouth daily.   Yes Historical Provider, MD  Cholecalciferol (VITAMIN D3) 2000 units TABS Take 2,000 Units by mouth daily with lunch.    Yes Historical Provider, MD  clopidogrel (PLAVIX) 75 MG tablet Take 75 mg by mouth daily.   Yes Historical Provider, MD    lisinopril (PRINIVIL,ZESTRIL) 20 MG tablet Take 20 mg by mouth daily.   Yes Historical Provider, MD  LORazepam (ATIVAN) 0.5 MG tablet Take 1 tablet (0.5 mg total) by mouth every 6 (six) hours as needed (Nausea or vomiting). Patient taking differently: Take 0.5 mg by mouth every 8 (eight) hours as needed for anxiety.  01/05/16  Yes Brunetta Genera, MD  magic mouthwash w/lidocaine SOLN Take 5 mLs by mouth 4 (four) times daily. 02/23/16  Yes Brunetta Genera, MD  mirtazapine (REMERON) 15 MG tablet Take 1 tablet (15 mg total) by mouth at bedtime. 02/10/16  Yes Gautam Juleen China, MD  Multiple Vitamins-Minerals (CENTRUM SILVER PO) Take 1 tablet by mouth daily with breakfast.    Yes Historical Provider, MD  oxyCODONE (OXY IR/ROXICODONE) 5 MG immediate release tablet Take 1-2 tablets (5-10 mg total) by mouth every 6 (six) hours as needed for moderate pain or severe pain. 01/20/16  Yes Brunetta Genera, MD  vitamin C (ASCORBIC ACID) 500 MG tablet Take 500 mg by mouth 2 (two) times daily.   Yes Historical Provider, MD  cephALEXin (KEFLEX) 500 MG capsule Take 1 capsule (500 mg total) by mouth 2 (two) times daily. 02/27/16   Sonam Huelsmann, MD  dexamethasone (DECADRON) 4 MG tablet Take 2 tablets (8 mg total) by mouth 2 (two) times daily with a meal. Take two times  a day starting the day after chemotherapy for 3 days. 01/05/16   Brunetta Genera, MD  ondansetron (ZOFRAN) 8 MG tablet Take 1 tablet (8 mg total) by mouth 2 (two) times daily. Take two times a day starting the day after chemo for 3 days. Then take two times a day as needed for nausea or vomiting. 01/05/16   Brunetta Genera, MD  polyethylene glycol [facility]) packet Take 17 g by mouth daily as needed for moderate constipation. 02/08/16   Geradine Girt, DO  prochlorperazine (COMPAZINE) 10 MG tablet Take 1 tablet (10 mg total) by mouth every 6 (six) hours as needed (Nausea or vomiting). 01/05/16   Brunetta Genera, MD  senna-docusate (SENNA S)  8.6-50 MG tablet Take 2 tablets by mouth at bedtime as needed for moderate constipation. 02/08/16   Geradine Girt, DO    Allergies Review of patient's allergies indicates no known allergies.   REVIEW OF SYSTEMS  Negative except as noted here or in the History of Present Illness.   PHYSICAL EXAMINATION  Initial Vital Signs Blood pressure 123/68, pulse 90, temperature 98.5 F (36.9 C), temperature source Oral, resp. rate 18, SpO2 93 %.  Examination General: Well-developed, well-nourished male in no acute distress; appearance consistent with age of record HENT: normocephalic; atraumatic Eyes: pupils equal, round and reactive to light; extraocular muscles intact Neck: supple Heart: regular rate and rhythm; holosystilc murmur at RUSB Lungs: clear to auscultation bilaterally Chest: Port-A-cath R upper chest Abdomen: soft; nondistended; nontender; no masses or hepatosplenomegaly; bowel sounds present Extremities: No deformity; full range of motion; pulses normal; compression stockings on lower legs.  Neurologic: Awake, alert and oriented; motor function intact in all extremities and symmetric; no facial droop Skin: Warm and dry Psychiatric: Normal mood and affect   RESULTS  Summary of this visit's results, reviewed by myself:   EKG Interpretation  Date/Time:    Ventricular Rate:    PR Interval:    QRS Duration:   QT Interval:    QTC Calculation:   R Axis:     Text Interpretation:        Laboratory Studies: Results for orders placed or performed during the hospital encounter of 02/26/16 (from the past 24 hour(s))  Comprehensive metabolic panel     Status: Abnormal   Collection Time: 02/26/16 10:22 PM  Result Value Ref Range   Sodium 135 135 - 145 mmol/L   Potassium 2.9 (L) 3.5 - 5.1 mmol/L   Chloride 100 (L) 101 - 111 mmol/L   CO2 27 22 - 32 mmol/L   Glucose, Bld 133 (H) 65 - 99 mg/dL   BUN 19 6 - 20 mg/dL   Creatinine, Ser 0.72 0.61 - 1.24 mg/dL   Calcium 8.2 (L)  8.9 - 10.3 mg/dL   Total Protein 5.4 (L) 6.5 - 8.1 g/dL   Albumin 3.0 (L) 3.5 - 5.0 g/dL   AST 17 15 - 41 U/L   ALT 15 (L) 17 - 63 U/L   Alkaline Phosphatase 70 38 - 126 U/L   Total Bilirubin 0.4 0.3 - 1.2 mg/dL   GFR calc non Af Amer >60 >60 mL/min   GFR calc Af Amer >60 >60 mL/min   Anion gap 8 5 - 15  CBC with Differential/Platelet     Status: Abnormal   Collection Time: 02/26/16 10:22 PM  Result Value Ref Range   WBC 3.9 (L) 4.0 - 10.5 K/uL   RBC 3.19 (L) 4.22 - 5.81 MIL/uL  Hemoglobin 8.4 (L) 13.0 - 17.0 g/dL   HCT 25.7 (L) 39.0 - 52.0 %   MCV 80.6 78.0 - 100.0 fL   MCH 26.3 26.0 - 34.0 pg   MCHC 32.7 30.0 - 36.0 g/dL   RDW 17.3 (H) 11.5 - 15.5 %   Platelets 290 150 - 400 K/uL   Neutrophils Relative % 54 %   Lymphocytes Relative 27 %   Monocytes Relative 17 %   Eosinophils Relative 2 %   Basophils Relative 0 %   Neutro Abs 2.0 1.7 - 7.7 K/uL   Lymphs Abs 1.1 0.7 - 4.0 K/uL   Monocytes Absolute 0.7 0.1 - 1.0 K/uL   Eosinophils Absolute 0.1 0.0 - 0.7 K/uL   Basophils Absolute 0.0 0.0 - 0.1 K/uL   Smear Review LARGE PLATELETS PRESENT   Urinalysis, Routine w reflex microscopic     Status: Abnormal   Collection Time: 02/26/16 10:43 PM  Result Value Ref Range   Color, Urine YELLOW YELLOW   APPearance CLOUDY (A) CLEAR   Specific Gravity, Urine 1.009 1.005 - 1.030   pH 5.5 5.0 - 8.0   Glucose, UA NEGATIVE NEGATIVE mg/dL   Hgb urine dipstick NEGATIVE NEGATIVE   Bilirubin Urine NEGATIVE NEGATIVE   Ketones, ur NEGATIVE NEGATIVE mg/dL   Protein, ur NEGATIVE NEGATIVE mg/dL   Nitrite POSITIVE (A) NEGATIVE   Leukocytes, UA SMALL (A) NEGATIVE  Urine microscopic-add on     Status: Abnormal   Collection Time: 02/26/16 10:43 PM  Result Value Ref Range   Squamous Epithelial / LPF 0-5 (A) NONE SEEN   WBC, UA 0-5 0 - 5 WBC/hpf   RBC / HPF NONE SEEN 0 - 5 RBC/hpf   Bacteria, UA MANY (A) NONE SEEN  I-Stat CG4 Lactic Acid, ED     Status: Abnormal   Collection Time: 02/26/16 10:45  PM  Result Value Ref Range   Lactic Acid, Venous 2.49 (HH) 0.5 - 1.9 mmol/L   Imaging Studies: No results found.  ED COURSE  Nursing notes and initial vitals signs, including pulse oximetry, reviewed.  Vitals:   02/26/16 2154 02/26/16 2255 02/26/16 2316 02/27/16 0004  BP: 115/62 123/68  124/63  Pulse: 102 90  93  Resp: '16 18  18  '$ Temp: 98.5 F (36.9 C)     TempSrc: Oral     SpO2: 97% 93% 93% 93%    PROCEDURES    ED DIAGNOSES  Discussed with Dr. Burr Medico of oncology. She feels patient is stable for discharge. She recommends treating for possible early UTI. He will follow up with his oncologist, Dr. Irene Limbo. He is not due for another round of chemotherapy until 03/08/16.    ICD-9-CM ICD-10-CM   1. Febrile illness 780.60 R50.9   2. Hypokalemia 276.8 E87.6        Shanon Rosser, MD 02/27/16 0041    Shanon Rosser, MD 02/27/16 [phone]

## 2016-02-27 ENCOUNTER — Other Ambulatory Visit (HOSPITAL_COMMUNITY): Payer: Self-pay | Admitting: *Deleted

## 2016-02-27 MED ORDER — DEXTROSE 5 % IV SOLN
1.0000 g | Freq: Once | INTRAVENOUS | Status: AC
  Administered 2016-02-27: 1 g via INTRAVENOUS
  Filled 2016-02-27: qty 10

## 2016-02-27 MED ORDER — HEPARIN SOD (PORK) LOCK FLUSH 100 UNIT/ML IV SOLN
500.0000 [IU] | Freq: Once | INTRAVENOUS | Status: AC
Start: 2016-02-27 — End: 2016-02-27
  Administered 2016-02-27: 500 [IU]

## 2016-02-27 MED ORDER — HEPARIN SOD (PORK) LOCK FLUSH 100 UNIT/ML IV SOLN
INTRAVENOUS | Status: AC
  Filled 2016-02-27: qty 5

## 2016-02-27 MED ORDER — POTASSIUM CHLORIDE CRYS ER 20 MEQ PO TBCR
40.0000 meq | EXTENDED_RELEASE_TABLET | Freq: Once | ORAL | Status: AC
  Administered 2016-02-27: 40 meq via ORAL
  Filled 2016-02-27: qty 2

## 2016-02-27 MED ORDER — CEPHALEXIN 500 MG PO CAPS: 500.0000 mg | ORAL_CAPSULE | Freq: Two times a day (BID) | ORAL | 0 refills | Status: DC

## 2016-02-27 MED ORDER — POTASSIUM CHLORIDE CRYS ER 20 MEQ PO TBCR: 20.0000 meq | EXTENDED_RELEASE_TABLET | Freq: Two times a day (BID) | ORAL | 0 refills | Status: DC

## 2016-02-27 NOTE — ED Notes (Signed)
Pt. Refused lab draw. RN, John in room with pt.

## 2016-02-27 NOTE — ED Notes (Signed)
Pt wanted to leave, only half bag of antibiotics administered.  Port de accessed and d/c

## 2016-02-29 LAB — URINE CULTURE: Culture: >=100000 — AB

## 2016-03-01 ENCOUNTER — Telehealth (HOSPITAL_BASED_OUTPATIENT_CLINIC_OR_DEPARTMENT_OTHER): Payer: Self-pay | Admitting: Emergency Medicine

## 2016-03-01 NOTE — Telephone Encounter (Signed)
Post ED Visit - Positive Culture Follow-up  Culture report reviewed by antimicrobial stewardship pharmacist:  '[]'$  Elenor Quinones, Pharm.D. '[]'$  Heide Guile, Pharm.D., BCPS '[]'$  Parks Neptune, Pharm.D. '[]'$  Alycia Rossetti, Pharm.D., BCPS '[]'$  [place], Pharm.D., BCPS, AAHIVP '[x]'$  Legrand Como, Pharm.D., BCPS, AAHIVP '[]'$  Cassie Stewart, Pharm.D. '[]'$  Stephens November, Pharm.D.  Positive urine culture Treated with cephalexin, organism sensitive to the same and no further patient follow-up is required at this time.  Hazle Nordmann 03/01/2016, 1:00 PM

## 2016-03-02 ENCOUNTER — Encounter (HOSPITAL_COMMUNITY)
Admission: RE | Admit: 2016-03-02 | Discharge: 2016-03-02 | Disposition: A | Discharge status: Home / Self Care | Payer: PRIVATE HEALTH INSURANCE | Source: Ambulatory Visit | Attending: Hematology | Admitting: Hematology

## 2016-03-02 ENCOUNTER — Other Ambulatory Visit: Payer: Self-pay | Admitting: *Deleted

## 2016-03-02 DIAGNOSIS — C8378 Burkitt lymphoma, lymph nodes of multiple sites: Secondary | ICD-10-CM | POA: Diagnosis present

## 2016-03-02 DIAGNOSIS — C8338 Diffuse large B-cell lymphoma, lymph nodes of multiple sites: Secondary | ICD-10-CM

## 2016-03-02 LAB — GLUCOSE, CAPILLARY: Glucose-Capillary: 129 mg/dL — ABNORMAL HIGH (ref 65–99)

## 2016-03-02 MED ORDER — LORAZEPAM 0.5 MG PO TABS: 0.5000 mg | ORAL_TABLET | Freq: Four times a day (QID) | ORAL | 0 refills | Status: DC | PRN

## 2016-03-02 MED ORDER — FLUDEOXYGLUCOSE F - 18 (FDG) INJECTION
9.1800 | Freq: Once | INTRAVENOUS | Status: AC | PRN
  Administered 2016-03-02: 9.18 via INTRAVENOUS

## 2016-03-02 MED ORDER — FLUDEOXYGLUCOSE F - 18 (FDG) INJECTION: 9.1800 | Freq: Once | INTRAVENOUS | Status: DC | PRN

## 2016-03-02 MED ORDER — OXYCODONE HCL 5 MG PO TABS: 5.0000 mg | ORAL_TABLET | Freq: Four times a day (QID) | ORAL | 0 refills | Status: DC | PRN

## 2016-03-03 LAB — CULTURE, BLOOD (ROUTINE X 2)
CULTURE: NO GROWTH
Culture: NO GROWTH

## 2016-03-04 ENCOUNTER — Other Ambulatory Visit: Payer: Self-pay | Admitting: *Deleted

## 2016-03-04 DIAGNOSIS — C8370 Burkitt lymphoma, unspecified site: Secondary | ICD-10-CM

## 2016-03-05 ENCOUNTER — Encounter: Payer: Self-pay | Admitting: Hematology

## 2016-03-05 ENCOUNTER — Ambulatory Visit (HOSPITAL_BASED_OUTPATIENT_CLINIC_OR_DEPARTMENT_OTHER): Payer: PRIVATE HEALTH INSURANCE | Admitting: Hematology

## 2016-03-05 ENCOUNTER — Other Ambulatory Visit (HOSPITAL_BASED_OUTPATIENT_CLINIC_OR_DEPARTMENT_OTHER): Payer: PRIVATE HEALTH INSURANCE

## 2016-03-05 ENCOUNTER — Other Ambulatory Visit (HOSPITAL_COMMUNITY)
Admission: AD | Admit: 2016-03-05 | Discharge: 2016-03-05 | Disposition: A | Discharge status: Home / Self Care | Payer: PRIVATE HEALTH INSURANCE | Source: Ambulatory Visit | Attending: Hematology | Admitting: Hematology

## 2016-03-05 VITALS — BP 125/72 | HR 100 | Temp 98.1°F | Resp 18 | Wt 177.7 lb

## 2016-03-05 DIAGNOSIS — B9620 Unspecified Escherichia coli [E. coli] as the cause of diseases classified elsewhere: Secondary | ICD-10-CM

## 2016-03-05 DIAGNOSIS — D649 Anemia, unspecified: Secondary | ICD-10-CM

## 2016-03-05 DIAGNOSIS — I10 Essential (primary) hypertension: Secondary | ICD-10-CM

## 2016-03-05 DIAGNOSIS — N390 Urinary tract infection, site not specified: Secondary | ICD-10-CM

## 2016-03-05 DIAGNOSIS — C8378 Burkitt lymphoma, lymph nodes of multiple sites: Secondary | ICD-10-CM | POA: Insufficient documentation

## 2016-03-05 DIAGNOSIS — G893 Neoplasm related pain (acute) (chronic): Secondary | ICD-10-CM | POA: Diagnosis not present

## 2016-03-05 DIAGNOSIS — R197 Diarrhea, unspecified: Secondary | ICD-10-CM

## 2016-03-05 DIAGNOSIS — C8370 Burkitt lymphoma, unspecified site: Secondary | ICD-10-CM

## 2016-03-05 LAB — CBC & DIFF AND RETIC
BASO%: 0.6 % (ref 0.0–2.0)
Basophils Absolute: 0.1 10*3/uL (ref 0.0–0.1)
EOS ABS: 0.1 10*3/uL (ref 0.0–0.5)
EOS%: 0.5 % (ref 0.0–7.0)
HCT: 33.4 % — ABNORMAL LOW (ref 38.4–49.9)
HGB: 10.7 g/dL — ABNORMAL LOW (ref 13.0–17.1)
IMMATURE RETIC FRACT: 17.1 % — AB (ref 3.00–10.60)
LYMPH%: 8.5 % — AB (ref 14.0–49.0)
MCH: 25.8 pg — ABNORMAL LOW (ref 27.2–33.4)
MCHC: 32 g/dL (ref 32.0–36.0)
MCV: 80.5 fL (ref 79.3–98.0)
MONO#: 0.8 10*3/uL (ref 0.1–0.9)
MONO%: 5.3 % (ref 0.0–14.0)
NEUT%: 85.1 % — ABNORMAL HIGH (ref 39.0–75.0)
NEUTROS ABS: 13.2 10*3/uL — AB (ref 1.5–6.5)
NRBC: 0 % (ref 0–0)
PLATELETS: 253 10*3/uL (ref 140–400)
RBC: 4.15 10*6/uL — AB (ref 4.20–5.82)
RDW: 18.5 % — AB (ref 11.0–14.6)
RETIC CT ABS: 154.38 10*3/uL — AB (ref 34.80–93.90)
Retic %: 3.72 % — ABNORMAL HIGH (ref 0.80–1.80)
WBC: 15.5 10*3/uL — AB (ref 4.0–10.3)
lymph#: 1.3 10*3/uL (ref 0.9–3.3)

## 2016-03-05 LAB — COMPREHENSIVE METABOLIC PANEL
ALT: 18 U/L (ref 0–55)
ANION GAP: 11 meq/L (ref 3–11)
AST: 15 U/L (ref 5–34)
Albumin: 3.4 g/dL — ABNORMAL LOW (ref 3.5–5.0)
Alkaline Phosphatase: 116 U/L (ref 40–150)
BUN: 12.1 mg/dL (ref 7.0–26.0)
CHLORIDE: 102 meq/L (ref 98–109)
CO2: 26 meq/L (ref 22–29)
Calcium: 9.9 mg/dL (ref 8.4–10.4)
Creatinine: 0.8 mg/dL (ref 0.7–1.3)
GLUCOSE: 114 mg/dL (ref 70–140)
Potassium: 4.2 mEq/L (ref 3.5–5.1)
SODIUM: 139 meq/L (ref 136–145)
Total Bilirubin: 0.41 mg/dL (ref 0.20–1.20)
Total Protein: 6.9 g/dL (ref 6.4–8.3)

## 2016-03-05 LAB — C DIFFICILE QUICK SCREEN W PCR REFLEX
C DIFFICILE (CDIFF) INTERP: NOT DETECTED
C DIFFICILE (CDIFF) TOXIN: NEGATIVE
C DIFFICLE (CDIFF) ANTIGEN: NEGATIVE

## 2016-03-05 LAB — LACTATE DEHYDROGENASE: LDH: 238 U/L (ref 125–245)

## 2016-03-05 NOTE — Progress Notes (Signed)
Erik Kitchen    HEMATOLOGY/ONCOLOGY CLINIC NOTE  Date of Service: 03/05/2016  Patient Care Team: Ernestene Kiel, MD as PCP - General (Internal Medicine) [name], MD as Consulting Physician (Orthopedic Surgery)  CHIEF COMPLAINTS/PURPOSE OF CONSULTATION:  F/u for Burkitt's lymphoma  DIAGNOSIS Stage IVB Burkitt's lymphoma with t(8;14) translocation diagnosed 12/23/2015. This appears to be involving the sacrum causing sacral and root involvement with possible neurogenic bladder. PET CT scan as noted above shows extensive involvement with lymphoma. CSF negative for involvement. Bone marrow biopsy negative for involvement by lymphoma.   CURRENT TREATMENT The patient has completed 3 cycles of EPOCH -R  Planned treatment 6 cycles of EPOCH-R + IT methotrexate x 4 doses starting with cycle 3 for CNS prophylaxis   HISTORY OF PRESENTING ILLNESS:  Please see my previous note for details  INTERVAL HISTORY  Erik Vaughn is here for a follow-up with his wife. He had fever and was in the emergency room on 02/26/2016 with an Escherichia coli urinary tract infection and was given prescription for Keflex. He has had some diarrhea which is now resolving. We'll send stool studies since she still has some stools. No fevers or chills. No abdominal pain or flank pain. His PET/CT scan looks much improved after 3 cycles of treatment. We will plan to continue his treatment and admit him for his fourth cycle of EPOCH-R on 03/08/2016. No other acute new symptoms.  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   He smokes about 2 packs a day for 40 years.  SURGICAL HISTORY:  #1 left submandibular salivary gland benign tumor excision #2 port placement - 12/31/2015  SOCIAL HISTORY: Social History   Social History  .  Marital status: Married    Spouse name: N/A  . Number of children: N/A  . Years of education: N/A   Occupational History  . meter specialist    Social History Main Topics  . Smoking status: Former Smoker    Packs/day: 2.00    Years: 16.00    Quit date: 12/22/2005  . Smokeless tobacco: Never Used  . Alcohol use Yes     Comment: occasionally  . Drug use: No  . Sexual activity: No   Other Topics Concern  . Not on file   Social History Narrative  . No narrative on file  smoked 2 packs of cigarettes per day for 40 years quit about 8-9 years ago. Denies significant alcohol use. 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  FAMILY HISTORY:  Sister had breast cancer at age 708 years. Unknown if she had genetic testing One maternal uncle had melanoma Second maternal uncle had pancreatic cancer under the age of 10yrPaternal uncle with prostate cancer Dad had prostate cancer at age 7055years   ALLERGIES:  has No Known Allergies.  MEDICATIONS:  Current Outpatient Prescriptions  Medication Sig Dispense Refill  . amLODipine (NORVASC) 10 MG tablet Take 10 mg by mouth daily.    .Erik Kitchenaspirin EC 81 MG tablet Take 81 mg by mouth daily.    . cephALEXin (KEFLEX) 500 MG capsule Take 1 capsule (500 mg total) by mouth 2 (two) times daily. 14 capsule 0  .  Cholecalciferol (VITAMIN D3) 2000 units TABS Take 2,000 Units by mouth daily with lunch.     . clopidogrel (PLAVIX) 75 MG tablet Take 75 mg by mouth daily.    Erik Kitchen dexamethasone (DECADRON) 4 MG tablet Take 2 tablets (8 mg total) by mouth 2 (two) times daily with a meal. Take two times a day starting the day after chemotherapy for 3 days. 30 tablet 1  . lisinopril (PRINIVIL,ZESTRIL) 20 MG tablet Take 20 mg by mouth daily.    Erik Kitchen LORazepam (ATIVAN) 0.5 MG tablet Take 1 tablet (0.5 mg  total) by mouth every 6 (six) hours as needed (Nausea or vomiting). 30 tablet 0  . magic mouthwash w/lidocaine SOLN Take 5 mLs by mouth 4 (four) times daily. 120 mL 0  . mirtazapine (REMERON) 15 MG tablet Take 1 tablet (15 mg total) by mouth at bedtime. 30 tablet 2  . Multiple Vitamins-Minerals (CENTRUM SILVER PO) Take 1 tablet by mouth daily with breakfast.     . ondansetron (ZOFRAN) 8 MG tablet Take 1 tablet (8 mg total) by mouth 2 (two) times daily. Take two times a day starting the day after chemo for 3 days. Then take two times a day as needed for nausea or vomiting. 30 tablet 1  . oxyCODONE (OXY IR/ROXICODONE) 5 MG immediate release tablet Take 1-2 tablets (5-10 mg total) by mouth every 6 (six) hours as needed for moderate pain or severe pain. 60 tablet 0  . polyethylene glycol (MIRALAX) packet Take 17 g by mouth daily as needed for moderate constipation.    . potassium chloride SA (K-DUR,KLOR-CON) 20 MEQ tablet Take 1 tablet (20 mEq total) by mouth 2 (two) times daily. 20 tablet 0  . prochlorperazine (COMPAZINE) 10 MG tablet Take 1 tablet (10 mg total) by mouth every 6 (six) hours as needed (Nausea or vomiting). 30 tablet 1  . senna-docusate (SENNA S) 8.6-50 MG tablet Take 2 tablets by mouth at bedtime as needed for moderate constipation.    . vitamin C (ASCORBIC ACID) 500 MG tablet Take 500 mg by mouth 2 (two) times daily.     No current facility-administered medications for this visit.    Facility-Administered Medications Ordered in Other Visits  Medication Dose Route Frequency Provider Last Rate Last Dose  . fludeoxyglucose F - 18 (FDG) injection 9.89 millicurie  2.11 millicurie Intravenous Once PRN [employer]., MD        REVIEW OF SYSTEMS:    10 Point review of Systems was done is negative except as noted above.  PHYSICAL EXAMINATION: ECOG PERFORMANCE STATUS: 1 - Symptomatic but completely ambulatory  . Vitals:   03/05/16 1005  BP: 125/72  Pulse: 100  Resp: 18    Temp: 98.1 F (36.7 C)   Filed Weights   03/05/16 1005  Weight: 177 lb 11.2 oz (80.6 kg)   .Body mass index is 24.1 kg/m.  GENERAL:alert, in no acute distress and comfortable SKIN: skin color, texture, turgor are normal, no rashes or significant lesions EYES: normal, conjunctiva are pink and non-injected, sclera clear OROPHARYNX:no exudate, no erythema and lips, buccal mucosa, and tongue normal  NECK: supple, no JVD, thyroid normal size, non-tender, without nodularity LYMPH:  no palpable lymphadenopathy in the cervical, axillary or inguinal LUNGS: clear to auscultation with normal respiratory effort, large left anterior chest wall mass noted appears nontender and fixed. HEART: regular rate & rhythm,  no murmurs and no lower extremity edema ABDOMEN: abdomen soft, non-tender, normoactive bowel sounds  Musculoskeletal: no cyanosis  of digits and no clubbing  PSYCH: alert & oriented x 3 with fluent speech NEURO: no focal motor/sensory deficits  LABORATORY DATA:  I have reviewed the data as listed  . CBC Latest Ref Rng & Units 03/05/2016 02/26/2016 02/20/2016  WBC 4.0 - 10.3 10e3/uL 15.5(H) 3.9(L) 8.4  Hemoglobin 13.0 - 17.1 g/dL 10.7(L) 8.4(L) 9.3(L)  Hematocrit 38.4 - 49.9 % 33.4(L) 25.7(L) 28.6(L)  Platelets 140 - 400 10e3/uL 253 290 510(H)   . CBC    Component Value Date/Time   WBC 15.5 (H) 03/05/2016 0941   WBC 3.9 (L) 02/26/2016 2222   RBC 4.15 (L) 03/05/2016 0941   RBC 3.19 (L) 02/26/2016 2222   HGB 10.7 (L) 03/05/2016 0941   HCT 33.4 (L) 03/05/2016 0941   PLT 253 03/05/2016 0941   MCV 80.5 03/05/2016 0941   MCH 25.8 (L) 03/05/2016 0941   MCH 26.3 02/26/2016 2222   MCHC 32.0 03/05/2016 0941   MCHC 32.7 02/26/2016 2222   RDW 18.5 (H) 03/05/2016 0941   LYMPHSABS 1.3 03/05/2016 0941   MONOABS 0.8 03/05/2016 0941   EOSABS 0.1 03/05/2016 0941   BASOSABS 0.1 03/05/2016 0941   . Lab Results  Component Value Date   LDH 238 03/05/2016   . CMP Latest Ref Rng & Units  03/05/2016 02/26/2016 02/20/2016  Glucose 70 - 140 mg/dl 114 133(H) 156(H)  BUN 7.0 - 26.0 mg/dL 12.1 19 25(H)  Creatinine 0.7 - 1.3 mg/dL 0.8 0.72 0.85  Sodium 136 - 145 mEq/L 139 135 141  Potassium 3.5 - 5.1 mEq/L 4.2 2.9(L) 3.7  Chloride 101 - 111 mmol/L - 100(L) 108  CO2 22 - 29 mEq/L 26 27 26   Calcium 8.4 - 10.4 mg/dL 9.9 8.2(L) 9.1  Total Protein 6.4 - 8.3 g/dL 6.9 5.4(L) 5.8(L)  Total Bilirubin 0.20 - 1.20 mg/dL 0.41 0.4 0.5  Alkaline Phos 40 - 150 U/L 116 70 60  AST 5 - 34 U/L 15 17 15   ALT 0 - 55 U/L 18 15(L) 18   . Lab Results  Component Value Date   LDH 238 03/05/2016    RADIOGRAPHIC STUDIES: I have personally reviewed the radiological images as listed and agreed with the findings in the report. Dg Chest 2 View  Result Date: 02/06/2016 CLINICAL DATA:  Fevers. EXAM: CHEST  2 VIEW COMPARISON:  7/20/ 17 FINDINGS: There is a right chest wall port a catheter with tip in the cavoatrial junction. Normal heart size. Lung volumes are low. No pleural effusion or edema. No airspace consolidation. Asymmetric elevation of the right hemidiaphragm is again noted. IMPRESSION: No active cardiopulmonary disease. Electronically Signed   By: Kerby Moors M.D.   On: 02/06/2016 20:39   Nm Pet Image Restag (ps) Skull Base To Thigh  Result Date: 03/02/2016 CLINICAL DATA:  Subsequent treatment strategy for Burkitt's lymphoma status post 3 cycles chemotherapy. EXAM: NUCLEAR MEDICINE PET SKULL BASE TO THIGH TECHNIQUE: 9.2 mCi F-18 FDG was injected intravenously. Full-ring PET imaging was performed from the skull base to thigh after the radiotracer. CT data was obtained and used for attenuation correction and anatomic localization. FASTING BLOOD GLUCOSE:  Value: 129 mg/dl COMPARISON:  12/25/2015 PET-CT. FINDINGS: NECK No hypermetabolic lymph nodes in the neck. CHEST No hypermetabolic or enlarged axillary, mediastinal or hilar nodes. Previously described hypermetabolic high left mediastinal node has  resolved. Previously visualized left hilar hypermetabolic node has resolved. Mild centrilobular emphysema. Solid 5 mm right middle lobe pulmonary nodule (series 8/image 35) is stable and below PET resolution.  No acute consolidative airspace disease or additional significant pulmonary nodules. Previously described hypermetabolic 1.3 cm central right upper lobe pulmonary nodule has resolved. Right internal jugular MediPort terminates near the cavoatrial junction. Left anterior descending, left circumflex and right coronary atherosclerosis. Atherosclerotic nonaneurysmal thoracic aorta. No pleural effusions. ABDOMEN/PELVIS Hypermetabolic irregular 5.7 x 2.7 cm central mesenteric nodal conglomerate (series 4/image 151) with max SUV 4.9, previously 10.6 x 7.1 cm with max SUV 41.5, significantly decreased in size and metabolism. No additional pathologically enlarged or hypermetabolic lymph nodes in the abdomen or pelvis. Previously described hypermetabolic left pelvic adenopathy has resolved. No abnormal hypermetabolic activity within the liver, pancreas, adrenal glands, or spleen. Moderate hiatal hernia. Stable diffuse bladder wall thickening. Bladder is nearly collapsed. Stable moderately enlarged prostate with nonspecific internal prostatic calcifications. Atherosclerotic nonaneurysmal abdominal aorta. SKELETON There is mild diffuse marrow hypermetabolism throughout the axial skeleton, consistent with reactive marrow state given ongoing chemotherapy. There is residual hypermetabolism (max SUV 6.3) at the site of the pathologic fracture associated with the mixed lytic and sclerotic left sacral lesion, which is increased in sclerosis on the CT images and decreased significantly in metabolism (previous max SUV 31.1). No residual skeletal hypermetabolism at the previously visualized sites in the medial left iliac bone and left superior acetabulum. No residual soft tissue component in the left posterior ninth rib lesion.  Mild residual hypermetabolism at the site of a pathologic fracture in the left posterior ninth rib (max SUV 4.3), significantly decreased in metabolism (previous max SUV 40.6). No residual soft tissue component or residual hypermetabolism at the left anterior third rib bone lesion. No residual hypermetabolism in the right anterior seventh rib or right proximal femoral lesions. No new hypermetabolic skeletal lesions. IMPRESSION: 1. Significant interval metabolic response. 2. Previously described hypermetabolic high left mediastinal and left hilar nodal disease and right upper lobe pulmonary nodule have resolved. 3. Hypermetabolic conglomerate mediastinal adenopathy is significantly decreased in size and metabolism. Previously described left pelvic hypermetabolic adenopathy has resolved. 4. Previously described hypermetabolic skeletal lesions in the right anterior seventh rib, left anterior third rib, right proximal femur and left iliac bone have resolved. Residual hypermetabolism at the sites of pathologic fractures in the left sacral and left posterior ninth rib lesions, significantly decreased in metabolism, with resolution of the soft tissue component in the left posterior ninth rib lesion. 5. No new or progressive hypermetabolic sites of disease. 6. Solitary right middle lobe 5 mm pulmonary nodule is stable and below PET resolution, probably benign. 7. Mild diffuse marrow hypermetabolism throughout the axial skeleton, consistent with reactive marrow state given ongoing chemotherapy. 8. Additional findings include aortic atherosclerosis, three-vessel coronary atherosclerosis, mild centrilobular emphysema, stable diffuse bladder wall thickening with moderately enlarged prostate, and moderate hiatal hernia. Electronically Signed   By: Ilona Sorrel M.D.   On: 03/02/2016 12:26   Dg Fluoro Guided Loc Of Needle/cath Tip For Spinal Inject Rt  Result Date: 02/16/2016 CLINICAL DATA:  Lumbar puncture with chemotherapy  injection, diagnostic and therapeutic. Burkitt's lymphoma. EXAM: FLUOROSCOPICALLY GUIDED LUMBAR PUNCTURE FOR INTRATHECAL CHEMOTHERAPY TECHNIQUE: Informed consent was obtained from the patient prior to the procedure, including potential complications of headache, allergy, and pain. A 'time out' was performed. With the patient prone, the lower back was prepped with Betadine. 1% Lidocaine was used for local anesthesia. Lumbar puncture was performed at the L2-3 using a 20 gauge needle with return of clear CSF. Opening pressure 14 cm of water. 10 cc of clear CSF was collected. 12 mg methotrexate with 50  mg hydrocortisone in a 10 mL solutionwas injected into the subarachnoid space. The patient tolerated the procedure well without apparent complication. FLUOROSCOPY TIME:  20 seconds IMPRESSION: Intrathecal injection of chemotherapy without complication Electronically Signed   By: Rolm Baptise M.D.   On: 02/16/2016 12:54   Dg Fluoro Guide Lumbar Puncture  Result Date: 02/16/2016 CLINICAL DATA:  Lumbar puncture with chemotherapy injection, diagnostic and therapeutic. Burkitt's lymphoma. EXAM: FLUOROSCOPICALLY GUIDED LUMBAR PUNCTURE FOR INTRATHECAL CHEMOTHERAPY TECHNIQUE: Informed consent was obtained from the patient prior to the procedure, including potential complications of headache, allergy, and pain. A 'time out' was performed. With the patient prone, the lower back was prepped with Betadine. 1% Lidocaine was used for local anesthesia. Lumbar puncture was performed at the L2-3 using a 20 gauge needle with return of clear CSF. Opening pressure 14 cm of water. 10 cc of clear CSF was collected. 12 mg methotrexate with 50 mg hydrocortisone in a 10 mL solutionwas injected into the subarachnoid space. The patient tolerated the procedure well without apparent complication. FLUOROSCOPY TIME:  20 seconds IMPRESSION: Intrathecal injection of chemotherapy without complication Electronically Signed   By: Rolm Baptise M.D.   On:  02/16/2016 12:54    ASSESSMENT & PLAN:   65 year old Caucasian male with  #1 Stage IVB Burkitt's lymphoma with t(8;14) translocation This appears to be involving the sacrum causing sacral and root involvement with possible neurogenic bladder. PET CT scan as noted above shows extensive involvement with lymphoma. CSF negative for involvement. Bone marrow biopsy negative for involvement by lymphoma. Patient has completed cycle 3 of EPOCH-R and tolerated it well with no prohibitive toxicities. Grade 1 fatigue.  He has had some episodes of fevers including a periodontal abscess,  non-neutropenic fevers - unclear etiology, recent Escherichia coli urinary tract infection on 02/18/2016. He is finishing his Keflex today.  #2 normocytic anemia likely related to chemotherapy.  No evidence of bleeding. Improving hemoglobin  #3 Escherichia coli urinary tract infection - 02/26/2016 - resolving .  #4 diarrhea - likely related to chemotherapy . Could also be related to his antibiotic use . Improving . -We will send out stool studies to rule out C. difficile given recurrent antibiotic use . Plan - will set up the patient for inpatient hospitalization on 03/08/2016 for cycle 4 EPOCH-R and D1 IT Methotrexate and D5 Rituxan. -Get stool studies to rule out C. difficile and GI stool panel. -That is nearly resolved -He will complete his Keflex for a UTI today. -He will be set up for intrathecal methotrexate day 1 of his cycle by IR (for CNS prophylaxis).  -Peridex mouth washes 3 times a day for a week and then twice daily. -patient was counseled on the need for absolute attention to infection prevention and to avoid crowds.  #4 left sacral ala and S1 fracture. L5-S1 and S1-S2 left-sided nerve root compression. #5 neoplasm related pain - well-controlled.  Minimal use of oxycodone. Plan -Pain well controlled. - when necessary oxycodone for intermittent neoplasm related pain. -Senna S and MiraLAX as  needed. -SI joint brace and help with pain control.  #4 Hypertension, dyslipidemia, peripheral arterial disease -Continue management as per primary care physician.  #5 ex-smoker with 80-pack-year history of smoking.  Inpatient admission for chemotherapy on 03/08/2016  I spent 30 minutes counseling the patient face to face. The total time spent in the appointment was 40 minutes and more than 50% was on counseling and direct patient cares and co-ordinating inpatient hospitalization logistics    Sullivan Lone  MD MS AAHIVMS [facility] Hematology/Oncology Physician [place]  (Office):       [phone] (Work cell):  [phone] (Fax):           [phone]

## 2016-03-08 ENCOUNTER — Inpatient Hospital Stay (HOSPITAL_COMMUNITY)
Admission: AD | Admit: 2016-03-08 | Discharge: 2016-03-12 | DRG: 847 | Disposition: A | Discharge status: Home / Self Care | Payer: PRIVATE HEALTH INSURANCE | Source: Ambulatory Visit | Attending: Hematology | Admitting: Hematology

## 2016-03-08 ENCOUNTER — Inpatient Hospital Stay (HOSPITAL_COMMUNITY): Payer: PRIVATE HEALTH INSURANCE

## 2016-03-08 ENCOUNTER — Encounter (HOSPITAL_COMMUNITY): Payer: Self-pay | Admitting: *Deleted

## 2016-03-08 ENCOUNTER — Other Ambulatory Visit: Payer: Self-pay | Admitting: *Deleted

## 2016-03-08 DIAGNOSIS — Z5111 Encounter for antineoplastic chemotherapy: Secondary | ICD-10-CM | POA: Diagnosis present

## 2016-03-08 DIAGNOSIS — I739 Peripheral vascular disease, unspecified: Secondary | ICD-10-CM | POA: Diagnosis present

## 2016-03-08 DIAGNOSIS — C8370 Burkitt lymphoma, unspecified site: Secondary | ICD-10-CM

## 2016-03-08 DIAGNOSIS — Z9221 Personal history of antineoplastic chemotherapy: Secondary | ICD-10-CM

## 2016-03-08 DIAGNOSIS — A09 Infectious gastroenteritis and colitis, unspecified: Secondary | ICD-10-CM | POA: Diagnosis present

## 2016-03-08 DIAGNOSIS — I798 Other disorders of arteries, arterioles and capillaries in diseases classified elsewhere: Secondary | ICD-10-CM | POA: Diagnosis not present

## 2016-03-08 DIAGNOSIS — I10 Essential (primary) hypertension: Secondary | ICD-10-CM | POA: Diagnosis present

## 2016-03-08 DIAGNOSIS — Z87891 Personal history of nicotine dependence: Secondary | ICD-10-CM

## 2016-03-08 DIAGNOSIS — C8378 Burkitt lymphoma, lymph nodes of multiple sites: Secondary | ICD-10-CM | POA: Diagnosis not present

## 2016-03-08 DIAGNOSIS — A000 Cholera due to Vibrio cholerae 01, biovar cholerae: Secondary | ICD-10-CM

## 2016-03-08 DIAGNOSIS — R197 Diarrhea, unspecified: Secondary | ICD-10-CM

## 2016-03-08 HISTORY — DX: Burkitt lymphoma, lymph nodes of multiple sites: C83.78

## 2016-03-08 LAB — COMPREHENSIVE METABOLIC PANEL
ALT: 16 U/L — ABNORMAL LOW (ref 17–63)
AST: 18 U/L (ref 15–41)
Albumin: 3.6 g/dL (ref 3.5–5.0)
Alkaline Phosphatase: 77 U/L (ref 38–126)
Anion gap: 4 — ABNORMAL LOW (ref 5–15)
BILIRUBIN TOTAL: 0.6 mg/dL (ref 0.3–1.2)
BUN: 11 mg/dL (ref 6–20)
CHLORIDE: 103 mmol/L (ref 101–111)
CO2: 27 mmol/L (ref 22–32)
Calcium: 9 mg/dL (ref 8.9–10.3)
Creatinine, Ser: 0.65 mg/dL (ref 0.61–1.24)
Glucose, Bld: 108 mg/dL — ABNORMAL HIGH (ref 65–99)
POTASSIUM: 3.9 mmol/L (ref 3.5–5.1)
Sodium: 134 mmol/L — ABNORMAL LOW (ref 135–145)
TOTAL PROTEIN: 6.6 g/dL (ref 6.5–8.1)

## 2016-03-08 LAB — CBC WITH DIFFERENTIAL/PLATELET
BASOS ABS: 0.1 10*3/uL (ref 0.0–0.1)
Basophils Relative: 1 %
EOS PCT: 1 %
Eosinophils Absolute: 0.1 10*3/uL (ref 0.0–0.7)
HEMATOCRIT: 30.4 % — AB (ref 39.0–52.0)
Hemoglobin: 9.7 g/dL — ABNORMAL LOW (ref 13.0–17.0)
LYMPHS ABS: 1.3 10*3/uL (ref 0.7–4.0)
Lymphocytes Relative: 9 %
MCH: 26.1 pg (ref 26.0–34.0)
MCHC: 31.9 g/dL (ref 30.0–36.0)
MCV: 81.9 fL (ref 78.0–100.0)
MONO ABS: 1 10*3/uL (ref 0.1–1.0)
MONOS PCT: 7 %
NEUTROS PCT: 82 %
Neutro Abs: 11.7 10*3/uL — ABNORMAL HIGH (ref 1.7–7.7)
PLATELETS: 382 10*3/uL (ref 150–400)
RBC: 3.71 MIL/uL — AB (ref 4.22–5.81)
RDW: 18.5 % — AB (ref 11.5–15.5)
WBC: 14.2 10*3/uL — AB (ref 4.0–10.5)

## 2016-03-08 LAB — GASTROINTESTINAL PANEL BY PCR, STOOL (REPLACES STOOL CULTURE)
ADENOVIRUS F40/41: NOT DETECTED
Astrovirus: NOT DETECTED
CAMPYLOBACTER SPECIES: NOT DETECTED
CRYPTOSPORIDIUM: NOT DETECTED
CYCLOSPORA CAYETANENSIS: NOT DETECTED
ENTAMOEBA HISTOLYTICA: NOT DETECTED
ENTEROPATHOGENIC E COLI (EPEC): NOT DETECTED
ENTEROTOXIGENIC E COLI (ETEC): NOT DETECTED
Enteroaggregative E coli (EAEC): NOT DETECTED
GIARDIA LAMBLIA: NOT DETECTED
Norovirus GI/GII: NOT DETECTED
Plesimonas shigelloides: NOT DETECTED
Rotavirus A: NOT DETECTED
SALMONELLA SPECIES: NOT DETECTED
SHIGELLA/ENTEROINVASIVE E COLI (EIEC): NOT DETECTED
Sapovirus (I, II, IV, and V): NOT DETECTED
Shiga like toxin producing E coli (STEC): NOT DETECTED
VIBRIO CHOLERAE: NOT DETECTED
VIBRIO SPECIES: NOT DETECTED
YERSINIA ENTEROCOLITICA: NOT DETECTED

## 2016-03-08 MED ORDER — LISINOPRIL 20 MG PO TABS
20.0000 mg | ORAL_TABLET | Freq: Every day | ORAL | Status: DC
  Administered 2016-03-08 – 2016-03-11 (×4): 20 mg via ORAL
  Filled 2016-03-08 (×4): qty 1

## 2016-03-08 MED ORDER — SODIUM CHLORIDE 0.9 % IV SOLN: INTRAVENOUS | Status: DC

## 2016-03-08 MED ORDER — LORAZEPAM 0.5 MG PO TABS: 0.5000 mg | ORAL_TABLET | Freq: Four times a day (QID) | ORAL | Status: DC | PRN

## 2016-03-08 MED ORDER — ACETAMINOPHEN 325 MG PO TABS: 650.0000 mg | ORAL_TABLET | ORAL | Status: DC | PRN

## 2016-03-08 MED ORDER — SODIUM CHLORIDE 0.9 % IJ SOLN
Freq: Once | INTRAMUSCULAR | Status: AC
  Administered 2016-03-08: 13:00:00 via INTRATHECAL
  Filled 2016-03-08: qty 0.48

## 2016-03-08 MED ORDER — PREDNISONE 50 MG PO TABS
60.0000 mg/m2/d | ORAL_TABLET | Freq: Every day | ORAL | Status: AC
  Administered 2016-03-08 – 2016-03-12 (×5): 125.5 mg via ORAL
  Filled 2016-03-08 (×5): qty 3

## 2016-03-08 MED ORDER — SODIUM CHLORIDE 0.9% FLUSH: 10.0000 mL | INTRAVENOUS | Status: DC | PRN

## 2016-03-08 MED ORDER — ETOPOSIDE CHEMO INJECTION 500 MG/25ML
Freq: Once | INTRAVENOUS | Status: AC
  Administered 2016-03-08: 15:00:00 via INTRAVENOUS
  Filled 2016-03-08: qty 10

## 2016-03-08 MED ORDER — MIRTAZAPINE 15 MG PO TABS
15.0000 mg | ORAL_TABLET | Freq: Every day | ORAL | Status: DC
  Administered 2016-03-08 – 2016-03-09 (×2): 15 mg via ORAL
  Filled 2016-03-08 (×2): qty 1

## 2016-03-08 MED ORDER — OXYCODONE HCL 5 MG PO TABS
5.0000 mg | ORAL_TABLET | Freq: Four times a day (QID) | ORAL | Status: DC | PRN
Start: 2016-03-08 — End: 2016-03-12

## 2016-03-08 MED ORDER — DOXYCYCLINE HYCLATE 100 MG PO TABS
300.0000 mg | ORAL_TABLET | Freq: Once | ORAL | Status: AC
  Administered 2016-03-08: 300 mg via ORAL
  Filled 2016-03-08: qty 3

## 2016-03-08 MED ORDER — ALTEPLASE 2 MG IJ SOLR: 2.0000 mg | Freq: Once | INTRAMUSCULAR | Status: DC | PRN

## 2016-03-08 MED ORDER — AMLODIPINE BESYLATE 10 MG PO TABS
10.0000 mg | ORAL_TABLET | Freq: Every day | ORAL | Status: DC
  Filled 2016-03-08 (×4): qty 1

## 2016-03-08 MED ORDER — HEPARIN SOD (PORK) LOCK FLUSH 100 UNIT/ML IV SOLN: 500.0000 [IU] | Freq: Once | INTRAVENOUS | Status: DC | PRN

## 2016-03-08 MED ORDER — SODIUM CHLORIDE 0.9% FLUSH: 3.0000 mL | INTRAVENOUS | Status: DC | PRN

## 2016-03-08 MED ORDER — HEPARIN SOD (PORK) LOCK FLUSH 100 UNIT/ML IV SOLN: 250.0000 [IU] | Freq: Once | INTRAVENOUS | Status: DC | PRN

## 2016-03-08 MED ORDER — SODIUM CHLORIDE 0.9 % IV SOLN
Freq: Once | INTRAVENOUS | Status: AC
  Administered 2016-03-08: 8 mg via INTRAVENOUS
  Filled 2016-03-08: qty 4

## 2016-03-08 MED ORDER — AMLODIPINE BESYLATE 10 MG PO TABS
10.0000 mg | ORAL_TABLET | Freq: Every day | ORAL | Status: DC
  Administered 2016-03-08 – 2016-03-11 (×4): 10 mg via ORAL
  Filled 2016-03-08 (×2): qty 1

## 2016-03-08 MED ORDER — LISINOPRIL 20 MG PO TABS
20.0000 mg | ORAL_TABLET | Freq: Every day | ORAL | Status: DC
  Filled 2016-03-08: qty 1

## 2016-03-08 NOTE — Consult Note (Signed)
[facility]       Reason for Consult: diarrhea    Referring Physician: Dr. Irene Limbo  Active Problems:   Burkitt lymphoma of lymph nodes of multiple sites [facility])   . amLODipine  10 mg Oral Daily  . amLODipine  10 mg Oral q1800  . DOXOrubicin/vinCRIStine/etoposide CHEMO IV infusion for Inpatient CI   Intravenous Once  . lisinopril  20 mg Oral q1800  . mirtazapine  15 mg Oral QHS  . predniSONE  60 mg/m2/day (Treatment Plan Recorded) Oral QAC breakfast    Recommendations: Supportive care   Assessment: He has diarrhea that started Sunday and GI pathogen panel positive for vibrio cholerae.  He has not traveled out of [place], never outside of the country and not around anyone from [place] areas to his knowledge.  No shellfish though did eat anchovy pizza.   Has not been to the coast or in the ocean.  With his lack of pretest probability, I do not suspect this is true cholera but a false positive and do not feel he is at risk and ok from an ID standpoint to continue with chemotherapy on schedule.  He regardless was treated.  I do not feel retesting is indicated.    Antibiotics: Doxycyline x 1 dose.   HPI: Erik Vaughn is a 65 y.o. male with Burkitt's lymphoma getting EPOCH-R chemotherapy who came in for his 5 round and developed diarrhea.  A GI pathogen panel was sent and came positive for V cholerae.  As above, no recent travel or exposure risk noted.  His diarrhea was described as watery with mucous and now improving, more formed.  No sick contacts, no one else now sick.  No fever, no chills.    Review of Systems:  Constitutional: negative for fevers and chills Gastrointestinal: negative for nausea and vomiting Musculoskeletal: negative for myalgias All other systems reviewed and are negative   Past Medical History:  Diagnosis Date  . Cancer ([place])    Burkett Lymphoma  . Hypertension   . PAD (peripheral artery disease) ([place]) left leg    Social  History  Substance Use Topics  . Smoking status: Former Smoker    Packs/day: 2.00    Years: 16.00    Quit date: 12/22/2005  . Smokeless tobacco: Never Used  . Alcohol use Yes     Comment: occasionally    Family History  Problem Relation Age of Onset  . Alzheimer's disease Father 12    No Known Allergies  Physical Exam: Constitutional: in no apparent distress and alert  Vitals:   03/08/16 0845 03/08/16 1514  BP: (!) 149/80 138/72  Pulse: 100 (!) 104  Resp: 18 17  Temp: 98 F (36.7 C) 98.1 F (36.7 C)   EYES: anicteric ENMT: no thrush Cardiovascular: Cor RRR and No murmurs Respiratory: CTA B; normal respiratory effort GI: Bowel sounds are normal, liver is not enlarged, spleen is not enlarged Musculoskeletal: no pedal edema noted Skin: negatives: no rash Hematologic: no cervical lad  Lab Results  Component Value Date   WBC 14.2 (H) 03/08/2016   HGB 9.7 (L) 03/08/2016   HCT 30.4 (L) 03/08/2016   MCV 81.9 03/08/2016   PLT 382 03/08/2016    Lab Results  Component Value Date   CREATININE 0.65 03/08/2016   BUN 11 03/08/2016   NA 134 (L) 03/08/2016   K 3.9 03/08/2016   CL 103 03/08/2016   CO2 27 03/08/2016    Lab Results  Component  Value Date   ALT 16 (L) 03/08/2016   AST 18 03/08/2016   ALKPHOS 77 03/08/2016     Microbiology: Recent Results (from the past 240 hour(s))  Gastrointestinal Panel by PCR , Stool     Status: Abnormal   Collection Time: 03/05/16 10:46 AM  Result Value Ref Range Status   Campylobacter species NOT DETECTED NOT DETECTED Final   Plesimonas shigelloides NOT DETECTED NOT DETECTED Final   Salmonella species NOT DETECTED NOT DETECTED Final   Yersinia enterocolitica NOT DETECTED NOT DETECTED Final   Vibrio species DETECTED (A) NOT DETECTED Final    Comment: RESULT CALLED TO, READ BACK BY AND VERIFIED WITH: ANGELA COSTA ON 03/06/16 AT 0925 BY [facility]    Vibrio cholerae DETECTED (A) NOT DETECTED Final    Comment: RESULT CALLED TO, READ BACK  BY AND VERIFIED WITH: ANGELA COSTA ON 03/06/16 AT 0925 BY [facility]    Enteroaggregative E coli (EAEC) NOT DETECTED NOT DETECTED Final   Enteropathogenic E coli (EPEC) NOT DETECTED NOT DETECTED Final   Enterotoxigenic E coli (ETEC) NOT DETECTED NOT DETECTED Final   Shiga like toxin producing E coli (STEC) NOT DETECTED NOT DETECTED Final   Shigella/Enteroinvasive E coli (EIEC) NOT DETECTED NOT DETECTED Final   Cryptosporidium NOT DETECTED NOT DETECTED Final   Cyclospora cayetanensis NOT DETECTED NOT DETECTED Final   Entamoeba histolytica NOT DETECTED NOT DETECTED Final   Giardia lamblia NOT DETECTED NOT DETECTED Final   Adenovirus F40/41 NOT DETECTED NOT DETECTED Final   Astrovirus NOT DETECTED NOT DETECTED Final   Norovirus GI/GII NOT DETECTED NOT DETECTED Final   Rotavirus A NOT DETECTED NOT DETECTED Final   Sapovirus (I, II, IV, and V) NOT DETECTED NOT DETECTED Final  C difficile quick scan w PCR reflex     Status: None   Collection Time: 03/05/16  2:00 PM  Result Value Ref Range Status   C Diff antigen NEGATIVE NEGATIVE Final   C Diff toxin NEGATIVE NEGATIVE Final   C Diff interpretation No C. difficile detected.  Final    Sayuri Rhames, Herbie [place], [place] for Infectious Disease [place] [email or web address] [phone] pager  [phone] cell 03/08/2016, 4:39 PM

## 2016-03-08 NOTE — Progress Notes (Signed)
Manual calculation of BSA and dosing for chemotherapy completed.  Verifications performed by Jena Gauss, RN and Kirkland Hun, RN.  This RN verified dose and volume as noted

## 2016-03-08 NOTE — Care Management Note (Signed)
Case Management Note  Patient Details  Name: Servando Kyllonen MRN: [id number] Date of Birth: 27-Jan-1951  Subjective/Objective:      65 yo admitted with Burkitt's Lymphoma for chemotherapy              Action/Plan: Pt from home with spouse. He has a cane if needed at home. CM following along for DC needs.  Expected Discharge Date:                  Expected Discharge Plan:  Home/Self Care  In-House Referral:     Discharge planning Services  CM Consult  Post Acute Care Choice:    Choice offered to:     DME Arranged:    DME Agency:     HH Arranged:    HH Agency:     Status of Service:  In process, will continue to follow  If discussed at Long Length of Stay Meetings, dates discussed:    Additional CommentsLynnell Catalan, RN 03/08/2016, 1:39 PM [phone]

## 2016-03-08 NOTE — Procedures (Signed)
Procedure: LP at L2/3 w Fluoro guidance and intrathecal Chemo injection. Specimen: None Bleeding: Minimal. Complications: None immediate. Patient   -Condition: Stable.  -Disposition:  Admitted, back to inpatient ward.  Full Radiology Report to Follow under IMAGING

## 2016-03-09 ENCOUNTER — Ambulatory Visit: Payer: PRIVATE HEALTH INSURANCE | Admitting: Hematology

## 2016-03-09 ENCOUNTER — Other Ambulatory Visit: Payer: PRIVATE HEALTH INSURANCE

## 2016-03-09 DIAGNOSIS — A09 Infectious gastroenteritis and colitis, unspecified: Secondary | ICD-10-CM

## 2016-03-09 LAB — COMPREHENSIVE METABOLIC PANEL
ALBUMIN: 3.6 g/dL (ref 3.5–5.0)
ALK PHOS: 78 U/L (ref 38–126)
ALT: 16 U/L — ABNORMAL LOW (ref 17–63)
ANION GAP: 8 (ref 5–15)
AST: 21 U/L (ref 15–41)
BILIRUBIN TOTAL: 0.4 mg/dL (ref 0.3–1.2)
BUN: 14 mg/dL (ref 6–20)
CALCIUM: 9.2 mg/dL (ref 8.9–10.3)
CO2: 23 mmol/L (ref 22–32)
Chloride: 106 mmol/L (ref 101–111)
Creatinine, Ser: 0.7 mg/dL (ref 0.61–1.24)
GFR calc non Af Amer: >60 mL/min (ref >60)
GLUCOSE: 165 mg/dL — AB (ref 65–99)
POTASSIUM: 4 mmol/L (ref 3.5–5.1)
SODIUM: 137 mmol/L (ref 135–145)
TOTAL PROTEIN: 6.6 g/dL (ref 6.5–8.1)

## 2016-03-09 LAB — CBC
HCT: 28.9 % — ABNORMAL LOW (ref 39.0–52.0)
HEMOGLOBIN: 9.3 g/dL — AB (ref 13.0–17.0)
MCH: 26.2 pg (ref 26.0–34.0)
MCHC: 32.2 g/dL (ref 30.0–36.0)
MCV: 81.4 fL (ref 78.0–100.0)
Platelets: 433 10*3/uL — ABNORMAL HIGH (ref 150–400)
RBC: 3.55 MIL/uL — ABNORMAL LOW (ref 4.22–5.81)
RDW: 18.6 % — AB (ref 11.5–15.5)
WBC: 21.2 10*3/uL — ABNORMAL HIGH (ref 4.0–10.5)

## 2016-03-09 MED ORDER — VINCRISTINE SULFATE CHEMO INJECTION 1 MG/ML
Freq: Once | INTRAVENOUS | Status: AC
  Administered 2016-03-09: 16:00:00 via INTRAVENOUS
  Filled 2016-03-09: qty 10

## 2016-03-09 MED ORDER — ENOXAPARIN SODIUM 30 MG/0.3ML ~~LOC~~ SOLN
30.0000 mg | SUBCUTANEOUS | Status: DC
  Administered 2016-03-09 – 2016-03-10 (×2): 30 mg via SUBCUTANEOUS
  Filled 2016-03-09 (×4): qty 0.3

## 2016-03-09 MED ORDER — ONDANSETRON HCL 40 MG/20ML IJ SOLN
Freq: Once | INTRAMUSCULAR | Status: AC
  Administered 2016-03-09: 8 mg via INTRAVENOUS
  Filled 2016-03-09: qty 4

## 2016-03-09 MED ORDER — ASPIRIN EC 81 MG PO TBEC
81.0000 mg | DELAYED_RELEASE_TABLET | Freq: Every evening | ORAL | Status: DC
Start: 2016-03-09 — End: 2016-03-12
  Administered 2016-03-09 – 2016-03-11 (×3): 81 mg via ORAL
  Filled 2016-03-09 (×3): qty 1

## 2016-03-09 NOTE — Progress Notes (Signed)
Manual calculation of BSA and dosing for chemotherapy completed.  Verification performed by Kirkland Hun RN and Jena Gauss RN

## 2016-03-09 NOTE — H&P (Signed)
[name]    HEMATOLOGY/ONCOLOGY H&P  NOTE  Date of Service:  03/18/2016 Patient Care Team: Ernestene Kiel, MD as PCP - General (Internal Medicine) [name], MD as Consulting Physician (Orthopedic Surgery)  CHIEF COMPLAINTS/PURPOSE OF CONSULTATION:  Patient admitted for fourth cycle of EPOCH-R for Burkitt's lymphoma .  HISTORY OF PRESENTING ILLNESS:   Kate Sweetman is a wonderful 65 y.o. male who has been admitted for his fourth cycle of EPOCH-R for Burkitt's lymphoma. His PET scan after 3 cycles that shown significant metabolic response.  He had diarrhea for the last week or so which is much better. His GI panel unusually showed Vibrio Cholerae which was unusual. I consulted Dr. Linus Salmons from infectious disease and this was stopped to be a false-positive. He empirically receive doxycycline 1 dose. Repeat GI panel was negative for vibrio cholera. Patient now has no significant diarrhea. No fevers no chills no other acute new concerns.  MEDICAL HISTORY:  Past Medical History:  Diagnosis Date  . Cancer ([facility])    Burkett Lymphoma  . Hypertension   . PAD (peripheral artery disease) ([facility]) left leg    SURGICAL HISTORY: Past Surgical History:  Procedure Laterality Date  . IR GENERIC HISTORICAL  12/31/2015   IR FLUORO GUIDE CV LINE RIGHT 12/31/2015 Arne Cleveland, MD [facility]  . IR GENERIC HISTORICAL  12/31/2015   IR US GUIDE VASC ACCESS RIGHT 12/31/2015 Arne Cleveland, MD [facility]  . left salivary Left    removed    SOCIAL HISTORY: Social History   Social History  . Marital status: Married    Spouse name: N/A  . Number of children: N/A  . Years of education: N/A   Occupational History  . meter specialist    Social History Main Topics  . Smoking status: Former Smoker    Packs/day: 2.00    Years: 16.00    Quit date: 12/22/2005  . Smokeless tobacco: Never Used  . Alcohol use Yes     Comment: occasionally  . Drug use: No  . Sexual activity: No   Other Topics Concern    . Not on file   Social History Narrative  . No narrative on file    FAMILY HISTORY: Family History  Problem Relation Age of Onset  . Alzheimer's disease Father 77    ALLERGIES:  has No Known Allergies.  MEDICATIONS:  Current Facility-Administered Medications  Medication Dose Route Frequency Provider Last Rate Last Dose  . 0.9 %  sodium chloride infusion   Intravenous Continuous Brunetta Genera, MD      . acetaminophen (TYLENOL) tablet 650 mg  650 mg Oral Q4H PRN Brunetta Genera, MD      . alteplase (CATHFLO ACTIVASE) injection 2 mg  2 mg Intracatheter Once PRN Brunetta Genera, MD      . amLODipine (NORVASC) tablet 10 mg  10 mg Oral Daily Brunetta Genera, MD      . amLODipine (NORVASC) tablet 10 mg  10 mg Oral q1800 Brunetta Genera, MD   10 mg at 03/08/16 1757  . DOXOrubicin (ADRIAMYCIN) 20 mg, etoposide (VEPESID) 104 mg, vinCRIStine (ONCOVIN) 0.8 mg in sodium chloride 0.9 % 600 mL chemo infusion   Intravenous Once Brunetta Genera, MD 28 mL/hr at 03/09/16 0300    . DOXOrubicin (ADRIAMYCIN) 20 mg, etoposide (VEPESID) 104 mg, vinCRIStine (ONCOVIN) 0.8 mg in sodium chloride 0.9 % 600 mL chemo infusion   Intravenous Once Brunetta Genera, MD      . heparin  lock flush 100 unit/mL  500 Units Intracatheter Once PRN Brunetta Genera, MD      . heparin lock flush 100 unit/mL  250 Units Intracatheter Once PRN Brunetta Genera, MD      . lisinopril (PRINIVIL,ZESTRIL) tablet 20 mg  20 mg Oral q1800 Brunetta Genera, MD   20 mg at 03/08/16 1757  . LORazepam (ATIVAN) tablet 0.5 mg  0.5 mg Oral Q6H PRN Brunetta Genera, MD      . mirtazapine (REMERON) tablet 15 mg  15 mg Oral QHS Brunetta Genera, MD   15 mg at 03/08/16 2215  . ondansetron (ZOFRAN) 8 mg, dexamethasone (DECADRON) 10 mg in sodium chloride 0.9 % 50 mL IVPB   Intravenous Once Brunetta Genera, MD      . oxyCODONE (Oxy IR/ROXICODONE) immediate release tablet 5-10 mg  5-10 mg Oral Q6H PRN Brunetta Genera, MD      . predniSONE (DELTASONE) tablet 125.5 mg  60 mg/m2/day (Treatment Plan Recorded) Oral QAC breakfast Brunetta Genera, MD   125.5 mg at 03/08/16 1300  . sodium chloride flush (NS) 0.9 % injection 10 mL  10 mL Intracatheter PRN Brunetta Genera, MD      . sodium chloride flush (NS) 0.9 % injection 3 mL  3 mL Intravenous PRN Gautam Juleen China, MD        REVIEW OF SYSTEMS:    10 Point review of Systems was done is negative except as noted above.  PHYSICAL EXAMINATION: ECOG PERFORMANCE STATUS: 1 - Symptomatic but completely ambulatory  . Vitals:   03/08/16 2123 03/09/16 0548  BP: 115/71 134/80  Pulse: (!) 113 (!) 102  Resp: 16 16  Temp: 98.3 F (36.8 C) 97.5 F (36.4 C)   Filed Weights   03/08/16 0845  Weight: 175 lb (79.4 kg)   .Body mass index is 23.73 kg/m.  GENERAL:alert, in no acute distress and comfortable SKIN: skin color, texture, turgor are normal, no rashes or significant lesions EYES: normal, conjunctiva are pink and non-injected, sclera clear OROPHARYNX:no exudate, no erythema and lips, buccal mucosa, and tongue normal  NECK: supple, no JVD, thyroid normal size, non-tender, without nodularity LYMPH:  no palpable lymphadenopathy in the cervical, axillary or inguinal LUNGS: clear to auscultation with normal respiratory effort HEART: regular rate & rhythm,  no murmurs and no lower extremity edema ABDOMEN: abdomen soft, non-tender, normoactive bowel sounds  Musculoskeletal: no cyanosis of digits and no clubbing  PSYCH: alert & oriented x 3 with fluent speech NEURO: no focal motor/sensory deficits  LABORATORY DATA:  I have reviewed the data as listed  . CBC Latest Ref Rng & Units 03/08/2016 03/05/2016  WBC 4.0 - 10.5 K/uL 14.2(H) 15.5(H)  Hemoglobin 13.0 - 17.0 g/dL 9.7(L) 10.7(L)  Hematocrit 39.0 - 52.0 % 30.4(L) 33.4(L)  Platelets 150 - 400 K/uL 382 253    . CMP Latest Ref Rng & Units 03/08/2016 03/05/2016  Glucose 65 - 99 mg/dL  108(H) 114  BUN 6 - 20 mg/dL 11 12.1  Creatinine 0.61 - 1.24 mg/dL 0.65 0.8  Sodium 135 - 145 mmol/L 134(L) 139  Potassium 3.5 - 5.1 mmol/L 3.9 4.2  Chloride 101 - 111 mmol/L 103 -  CO2 22 - 32 mmol/L 27 26  Calcium 8.9 - 10.3 mg/dL 9.0 9.9  Total Protein 6.5 - 8.1 g/dL 6.6 6.9  Total Bilirubin 0.3 - 1.2 mg/dL 0.6 0.41  Alkaline Phos 38 - 126 U/L 77 116  AST 15 -  41 U/L 18 15  ALT 17 - 63 U/L 16(L) 18     RADIOGRAPHIC STUDIES: I have personally reviewed the radiological images as listed and agreed with the findings in the report. Nm Pet Image Restag (ps) Skull Base To Thigh  Result Date: 03/02/2016 CLINICAL DATA:  Subsequent treatment strategy for Burkitt's lymphoma status post 3 cycles chemotherapy. EXAM: NUCLEAR MEDICINE PET SKULL BASE TO THIGH TECHNIQUE: 9.2 mCi F-18 FDG was injected intravenously. Full-ring PET imaging was performed from the skull base to thigh after the radiotracer. CT data was obtained and used for attenuation correction and anatomic localization. FASTING BLOOD GLUCOSE:  Value: 129 mg/dl COMPARISON:  12/25/2015 PET-CT. FINDINGS: NECK No hypermetabolic lymph nodes in the neck. CHEST No hypermetabolic or enlarged axillary, mediastinal or hilar nodes. Previously described hypermetabolic high left mediastinal node has resolved. Previously visualized left hilar hypermetabolic node has resolved. Mild centrilobular emphysema. Solid 5 mm right middle lobe pulmonary nodule (series 8/image 35) is stable and below PET resolution. No acute consolidative airspace disease or additional significant pulmonary nodules. Previously described hypermetabolic 1.3 cm central right upper lobe pulmonary nodule has resolved. Right internal jugular MediPort terminates near the cavoatrial junction. Left anterior descending, left circumflex and right coronary atherosclerosis. Atherosclerotic nonaneurysmal thoracic aorta. No pleural effusions. ABDOMEN/PELVIS Hypermetabolic irregular 5.7 x 2.7 cm central  mesenteric nodal conglomerate (series 4/image 151) with max SUV 4.9, previously 10.6 x 7.1 cm with max SUV 41.5, significantly decreased in size and metabolism. No additional pathologically enlarged or hypermetabolic lymph nodes in the abdomen or pelvis. Previously described hypermetabolic left pelvic adenopathy has resolved. No abnormal hypermetabolic activity within the liver, pancreas, adrenal glands, or spleen. Moderate hiatal hernia. Stable diffuse bladder wall thickening. Bladder is nearly collapsed. Stable moderately enlarged prostate with nonspecific internal prostatic calcifications. Atherosclerotic nonaneurysmal abdominal aorta. SKELETON There is mild diffuse marrow hypermetabolism throughout the axial skeleton, consistent with reactive marrow state given ongoing chemotherapy. There is residual hypermetabolism (max SUV 6.3) at the site of the pathologic fracture associated with the mixed lytic and sclerotic left sacral lesion, which is increased in sclerosis on the CT images and decreased significantly in metabolism (previous max SUV 31.1). No residual skeletal hypermetabolism at the previously visualized sites in the medial left iliac bone and left superior acetabulum. No residual soft tissue component in the left posterior ninth rib lesion. Mild residual hypermetabolism at the site of a pathologic fracture in the left posterior ninth rib (max SUV 4.3), significantly decreased in metabolism (previous max SUV 40.6). No residual soft tissue component or residual hypermetabolism at the left anterior third rib bone lesion. No residual hypermetabolism in the right anterior seventh rib or right proximal femoral lesions. No new hypermetabolic skeletal lesions. IMPRESSION: 1. Significant interval metabolic response. 2. Previously described hypermetabolic high left mediastinal and left hilar nodal disease and right upper lobe pulmonary nodule have resolved. 3. Hypermetabolic conglomerate mediastinal adenopathy is  significantly decreased in size and metabolism. Previously described left pelvic hypermetabolic adenopathy has resolved. 4. Previously described hypermetabolic skeletal lesions in the right anterior seventh rib, left anterior third rib, right proximal femur and left iliac bone have resolved. Residual hypermetabolism at the sites of pathologic fractures in the left sacral and left posterior ninth rib lesions, significantly decreased in metabolism, with resolution of the soft tissue component in the left posterior ninth rib lesion. 5. No new or progressive hypermetabolic sites of disease. 6. Solitary right middle lobe 5 mm pulmonary nodule is stable and below PET resolution, probably benign. 7.  Mild diffuse marrow hypermetabolism throughout the axial skeleton, consistent with reactive marrow state given ongoing chemotherapy. 8. Additional findings include aortic atherosclerosis, three-vessel coronary atherosclerosis, mild centrilobular emphysema, stable diffuse bladder wall thickening with moderately enlarged prostate, and moderate hiatal hernia. Electronically Signed   By: Ilona Sorrel M.D.   On: 03/02/2016 12:26   Dg Fluoro Guided Loc Of Needle/cath Tip For Spinal Inject Rt  Result Date: 03/08/2016 CLINICAL DATA:  65 year old male presents for his third intrathecal chemotherapy injection. Burkitt lymphoma. Subsequent encounter. EXAM: FLUOROSCOPICALLY GUIDED LUMBAR PUNCTURE FOR INTRATHECAL CHEMOTHERAPY TECHNIQUE: Informed consent was obtained from the patient prior to the procedure, including potential complications of headache, allergy, and pain. A 'time out' was performed. With the patient prone, the lower back was prepped with Betadine. 1% Lidocaine was used for local anesthesia. Lumbar puncture was performed at the L2-L3 level using left sub laminar technique, and a 3.5 inch x 20 gauge needle with return of clear CSF. The chemotherapy mixture sent from pharmacy labeled with the patient's name and date of  birth was slowly injected into the subarachnoid space. The patient tolerated the procedure well without apparent complication. Appropriate postprocedural orders were placed in Epic and he was returned to the inpatient ward for further treatment. FLUOROSCOPY TIME:  0 minutes 12 seconds IMPRESSION: Intrathecal injection of chemotherapy at P9-J0 without complication. Electronically Signed   By: Genevie Ann M.D.   On: 03/08/2016 14:14   Dg Fluoro Guided Loc Of Needle/cath Tip For Spinal Inject Rt  Result Date: 02/16/2016 CLINICAL DATA:  Lumbar puncture with chemotherapy injection, diagnostic and therapeutic. Burkitt's lymphoma. EXAM: FLUOROSCOPICALLY GUIDED LUMBAR PUNCTURE FOR INTRATHECAL CHEMOTHERAPY TECHNIQUE: Informed consent was obtained from the patient prior to the procedure, including potential complications of headache, allergy, and pain. A 'time out' was performed. With the patient prone, the lower back was prepped with Betadine. 1% Lidocaine was used for local anesthesia. Lumbar puncture was performed at the L2-3 using a 20 gauge needle with return of clear CSF. Opening pressure 14 cm of water. 10 cc of clear CSF was collected. 12 mg methotrexate with 50 mg hydrocortisone in a 10 mL solutionwas injected into the subarachnoid space. The patient tolerated the procedure well without apparent complication. FLUOROSCOPY TIME:  20 seconds IMPRESSION: Intrathecal injection of chemotherapy without complication Electronically Signed   By: Rolm Baptise M.D.   On: 02/16/2016 12:54   Dg Fluoro Guide Lumbar Puncture  Result Date: 02/16/2016 CLINICAL DATA:  Lumbar puncture with chemotherapy injection, diagnostic and therapeutic. Burkitt's lymphoma. EXAM: FLUOROSCOPICALLY GUIDED LUMBAR PUNCTURE FOR INTRATHECAL CHEMOTHERAPY TECHNIQUE: Informed consent was obtained from the patient prior to the procedure, including potential complications of headache, allergy, and pain. A 'time out' was performed. With the patient prone,  the lower back was prepped with Betadine. 1% Lidocaine was used for local anesthesia. Lumbar puncture was performed at the L2-3 using a 20 gauge needle with return of clear CSF. Opening pressure 14 cm of water. 10 cc of clear CSF was collected. 12 mg methotrexate with 50 mg hydrocortisone in a 10 mL solutionwas injected into the subarachnoid space. The patient tolerated the procedure well without apparent complication. FLUOROSCOPY TIME:  20 seconds IMPRESSION: Intrathecal injection of chemotherapy without complication Electronically Signed   By: Rolm Baptise M.D.   On: 02/16/2016 12:54    ASSESSMENT & PLAN:   1) Stage IVB Burkitt's lymphoma with t(8;14) translocation diagnosed 12/23/2015. This appears to be involving the sacrum causing sacral and root involvement with possible neurogenic bladder.  PET CT scan as noted above shows extensive involvement with lymphoma. CSF negative for involvement. Bone marrow biopsy negative for involvement by lymphoma.   CURRENT TREATMENT The patient has completed 3 cycles of EPOCH -R  Planned treatment 6 cycles of EPOCH-R  IT methotrexate x 4 doses starting with cycle 3 for CNS prophylaxis All of the patients questions were answered with apparent satisfaction. The patient knows to call the clinic with any problems, questions or concerns. PLAN -he is labs are stable. His diarrhea has resolved. -His appropriate to proceed with his fourth cycle of EPOCH-R starting today. -He will receive his second dose of intrathecal methotrexate today by IR. Burnis Medin continue the same supportive medications. -Rituxan on day 5 -Outpatient Neulasta on day 6 . -We'll continue to monitor daily CBC and CMP.  2) recent diarrhea with GI panel positive for Vibrio cholerae. ID consulted - suggest that the Vibrio cholerae was likely false positive. Given that the patient has Burkitt's lymphoma and is on immunosuppressive chemotherapy we did empirically treat him with one dose of  doxycycline. -Patient's diarrhea has currently nearly completely resolved . Plan - we will continue to monitor  -Patient was placed in enteric precautions pending ID input . Appreciate input from Dr. Linus Salmons.  3) DVT prophylaxis  - TEDS and SCD . We'll start Lovenox 24 hours after lumbar puncture .  4) HTN -continue outpatient medications   5) PAD - has been on outpatient aspirin and Plavix . Was held for his planned lumbar puncture . We'll restart aspirin tomorrow if no issues with LP and Plavix thereafter .   I spent 45 minutes counseling the patient face to face. The total time spent in the appointment was 60 minutes and more than 50% was on counseling and direct patient cares and coordination of care with infectious disease nursing staff and pharmacy.    Sullivan Lone MD [place] AAHIVMS [facility] Hematology/Oncology Physician [facility]  (Office):       [phone] (Work cell):  [phone] (Fax):           [phone]

## 2016-03-09 NOTE — Progress Notes (Signed)
[name]   HEMATOLOGY/ONCOLOGY INPATIENT PROGRESS NOTE  Date of Service: 03/09/2016  Inpatient Attending: .Brunetta Genera, MD   SUBJECTIVE  Erik Vaughn notes no acute issues overnight. His diarrhea has completely resolved and he has formed stools now. Eating well. No fevers or chills. No other acute new concerns. No headaches. No back pain. Tolerated his lumbar puncture well yesterday.   OBJECTIVE:  NAD  PHYSICAL EXAMINATION: . Vitals:   03/08/16 0845 03/08/16 1514 03/08/16 2123 03/09/16 0548  BP: (!) 149/80 138/72 115/71 134/80  Pulse: 100 (!) 104 (!) 113 (!) 102  Resp: _0 Temp: 98 F (36.7 C) 98.1 F (36.7 C) 98.3 F (36.8 C) 97.5 F (36.4 C)  TempSrc: Oral Oral Oral Oral  SpO2: 99% 98% 99% 98%  Weight: 175 lb (79.4 kg)     Height: 6' (1.829 m)      Filed Weights   03/08/16 0845  Weight: 175 lb (79.4 kg)   .Body mass index is 23.73 kg/m.  GENERAL:alert, in no acute distress and comfortable SKIN: skin color, texture, turgor are normal, no rashes or significant lesions EYES: normal, conjunctiva are pink and non-injected, sclera clear OROPHARYNX:no exudate, no erythema and lips, buccal mucosa, and tongue normal  NECK: supple, no JVD, thyroid normal size, non-tender, without nodularity LYMPH:  no palpable lymphadenopathy in the cervical, axillary or inguinal LUNGS: clear to auscultation with normal respiratory effort HEART: regular rate & rhythm,  no murmurs and no lower extremity edema ABDOMEN: abdomen soft, non-tender, normoactive bowel sounds  Musculoskeletal: no cyanosis of digits and no clubbing  PSYCH: alert & oriented x 3 with fluent speech NEURO: no focal motor/sensory deficits  MEDICAL HISTORY:  Past Medical History:  Diagnosis Date  . Cancer ([place])    [place] Lymphoma  . Hypertension   . PAD (peripheral artery disease) ([facility]) left leg    SURGICAL HISTORY: Past Surgical History:  Procedure Laterality Date  . IR GENERIC HISTORICAL   12/31/2015   IR FLUORO GUIDE CV LINE RIGHT 12/31/2015 Arne Cleveland, MD [facility]  . IR GENERIC HISTORICAL  12/31/2015   IR US GUIDE VASC ACCESS RIGHT 12/31/2015 Arne Cleveland, MD [facility]  . left salivary Left    removed    SOCIAL HISTORY: Social History   Social History  . Marital status: Married    Spouse name: N/A  . Number of children: N/A  . Years of education: N/A   Occupational History  . meter specialist    Social History Main Topics  . Smoking status: Former Smoker    Packs/day: 2.00    Years: 16.00    Quit date: 12/22/2005  . Smokeless tobacco: Never Used  . Alcohol use Yes     Comment: occasionally  . Drug use: No  . Sexual activity: No   Other Topics Concern  . Not on file   Social History Narrative  . No narrative on file    FAMILY HISTORY: Family History  Problem Relation Age of Onset  . Alzheimer's disease Father 51    ALLERGIES:  has No Known Allergies.  MEDICATIONS:  Scheduled Meds: . amLODipine  10 mg Oral Daily  . amLODipine  10 mg Oral q1800  . DOXOrubicin/vinCRIStine/etoposide CHEMO IV infusion for Inpatient CI   Intravenous Once  . DOXOrubicin/vinCRIStine/etoposide CHEMO IV infusion for Inpatient CI   Intravenous Once  . lisinopril  20 mg Oral q1800  . mirtazapine  15 mg Oral QHS  . ondansetron (ZOFRAN) with dexamethasone (DECADRON)  IV   Intravenous Once  . predniSONE  60 mg/m2/day (Treatment Plan Recorded) Oral QAC breakfast   Continuous Infusions: . sodium chloride     PRN Meds:.acetaminophen, alteplase, heparin lock flush, heparin lock flush, LORazepam, oxyCODONE, sodium chloride flush, sodium chloride flush  REVIEW OF SYSTEMS:    10 Point review of Systems was done is negative except as noted above.   LABORATORY DATA:  I have reviewed the data as listed  . CBC Latest Ref Rng & Units 03/09/2016 03/08/2016 03/05/2016  WBC 4.0 - 10.5 K/uL 21.2(H) 14.2(H) 15.5(H)  Hemoglobin 13.0 - 17.0 g/dL 9.3(L) 9.7(L) 10.7(L)    Hematocrit 39.0 - 52.0 % 28.9(L) 30.4(L) 33.4(L)  Platelets 150 - 400 K/uL 433(H) 382 253    . CMP Latest Ref Rng & Units 03/09/2016 03/08/2016 03/05/2016  Glucose 65 - 99 mg/dL 165(H) 108(H) 114  BUN 6 - 20 mg/dL 14 11 12.1  Creatinine 0.61 - 1.24 mg/dL 0.70 0.65 0.8  Sodium 135 - 145 mmol/L 137 134(L) 139  Potassium 3.5 - 5.1 mmol/L 4.0 3.9 4.2  Chloride 101 - 111 mmol/L 106 103 -  CO2 22 - 32 mmol/L _0 Calcium 8.9 - 10.3 mg/dL 9.2 9.0 9.9  Total Protein 6.5 - 8.1 g/dL 6.6 6.6 6.9  Total Bilirubin 0.3 - 1.2 mg/dL 0.4 0.6 0.41  Alkaline Phos 38 - 126 U/L 78 77 116  AST 15 - 41 U/L _1 ALT 17 - 63 U/L 16(L) 16(L) 18     RADIOGRAPHIC STUDIES: I have personally reviewed the radiological images as listed and agreed with the findings in the report. Nm Pet Image Restag (ps) Skull Base To Thigh  Result Date: 03/02/2016 CLINICAL DATA:  Subsequent treatment strategy for Burkitt's lymphoma status post 3 cycles chemotherapy. EXAM: NUCLEAR MEDICINE PET SKULL BASE TO THIGH TECHNIQUE: 9.2 mCi F-18 FDG was injected intravenously. Full-ring PET imaging was performed from the skull base to thigh after the radiotracer. CT data was obtained and used for attenuation correction and anatomic localization. FASTING BLOOD GLUCOSE:  Value: 129 mg/dl COMPARISON:  12/25/2015 PET-CT. FINDINGS: NECK No hypermetabolic lymph nodes in the neck. CHEST No hypermetabolic or enlarged axillary, mediastinal or hilar nodes. Previously described hypermetabolic high left mediastinal node has resolved. Previously visualized left hilar hypermetabolic node has resolved. Mild centrilobular emphysema. Solid 5 mm right middle lobe pulmonary nodule (series 8/image 35) is stable and below PET resolution. No acute consolidative airspace disease or additional significant pulmonary nodules. Previously described hypermetabolic 1.3 cm central right upper lobe pulmonary nodule has resolved. Right internal jugular MediPort  terminates near the cavoatrial junction. Left anterior descending, left circumflex and right coronary atherosclerosis. Atherosclerotic nonaneurysmal thoracic aorta. No pleural effusions. ABDOMEN/PELVIS Hypermetabolic irregular 5.7 x 2.7 cm central mesenteric nodal conglomerate (series 4/image 151) with max SUV 4.9, previously 10.6 x 7.1 cm with max SUV 41.5, significantly decreased in size and metabolism. No additional pathologically enlarged or hypermetabolic lymph nodes in the abdomen or pelvis. Previously described hypermetabolic left pelvic adenopathy has resolved. No abnormal hypermetabolic activity within the liver, pancreas, adrenal glands, or spleen. Moderate hiatal hernia. Stable diffuse bladder wall thickening. Bladder is nearly collapsed. Stable moderately enlarged prostate with nonspecific internal prostatic calcifications. Atherosclerotic nonaneurysmal abdominal aorta. SKELETON There is mild diffuse marrow hypermetabolism throughout the axial skeleton, consistent with reactive marrow state given ongoing chemotherapy. There is residual hypermetabolism (max SUV 6.3) at the site of the pathologic fracture associated with the mixed lytic and sclerotic left sacral  lesion, which is increased in sclerosis on the CT images and decreased significantly in metabolism (previous max SUV 31.1). No residual skeletal hypermetabolism at the previously visualized sites in the medial left iliac bone and left superior acetabulum. No residual soft tissue component in the left posterior ninth rib lesion. Mild residual hypermetabolism at the site of a pathologic fracture in the left posterior ninth rib (max SUV 4.3), significantly decreased in metabolism (previous max SUV 40.6). No residual soft tissue component or residual hypermetabolism at the left anterior third rib bone lesion. No residual hypermetabolism in the right anterior seventh rib or right proximal femoral lesions. No new hypermetabolic skeletal lesions.  IMPRESSION: 1. Significant interval metabolic response. 2. Previously described hypermetabolic high left mediastinal and left hilar nodal disease and right upper lobe pulmonary nodule have resolved. 3. Hypermetabolic conglomerate mediastinal adenopathy is significantly decreased in size and metabolism. Previously described left pelvic hypermetabolic adenopathy has resolved. 4. Previously described hypermetabolic skeletal lesions in the right anterior seventh rib, left anterior third rib, right proximal femur and left iliac bone have resolved. Residual hypermetabolism at the sites of pathologic fractures in the left sacral and left posterior ninth rib lesions, significantly decreased in metabolism, with resolution of the soft tissue component in the left posterior ninth rib lesion. 5. No new or progressive hypermetabolic sites of disease. 6. Solitary right middle lobe 5 mm pulmonary nodule is stable and below PET resolution, probably benign. 7. Mild diffuse marrow hypermetabolism throughout the axial skeleton, consistent with reactive marrow state given ongoing chemotherapy. 8. Additional findings include aortic atherosclerosis, three-vessel coronary atherosclerosis, mild centrilobular emphysema, stable diffuse bladder wall thickening with moderately enlarged prostate, and moderate hiatal hernia. Electronically Signed   By: Ilona Sorrel M.D.   On: 03/02/2016 12:26   Dg Fluoro Guided Loc Of Needle/cath Tip For Spinal Inject Rt  Result Date: 03/08/2016 CLINICAL DATA:  65 year old male presents for his third intrathecal chemotherapy injection. Burkitt lymphoma. Subsequent encounter. EXAM: FLUOROSCOPICALLY GUIDED LUMBAR PUNCTURE FOR INTRATHECAL CHEMOTHERAPY TECHNIQUE: Informed consent was obtained from the patient prior to the procedure, including potential complications of headache, allergy, and pain. A 'time out' was performed. With the patient prone, the lower back was prepped with Betadine. 1% Lidocaine was used  for local anesthesia. Lumbar puncture was performed at the L2-L3 level using left sub laminar technique, and a 3.5 inch x 20 gauge needle with return of clear CSF. The chemotherapy mixture sent from pharmacy labeled with the patient's name and date of birth was slowly injected into the subarachnoid space. The patient tolerated the procedure well without apparent complication. Appropriate postprocedural orders were placed in Epic and he was returned to the inpatient ward for further treatment. FLUOROSCOPY TIME:  0 minutes 12 seconds IMPRESSION: Intrathecal injection of chemotherapy at J5-K0 without complication. Electronically Signed   By: Genevie Ann M.D.   On: 03/08/2016 14:14   Dg Fluoro Guided Loc Of Needle/cath Tip For Spinal Inject Rt  Result Date: 02/16/2016 CLINICAL DATA:  Lumbar puncture with chemotherapy injection, diagnostic and therapeutic. Burkitt's lymphoma. EXAM: FLUOROSCOPICALLY GUIDED LUMBAR PUNCTURE FOR INTRATHECAL CHEMOTHERAPY TECHNIQUE: Informed consent was obtained from the patient prior to the procedure, including potential complications of headache, allergy, and pain. A 'time out' was performed. With the patient prone, the lower back was prepped with Betadine. 1% Lidocaine was used for local anesthesia. Lumbar puncture was performed at the L2-3 using a 20 gauge needle with return of clear CSF. Opening pressure 14 cm of water. 10 cc of clear  CSF was collected. 12 mg methotrexate with 50 mg hydrocortisone in a 10 mL solutionwas injected into the subarachnoid space. The patient tolerated the procedure well without apparent complication. FLUOROSCOPY TIME:  20 seconds IMPRESSION: Intrathecal injection of chemotherapy without complication Electronically Signed   By: Rolm Baptise M.D.   On: 02/16/2016 12:54   Dg Fluoro Guide Lumbar Puncture  Result Date: 02/16/2016 CLINICAL DATA:  Lumbar puncture with chemotherapy injection, diagnostic and therapeutic. Burkitt's lymphoma. EXAM: FLUOROSCOPICALLY  GUIDED LUMBAR PUNCTURE FOR INTRATHECAL CHEMOTHERAPY TECHNIQUE: Informed consent was obtained from the patient prior to the procedure, including potential complications of headache, allergy, and pain. A 'time out' was performed. With the patient prone, the lower back was prepped with Betadine. 1% Lidocaine was used for local anesthesia. Lumbar puncture was performed at the L2-3 using a 20 gauge needle with return of clear CSF. Opening pressure 14 cm of water. 10 cc of clear CSF was collected. 12 mg methotrexate with 50 mg hydrocortisone in a 10 mL solutionwas injected into the subarachnoid space. The patient tolerated the procedure well without apparent complication. FLUOROSCOPY TIME:  20 seconds IMPRESSION: Intrathecal injection of chemotherapy without complication Electronically Signed   By: Rolm Baptise M.D.   On: 02/16/2016 12:54    ASSESSMENT & PLAN:   1) Stage IVB Burkitt's lymphoma with t(8;14) translocation diagnosed 12/23/2015. This appears to be involving the sacrum causing sacral and root involvement with possible neurogenic bladder. PET CT scan as noted above shows extensive involvement with lymphoma. CSF negative for involvement. Bone marrow biopsy negative for involvement by lymphoma.   CURRENT TREATMENT The patient has completed 3cycles of EPOCH -R  Planned treatment 6 cycles of EPOCH-R  IT methotrexate x 4 doses starting with cycle 3 for CNS prophylaxis All of the patients questions were answered with apparent satisfaction. The patient knows to call the clinic with any problems, questions or concerns. PLAN -he is labs are stable. C4D2 today. No issues with nausea or vomiting. Tolerated the lumbar puncture yesterday without any headaches or back pain. -continue with EPOCH-R as per orders. -He will receive his second dose of intrathecal methotrexate today by IR. Burnis Medin continue the same supportive medications. -Rituxan on day 5 -Outpatient Neulasta on day 6 . -We'll continue  to monitor daily CBC and CMP.  2) recent diarrhea with GI panel positive for Vibrio cholerae. ID consulted - suggest that the Vibrio cholerae was likely false positive. Given that the patient has Burkitt's lymphoma and is on immunosuppressive chemotherapy we did empirically treat him with one dose of doxycycline. Diarrhea resolved. Plan - we will continue to monitor  -no laxatives at this time.  3) DVT prophylaxis  - TEDS and SCD . Will start lovenox prophylaxis from this evening  4) HTN -continue outpatient medications   5) PAD - has been on outpatient aspirin and Plavix . Was held for his planned lumbar puncture . -Will restart aspirin this evening. -Plavix and the next day or 2 if no issues.   I spent 30 minutes counseling the patient face to face. The total time spent in the appointment was 35 minutes and more than 50% was on counseling and direct patient cares.    Erik Lone MD [place] AAHIVMS [facility] Hematology/Oncology Physician [facility]  (Office):       [phone] (Work cell):  [phone] (Fax):           [phone]  03/09/2016 11:38 AM

## 2016-03-10 DIAGNOSIS — A09 Infectious gastroenteritis and colitis, unspecified: Secondary | ICD-10-CM

## 2016-03-10 DIAGNOSIS — Z5111 Encounter for antineoplastic chemotherapy: Principal | ICD-10-CM

## 2016-03-10 LAB — COMPREHENSIVE METABOLIC PANEL
ALBUMIN: 3.3 g/dL — AB (ref 3.5–5.0)
ALK PHOS: 69 U/L (ref 38–126)
ALT: 16 U/L — AB (ref 17–63)
ANION GAP: 6 (ref 5–15)
AST: 19 U/L (ref 15–41)
BUN: 16 mg/dL (ref 6–20)
CALCIUM: 9 mg/dL (ref 8.9–10.3)
CHLORIDE: 107 mmol/L (ref 101–111)
CO2: 25 mmol/L (ref 22–32)
CREATININE: 0.69 mg/dL (ref 0.61–1.24)
GFR calc Af Amer: >60 mL/min (ref >60)
GFR calc non Af Amer: >60 mL/min (ref >60)
GLUCOSE: 175 mg/dL — AB (ref 65–99)
Potassium: 4.5 mmol/L (ref 3.5–5.1)
SODIUM: 138 mmol/L (ref 135–145)
Total Bilirubin: 0.2 mg/dL — ABNORMAL LOW (ref 0.3–1.2)
Total Protein: 6 g/dL — ABNORMAL LOW (ref 6.5–8.1)

## 2016-03-10 LAB — CBC
HCT: 26.7 % — ABNORMAL LOW (ref 39.0–52.0)
HEMOGLOBIN: 8.7 g/dL — AB (ref 13.0–17.0)
MCH: 25.9 pg — ABNORMAL LOW (ref 26.0–34.0)
MCHC: 32.6 g/dL (ref 30.0–36.0)
MCV: 79.5 fL (ref 78.0–100.0)
Platelets: 399 10*3/uL (ref 150–400)
RBC: 3.36 MIL/uL — AB (ref 4.22–5.81)
RDW: 18.8 % — ABNORMAL HIGH (ref 11.5–15.5)
WBC: 14.5 10*3/uL — AB (ref 4.0–10.5)

## 2016-03-10 MED ORDER — VINCRISTINE SULFATE CHEMO INJECTION 1 MG/ML
Freq: Once | INTRAVENOUS | Status: AC
  Administered 2016-03-10: 17:00:00 via INTRAVENOUS
  Filled 2016-03-10: qty 10

## 2016-03-10 MED ORDER — ONDANSETRON HCL 40 MG/20ML IJ SOLN
Freq: Once | INTRAMUSCULAR | Status: AC
  Administered 2016-03-10: 18 mg via INTRAVENOUS
  Filled 2016-03-10: qty 4

## 2016-03-10 NOTE — Progress Notes (Signed)
Adriamycin, Etoposide and Oncovin doses and dilutions verified with 2nd chemo competent RN Laural Benes.

## 2016-03-10 NOTE — Progress Notes (Signed)
No new issues noted, no significant diarrhea at this time, isolation precautions appropriately removed and I do not feel he needs isolation in regards to diarrhea.   Scharlene Gloss, MD

## 2016-03-11 LAB — COMPREHENSIVE METABOLIC PANEL
ALBUMIN: 3.4 g/dL — AB (ref 3.5–5.0)
ALT: 19 U/L (ref 17–63)
AST: 20 U/L (ref 15–41)
Alkaline Phosphatase: 74 U/L (ref 38–126)
Anion gap: 8 (ref 5–15)
BUN: 23 mg/dL — AB (ref 6–20)
CHLORIDE: 103 mmol/L (ref 101–111)
CO2: 27 mmol/L (ref 22–32)
CREATININE: 0.64 mg/dL (ref 0.61–1.24)
Calcium: 9.2 mg/dL (ref 8.9–10.3)
GFR calc Af Amer: >60 mL/min (ref >60)
GFR calc non Af Amer: >60 mL/min (ref >60)
GLUCOSE: 163 mg/dL — AB (ref 65–99)
Potassium: 4.1 mmol/L (ref 3.5–5.1)
SODIUM: 138 mmol/L (ref 135–145)
Total Bilirubin: 0.5 mg/dL (ref 0.3–1.2)
Total Protein: 6.2 g/dL — ABNORMAL LOW (ref 6.5–8.1)

## 2016-03-11 LAB — CBC
HCT: 29.8 % — ABNORMAL LOW (ref 39.0–52.0)
Hemoglobin: 9.5 g/dL — ABNORMAL LOW (ref 13.0–17.0)
MCH: 26.1 pg (ref 26.0–34.0)
MCHC: 31.9 g/dL (ref 30.0–36.0)
MCV: 81.9 fL (ref 78.0–100.0)
PLATELETS: 445 10*3/uL — AB (ref 150–400)
RBC: 3.64 MIL/uL — AB (ref 4.22–5.81)
RDW: 18.8 % — ABNORMAL HIGH (ref 11.5–15.5)
WBC: 14 10*3/uL — ABNORMAL HIGH (ref 4.0–10.5)

## 2016-03-11 MED ORDER — SODIUM CHLORIDE 0.9 % IV SOLN
Freq: Once | INTRAVENOUS | Status: AC
  Administered 2016-03-11: 18 mg via INTRAVENOUS
  Filled 2016-03-11: qty 4

## 2016-03-11 MED ORDER — SODIUM CHLORIDE 0.9% FLUSH: 10.0000 mL | INTRAVENOUS | Status: DC | PRN

## 2016-03-11 MED ORDER — CLOPIDOGREL BISULFATE 75 MG PO TABS
75.0000 mg | ORAL_TABLET | Freq: Every day | ORAL | Status: DC
  Administered 2016-03-11 – 2016-03-12 (×2): 75 mg via ORAL
  Filled 2016-03-11 (×2): qty 1

## 2016-03-11 MED ORDER — VINCRISTINE SULFATE CHEMO INJECTION 1 MG/ML
Freq: Once | INTRAVENOUS | Status: AC
  Administered 2016-03-11: 14:00:00 via INTRAVENOUS
  Filled 2016-03-11: qty 10

## 2016-03-11 NOTE — Progress Notes (Signed)
HEMATOLOGY/ONCOLOGY INPATIENT PROGRESS NOTE  Date of Service: 03/10/2016 Inpatient Attending: .Brunetta Genera, MD   SUBJECTIVE  [name] was seen late evening on 03/10/2016. Sitting up in the chair. he feels good and has been walking around in a lot. No further diarrhea. No abdominal pain. he is off enteric precautions as per infectious disease recommendations. No other acute new concerns.   OBJECTIVE:  NAD  PHYSICAL EXAMINATION: . Vitals:   03/10/16 0613 03/10/16 1507 03/10/16 2126 03/11/16 0605  BP: 120/68 123/72 (!) 141/77 139/73  Pulse: (!) 103 90 98 80  Resp: _0 Temp: 97.8 F (36.6 C) 98.2 F (36.8 C) 97.7 F (36.5 C) 97.9 F (36.6 C)  TempSrc: Oral Oral Oral Oral  SpO2: 100% 99% 98% 99%  Weight:      Height:       Filed Weights   03/08/16 0845  Weight: 175 lb (79.4 kg)   .Body mass index is 23.73 kg/m.  GENERAL:alert, in no acute distress and comfortable SKIN: skin color, texture, turgor are normal, no rashes or significant lesions EYES: normal, conjunctiva are pink and non-injected, sclera clear OROPHARYNX:no exudate, no erythema and lips, buccal mucosa, and tongue normal  NECK: supple, no JVD, thyroid normal size, non-tender, without nodularity LYMPH:  no palpable lymphadenopathy in the cervical, axillary or inguinal LUNGS: clear to auscultation with normal respiratory effort HEART: regular rate & rhythm,  no murmurs and no lower extremity edema ABDOMEN: abdomen soft, non-tender, normoactive bowel sounds  Musculoskeletal: no cyanosis of digits and no clubbing  PSYCH: alert & oriented x 3 with fluent speech NEURO: no focal motor/sensory deficits  MEDICAL HISTORY:  Past Medical History:  Diagnosis Date  . Cancer ([facility])    Burkett Lymphoma  . Hypertension   . PAD (peripheral artery disease) ([facility]) left leg    SURGICAL HISTORY: Past Surgical History:  Procedure Laterality Date  . IR GENERIC HISTORICAL  12/31/2015   IR FLUORO  GUIDE CV LINE RIGHT 12/31/2015 Arne Cleveland, MD [facility]  . IR GENERIC HISTORICAL  12/31/2015   IR US GUIDE VASC ACCESS RIGHT 12/31/2015 Arne Cleveland, MD [facility]  . left salivary Left    removed    SOCIAL HISTORY: Social History   Social History  . Marital status: Married    Spouse name: N/A  . Number of children: N/A  . Years of education: N/A   Occupational History  . meter specialist    Social History Main Topics  . Smoking status: Former Smoker    Packs/day: 2.00    Years: 16.00    Quit date: 12/22/2005  . Smokeless tobacco: Never Used  . Alcohol use Yes     Comment: occasionally  . Drug use: No  . Sexual activity: No   Other Topics Concern  . Not on file   Social History Narrative  . No narrative on file    FAMILY HISTORY: Family History  Problem Relation Age of Onset  . Alzheimer's disease Father 33    ALLERGIES:  has No Known Allergies.  MEDICATIONS:  Scheduled Meds: . amLODipine  10 mg Oral Daily  . amLODipine  10 mg Oral q1800  . aspirin EC  81 mg Oral QPM  . DOXOrubicin/vinCRIStine/etoposide CHEMO IV infusion for Inpatient CI   Intravenous Once  . DOXOrubicin/vinCRIStine/etoposide CHEMO IV infusion for Inpatient CI   Intravenous Once  . enoxaparin (LOVENOX) injection  30 mg Subcutaneous Q24H  . lisinopril  20 mg Oral q1800  .  mirtazapine  15 mg Oral QHS  . ondansetron (ZOFRAN) with dexamethasone (DECADRON) IV   Intravenous Once  . predniSONE  60 mg/m2/day (Treatment Plan Recorded) Oral QAC breakfast   Continuous Infusions: . sodium chloride     PRN Meds:.acetaminophen, alteplase, heparin lock flush, heparin lock flush, LORazepam, oxyCODONE, sodium chloride flush, sodium chloride flush, sodium chloride flush  REVIEW OF SYSTEMS:    10 Point review of Systems was done is negative except as noted above.   LABORATORY DATA:  I have reviewed the data as listed  . CBC Latest Ref Rng & Units 03/10/2016 03/09/2016  WBC 4.0 - 10.5 K/uL  14.5(H) 21.2(H)  Hemoglobin 13.0 - 17.0 g/dL 8.7(L) 9.3(L)  Hematocrit 39.0 - 52.0 % 26.7(L) 28.9(L)  Platelets 150 - 400 K/uL 399 433(H)    . CMP Latest Ref Rng & Units 03/10/2016 03/09/2016  Glucose 65 - 99 mg/dL 175(H) 165(H)  BUN 6 - 20 mg/dL 16 14  Creatinine 0.61 - 1.24 mg/dL 0.69 0.70  Sodium 135 - 145 mmol/L 138 137  Potassium 3.5 - 5.1 mmol/L 4.5 4.0  Chloride 101 - 111 mmol/L 107 106  CO2 22 - 32 mmol/L 25 23  Calcium 8.9 - 10.3 mg/dL 9.0 9.2  Total Protein 6.5 - 8.1 g/dL 6.0(L) 6.6  Total Bilirubin 0.3 - 1.2 mg/dL 0.2(L) 0.4  Alkaline Phos 38 - 126 U/L 69 78  AST 15 - 41 U/L 19 21  ALT 17 - 63 U/L 16(L) 16(L)     RADIOGRAPHIC STUDIES: I have personally reviewed the radiological images as listed and agreed with the findings in the report. Nm Pet Image Restag (ps) Skull Base To Thigh  Result Date: 03/02/2016 CLINICAL DATA:  Subsequent treatment strategy for Burkitt's lymphoma status post 3 cycles chemotherapy. EXAM: NUCLEAR MEDICINE PET SKULL BASE TO THIGH TECHNIQUE: 9.2 mCi F-18 FDG was injected intravenously. Full-ring PET imaging was performed from the skull base to thigh after the radiotracer. CT data was obtained and used for attenuation correction and anatomic localization. FASTING BLOOD GLUCOSE:  Value: 129 mg/dl COMPARISON:  12/25/2015 PET-CT. FINDINGS: NECK No hypermetabolic lymph nodes in the neck. CHEST No hypermetabolic or enlarged axillary, mediastinal or hilar nodes. Previously described hypermetabolic high left mediastinal node has resolved. Previously visualized left hilar hypermetabolic node has resolved. Mild centrilobular emphysema. Solid 5 mm right middle lobe pulmonary nodule (series 8/image 35) is stable and below PET resolution. No acute consolidative airspace disease or additional significant pulmonary nodules. Previously described hypermetabolic 1.3 cm central right upper lobe pulmonary nodule has resolved. Right internal jugular MediPort terminates near  the cavoatrial junction. Left anterior descending, left circumflex and right coronary atherosclerosis. Atherosclerotic nonaneurysmal thoracic aorta. No pleural effusions. ABDOMEN/PELVIS Hypermetabolic irregular 5.7 x 2.7 cm central mesenteric nodal conglomerate (series 4/image 151) with max SUV 4.9, previously 10.6 x 7.1 cm with max SUV 41.5, significantly decreased in size and metabolism. No additional pathologically enlarged or hypermetabolic lymph nodes in the abdomen or pelvis. Previously described hypermetabolic left pelvic adenopathy has resolved. No abnormal hypermetabolic activity within the liver, pancreas, adrenal glands, or spleen. Moderate hiatal hernia. Stable diffuse bladder wall thickening. Bladder is nearly collapsed. Stable moderately enlarged prostate with nonspecific internal prostatic calcifications. Atherosclerotic nonaneurysmal abdominal aorta. SKELETON There is mild diffuse marrow hypermetabolism throughout the axial skeleton, consistent with reactive marrow state given ongoing chemotherapy. There is residual hypermetabolism (max SUV 6.3) at the site of the pathologic fracture associated with the mixed lytic and sclerotic left sacral lesion, which is increased  in sclerosis on the CT images and decreased significantly in metabolism (previous max SUV 31.1). No residual skeletal hypermetabolism at the previously visualized sites in the medial left iliac bone and left superior acetabulum. No residual soft tissue component in the left posterior ninth rib lesion. Mild residual hypermetabolism at the site of a pathologic fracture in the left posterior ninth rib (max SUV 4.3), significantly decreased in metabolism (previous max SUV 40.6). No residual soft tissue component or residual hypermetabolism at the left anterior third rib bone lesion. No residual hypermetabolism in the right anterior seventh rib or right proximal femoral lesions. No new hypermetabolic skeletal lesions. IMPRESSION: 1.  Significant interval metabolic response. 2. Previously described hypermetabolic high left mediastinal and left hilar nodal disease and right upper lobe pulmonary nodule have resolved. 3. Hypermetabolic conglomerate mediastinal adenopathy is significantly decreased in size and metabolism. Previously described left pelvic hypermetabolic adenopathy has resolved. 4. Previously described hypermetabolic skeletal lesions in the right anterior seventh rib, left anterior third rib, right proximal femur and left iliac bone have resolved. Residual hypermetabolism at the sites of pathologic fractures in the left sacral and left posterior ninth rib lesions, significantly decreased in metabolism, with resolution of the soft tissue component in the left posterior ninth rib lesion. 5. No new or progressive hypermetabolic sites of disease. 6. Solitary right middle lobe 5 mm pulmonary nodule is stable and below PET resolution, probably benign. 7. Mild diffuse marrow hypermetabolism throughout the axial skeleton, consistent with reactive marrow state given ongoing chemotherapy. 8. Additional findings include aortic atherosclerosis, three-vessel coronary atherosclerosis, mild centrilobular emphysema, stable diffuse bladder wall thickening with moderately enlarged prostate, and moderate hiatal hernia. Electronically Signed   By: Ilona Sorrel M.D.   On: 03/02/2016 12:26   Dg Fluoro Guided Loc Of Needle/cath Tip For Spinal Inject Rt  Result Date: 03/08/2016 CLINICAL DATA:  65 year old male presents for his third intrathecal chemotherapy injection. Burkitt lymphoma. Subsequent encounter. EXAM: FLUOROSCOPICALLY GUIDED LUMBAR PUNCTURE FOR INTRATHECAL CHEMOTHERAPY TECHNIQUE: Informed consent was obtained from the patient prior to the procedure, including potential complications of headache, allergy, and pain. A 'time out' was performed. With the patient prone, the lower back was prepped with Betadine. 1% Lidocaine was used for local  anesthesia. Lumbar puncture was performed at the L2-L3 level using left sub laminar technique, and a 3.5 inch x 20 gauge needle with return of clear CSF. The chemotherapy mixture sent from pharmacy labeled with the patient's name and date of birth was slowly injected into the subarachnoid space. The patient tolerated the procedure well without apparent complication. Appropriate postprocedural orders were placed in Epic and he was returned to the inpatient ward for further treatment. FLUOROSCOPY TIME:  0 minutes 12 seconds IMPRESSION: Intrathecal injection of chemotherapy at D2-K0 without complication. Electronically Signed   By: Genevie Ann M.D.   On: 03/08/2016 14:14   Dg Fluoro Guided Loc Of Needle/cath Tip For Spinal Inject Rt  Result Date: 02/16/2016 CLINICAL DATA:  Lumbar puncture with chemotherapy injection, diagnostic and therapeutic. Burkitt's lymphoma. EXAM: FLUOROSCOPICALLY GUIDED LUMBAR PUNCTURE FOR INTRATHECAL CHEMOTHERAPY TECHNIQUE: Informed consent was obtained from the patient prior to the procedure, including potential complications of headache, allergy, and pain. A 'time out' was performed. With the patient prone, the lower back was prepped with Betadine. 1% Lidocaine was used for local anesthesia. Lumbar puncture was performed at the L2-3 using a 20 gauge needle with return of clear CSF. Opening pressure 14 cm of water. 10 cc of clear CSF was collected. 12  mg methotrexate with 50 mg hydrocortisone in a 10 mL solutionwas injected into the subarachnoid space. The patient tolerated the procedure well without apparent complication. FLUOROSCOPY TIME:  20 seconds IMPRESSION: Intrathecal injection of chemotherapy without complication Electronically Signed   By: Rolm Baptise M.D.   On: 02/16/2016 12:54   Dg Fluoro Guide Lumbar Puncture  Result Date: 02/16/2016 CLINICAL DATA:  Lumbar puncture with chemotherapy injection, diagnostic and therapeutic. Burkitt's lymphoma. EXAM: FLUOROSCOPICALLY GUIDED  LUMBAR PUNCTURE FOR INTRATHECAL CHEMOTHERAPY TECHNIQUE: Informed consent was obtained from the patient prior to the procedure, including potential complications of headache, allergy, and pain. A 'time out' was performed. With the patient prone, the lower back was prepped with Betadine. 1% Lidocaine was used for local anesthesia. Lumbar puncture was performed at the L2-3 using a 20 gauge needle with return of clear CSF. Opening pressure 14 cm of water. 10 cc of clear CSF was collected. 12 mg methotrexate with 50 mg hydrocortisone in a 10 mL solutionwas injected into the subarachnoid space. The patient tolerated the procedure well without apparent complication. FLUOROSCOPY TIME:  20 seconds IMPRESSION: Intrathecal injection of chemotherapy without complication Electronically Signed   By: Rolm Baptise M.D.   On: 02/16/2016 12:54    ASSESSMENT & PLAN:   1) Stage IVB Burkitt's lymphoma with t(8;14) translocation diagnosed 12/23/2015. This appears to be involving the sacrum causing sacral and root involvement with possible neurogenic bladder. PET CT scan as noted above shows extensive involvement with lymphoma. CSF negative for involvement. Bone marrow biopsy negative for involvement by lymphoma.   CURRENT TREATMENT The patient has completed 3cycles of EPOCH -R  Planned treatment 6 cycles of EPOCH-R  IT methotrexate x 4 doses starting with cycle 3 for CNS prophylaxis All of the patients questions were answered with apparent satisfaction. The patient knows to call the clinic with any problems, questions or concerns. PLAN -he is labs are stable. C4D3 today. No issues with nausea or vomiting. -continue with EPOCH-R as per orders. -He will receive his second dose of intrathecal methotrexate today by IR. Burnis Medin continue the same supportive medications. -Rituxan on day 5 -Outpatient Neulasta on day 6 . -We'll continue to monitor daily CBC and CMP.  2) recent diarrhea with GI panel positive for  Vibrio cholerae. ID consulted - suggest that the Vibrio cholerae was likely false positive. Given that the patient has Burkitt's lymphoma and is on immunosuppressive chemotherapy we did empirically treat him with one dose of doxycycline. Diarrhea resolved. Plan - we will continue to monitor  -no laxatives at this time. -Off enteric precautions as per ID  3) DVT prophylaxis  - TEDS and SCD . Back on lovenox 4) HTN -continue outpatient medications   5) PAD - has been on outpatient aspirin and Plavix . Was held for his planned lumbar puncture . --was restarted on ASA -will restart plavix today. -will need to hold both 1 week prior to next IT methotrexate with next cycle.  I spent 30 minutes counseling the patient face to face. The total time spent in the appointment was 35 minutes and more than 50% was on counseling and direct patient cares.    Sullivan Lone MD [place] [facility] Hematology/Oncology Physician [facility], [place]  (Office):       [phone] (Work cell):  [phone] (Fax):           [phone]

## 2016-03-12 ENCOUNTER — Other Ambulatory Visit: Payer: Self-pay | Admitting: *Deleted

## 2016-03-12 ENCOUNTER — Other Ambulatory Visit: Payer: Self-pay | Admitting: Hematology

## 2016-03-12 ENCOUNTER — Other Ambulatory Visit: Payer: Self-pay | Admitting: Medical Oncology

## 2016-03-12 DIAGNOSIS — C8378 Burkitt lymphoma, lymph nodes of multiple sites: Secondary | ICD-10-CM

## 2016-03-12 LAB — COMPREHENSIVE METABOLIC PANEL
ALT: 22 U/L (ref 17–63)
AST: 24 U/L (ref 15–41)
Albumin: 3.2 g/dL — ABNORMAL LOW (ref 3.5–5.0)
Alkaline Phosphatase: 66 U/L (ref 38–126)
Anion gap: 8 (ref 5–15)
BUN: 19 mg/dL (ref 6–20)
CHLORIDE: 105 mmol/L (ref 101–111)
CO2: 27 mmol/L (ref 22–32)
CREATININE: 0.58 mg/dL — AB (ref 0.61–1.24)
Calcium: 8.9 mg/dL (ref 8.9–10.3)
GFR calc Af Amer: >60 mL/min (ref >60)
GFR calc non Af Amer: >60 mL/min (ref >60)
Glucose, Bld: 157 mg/dL — ABNORMAL HIGH (ref 65–99)
Potassium: 3.8 mmol/L (ref 3.5–5.1)
SODIUM: 140 mmol/L (ref 135–145)
Total Bilirubin: 0.6 mg/dL (ref 0.3–1.2)
Total Protein: 5.7 g/dL — ABNORMAL LOW (ref 6.5–8.1)

## 2016-03-12 LAB — CBC
HCT: 27.2 % — ABNORMAL LOW (ref 39.0–52.0)
HEMOGLOBIN: 8.8 g/dL — AB (ref 13.0–17.0)
MCH: 26.1 pg (ref 26.0–34.0)
MCHC: 32.4 g/dL (ref 30.0–36.0)
MCV: 80.7 fL (ref 78.0–100.0)
PLATELETS: 425 10*3/uL — AB (ref 150–400)
RBC: 3.37 MIL/uL — AB (ref 4.22–5.81)
RDW: 18.4 % — ABNORMAL HIGH (ref 11.5–15.5)
WBC: 6.8 10*3/uL (ref 4.0–10.5)

## 2016-03-12 MED ORDER — SODIUM CHLORIDE 0.9 % IV SOLN
Freq: Once | INTRAVENOUS | Status: AC
  Administered 2016-03-12: 16 mg via INTRAVENOUS
  Filled 2016-03-12: qty 8

## 2016-03-12 MED ORDER — HEPARIN SOD (PORK) LOCK FLUSH 100 UNIT/ML IV SOLN: 500.0000 [IU] | Freq: Once | INTRAVENOUS | Status: DC | PRN

## 2016-03-12 MED ORDER — DIPHENHYDRAMINE HCL 50 MG/ML IJ SOLN: 50.0000 mg | Freq: Once | INTRAMUSCULAR | Status: DC | PRN

## 2016-03-12 MED ORDER — DIPHENHYDRAMINE HCL 50 MG/ML IJ SOLN: 25.0000 mg | Freq: Once | INTRAMUSCULAR | Status: DC | PRN

## 2016-03-12 MED ORDER — SODIUM CHLORIDE 0.9 % IV SOLN: Freq: Once | INTRAVENOUS | Status: DC | PRN

## 2016-03-12 MED ORDER — EPINEPHRINE HCL 1 MG/ML IJ SOLN
0.5000 mg | Freq: Once | INTRAMUSCULAR | Status: DC | PRN
  Filled 2016-03-12: qty 0.5

## 2016-03-12 MED ORDER — SODIUM CHLORIDE 0.9 % IV SOLN
750.0000 mg/m2 | Freq: Once | INTRAVENOUS | Status: AC
  Administered 2016-03-12: 1560 mg via INTRAVENOUS
  Filled 2016-03-12: qty 78

## 2016-03-12 MED ORDER — DIPHENHYDRAMINE HCL 50 MG PO CAPS
50.0000 mg | ORAL_CAPSULE | Freq: Once | ORAL | Status: AC
  Administered 2016-03-12: 50 mg via ORAL
  Filled 2016-03-12: qty 1

## 2016-03-12 MED ORDER — HEPARIN SOD (PORK) LOCK FLUSH 100 UNIT/ML IV SOLN
500.0000 [IU] | Freq: Once | INTRAVENOUS | Status: DC
  Filled 2016-03-12: qty 5

## 2016-03-12 MED ORDER — SODIUM CHLORIDE 0.9 % IV SOLN: Freq: Once | INTRAVENOUS | Status: DC

## 2016-03-12 MED ORDER — EPINEPHRINE HCL 0.1 MG/ML IJ SOSY
0.2500 mg | PREFILLED_SYRINGE | Freq: Once | INTRAMUSCULAR | Status: DC | PRN
  Filled 2016-03-12: qty 10

## 2016-03-12 MED ORDER — SODIUM CHLORIDE 0.9 % IV SOLN
375.0000 mg/m2 | Freq: Once | INTRAVENOUS | Status: AC
Start: 2016-03-12 — End: 2016-03-12
  Administered 2016-03-12: 800 mg via INTRAVENOUS
  Filled 2016-03-12: qty 50

## 2016-03-12 MED ORDER — HEPARIN SOD (PORK) LOCK FLUSH 100 UNIT/ML IV SOLN: 250.0000 [IU] | Freq: Once | INTRAVENOUS | Status: DC | PRN

## 2016-03-12 MED ORDER — ALBUTEROL SULFATE (2.5 MG/3ML) 0.083% IN NEBU: 2.5000 mg | INHALATION_SOLUTION | Freq: Once | RESPIRATORY_TRACT | Status: DC | PRN

## 2016-03-12 MED ORDER — METHYLPREDNISOLONE SODIUM SUCC 125 MG IJ SOLR: 125.0000 mg | Freq: Once | INTRAMUSCULAR | Status: DC | PRN

## 2016-03-12 MED ORDER — ACETAMINOPHEN 325 MG PO TABS
650.0000 mg | ORAL_TABLET | Freq: Once | ORAL | Status: AC
  Administered 2016-03-12: 650 mg via ORAL
  Filled 2016-03-12: qty 2

## 2016-03-12 MED ORDER — ALTEPLASE 2 MG IJ SOLR
2.0000 mg | Freq: Once | INTRAMUSCULAR | Status: DC | PRN
  Filled 2016-03-12: qty 2

## 2016-03-12 MED ORDER — FAMOTIDINE IN NACL 20-0.9 MG/50ML-% IV SOLN: 20.0000 mg | Freq: Once | INTRAVENOUS | Status: DC | PRN

## 2016-03-12 MED ORDER — SODIUM CHLORIDE 0.9% FLUSH: 3.0000 mL | INTRAVENOUS | Status: DC | PRN

## 2016-03-12 MED ORDER — SODIUM CHLORIDE 0.9% FLUSH: 10.0000 mL | INTRAVENOUS | Status: DC | PRN

## 2016-03-12 NOTE — Progress Notes (Signed)
Cytoxan dosage and dilution verified with 2nd chemo competent RN Reyne Dumas.

## 2016-03-12 NOTE — Discharge Instructions (Signed)
-   Followup in the cancer clinic at 12:45 pm tomorrow for neulasta shot at 1pm -return to clinic with Dr Irene Limbo in 2 weeks with labs -Hold Aspirin and plavix for 7 days prior to next intrathecal(spinal) chemotherapy on 10/30

## 2016-03-12 NOTE — Discharge Summary (Signed)
Erik Vaughn  [phone] [phone]    Physician Discharge Summary     Patient ID: Erik Vaughn MRN: [id number] DOB/AGE: 64-Jun-1952 64 y.o.  Admit date: 03/08/2016 Discharge date: 03/12/2016  Primary Care Physician:  Ernestene Kiel, MD   Discharge Diagnoses:    Present on Admission: . 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   aspirin EC 81 MG tablet Take 81 mg by mouth daily.   CENTRUM SILVER PO Take 1 tablet by mouth daily with breakfast.   clopidogrel 75 MG tablet Commonly known as:  PLAVIX Take 75 mg by mouth daily.   dexamethasone 4 MG tablet Commonly known as:  DECADRON Take 2 tablets (8 mg total) by mouth 2 (two) times daily with a meal. Take two times a day starting the day after chemotherapy for 3 days.   lisinopril 20 MG tablet Commonly known as:  PRINIVIL,ZESTRIL Take 20 mg by mouth daily.   LORazepam 0.5 MG tablet Commonly known as:  ATIVAN Take 1 tablet (0.5 mg total) by mouth every 6 (six) hours as needed (Nausea or vomiting).   magic mouthwash w/lidocaine Soln Take 5 mLs by mouth 4 (four) times daily. What changed:  when to take this  reasons to take this   mirtazapine 15 MG tablet Commonly known as:  REMERON Take 1 tablet (15 mg total) by mouth at bedtime.   ondansetron 8 MG tablet Commonly known as:  ZOFRAN Take 1 tablet (8 mg total) by mouth 2 (two) times daily. Take two times a day starting the day after chemo for 3 days. Then take two times a day as needed for nausea or vomiting.   oxyCODONE 5 MG immediate release tablet Commonly known as:  Oxy  IR/ROXICODONE Take 1-2 tablets (5-10 mg total) by mouth every 6 (six) hours as needed for moderate pain or severe pain.   polyethylene glycol packet Commonly known as:  MIRALAX Take 17 g by mouth daily as needed for moderate constipation.   prochlorperazine 10 MG tablet Commonly known as:  COMPAZINE Take 1 tablet (10 mg total) by mouth every 6 (six) hours as needed (Nausea or vomiting).   senna-docusate 8.6-50 MG tablet Commonly known as:  SENNA S Take 2 tablets by mouth at bedtime as needed for moderate constipation.   vitamin C 500 MG tablet Commonly known as:  ASCORBIC ACID Take 500 mg by mouth 2 (two) times daily.   Vitamin D3 2000 units Tabs Take 2,000 Units by mouth daily with lunch.        Disposition and Follow-up:   Significant Diagnostic Studies:  Nm Pet Image Restag (ps) Skull Base To Thigh  Result Date: 03/02/2016 CLINICAL DATA:  Subsequent treatment strategy for Burkitt's lymphoma status post 3 cycles chemotherapy. EXAM: NUCLEAR MEDICINE PET SKULL BASE TO THIGH TECHNIQUE: 9.2 mCi F-18 FDG was injected intravenously. Full-ring PET imaging was performed from the skull base to thigh after the radiotracer. CT data was obtained and used for attenuation correction and anatomic localization. FASTING BLOOD GLUCOSE:  Value: 129 mg/dl COMPARISON:  12/25/2015 PET-CT. FINDINGS: NECK No hypermetabolic lymph nodes  in the neck. CHEST No hypermetabolic or enlarged axillary, mediastinal or hilar nodes. Previously described hypermetabolic high left mediastinal node has resolved. Previously visualized left hilar hypermetabolic node has resolved. Mild centrilobular emphysema. Solid 5 mm right middle lobe pulmonary nodule (series 8/image 35) is stable and below PET resolution. No acute consolidative airspace disease or additional significant pulmonary nodules. Previously described hypermetabolic 1.3 cm central right upper lobe pulmonary nodule has resolved. Right internal jugular MediPort  terminates near the cavoatrial junction. Left anterior descending, left circumflex and right coronary atherosclerosis. Atherosclerotic nonaneurysmal thoracic aorta. No pleural effusions. ABDOMEN/PELVIS Hypermetabolic irregular 5.7 x 2.7 cm central mesenteric nodal conglomerate (series 4/image 151) with max SUV 4.9, previously 10.6 x 7.1 cm with max SUV 41.5, significantly decreased in size and metabolism. No additional pathologically enlarged or hypermetabolic lymph nodes in the abdomen or pelvis. Previously described hypermetabolic left pelvic adenopathy has resolved. No abnormal hypermetabolic activity within the liver, pancreas, adrenal glands, or spleen. Moderate hiatal hernia. Stable diffuse bladder wall thickening. Bladder is nearly collapsed. Stable moderately enlarged prostate with nonspecific internal prostatic calcifications. Atherosclerotic nonaneurysmal abdominal aorta. SKELETON There is mild diffuse marrow hypermetabolism throughout the axial skeleton, consistent with reactive marrow state given ongoing chemotherapy. There is residual hypermetabolism (max SUV 6.3) at the site of the pathologic fracture associated with the mixed lytic and sclerotic left sacral lesion, which is increased in sclerosis on the CT images and decreased significantly in metabolism (previous max SUV 31.1). No residual skeletal hypermetabolism at the previously visualized sites in the medial left iliac bone and left superior acetabulum. No residual soft tissue component in the left posterior ninth rib lesion. Mild residual hypermetabolism at the site of a pathologic fracture in the left posterior ninth rib (max SUV 4.3), significantly decreased in metabolism (previous max SUV 40.6). No residual soft tissue component or residual hypermetabolism at the left anterior third rib bone lesion. No residual hypermetabolism in the right anterior seventh rib or right proximal femoral lesions. No new hypermetabolic skeletal lesions.  IMPRESSION: 1. Significant interval metabolic response. 2. Previously described hypermetabolic high left mediastinal and left hilar nodal disease and right upper lobe pulmonary nodule have resolved. 3. Hypermetabolic conglomerate mediastinal adenopathy is significantly decreased in size and metabolism. Previously described left pelvic hypermetabolic adenopathy has resolved. 4. Previously described hypermetabolic skeletal lesions in the right anterior seventh rib, left anterior third rib, right proximal femur and left iliac bone have resolved. Residual hypermetabolism at the sites of pathologic fractures in the left sacral and left posterior ninth rib lesions, significantly decreased in metabolism, with resolution of the soft tissue component in the left posterior ninth rib lesion. 5. No new or progressive hypermetabolic sites of disease. 6. Solitary right middle lobe 5 mm pulmonary nodule is stable and below PET resolution, probably benign. 7. Mild diffuse marrow hypermetabolism throughout the axial skeleton, consistent with reactive marrow state given ongoing chemotherapy. 8. Additional findings include aortic atherosclerosis, three-vessel coronary atherosclerosis, mild centrilobular emphysema, stable diffuse bladder wall thickening with moderately enlarged prostate, and moderate hiatal hernia. Electronically Signed   By: Ilona Sorrel M.D.   On: 03/02/2016 12:26   Dg Fluoro Guided Loc Of Needle/cath Tip For Spinal Inject Rt  Result Date: 03/08/2016 CLINICAL DATA:  65 year old male presents for his third intrathecal chemotherapy injection. Burkitt lymphoma. Subsequent encounter. EXAM: FLUOROSCOPICALLY GUIDED LUMBAR PUNCTURE FOR INTRATHECAL CHEMOTHERAPY TECHNIQUE: Informed consent was obtained from the patient prior to the procedure, including potential complications of headache, allergy, and pain. A 'time out' was performed.  With the patient prone, the lower back was prepped with Betadine. 1% Lidocaine was used  for local anesthesia. Lumbar puncture was performed at the L2-L3 level using left sub laminar technique, and a 3.5 inch x 20 gauge needle with return of clear CSF. The chemotherapy mixture sent from pharmacy labeled with the patient's name and date of birth was slowly injected into the subarachnoid space. The patient tolerated the procedure well without apparent complication. Appropriate postprocedural orders were placed in Epic and he was returned to the inpatient ward for further treatment. FLUOROSCOPY TIME:  0 minutes 12 seconds IMPRESSION: Intrathecal injection of chemotherapy at I1-C3 without complication. Electronically Signed   By: Genevie Ann M.D.   On: 03/08/2016 14:14   Dg Fluoro Guided Loc Of Needle/cath Tip For Spinal Inject Rt  Result Date: 02/16/2016 CLINICAL DATA:  Lumbar puncture with chemotherapy injection, diagnostic and therapeutic. Burkitt's lymphoma. EXAM: FLUOROSCOPICALLY GUIDED LUMBAR PUNCTURE FOR INTRATHECAL CHEMOTHERAPY TECHNIQUE: Informed consent was obtained from the patient prior to the procedure, including potential complications of headache, allergy, and pain. A 'time out' was performed. With the patient prone, the lower back was prepped with Betadine. 1% Lidocaine was used for local anesthesia. Lumbar puncture was performed at the L2-3 using a 20 gauge needle with return of clear CSF. Opening pressure 14 cm of water. 10 cc of clear CSF was collected. 12 mg methotrexate with 50 mg hydrocortisone in a 10 mL solutionwas injected into the subarachnoid space. The patient tolerated the procedure well without apparent complication. FLUOROSCOPY TIME:  20 seconds IMPRESSION: Intrathecal injection of chemotherapy without complication Electronically Signed   By: Rolm Baptise M.D.   On: 02/16/2016 12:54   Dg Fluoro Guide Lumbar Puncture  Result Date: 02/16/2016 CLINICAL DATA:  Lumbar puncture with chemotherapy injection, diagnostic and therapeutic. Burkitt's lymphoma. EXAM: FLUOROSCOPICALLY  GUIDED LUMBAR PUNCTURE FOR INTRATHECAL CHEMOTHERAPY TECHNIQUE: Informed consent was obtained from the patient prior to the procedure, including potential complications of headache, allergy, and pain. A 'time out' was performed. With the patient prone, the lower back was prepped with Betadine. 1% Lidocaine was used for local anesthesia. Lumbar puncture was performed at the L2-3 using a 20 gauge needle with return of clear CSF. Opening pressure 14 cm of water. 10 cc of clear CSF was collected. 12 mg methotrexate with 50 mg hydrocortisone in a 10 mL solutionwas injected into the subarachnoid space. The patient tolerated the procedure well without apparent complication. FLUOROSCOPY TIME:  20 seconds IMPRESSION: Intrathecal injection of chemotherapy without complication Electronically Signed   By: Rolm Baptise M.D.   On: 02/16/2016 12:54    Discharge Laboratory Values: . CBC Latest Ref Rng & Units 03/12/2016 03/11/2016 03/10/2016  WBC 4.0 - 10.5 K/uL 6.8 14.0(H) 14.5(H)  Hemoglobin 13.0 - 17.0 g/dL 8.8(L) 9.5(L) 8.7(L)  Hematocrit 39.0 - 52.0 % 27.2(L) 29.8(L) 26.7(L)  Platelets 150 - 400 K/uL 425(H) 445(H) 399   . CMP Latest Ref Rng & Units 03/12/2016 03/11/2016 03/10/2016  Glucose 65 - 99 mg/dL 157(H) 163(H) 175(H)  BUN 6 - 20 mg/dL 19 23(H) 16  Creatinine 0.61 - 1.24 mg/dL 0.58(L) 0.64 0.69  Sodium 135 - 145 mmol/L 140 138 138  Potassium 3.5 - 5.1 mmol/L 3.8 4.1 4.5  Chloride 101 - 111 mmol/L 105 103 107  CO2 22 - 32 mmol/L 27 27 25   Calcium 8.9 - 10.3 mg/dL 8.9 9.2 9.0  Total Protein 6.5 - 8.1 g/dL 5.7(L) 6.2(L) 6.0(L)  Total Bilirubin 0.3 - 1.2 mg/dL 0.6 0.5  0.2(L)  Alkaline Phos 38 - 126 U/L 66 74 69  AST 15 - 41 U/L 24 20 19   ALT 17 - 63 U/L 22 19 16(L)     Brief H and P: For complete details please refer to admission H and P, but in brief, Patient is a 65 year old Caucasian male with Burkitt's lymphoma admitted for cycle 4 of EPOCH-R chemotherapy along with CNS prophylaxis with  intrathecal day 1 methotrexate. Patient tolerated the treatment well and had no acute side effects or concerns during hospitalization. He tolerated the lumbar puncture and intrathecal methotrexate well without headaches or back pains.  Issues during hospitalization   1) Stage IVB Burkitt's lymphoma with t(8;14) translocation diagnosed 12/23/2015. This appears to be involving the sacrum causing sacral and root involvement with possible neurogenic bladder. PET CT scan as noted above shows extensive involvement with lymphoma. CSF negative for involvement. Bone marrow biopsy negative for involvement by lymphoma. PET/CT after 3 cycles of treatment showed excellent metabolic response to treatment.  CURRENT TREATMENT The patient has now completed 4cycles of EPOCH -R today is C4D5 of treatment  Planned treatment 6 cycles of EPOCH-R  IT methotrexate x 4 doses starting with cycle 3 for CNS prophylaxis All of the patients questions were answered with apparent satisfaction. The patient knows to call the clinic with any problems, questions or concerns. PLAN -he is labs are stable at discharge. No issues with nausea or vomiting. -Outpatient Neulasta on day 6 at 1pm. -infection prevention precautions stressed. -RTC with Dr Irene Limbo in 2 weeks   2) recent diarrhea with GI panel positive for Vibrio cholerae. ID consulted - suggest that the Vibrio cholerae was likely false positive. Given that the patient has Burkitt's lymphoma and is on immunosuppressive chemotherapy we did empirically treat him with one dose of doxycycline. Diarrhea resolved. Plan -patient counseled to avoid eating out too frequently.  3) HTN -continue outpatient medications   4) PAD - has been on outpatient aspirin and Plavix . Was held for his planned lumbar puncture . -now back on ASA and plavix -will need to hold both 1 week prior to next IT methotrexate with next cycle.  Physical Exam at Discharge: BP 124/75 (BP  Location: Right Arm)   Pulse 82   Temp 97.8 F (36.6 C) (Oral)   Resp 14   Ht 6' (1.829 m)   Wt 175 lb (79.4 kg)   SpO2 98%   BMI 23.73 kg/m  GENERAL:alert, in no acute distress and comfortable SKIN: skin color, texture, turgor are normal, no rashes or significant lesions EYES: normal, conjunctiva are pink and non-injected, sclera clear OROPHARYNX:no exudate, no erythema and lips, buccal mucosa, and tongue normal  NECK: supple, no JVD, thyroid normal size, non-tender, without nodularity LYMPH: no palpable lymphadenopathy in the cervical, axillary or inguinal LUNGS: clear to auscultation with normal respiratory effort HEART: regular rate &rhythm, no murmurs and no lower extremity edema ABDOMEN: abdomen soft, non-tender, normoactive bowel sounds  Musculoskeletal: no cyanosis of digits and no clubbing  PSYCH: alert & oriented x 3 with fluent speech NEURO: no focal motor/sensory deficits  Hospital Course:  Active Problems:   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 Sullivan Lone MD Dewy Rose [phone]  03/12/2016, 2:16 PM TT>85mns

## 2016-03-12 NOTE — Progress Notes (Signed)
Rituxan dosage and dilution verified with 2nd chemo competent RN Reyne Dumas.

## 2016-03-13 ENCOUNTER — Ambulatory Visit (HOSPITAL_BASED_OUTPATIENT_CLINIC_OR_DEPARTMENT_OTHER): Payer: PRIVATE HEALTH INSURANCE

## 2016-03-13 VITALS — BP 151/74 | HR 91 | Temp 98.5°F | Resp 18

## 2016-03-13 DIAGNOSIS — C8378 Burkitt lymphoma, lymph nodes of multiple sites: Secondary | ICD-10-CM | POA: Diagnosis not present

## 2016-03-13 LAB — STOOL CULTURE: E coli, Shiga toxin Assay: NEGATIVE

## 2016-03-13 LAB — STOOL CULTURE REFLEX - RSASHR

## 2016-03-13 LAB — STOOL CULTURE REFLEX - CMPCXR

## 2016-03-13 MED ORDER — PEGFILGRASTIM INJECTION 6 MG/0.6ML ~~LOC~~
6.0000 mg | PREFILLED_SYRINGE | Freq: Once | SUBCUTANEOUS | Status: AC
  Administered 2016-03-13: 6 mg via SUBCUTANEOUS

## 2016-03-13 NOTE — Progress Notes (Signed)
Erik Vaughn   HEMATOLOGY/ONCOLOGY INPATIENT PROGRESS NOTE  Date of Service: 03/11/2016   SUBJECTIVE  Patient seen 03/11/2016 PM. Getting C4D4 today. No nausea. No other issues. Erik Vaughn to go home tomorrow.   OBJECTIVE:  NAD  PHYSICAL EXAMINATION: . Vitals:   03/11/16 1453 03/11/16 2250 03/12/16 0606 03/12/16 1500  BP: (!) 151/76 (!) 148/77 124/75 136/74  Pulse: 84 (!) 104 82 77  Resp: 17 16 14 18   Temp: 97.9 F (36.6 C) 97.9 F (36.6 C) 97.8 F (36.6 C) 97.9 F (36.6 C)  TempSrc: Oral Oral Oral Oral  SpO2: 96% 100% 98% 98%  Weight:      Height:       Filed Weights   03/08/16 0845  Weight: 175 lb (79.4 kg)   .Body mass index is 23.73 kg/m.  GENERAL:alert, in no acute distress and comfortable SKIN: skin color, texture, turgor are normal, no rashes or significant lesions EYES: normal, conjunctiva are pink and non-injected, sclera clear OROPHARYNX:no exudate, no erythema and lips, buccal mucosa, and tongue normal  NECK: supple, no JVD, thyroid normal size, non-tender, without nodularity LYMPH:  no palpable lymphadenopathy in the cervical, axillary or inguinal LUNGS: clear to auscultation with normal respiratory effort HEART: regular rate & rhythm,  no murmurs and no lower extremity edema ABDOMEN: abdomen soft, non-tender, normoactive bowel sounds  Musculoskeletal: no cyanosis of digits and no clubbing  PSYCH: alert & oriented x 3 with fluent speech NEURO: no focal motor/sensory deficits   MEDICAL HISTORY:  Past Medical History:  Diagnosis Date  . Cancer ([place])    [place] Lymphoma  . Hypertension   . PAD (peripheral artery disease) ([facility]) left leg    SURGICAL HISTORY: Past Surgical History:  Procedure Laterality Date  . IR GENERIC HISTORICAL  12/31/2015   IR FLUORO GUIDE CV LINE RIGHT 12/31/2015 Arne Cleveland, MD [facility]  . IR GENERIC HISTORICAL  12/31/2015   IR US GUIDE VASC ACCESS RIGHT 12/31/2015 Arne Cleveland, MD [facility]  . left salivary Left    removed     SOCIAL HISTORY: Social History   Social History  . Marital status: Married    Spouse name: N/A  . Number of children: N/A  . Years of education: N/A   Occupational History  . meter specialist    Social History Main Topics  . Smoking status: Former Smoker    Packs/day: 2.00    Years: 16.00    Quit date: 12/22/2005  . Smokeless tobacco: Never Used  . Alcohol use Yes     Comment: occasionally  . Drug use: No  . Sexual activity: No   Other Topics Concern  . Not on file   Social History Narrative  . No narrative on file    FAMILY HISTORY: Family History  Problem Relation Age of Onset  . Alzheimer's disease Father 66    ALLERGIES:  has No Known Allergies.  MEDICATIONS:  .No current facility-administered medications for this encounter.   Current Outpatient Prescriptions:  .  amLODipine (NORVASC) 10 MG tablet, Take 10 mg by mouth daily., Disp: , Rfl:  .  Cholecalciferol (VITAMIN D3) 2000 units TABS, Take 2,000 Units by mouth daily with lunch. , Disp: , Rfl:  .  dexamethasone (DECADRON) 4 MG tablet, Take 2 tablets (8 mg total) by mouth 2 (two) times daily with a meal. Take two times a day starting the day after chemotherapy for 3 days., Disp: 30 tablet, Rfl: 1 .  lisinopril (PRINIVIL,ZESTRIL) 20 MG tablet, Take 20 mg  by mouth daily., Disp: , Rfl:  .  LORazepam (ATIVAN) 0.5 MG tablet, Take 1 tablet (0.5 mg total) by mouth every 6 (six) hours as needed (Nausea or vomiting)., Disp: 30 tablet, Rfl: 0 .  magic mouthwash w/lidocaine SOLN, Take 5 mLs by mouth 4 (four) times daily. (Patient taking differently: Take 5 mLs by mouth 4 (four) times daily as needed for mouth pain. ), Disp: 120 mL, Rfl: 0 .  mirtazapine (REMERON) 15 MG tablet, Take 1 tablet (15 mg total) by mouth at bedtime., Disp: 30 tablet, Rfl: 2 .  Multiple Vitamins-Minerals (CENTRUM SILVER PO), Take 1 tablet by mouth daily with breakfast. , Disp: , Rfl:  .  ondansetron (ZOFRAN) 8 MG tablet, Take 1 tablet (8 mg  total) by mouth 2 (two) times daily. Take two times a day starting the day after chemo for 3 days. Then take two times a day as needed for nausea or vomiting., Disp: 30 tablet, Rfl: 1 .  oxyCODONE (OXY IR/ROXICODONE) 5 MG immediate release tablet, Take 1-2 tablets (5-10 mg total) by mouth every 6 (six) hours as needed for moderate pain or severe pain., Disp: 60 tablet, Rfl: 0 .  polyethylene glycol (MIRALAX) packet, Take 17 g by mouth daily as needed for moderate constipation., Disp: , Rfl:  .  prochlorperazine (COMPAZINE) 10 MG tablet, Take 1 tablet (10 mg total) by mouth every 6 (six) hours as needed (Nausea or vomiting)., Disp: 30 tablet, Rfl: 1 .  senna-docusate (SENNA S) 8.6-50 MG tablet, Take 2 tablets by mouth at bedtime as needed for moderate constipation., Disp: , Rfl:  .  vitamin C (ASCORBIC ACID) 500 MG tablet, Take 500 mg by mouth 2 (two) times daily., Disp: , Rfl:  .  aspirin EC 81 MG tablet, Take 81 mg by mouth daily., Disp: , Rfl:  .  clopidogrel (PLAVIX) 75 MG tablet, Take 75 mg by mouth daily., Disp: , Rfl:    REVIEW OF SYSTEMS:    10 Point review of Systems was done is negative except as noted above.   LABORATORY DATA:  I have reviewed the data as listed  . CBC Latest Ref Rng & Units 03/11/2016 03/10/2016  WBC 4.0 - 10.5 K/uL 14.0(H) 14.5(H)  Hemoglobin 13.0 - 17.0 g/dL 9.5(L) 8.7(L)  Hematocrit 39.0 - 52.0 % 29.8(L) 26.7(L)  Platelets 150 - 400 K/uL 445(H) 399    . CMP Latest Ref Rng & Units 03/11/2016 03/10/2016  Glucose 65 - 99 mg/dL 163(H) 175(H)  BUN 6 - 20 mg/dL 23(H) 16  Creatinine 0.61 - 1.24 mg/dL 0.64 0.69  Sodium 135 - 145 mmol/L 138 138  Potassium 3.5 - 5.1 mmol/L 4.1 4.5  Chloride 101 - 111 mmol/L 103 107  CO2 22 - 32 mmol/L 27 25  Calcium 8.9 - 10.3 mg/dL 9.2 9.0  Total Protein 6.5 - 8.1 g/dL 6.2(L) 6.0(L)  Total Bilirubin 0.3 - 1.2 mg/dL 0.5 0.2(L)  Alkaline Phos 38 - 126 U/L 74 69  AST 15 - 41 U/L 20 19  ALT 17 - 63 U/L 19 16(L)      RADIOGRAPHIC STUDIES: I have personally reviewed the radiological images as listed and agreed with the findings in the report. Nm Pet Image Restag (ps) Skull Base To Thigh  Result Date: 03/02/2016 CLINICAL DATA:  Subsequent treatment strategy for Burkitt's lymphoma status post 3 cycles chemotherapy. EXAM: NUCLEAR MEDICINE PET SKULL BASE TO THIGH TECHNIQUE: 9.2 mCi F-18 FDG was injected intravenously. Full-ring PET imaging was performed from the skull  base to thigh after the radiotracer. CT data was obtained and used for attenuation correction and anatomic localization. FASTING BLOOD GLUCOSE:  Value: 129 mg/dl COMPARISON:  12/25/2015 PET-CT. FINDINGS: NECK No hypermetabolic lymph nodes in the neck. CHEST No hypermetabolic or enlarged axillary, mediastinal or hilar nodes. Previously described hypermetabolic high left mediastinal node has resolved. Previously visualized left hilar hypermetabolic node has resolved. Mild centrilobular emphysema. Solid 5 mm right middle lobe pulmonary nodule (series 8/image 35) is stable and below PET resolution. No acute consolidative airspace disease or additional significant pulmonary nodules. Previously described hypermetabolic 1.3 cm central right upper lobe pulmonary nodule has resolved. Right internal jugular MediPort terminates near the cavoatrial junction. Left anterior descending, left circumflex and right coronary atherosclerosis. Atherosclerotic nonaneurysmal thoracic aorta. No pleural effusions. ABDOMEN/PELVIS Hypermetabolic irregular 5.7 x 2.7 cm central mesenteric nodal conglomerate (series 4/image 151) with max SUV 4.9, previously 10.6 x 7.1 cm with max SUV 41.5, significantly decreased in size and metabolism. No additional pathologically enlarged or hypermetabolic lymph nodes in the abdomen or pelvis. Previously described hypermetabolic left pelvic adenopathy has resolved. No abnormal hypermetabolic activity within the liver, pancreas, adrenal glands, or  spleen. Moderate hiatal hernia. Stable diffuse bladder wall thickening. Bladder is nearly collapsed. Stable moderately enlarged prostate with nonspecific internal prostatic calcifications. Atherosclerotic nonaneurysmal abdominal aorta. SKELETON There is mild diffuse marrow hypermetabolism throughout the axial skeleton, consistent with reactive marrow state given ongoing chemotherapy. There is residual hypermetabolism (max SUV 6.3) at the site of the pathologic fracture associated with the mixed lytic and sclerotic left sacral lesion, which is increased in sclerosis on the CT images and decreased significantly in metabolism (previous max SUV 31.1). No residual skeletal hypermetabolism at the previously visualized sites in the medial left iliac bone and left superior acetabulum. No residual soft tissue component in the left posterior ninth rib lesion. Mild residual hypermetabolism at the site of a pathologic fracture in the left posterior ninth rib (max SUV 4.3), significantly decreased in metabolism (previous max SUV 40.6). No residual soft tissue component or residual hypermetabolism at the left anterior third rib bone lesion. No residual hypermetabolism in the right anterior seventh rib or right proximal femoral lesions. No new hypermetabolic skeletal lesions. IMPRESSION: 1. Significant interval metabolic response. 2. Previously described hypermetabolic high left mediastinal and left hilar nodal disease and right upper lobe pulmonary nodule have resolved. 3. Hypermetabolic conglomerate mediastinal adenopathy is significantly decreased in size and metabolism. Previously described left pelvic hypermetabolic adenopathy has resolved. 4. Previously described hypermetabolic skeletal lesions in the right anterior seventh rib, left anterior third rib, right proximal femur and left iliac bone have resolved. Residual hypermetabolism at the sites of pathologic fractures in the left sacral and left posterior ninth rib lesions,  significantly decreased in metabolism, with resolution of the soft tissue component in the left posterior ninth rib lesion. 5. No new or progressive hypermetabolic sites of disease. 6. Solitary right middle lobe 5 mm pulmonary nodule is stable and below PET resolution, probably benign. 7. Mild diffuse marrow hypermetabolism throughout the axial skeleton, consistent with reactive marrow state given ongoing chemotherapy. 8. Additional findings include aortic atherosclerosis, three-vessel coronary atherosclerosis, mild centrilobular emphysema, stable diffuse bladder wall thickening with moderately enlarged prostate, and moderate hiatal hernia. Electronically Signed   By: Ilona Sorrel M.D.   On: 03/02/2016 12:26   Dg Fluoro Guided Loc Of Needle/cath Tip For Spinal Inject Rt  Result Date: 03/08/2016 CLINICAL DATA:  65 year old male presents for his third intrathecal chemotherapy injection. Burkitt lymphoma.  Subsequent encounter. EXAM: FLUOROSCOPICALLY GUIDED LUMBAR PUNCTURE FOR INTRATHECAL CHEMOTHERAPY TECHNIQUE: Informed consent was obtained from the patient prior to the procedure, including potential complications of headache, allergy, and pain. A 'time out' was performed. With the patient prone, the lower back was prepped with Betadine. 1% Lidocaine was used for local anesthesia. Lumbar puncture was performed at the L2-L3 level using left sub laminar technique, and a 3.5 inch x 20 gauge needle with return of clear CSF. The chemotherapy mixture sent from pharmacy labeled with the patient's name and date of birth was slowly injected into the subarachnoid space. The patient tolerated the procedure well without apparent complication. Appropriate postprocedural orders were placed in Epic and he was returned to the inpatient ward for further treatment. FLUOROSCOPY TIME:  0 minutes 12 seconds IMPRESSION: Intrathecal injection of chemotherapy at G8-J8 without complication. Electronically Signed   By: Genevie Ann M.D.   On:  03/08/2016 14:14   Dg Fluoro Guided Loc Of Needle/cath Tip For Spinal Inject Rt  Result Date: 02/16/2016 CLINICAL DATA:  Lumbar puncture with chemotherapy injection, diagnostic and therapeutic. Burkitt's lymphoma. EXAM: FLUOROSCOPICALLY GUIDED LUMBAR PUNCTURE FOR INTRATHECAL CHEMOTHERAPY TECHNIQUE: Informed consent was obtained from the patient prior to the procedure, including potential complications of headache, allergy, and pain. A 'time out' was performed. With the patient prone, the lower back was prepped with Betadine. 1% Lidocaine was used for local anesthesia. Lumbar puncture was performed at the L2-3 using a 20 gauge needle with return of clear CSF. Opening pressure 14 cm of water. 10 cc of clear CSF was collected. 12 mg methotrexate with 50 mg hydrocortisone in a 10 mL solutionwas injected into the subarachnoid space. The patient tolerated the procedure well without apparent complication. FLUOROSCOPY TIME:  20 seconds IMPRESSION: Intrathecal injection of chemotherapy without complication Electronically Signed   By: Rolm Baptise M.D.   On: 02/16/2016 12:54   Dg Fluoro Guide Lumbar Puncture  Result Date: 02/16/2016 CLINICAL DATA:  Lumbar puncture with chemotherapy injection, diagnostic and therapeutic. Burkitt's lymphoma. EXAM: FLUOROSCOPICALLY GUIDED LUMBAR PUNCTURE FOR INTRATHECAL CHEMOTHERAPY TECHNIQUE: Informed consent was obtained from the patient prior to the procedure, including potential complications of headache, allergy, and pain. A 'time out' was performed. With the patient prone, the lower back was prepped with Betadine. 1% Lidocaine was used for local anesthesia. Lumbar puncture was performed at the L2-3 using a 20 gauge needle with return of clear CSF. Opening pressure 14 cm of water. 10 cc of clear CSF was collected. 12 mg methotrexate with 50 mg hydrocortisone in a 10 mL solutionwas injected into the subarachnoid space. The patient tolerated the procedure well without apparent  complication. FLUOROSCOPY TIME:  20 seconds IMPRESSION: Intrathecal injection of chemotherapy without complication Electronically Signed   By: Rolm Baptise M.D.   On: 02/16/2016 12:54    ASSESSMENT & PLAN:   1) Stage IVB Burkitt's lymphoma with t(8;14) translocation diagnosed 12/23/2015. This appears to be involving the sacrum causing sacral and root involvement with possible neurogenic bladder. PET CT scan as noted above shows extensive involvement with lymphoma. CSF negative for involvement. Bone marrow biopsy negative for involvement by lymphoma.   CURRENT TREATMENT The patient has completed 3cycles of EPOCH -R today is C4D4 of treatment  Planned treatment 6 cycles of EPOCH-R  IT methotrexate x 4 doses starting with cycle 3 for CNS prophylaxis All of the patients questions were answered with apparent satisfaction. The patient knows to call the clinic with any problems, questions or concerns. PLAN -he is  labs are stable. C4D4 today. No issues with nausea or vomiting. -continue with EPOCH-R as per orders. -We'll continue the same supportive medications. -Outpatient Neulasta on day 6 . -We'll continue to monitor daily CBC and CMP. -discharge tomorrow after completion of treatment. -infection prevention precautions stressed.  2) recent diarrhea with GI panel positive for Vibrio cholerae. ID consulted - suggest that the Vibrio cholerae was likely false positive. Given that the patient has Burkitt's lymphoma and is on immunosuppressive chemotherapy we did empirically treat him with one dose of doxycycline. Diarrhea resolved.  3) DVTprophylaxis - TEDS and SCD . Back on lovenox 4) HTN -continue outpatient medications   5) PAD - has been on outpatient aspirin and Plavix . Was held for his planned lumbar puncture . -now back on ASA and plavix -will need to hold both 1 week prior to next IT methotrexate with next cycle.  I spent 25 minutes counseling the patient face to face.  The total time spent in the appointment was 25 minutes and more than 50% was on counseling and direct patient cares.    Sullivan Lone MD [place] AAHIVMS [facility] Hematology/Oncology Physician [facility]  (Office):       [phone] (Work cell):  [phone] (Fax):           [phone]

## 2016-03-13 NOTE — Patient Instructions (Signed)
Pegfilgrastim injection What is this medicine? PEGFILGRASTIM (PEG fil gra stim) is a long-acting granulocyte colony-stimulating factor that stimulates the growth of neutrophils, a type of white blood cell important in the body's fight against infection. It is used to reduce the incidence of fever and infection in patients with certain types of cancer who are receiving chemotherapy that affects the bone marrow, and to increase survival after being exposed to high doses of radiation. This medicine may be used for other purposes; ask your health care provider or pharmacist if you have questions. What should I tell my health care provider before I take this medicine? They need to know if you have any of these conditions: -kidney disease -latex allergy -ongoing radiation therapy -sickle cell disease -skin reactions to acrylic adhesives (On-Body Injector only) -an unusual or allergic reaction to pegfilgrastim, filgrastim, other medicines, foods, dyes, or preservatives -pregnant or trying to get pregnant -breast-feeding How should I use this medicine? This medicine is for injection under the skin. If you get this medicine at home, you will be taught how to prepare and give the pre-filled syringe or how to use the On-body Injector. Refer to the patient Instructions for Use for detailed instructions. Use exactly as directed. Take your medicine at regular intervals. Do not take your medicine more often than directed. It is important that you put your used needles and syringes in a special sharps container. Do not put them in a trash can. If you do not have a sharps container, call your pharmacist or healthcare provider to get one. Talk to your pediatrician regarding the use of this medicine in children. While this drug may be prescribed for selected conditions, precautions do apply. Overdosage: If you think you have taken too much of this medicine contact a poison control center or emergency room at  once. NOTE: This medicine is only for you. Do not share this medicine with others. What if I miss a dose? It is important not to miss your dose. Call your doctor or health care professional if you miss your dose. If you miss a dose due to an On-body Injector failure or leakage, a new dose should be administered as soon as possible using a single prefilled syringe for manual use. What may interact with this medicine? Interactions have not been studied. Give your health care provider a list of all the medicines, herbs, non-prescription drugs, or dietary supplements you use. Also tell them if you smoke, drink alcohol, or use illegal drugs. Some items may interact with your medicine. This list may not describe all possible interactions. Give your health care provider a list of all the medicines, herbs, non-prescription drugs, or dietary supplements you use. Also tell them if you smoke, drink alcohol, or use illegal drugs. Some items may interact with your medicine. What should I watch for while using this medicine? You may need blood work done while you are taking this medicine. If you are going to need a MRI, CT scan, or other procedure, tell your doctor that you are using this medicine (On-Body Injector only). What side effects may I notice from receiving this medicine? Side effects that you should report to your doctor or health care professional as soon as possible: -allergic reactions like skin rash, itching or hives, swelling of the face, lips, or tongue -dizziness -fever -pain, redness, or irritation at site where injected -pinpoint red spots on the skin -red or dark-brown urine -shortness of breath or breathing problems -stomach or side pain, or pain   at the shoulder -swelling -tiredness -trouble passing urine or change in the amount of urine Side effects that usually do not require medical attention (report to your doctor or health care professional if they continue or are  bothersome): -bone pain -muscle pain This list may not describe all possible side effects. Call your doctor for medical advice about side effects. You may report side effects to FDA at 1-800-FDA-1088. Where should I keep my medicine? Keep out of the reach of children. Store pre-filled syringes in a refrigerator between 2 and 8 degrees C (36 and 46 degrees F). Do not freeze. Keep in carton to protect from light. Throw away this medicine if it is left out of the refrigerator for more than 48 hours. Throw away any unused medicine after the expiration date. NOTE: This sheet is a summary. It may not cover all possible information. If you have questions about this medicine, talk to your doctor, pharmacist, or health care provider.    2016, Elsevier/Gold Standard. (2014-06-06 14:30:14)  

## 2016-03-17 DIAGNOSIS — D709 Neutropenia, unspecified: Secondary | ICD-10-CM | POA: Diagnosis not present

## 2016-03-17 DIAGNOSIS — A419 Sepsis, unspecified organism: Secondary | ICD-10-CM | POA: Diagnosis not present

## 2016-03-17 DIAGNOSIS — E876 Hypokalemia: Secondary | ICD-10-CM

## 2016-03-17 DIAGNOSIS — N390 Urinary tract infection, site not specified: Secondary | ICD-10-CM | POA: Diagnosis not present

## 2016-03-17 LAB — GASTROINTESTINAL PANEL BY PCR, STOOL (REPLACES STOOL CULTURE)
ADENOVIRUS F40/41: NOT DETECTED
Astrovirus: NOT DETECTED
CAMPYLOBACTER SPECIES: NOT DETECTED
CRYPTOSPORIDIUM: NOT DETECTED
CYCLOSPORA CAYETANENSIS: NOT DETECTED
ENTEROPATHOGENIC E COLI (EPEC): NOT DETECTED
Entamoeba histolytica: NOT DETECTED
Enteroaggregative E coli (EAEC): NOT DETECTED
Enterotoxigenic E coli (ETEC): NOT DETECTED
Giardia lamblia: NOT DETECTED
Norovirus GI/GII: NOT DETECTED
PLESIMONAS SHIGELLOIDES: NOT DETECTED
Rotavirus A: NOT DETECTED
SAPOVIRUS (I, II, IV, AND V): NOT DETECTED
Salmonella species: NOT DETECTED
Shiga like toxin producing E coli (STEC): NOT DETECTED
Shigella/Enteroinvasive E coli (EIEC): NOT DETECTED
VIBRIO SPECIES: NOT DETECTED
Vibrio cholerae: NOT DETECTED
YERSINIA ENTEROCOLITICA: NOT DETECTED

## 2016-03-18 DIAGNOSIS — E876 Hypokalemia: Secondary | ICD-10-CM | POA: Diagnosis not present

## 2016-03-18 DIAGNOSIS — A0472 Enterocolitis due to Clostridium difficile, not specified as recurrent: Secondary | ICD-10-CM

## 2016-03-18 DIAGNOSIS — A419 Sepsis, unspecified organism: Secondary | ICD-10-CM | POA: Diagnosis not present

## 2016-03-18 DIAGNOSIS — D709 Neutropenia, unspecified: Secondary | ICD-10-CM | POA: Diagnosis not present

## 2016-03-18 DIAGNOSIS — N390 Urinary tract infection, site not specified: Secondary | ICD-10-CM | POA: Diagnosis not present

## 2016-03-19 DIAGNOSIS — A419 Sepsis, unspecified organism: Secondary | ICD-10-CM | POA: Diagnosis not present

## 2016-03-19 DIAGNOSIS — N390 Urinary tract infection, site not specified: Secondary | ICD-10-CM | POA: Diagnosis not present

## 2016-03-19 DIAGNOSIS — D709 Neutropenia, unspecified: Secondary | ICD-10-CM | POA: Diagnosis not present

## 2016-03-19 DIAGNOSIS — E876 Hypokalemia: Secondary | ICD-10-CM | POA: Diagnosis not present

## 2016-03-20 DIAGNOSIS — C8370 Burkitt lymphoma, unspecified site: Secondary | ICD-10-CM | POA: Diagnosis not present

## 2016-03-20 DIAGNOSIS — D709 Neutropenia, unspecified: Secondary | ICD-10-CM | POA: Diagnosis not present

## 2016-03-20 DIAGNOSIS — N390 Urinary tract infection, site not specified: Secondary | ICD-10-CM

## 2016-03-20 DIAGNOSIS — E876 Hypokalemia: Secondary | ICD-10-CM | POA: Diagnosis not present

## 2016-03-20 DIAGNOSIS — A0472 Enterocolitis due to Clostridium difficile, not specified as recurrent: Secondary | ICD-10-CM

## 2016-03-20 DIAGNOSIS — D649 Anemia, unspecified: Secondary | ICD-10-CM

## 2016-03-22 ENCOUNTER — Telehealth: Payer: Self-pay | Admitting: *Deleted

## 2016-03-22 NOTE — Telephone Encounter (Signed)
This is Daymon Hora calling for [employer].  He needs an appointment this week.  He's scheduled on October 31st but that's too late.  He'll be in the Hospital for chemotherapy before this date.  My return number is [phone]."

## 2016-03-23 ENCOUNTER — Encounter: Payer: Self-pay | Admitting: Hematology

## 2016-03-23 ENCOUNTER — Telehealth: Payer: Self-pay | Admitting: Hematology

## 2016-03-23 ENCOUNTER — Ambulatory Visit (HOSPITAL_BASED_OUTPATIENT_CLINIC_OR_DEPARTMENT_OTHER): Payer: PRIVATE HEALTH INSURANCE | Admitting: Hematology

## 2016-03-23 ENCOUNTER — Other Ambulatory Visit: Payer: Self-pay | Admitting: *Deleted

## 2016-03-23 ENCOUNTER — Ambulatory Visit (HOSPITAL_BASED_OUTPATIENT_CLINIC_OR_DEPARTMENT_OTHER): Payer: PRIVATE HEALTH INSURANCE

## 2016-03-23 VITALS — BP 148/66 | HR 89 | Temp 97.6°F | Resp 16 | Wt 190.3 lb

## 2016-03-23 DIAGNOSIS — C8378 Burkitt lymphoma, lymph nodes of multiple sites: Secondary | ICD-10-CM | POA: Diagnosis not present

## 2016-03-23 DIAGNOSIS — D649 Anemia, unspecified: Secondary | ICD-10-CM | POA: Diagnosis not present

## 2016-03-23 DIAGNOSIS — N390 Urinary tract infection, site not specified: Secondary | ICD-10-CM

## 2016-03-23 DIAGNOSIS — A0472 Enterocolitis due to Clostridium difficile, not specified as recurrent: Secondary | ICD-10-CM | POA: Diagnosis not present

## 2016-03-23 LAB — COMPREHENSIVE METABOLIC PANEL
ALT: 19 U/L (ref 0–55)
ANION GAP: 11 meq/L (ref 3–11)
AST: 28 U/L (ref 5–34)
Albumin: 2.7 g/dL — ABNORMAL LOW (ref 3.5–5.0)
Alkaline Phosphatase: 89 U/L (ref 40–150)
BUN: 8.5 mg/dL (ref 7.0–26.0)
CALCIUM: 8.5 mg/dL (ref 8.4–10.4)
CO2: 26 meq/L (ref 22–29)
CREATININE: 0.6 mg/dL — AB (ref 0.7–1.3)
Chloride: 103 mEq/L (ref 98–109)
EGFR: >90 mL/min/{1.73_m2} (ref >90)
Glucose: 193 mg/dl — ABNORMAL HIGH (ref 70–140)
Potassium: 3.4 mEq/L — ABNORMAL LOW (ref 3.5–5.1)
Sodium: 140 mEq/L (ref 136–145)
TOTAL PROTEIN: 5.5 g/dL — AB (ref 6.4–8.3)

## 2016-03-23 LAB — CBC WITH DIFFERENTIAL/PLATELET
BASO%: 1.1 % (ref 0.0–2.0)
Basophils Absolute: 0.1 10*3/uL (ref 0.0–0.1)
EOS%: 0.3 % (ref 0.0–7.0)
Eosinophils Absolute: 0 10*3/uL (ref 0.0–0.5)
HEMATOCRIT: 30.3 % — AB (ref 38.4–49.9)
HGB: 9.6 g/dL — ABNORMAL LOW (ref 13.0–17.1)
LYMPH#: 0.9 10*3/uL (ref 0.9–3.3)
LYMPH%: 7.6 % — ABNORMAL LOW (ref 14.0–49.0)
MCH: 25.7 pg — ABNORMAL LOW (ref 27.2–33.4)
MCHC: 31.7 g/dL — ABNORMAL LOW (ref 32.0–36.0)
MCV: 80.9 fL (ref 79.3–98.0)
MONO#: 0.8 10*3/uL (ref 0.1–0.9)
MONO%: 7 % (ref 0.0–14.0)
NEUT%: 84 % — ABNORMAL HIGH (ref 39.0–75.0)
NEUTROS ABS: 9.6 10*3/uL — AB (ref 1.5–6.5)
PLATELETS: 194 10*3/uL (ref 140–400)
RBC: 3.75 10*6/uL — ABNORMAL LOW (ref 4.20–5.82)
RDW: 21 % — AB (ref 11.0–14.6)
WBC: 11.4 10*3/uL — AB (ref 4.0–10.3)

## 2016-03-23 LAB — LACTATE DEHYDROGENASE: LDH: 395 U/L — AB (ref 125–245)

## 2016-03-23 MED ORDER — VANCOMYCIN 50 MG/ML ORAL SOLUTION: 125.0000 mg | Freq: Four times a day (QID) | ORAL | 0 refills | Status: DC

## 2016-03-23 NOTE — Patient Instructions (Signed)
-  Continue Vancomycin PO for additional 10 days -I shall give you a new prescription for preventive dose of Vancomycin after hospitalization -I shall give a prescription for Bactrim for infection prevention after hospitalization -hold ASA and plavix -hold probiotics -inpatient admission 03/30/2016

## 2016-03-23 NOTE — Telephone Encounter (Signed)
Neulasta injection scheduled per 03/23/16 los. Lab and follow up appointment rescheduled per M.D. AVS report and appointment schedule given to patient, per 03/23/16 los.

## 2016-03-23 NOTE — Progress Notes (Signed)
Patient and spouse came in inquiring about financial assistance. I asked what type of assistance was needed and they said with hospital bills. I reviewed my notes and saw that I spoke with them back in August to find out if OOP/deductible for insurance had been met for possible copay assistance and gave information on the grant. His spouse states they never called the insurance company because they forgot. I reviewed the billing on the physician side and saw that he does not have a balance. I explained to them that he may have met his OOP and insurance is not leaving him with a balance. I checked the Neulasta and no balance was left, therefore copay assistance not needed at this time. Advised once insurance plan starts over, they may apply at that time. Also provided them with information on LLS if needed once plan starts over. Verbally asked income and was able to approve for the one-time [place]. Patient approved for one-time $400 grant. Patient has a copy of the award as well as the expenses it covers along with the Outpatient pharmacy information. Patient received a gas card today from grant. Patient has my card for any additional financial questions or concerns. Patient and spouse were very appreciative.

## 2016-03-30 ENCOUNTER — Inpatient Hospital Stay (HOSPITAL_COMMUNITY)
Admission: AD | Admit: 2016-03-30 | Discharge: 2016-04-03 | DRG: 847 | Disposition: A | Discharge status: Home / Self Care | Payer: PRIVATE HEALTH INSURANCE | Source: Ambulatory Visit | Attending: Hematology | Admitting: Hematology

## 2016-03-30 ENCOUNTER — Other Ambulatory Visit: Payer: PRIVATE HEALTH INSURANCE

## 2016-03-30 ENCOUNTER — Ambulatory Visit: Payer: PRIVATE HEALTH INSURANCE | Admitting: Hematology

## 2016-03-30 ENCOUNTER — Inpatient Hospital Stay (HOSPITAL_COMMUNITY): Payer: PRIVATE HEALTH INSURANCE

## 2016-03-30 DIAGNOSIS — I10 Essential (primary) hypertension: Secondary | ICD-10-CM

## 2016-03-30 DIAGNOSIS — C8378 Burkitt lymphoma, lymph nodes of multiple sites: Secondary | ICD-10-CM | POA: Diagnosis present

## 2016-03-30 DIAGNOSIS — Z7982 Long term (current) use of aspirin: Secondary | ICD-10-CM

## 2016-03-30 DIAGNOSIS — I739 Peripheral vascular disease, unspecified: Secondary | ICD-10-CM | POA: Diagnosis present

## 2016-03-30 DIAGNOSIS — Z7952 Long term (current) use of systemic steroids: Secondary | ICD-10-CM

## 2016-03-30 DIAGNOSIS — Z79899 Other long term (current) drug therapy: Secondary | ICD-10-CM

## 2016-03-30 DIAGNOSIS — A0472 Enterocolitis due to Clostridium difficile, not specified as recurrent: Secondary | ICD-10-CM | POA: Diagnosis present

## 2016-03-30 DIAGNOSIS — Z87440 Personal history of urinary (tract) infections: Secondary | ICD-10-CM

## 2016-03-30 DIAGNOSIS — Z5111 Encounter for antineoplastic chemotherapy: Secondary | ICD-10-CM | POA: Diagnosis present

## 2016-03-30 DIAGNOSIS — Z95828 Presence of other vascular implants and grafts: Secondary | ICD-10-CM

## 2016-03-30 DIAGNOSIS — Z79891 Long term (current) use of opiate analgesic: Secondary | ICD-10-CM

## 2016-03-30 DIAGNOSIS — N390 Urinary tract infection, site not specified: Secondary | ICD-10-CM | POA: Diagnosis present

## 2016-03-30 DIAGNOSIS — N318 Other neuromuscular dysfunction of bladder: Secondary | ICD-10-CM | POA: Diagnosis present

## 2016-03-30 DIAGNOSIS — Z820 Family history of epilepsy and other diseases of the nervous system: Secondary | ICD-10-CM | POA: Diagnosis not present

## 2016-03-30 DIAGNOSIS — Z87891 Personal history of nicotine dependence: Secondary | ICD-10-CM

## 2016-03-30 DIAGNOSIS — C8370 Burkitt lymphoma, unspecified site: Secondary | ICD-10-CM

## 2016-03-30 LAB — CBC WITH DIFFERENTIAL/PLATELET
BASOS PCT: 1 %
Basophils Absolute: 0.1 10*3/uL (ref 0.0–0.1)
EOS ABS: 0.1 10*3/uL (ref 0.0–0.7)
Eosinophils Relative: 1 %
HEMATOCRIT: 35 % — AB (ref 39.0–52.0)
HEMOGLOBIN: 11.2 g/dL — AB (ref 13.0–17.0)
Lymphocytes Relative: 14 %
Lymphs Abs: 1.4 10*3/uL (ref 0.7–4.0)
MCH: 26.5 pg (ref 26.0–34.0)
MCHC: 32 g/dL (ref 30.0–36.0)
MCV: 82.9 fL (ref 78.0–100.0)
MONOS PCT: 7 %
Monocytes Absolute: 0.7 10*3/uL (ref 0.1–1.0)
NEUTROS ABS: 7.7 10*3/uL (ref 1.7–7.7)
NEUTROS PCT: 76 %
Platelets: 426 10*3/uL — ABNORMAL HIGH (ref 150–400)
RBC: 4.22 MIL/uL (ref 4.22–5.81)
RDW: 19.9 % — ABNORMAL HIGH (ref 11.5–15.5)
WBC: 10.1 10*3/uL (ref 4.0–10.5)

## 2016-03-30 LAB — RETICULOCYTES
RBC.: 4.22 MIL/uL (ref 4.22–5.81)
RETIC COUNT ABSOLUTE: 101.3 10*3/uL (ref 19.0–186.0)
RETIC CT PCT: 2.4 % (ref 0.4–3.1)

## 2016-03-30 LAB — COMPREHENSIVE METABOLIC PANEL
ALK PHOS: 72 U/L (ref 38–126)
ALT: 26 U/L (ref 17–63)
ANION GAP: 8 (ref 5–15)
AST: 24 U/L (ref 15–41)
Albumin: 4 g/dL (ref 3.5–5.0)
BUN: 17 mg/dL (ref 6–20)
CALCIUM: 9.6 mg/dL (ref 8.9–10.3)
CHLORIDE: 99 mmol/L — AB (ref 101–111)
CO2: 29 mmol/L (ref 22–32)
CREATININE: 0.8 mg/dL (ref 0.61–1.24)
Glucose, Bld: 108 mg/dL — ABNORMAL HIGH (ref 65–99)
Potassium: 4.6 mmol/L (ref 3.5–5.1)
SODIUM: 136 mmol/L (ref 135–145)
Total Bilirubin: 0.6 mg/dL (ref 0.3–1.2)
Total Protein: 6.8 g/dL (ref 6.5–8.1)

## 2016-03-30 LAB — PHOSPHORUS: PHOSPHORUS: 4.5 mg/dL (ref 2.5–4.6)

## 2016-03-30 LAB — MAGNESIUM: MAGNESIUM: 2.1 mg/dL (ref 1.7–2.4)

## 2016-03-30 MED ORDER — MAGIC MOUTHWASH W/LIDOCAINE: 5.0000 mL | Freq: Four times a day (QID) | ORAL | Status: DC | PRN

## 2016-03-30 MED ORDER — HEPARIN SOD (PORK) LOCK FLUSH 100 UNIT/ML IV SOLN: 250.0000 [IU] | Freq: Once | INTRAVENOUS | Status: DC | PRN

## 2016-03-30 MED ORDER — SODIUM CHLORIDE 0.9% FLUSH: 10.0000 mL | INTRAVENOUS | Status: DC | PRN

## 2016-03-30 MED ORDER — SODIUM CHLORIDE 0.9% FLUSH: 3.0000 mL | INTRAVENOUS | Status: DC | PRN

## 2016-03-30 MED ORDER — OXYCODONE HCL 5 MG PO TABS: 5.0000 mg | ORAL_TABLET | Freq: Four times a day (QID) | ORAL | Status: DC | PRN

## 2016-03-30 MED ORDER — ONDANSETRON HCL 40 MG/20ML IJ SOLN
Freq: Once | INTRAMUSCULAR | Status: AC
  Administered 2016-03-30: 18 mg via INTRAVENOUS
  Filled 2016-03-30: qty 4

## 2016-03-30 MED ORDER — ALTEPLASE 2 MG IJ SOLR: 2.0000 mg | Freq: Once | INTRAMUSCULAR | Status: DC | PRN

## 2016-03-30 MED ORDER — LISINOPRIL 20 MG PO TABS
20.0000 mg | ORAL_TABLET | Freq: Every day | ORAL | Status: DC
  Administered 2016-03-30 – 2016-04-02 (×4): 20 mg via ORAL
  Filled 2016-03-30 (×4): qty 1

## 2016-03-30 MED ORDER — MIRTAZAPINE 15 MG PO TABS
15.0000 mg | ORAL_TABLET | Freq: Every day | ORAL | Status: DC
  Administered 2016-03-31 – 2016-04-01 (×2): 15 mg via ORAL
  Filled 2016-03-30 (×3): qty 1

## 2016-03-30 MED ORDER — SENNOSIDES-DOCUSATE SODIUM 8.6-50 MG PO TABS: 2.0000 | ORAL_TABLET | Freq: Every evening | ORAL | Status: DC | PRN

## 2016-03-30 MED ORDER — AMLODIPINE BESYLATE 10 MG PO TABS
10.0000 mg | ORAL_TABLET | Freq: Every day | ORAL | Status: DC
  Administered 2016-03-30 – 2016-04-02 (×4): 10 mg via ORAL
  Filled 2016-03-30 (×4): qty 1

## 2016-03-30 MED ORDER — VANCOMYCIN 50 MG/ML ORAL SOLUTION
125.0000 mg | Freq: Four times a day (QID) | ORAL | Status: DC
  Administered 2016-03-30 – 2016-04-03 (×17): 125 mg via ORAL
  Filled 2016-03-30 (×20): qty 2.5

## 2016-03-30 MED ORDER — LORAZEPAM 0.5 MG PO TABS: 0.5000 mg | ORAL_TABLET | Freq: Four times a day (QID) | ORAL | Status: DC | PRN

## 2016-03-30 MED ORDER — SODIUM CHLORIDE 0.9 % IV SOLN
INTRAVENOUS | Status: DC
  Administered 2016-03-30: 13:00:00 via INTRAVENOUS

## 2016-03-30 MED ORDER — METHOTREXATE SODIUM CHEMO INJECTION (PF) 50 MG/2ML
Freq: Once | INTRAMUSCULAR | Status: AC
  Administered 2016-03-30: 13:00:00 via INTRATHECAL
  Filled 2016-03-30: qty 0.48

## 2016-03-30 MED ORDER — VINCRISTINE SULFATE CHEMO INJECTION 1 MG/ML
Freq: Once | INTRAVENOUS | Status: AC
  Administered 2016-03-30: 14:00:00 via INTRAVENOUS
  Filled 2016-03-30: qty 10

## 2016-03-30 MED ORDER — HEPARIN SOD (PORK) LOCK FLUSH 100 UNIT/ML IV SOLN
500.0000 [IU] | Freq: Once | INTRAVENOUS | Status: AC | PRN
  Administered 2016-04-03: 500 [IU]

## 2016-03-30 MED ORDER — PREDNISONE 50 MG PO TABS
60.0000 mg/m2/d | ORAL_TABLET | Freq: Every day | ORAL | Status: AC
  Administered 2016-03-30 – 2016-04-03 (×5): 125.5 mg via ORAL
  Filled 2016-03-30 (×5): qty 3

## 2016-03-30 NOTE — Progress Notes (Signed)
Doxyrubicin, Etoposide and Oncovin dosages and dilution verified with 2nd chemo competent RN Laural Benes.

## 2016-03-30 NOTE — H&P (Signed)
[name]    HEMATOLOGY/ONCOLOGY H&P NOTE  Date of Service: 03/30/2016  Patient Care Team: Ernestene Kiel, MD as PCP - General (Internal Medicine) [name], MD as Consulting Physician (Orthopedic Surgery)  CHIEF COMPLAINTS/PURPOSE OF CONSULTATION:  Burkitts lymphoma admitted for Cycle 5 of EPOCH-R  HISTORY OF PRESENTING ILLNESS:  Erik Vaughn is a wonderful 65 y.o. male who was recently seen in clinic and is being admitted for his 5th cycle of EPOCH-R for Burkitt's lymphoma (with t(8;14) translocation).  He was recently admitted to [facility] and diagnosed with a recurrent urinary tract infection and C. difficile colitis. He had completed levofloxacin for his urinary tract infection. Was given metronidazole for total of 7 days after which we give him a prescription for oral vancomycin to continue for a total of at least 14 days followed by prophylactic dosing while on chemotherapy. He reports that his diarrhea has completely resolved at this time and he has no further fevers. He notes no acute new symptoms and feels okay with proceeding with his planned treatment. No other focal symptoms at this time. No new lymphadenopathy. No abdominal pain. No chest pain or shortness of breath.  MEDICAL HISTORY:  Past Medical History:  Diagnosis Date  . Cancer ([place])    Burkett Lymphoma  . Hypertension   . PAD (peripheral artery disease) ([facility]) left leg    SURGICAL HISTORY: Past Surgical History:  Procedure Laterality Date  . IR GENERIC HISTORICAL  12/31/2015   IR FLUORO GUIDE CV LINE RIGHT 12/31/2015 Arne Cleveland, MD [facility]INTER[facility]  . IR GENERIC HISTORICAL  12/31/2015   IR US GUIDE VASC ACCESS RIGHT 12/31/2015 Arne Cleveland, MD [facility]  . left salivary Left    removed    SOCIAL HISTORY: Social History   Social History  . Marital status: Married    Spouse name: N/A  . Number of children: N/A  . Years of education: N/A   Occupational History  . meter specialist     Social History Main Topics  . Smoking status: Former Smoker    Packs/day: 2.00    Years: 16.00    Quit date: 12/22/2005  . Smokeless tobacco: Never Used  . Alcohol use Yes     Comment: occasionally  . Drug use: No  . Sexual activity: No   Other Topics Concern  . Not on file   Social History Narrative  . No narrative on file    FAMILY HISTORY: Family History  Problem Relation Age of Onset  . Alzheimer's disease Father 57    ALLERGIES:  has No Known Allergies.  MEDICATIONS:  Current Facility-Administered Medications  Medication Dose Route Frequency Provider Last Rate Last Dose  . 0.9 %  sodium chloride infusion   Intravenous Continuous Brunetta Genera, MD 10 mL/hr at 03/30/16 1258    . alteplase (CATHFLO ACTIVASE) injection 2 mg  2 mg Intracatheter Once PRN Brunetta Genera, MD      . amLODipine (NORVASC) tablet 10 mg  10 mg Oral q1800 Brunetta Genera, MD      . DOXOrubicin (ADRIAMYCIN) 20 mg, etoposide (VEPESID) 104 mg, vinCRIStine (ONCOVIN) 0.8 mg in sodium chloride 0.9 % 600 mL chemo infusion   Intravenous Once Brunetta Genera, MD      . heparin lock flush 100 unit/mL  500 Units Intracatheter Once PRN Brunetta Genera, MD      . heparin lock flush 100 unit/mL  250 Units Intracatheter Once PRN Brunetta Genera, MD      .  lisinopril (PRINIVIL,ZESTRIL) tablet 20 mg  20 mg Oral q1800 Brunetta Genera, MD      . LORazepam (ATIVAN) tablet 0.5 mg  0.5 mg Oral Q6H PRN Brunetta Genera, MD      . magic mouthwash w/lidocaine  5 mL Oral QID PRN Brunetta Genera, MD      . mirtazapine (REMERON) tablet 15 mg  15 mg Oral QHS Brunetta Genera, MD      . ondansetron (ZOFRAN) 8 mg, dexamethasone (DECADRON) 10 mg in sodium chloride 0.9 % 50 mL IVPB   Intravenous Once Brunetta Genera, MD      . oxyCODONE (Oxy IR/ROXICODONE) immediate release tablet 5-10 mg  5-10 mg Oral Q6H PRN Brunetta Genera, MD      . predniSONE (DELTASONE) tablet 125.5 mg  60  mg/m2/day (Treatment Plan Recorded) Oral QAC breakfast Brunetta Genera, MD   125.5 mg at 03/30/16 1256  . senna-docusate (Senokot-S) tablet 2 tablet  2 tablet Oral QHS PRN Brunetta Genera, MD      . sodium chloride flush (NS) 0.9 % injection 10 mL  10 mL Intracatheter PRN Brunetta Genera, MD      . sodium chloride flush (NS) 0.9 % injection 3 mL  3 mL Intravenous PRN Brunetta Genera, MD      . vancomycin (VANCOCIN) 50 mg/mL oral solution 125 mg  125 mg Oral Q6H Aubriel Khanna Juleen China, MD   125 mg at 03/30/16 1257    REVIEW OF SYSTEMS:    10 Point review of Systems was done is negative except as noted above.  PHYSICAL EXAMINATION: ECOG PERFORMANCE STATUS: 1 - Symptomatic but completely ambulatory  . Vitals:   03/30/16 0958  BP: 107/70  Pulse: 94  Resp: 18  Temp: 98.1 F (36.7 C)   Filed Weights   03/30/16 0958  Weight: 176 lb 3.2 oz (79.9 kg)   .Body mass index is 23.9 kg/m.  GENERAL:alert, in no acute distress and comfortable SKIN: skin color, texture, turgor are normal, no rashes or significant lesions EYES: normal, conjunctiva are pink and non-injected, sclera clear OROPHARYNX:no exudate, no erythema and lips, buccal mucosa, and tongue normal  NECK: supple, no JVD, thyroid normal size, non-tender, without nodularity LYMPH:  no palpable lymphadenopathy in the cervical, axillary or inguinal LUNGS: clear to auscultation with normal respiratory effort HEART: regular rate & rhythm,  no murmurs and no lower extremity edema ABDOMEN: abdomen soft, non-tender, normoactive bowel sounds  Musculoskeletal: no cyanosis of digits and no clubbing  PSYCH: alert & oriented x 3 with fluent speech NEURO: no focal motor/sensory deficits  LABORATORY DATA:  I have reviewed the data as listed  . CBC Latest Ref Rng & Units 03/30/2016 03/23/2016 03/12/2016  WBC 4.0 - 10.5 K/uL 10.1 11.4(H) 6.8  Hemoglobin 13.0 - 17.0 g/dL 11.2(L) 9.6(L) 8.8(L)  Hematocrit 39.0 - 52.0 % 35.0(L)  30.3(L) 27.2(L)  Platelets 150 - 400 K/uL 426(H) 194 425(H)    . CMP Latest Ref Rng & Units 03/30/2016 03/23/2016 03/12/2016  Glucose 65 - 99 mg/dL 108(H) 193(H) 157(H)  BUN 6 - 20 mg/dL 17 8.5 19  Creatinine 0.61 - 1.24 mg/dL 0.80 0.6(L) 0.58(L)  Sodium 135 - 145 mmol/L 136 140 140  Potassium 3.5 - 5.1 mmol/L 4.6 3.4(L) 3.8  Chloride 101 - 111 mmol/L 99(L) - 105  CO2 22 - 32 mmol/L _0 Calcium 8.9 - 10.3 mg/dL 9.6 8.5 8.9  Total Protein 6.5 - 8.1 g/dL  6.8 5.5(L) 5.7(L)  Total Bilirubin 0.3 - 1.2 mg/dL 0.6 <0.22 0.6  Alkaline Phos 38 - 126 U/L 72 89 66  AST 15 - 41 U/L _0 ALT 17 - 63 U/L _1 RADIOGRAPHIC STUDIES: I have personally reviewed the radiological images as listed and agreed with the findings in the report. Nm Pet Image Restag (ps) Skull Base To Thigh  Result Date: 03/02/2016 CLINICAL DATA:  Subsequent treatment strategy for Burkitt's lymphoma status post 3 cycles chemotherapy. EXAM: NUCLEAR MEDICINE PET SKULL BASE TO THIGH TECHNIQUE: 9.2 mCi F-18 FDG was injected intravenously. Full-ring PET imaging was performed from the skull base to thigh after the radiotracer. CT data was obtained and used for attenuation correction and anatomic localization. FASTING BLOOD GLUCOSE:  Value: 129 mg/dl COMPARISON:  12/25/2015 PET-CT. FINDINGS: NECK No hypermetabolic lymph nodes in the neck. CHEST No hypermetabolic or enlarged axillary, mediastinal or hilar nodes. Previously described hypermetabolic high left mediastinal node has resolved. Previously visualized left hilar hypermetabolic node has resolved. Mild centrilobular emphysema. Solid 5 mm right middle lobe pulmonary nodule (series 8/image 35) is stable and below PET resolution. No acute consolidative airspace disease or additional significant pulmonary nodules. Previously described hypermetabolic 1.3 cm central right upper lobe pulmonary nodule has resolved. Right internal jugular MediPort terminates near the  cavoatrial junction. Left anterior descending, left circumflex and right coronary atherosclerosis. Atherosclerotic nonaneurysmal thoracic aorta. No pleural effusions. ABDOMEN/PELVIS Hypermetabolic irregular 5.7 x 2.7 cm central mesenteric nodal conglomerate (series 4/image 151) with max SUV 4.9, previously 10.6 x 7.1 cm with max SUV 41.5, significantly decreased in size and metabolism. No additional pathologically enlarged or hypermetabolic lymph nodes in the abdomen or pelvis. Previously described hypermetabolic left pelvic adenopathy has resolved. No abnormal hypermetabolic activity within the liver, pancreas, adrenal glands, or spleen. Moderate hiatal hernia. Stable diffuse bladder wall thickening. Bladder is nearly collapsed. Stable moderately enlarged prostate with nonspecific internal prostatic calcifications. Atherosclerotic nonaneurysmal abdominal aorta. SKELETON There is mild diffuse marrow hypermetabolism throughout the axial skeleton, consistent with reactive marrow state given ongoing chemotherapy. There is residual hypermetabolism (max SUV 6.3) at the site of the pathologic fracture associated with the mixed lytic and sclerotic left sacral lesion, which is increased in sclerosis on the CT images and decreased significantly in metabolism (previous max SUV 31.1). No residual skeletal hypermetabolism at the previously visualized sites in the medial left iliac bone and left superior acetabulum. No residual soft tissue component in the left posterior ninth rib lesion. Mild residual hypermetabolism at the site of a pathologic fracture in the left posterior ninth rib (max SUV 4.3), significantly decreased in metabolism (previous max SUV 40.6). No residual soft tissue component or residual hypermetabolism at the left anterior third rib bone lesion. No residual hypermetabolism in the right anterior seventh rib or right proximal femoral lesions. No new hypermetabolic skeletal lesions. IMPRESSION: 1. Significant  interval metabolic response. 2. Previously described hypermetabolic high left mediastinal and left hilar nodal disease and right upper lobe pulmonary nodule have resolved. 3. Hypermetabolic conglomerate mediastinal adenopathy is significantly decreased in size and metabolism. Previously described left pelvic hypermetabolic adenopathy has resolved. 4. Previously described hypermetabolic skeletal lesions in the right anterior seventh rib, left anterior third rib, right proximal femur and left iliac bone have resolved. Residual hypermetabolism at the sites of pathologic fractures in the left sacral and left posterior ninth rib lesions, significantly decreased in metabolism, with resolution of the soft tissue component in the left posterior ninth  rib lesion. 5. No new or progressive hypermetabolic sites of disease. 6. Solitary right middle lobe 5 mm pulmonary nodule is stable and below PET resolution, probably benign. 7. Mild diffuse marrow hypermetabolism throughout the axial skeleton, consistent with reactive marrow state given ongoing chemotherapy. 8. Additional findings include aortic atherosclerosis, three-vessel coronary atherosclerosis, mild centrilobular emphysema, stable diffuse bladder wall thickening with moderately enlarged prostate, and moderate hiatal hernia. Electronically Signed   By: Ilona Sorrel M.D.   On: 03/02/2016 12:26   Dg Fluoro Guided Loc Of Needle/cath Tip For Spinal Inject Rt  Result Date: 03/08/2016 CLINICAL DATA:  65 year old male presents for his third intrathecal chemotherapy injection. Burkitt lymphoma. Subsequent encounter. EXAM: FLUOROSCOPICALLY GUIDED LUMBAR PUNCTURE FOR INTRATHECAL CHEMOTHERAPY TECHNIQUE: Informed consent was obtained from the patient prior to the procedure, including potential complications of headache, allergy, and pain. A 'time out' was performed. With the patient prone, the lower back was prepped with Betadine. 1% Lidocaine was used for local anesthesia.  Lumbar puncture was performed at the L2-L3 level using left sub laminar technique, and a 3.5 inch x 20 gauge needle with return of clear CSF. The chemotherapy mixture sent from pharmacy labeled with the patient's name and date of birth was slowly injected into the subarachnoid space. The patient tolerated the procedure well without apparent complication. Appropriate postprocedural orders were placed in Epic and he was returned to the inpatient ward for further treatment. FLUOROSCOPY TIME:  0 minutes 12 seconds IMPRESSION: Intrathecal injection of chemotherapy at Q7-Y1 without complication. Electronically Signed   By: Genevie Ann M.D.   On: 03/08/2016 14:14   Dg Fluoro Guided Loc Of Needle/cath Tip For Spinal Inject Lt  Result Date: 03/30/2016 CLINICAL DATA:  Burkitt's lymphoma. Intrathecal methotrexate injection. EXAM: FLUOROSCOPICALLY GUIDED LUMBAR PUNCTURE FOR INTRATHECAL CHEMOTHERAPY TECHNIQUE: Informed consent was obtained from the patient prior to the procedure, including potential complications of headache, allergy, and pain. A 'time out' was performed. With the patient prone, the lower back was prepped with Betadine. 1% Lidocaine was used for local anesthesia. Lumbar puncture was performed at the L3-4 using a 20 gauge needle with return of clear CSF. 10 mL of methotrexate was injected into the subarachnoid space. The patient tolerated the procedure well without apparent complication. FLUOROSCOPY TIME:  24 seconds IMPRESSION: Intrathecal injection of chemotherapy without complication Electronically Signed   By: Rolm Baptise M.D.   On: 03/30/2016 12:55    ASSESSMENT & PLAN:  1) Stage IVB Burkitt's lymphoma with t(8;14) translocation diagnosed 12/23/2015. This appears to be involving the sacrum causing sacral and root involvement with possible neurogenic bladder. PET CT scan as noted above shows extensive involvement with lymphoma. CSF negative for involvement. Bone marrow biopsy negative for involvement  by lymphoma.   CURRENT TREATMENT The patient has completed 4cycles of EPOCH -R and 2 IT methotrexate doses  Planned treatment 6 cycles of EPOCH-R  IT methotrexate x 4 doses starting with cycle 3 for CNS prophylaxis  PLAN -he is labs are stable. His diarrhea has resolved. -His appropriate to proceed with his fifth cycle of EPOCH-R starting today. -He will received his 3rd dose of intrathecal methotrexate today by IR. Burnis Medin continue the same supportive medications. -Rituxan on day 5 -Outpatient Neulasta on day 7(monday) . -We'll continue to monitor daily CBC and CMP. -will rpt LDH which was elevated recently in the setting of infection  2) C diff diarrhea -Will complete 14 days of treatment with oral vancomycin after which we shall back off on the dose  to preventive doses while on active chemotherapy. -Would avoid probiotics while on chemotherapy.  3)Recurrent UTI -Given his recurrent UTI will place on prophylactic Bactrim on discharge.  3) DVT prophylaxis  - TEDS and SCD . We'll start Lovenox 24 hours after lumbar puncture .  4) HTN -continue outpatient medications   5) PAD - has been on outpatient aspirin and Plavix . Was held for his planned lumbar puncture . We'll restart aspirin tomorrow if no issues with LP and Plavix thereafter .  All of the patients questions were answered with apparent satisfaction. The patient knows to call the clinic with any problems, questions or concerns.  I spent 45 minutes counseling the patient face to face. The total time spent in the appointment was 60 minutes and more than 50% was on counseling and direct patient cares.    Sullivan Lone MD [place] AAHIVMS [facility] Hematology/Oncology Physician [facility]  (Office):       [phone] (Work cell):  [phone] (Fax):           [phone]

## 2016-03-31 DIAGNOSIS — A0472 Enterocolitis due to Clostridium difficile, not specified as recurrent: Secondary | ICD-10-CM

## 2016-03-31 LAB — CBC
HEMATOCRIT: 32.7 % — AB (ref 39.0–52.0)
Hemoglobin: 10.6 g/dL — ABNORMAL LOW (ref 13.0–17.0)
MCH: 27 pg (ref 26.0–34.0)
MCHC: 32.4 g/dL (ref 30.0–36.0)
MCV: 83.4 fL (ref 78.0–100.0)
PLATELETS: 446 10*3/uL — AB (ref 150–400)
RBC: 3.92 MIL/uL — ABNORMAL LOW (ref 4.22–5.81)
RDW: 19.9 % — AB (ref 11.5–15.5)
WBC: 11.9 10*3/uL — AB (ref 4.0–10.5)

## 2016-03-31 LAB — BASIC METABOLIC PANEL
Anion gap: 8 (ref 5–15)
BUN: 15 mg/dL (ref 6–20)
CALCIUM: 9.1 mg/dL (ref 8.9–10.3)
CO2: 22 mmol/L (ref 22–32)
CREATININE: 0.69 mg/dL (ref 0.61–1.24)
Chloride: 103 mmol/L (ref 101–111)
GFR calc Af Amer: >60 mL/min (ref >60)
GLUCOSE: 260 mg/dL — AB (ref 65–99)
Potassium: 4.3 mmol/L (ref 3.5–5.1)
SODIUM: 133 mmol/L — AB (ref 135–145)

## 2016-03-31 MED ORDER — ENOXAPARIN SODIUM 30 MG/0.3ML ~~LOC~~ SOLN
30.0000 mg | Freq: Every evening | SUBCUTANEOUS | Status: DC
  Administered 2016-03-31 – 2016-04-02 (×3): 30 mg via SUBCUTANEOUS
  Filled 2016-03-31 (×3): qty 0.3

## 2016-03-31 MED ORDER — ASPIRIN EC 81 MG PO TBEC
81.0000 mg | DELAYED_RELEASE_TABLET | Freq: Every evening | ORAL | Status: DC
  Administered 2016-03-31 – 2016-04-02 (×3): 81 mg via ORAL
  Filled 2016-03-31 (×3): qty 1

## 2016-03-31 MED ORDER — VINCRISTINE SULFATE CHEMO INJECTION 1 MG/ML
Freq: Once | INTRAVENOUS | Status: AC
  Administered 2016-03-31: 12:00:00 via INTRAVENOUS
  Filled 2016-03-31: qty 10

## 2016-03-31 MED ORDER — SODIUM CHLORIDE 0.9 % IV SOLN
Freq: Once | INTRAVENOUS | Status: AC
  Administered 2016-03-31: 18 mg via INTRAVENOUS
  Filled 2016-03-31: qty 4

## 2016-03-31 NOTE — Progress Notes (Signed)
Chemo dosages and calculations verified with Laural Benes RN

## 2016-03-31 NOTE — Progress Notes (Signed)
Inpatient Diabetes Program Recommendations  AACE/ADA: New Consensus Statement on Inpatient Glycemic Control (2015)  Target Ranges:  Prepandial:   less than 140 mg/dL      Peak postprandial:   less than 180 mg/dL (1-2 hours)      Critically ill patients:  140 - 180 mg/dL   Lab Results  Component Value Date   GLUCAP 129 (H) 03/02/2016    Review of Glycemic Control  Steroid-induced hyperglycemia. Needs correction insulin.  Inpatient Diabetes Program Recommendations:    Consider addition of Novoog moderate tidwc and hs while on steroids. If blood sugars > 180 mg/dL, may need meal coverage insulin while on steroids.  Will continue to follow. Thank you. Lorenda Peck, RD, LDN, CDE Inpatient Diabetes Coordinator [phone]

## 2016-03-31 NOTE — Progress Notes (Signed)
Erik Vaughn   HEMATOLOGY/ONCOLOGY INPATIENT PROGRESS NOTE  Date of Service: 03/31/2016  Inpatient Attending: .Brunetta Genera, MD   SUBJECTIVE  Patient receiving cycle 5 date 2 EPOCH-R chemotherapy today. No acute symptoms overnight or during the day today. Labs are relatively stable. No hypersensitivity reactions. No diarrhea and currently has formed stools. Eating well and try to keep well-hydrated. In good spirits overall.   OBJECTIVE:  NAD  PHYSICAL EXAMINATION: . Vitals:   03/30/16 2127 03/31/16 0512 03/31/16 1500 03/31/16 2107  BP: 121/63 135/81 116/68 129/69  Pulse: (!) 101 (!) 104 (!) 103 93  Resp: 18 18 18 18   Temp: 98.7 F (37.1 C) 98.3 F (36.8 C) 98.3 F (36.8 C) 97.8 F (36.6 C)  TempSrc: Oral Oral Oral Oral  SpO2: 96% 98% 98% 96%  Weight:      Height:       Filed Weights   03/30/16 0958  Weight: 176 lb 3.2 oz (79.9 kg)   .Body mass index is 23.9 kg/m.  GENERAL:alert, in no acute distress and comfortable SKIN: skin color, texture, turgor are normal, no rashes or significant lesions EYES: normal, conjunctiva are pink and non-injected, sclera clear OROPHARYNX:no exudate, no erythema and lips, buccal mucosa, and tongue normal  NECK: supple, no JVD, thyroid normal size, non-tender, without nodularity LYMPH:  no palpable lymphadenopathy in the cervical, axillary or inguinal LUNGS: clear to auscultation with normal respiratory effort HEART: regular rate & rhythm,  no murmurs and no lower extremity edema ABDOMEN: abdomen soft, non-tender, normoactive bowel sounds  Musculoskeletal: no cyanosis of digits and no clubbing  PSYCH: alert & oriented x 3 with fluent speech NEURO: no focal motor/sensory deficits  MEDICAL HISTORY:  Past Medical History:  Diagnosis Date  . Cancer ([place])    Burkett Lymphoma  . Hypertension   . PAD (peripheral artery disease) (HCC) left leg    SURGICAL HISTORY: Past Surgical History:  Procedure Laterality Date  . IR GENERIC  HISTORICAL  12/31/2015   IR FLUORO GUIDE CV LINE RIGHT 12/31/2015 Arne Cleveland, MD [facility]  . IR GENERIC HISTORICAL  12/31/2015   IR US GUIDE VASC ACCESS RIGHT 12/31/2015 Arne Cleveland, MD [facility]  . left salivary Left    removed    SOCIAL HISTORY: Social History   Social History  . Marital status: Married    Spouse name: N/A  . Number of children: N/A  . Years of education: N/A   Occupational History  . meter specialist    Social History Main Topics  . Smoking status: Former Smoker    Packs/day: 2.00    Years: 16.00    Quit date: 12/22/2005  . Smokeless tobacco: Never Used  . Alcohol use Yes     Comment: occasionally  . Drug use: No  . Sexual activity: No   Other Topics Concern  . Not on file   Social History Narrative  . No narrative on file    FAMILY HISTORY: Family History  Problem Relation Age of Onset  . Alzheimer's disease Father 100    ALLERGIES:  has No Known Allergies.  MEDICATIONS:  Scheduled Meds: . amLODipine  10 mg Oral q1800  . aspirin EC  81 mg Oral QPM  . DOXOrubicin/vinCRIStine/etoposide CHEMO IV infusion for Inpatient CI   Intravenous Once  . enoxaparin (LOVENOX) injection  30 mg Subcutaneous QPM  . lisinopril  20 mg Oral q1800  . mirtazapine  15 mg Oral QHS  . predniSONE  60 mg/m2/day (Treatment Plan Recorded) Oral  QAC breakfast  . vancomycin  125 mg Oral Q6H   Continuous Infusions: . sodium chloride 10 mL/hr at 03/30/16 1258   PRN Meds:.alteplase, heparin lock flush, heparin lock flush, LORazepam, magic mouthwash w/lidocaine, oxyCODONE, senna-docusate, sodium chloride flush, sodium chloride flush  REVIEW OF SYSTEMS:    10 Point review of Systems was done is negative except as noted above.   LABORATORY DATA:  I have reviewed the data as listed  . CBC Latest Ref Rng & Units 03/31/2016 03/30/2016 03/23/2016  WBC 4.0 - 10.5 K/uL 11.9(H) 10.1 11.4(H)  Hemoglobin 13.0 - 17.0 g/dL 10.6(L) 11.2(L) 9.6(L)  Hematocrit 39.0 -  52.0 % 32.7(L) 35.0(L) 30.3(L)  Platelets 150 - 400 K/uL 446(H) 426(H) 194    . CMP Latest Ref Rng & Units 03/31/2016 03/30/2016 03/23/2016  Glucose 65 - 99 mg/dL 260(H) 108(H) 193(H)  BUN 6 - 20 mg/dL 15 17 8.5  Creatinine 0.61 - 1.24 mg/dL 0.69 0.80 0.6(L)  Sodium 135 - 145 mmol/L 133(L) 136 140  Potassium 3.5 - 5.1 mmol/L 4.3 4.6 3.4(L)  Chloride 101 - 111 mmol/L 103 99(L) -  CO2 22 - 32 mmol/L 22 29 26   Calcium 8.9 - 10.3 mg/dL 9.1 9.6 8.5  Total Protein 6.5 - 8.1 g/dL - 6.8 5.5(L)  Total Bilirubin 0.3 - 1.2 mg/dL - 0.6 <0.22  Alkaline Phos 38 - 126 U/L - 72 89  AST 15 - 41 U/L - 24 28  ALT 17 - 63 U/L - 26 19     RADIOGRAPHIC STUDIES: I have personally reviewed the radiological images as listed and agreed with the findings in the report. Nm Pet Image Restag (ps) Skull Base To Thigh  Result Date: 03/02/2016 CLINICAL DATA:  Subsequent treatment strategy for Burkitt's lymphoma status post 3 cycles chemotherapy. EXAM: NUCLEAR MEDICINE PET SKULL BASE TO THIGH TECHNIQUE: 9.2 mCi F-18 FDG was injected intravenously. Full-ring PET imaging was performed from the skull base to thigh after the radiotracer. CT data was obtained and used for attenuation correction and anatomic localization. FASTING BLOOD GLUCOSE:  Value: 129 mg/dl COMPARISON:  12/25/2015 PET-CT. FINDINGS: NECK No hypermetabolic lymph nodes in the neck. CHEST No hypermetabolic or enlarged axillary, mediastinal or hilar nodes. Previously described hypermetabolic high left mediastinal node has resolved. Previously visualized left hilar hypermetabolic node has resolved. Mild centrilobular emphysema. Solid 5 mm right middle lobe pulmonary nodule (series 8/image 35) is stable and below PET resolution. No acute consolidative airspace disease or additional significant pulmonary nodules. Previously described hypermetabolic 1.3 cm central right upper lobe pulmonary nodule has resolved. Right internal jugular MediPort terminates near the  cavoatrial junction. Left anterior descending, left circumflex and right coronary atherosclerosis. Atherosclerotic nonaneurysmal thoracic aorta. No pleural effusions. ABDOMEN/PELVIS Hypermetabolic irregular 5.7 x 2.7 cm central mesenteric nodal conglomerate (series 4/image 151) with max SUV 4.9, previously 10.6 x 7.1 cm with max SUV 41.5, significantly decreased in size and metabolism. No additional pathologically enlarged or hypermetabolic lymph nodes in the abdomen or pelvis. Previously described hypermetabolic left pelvic adenopathy has resolved. No abnormal hypermetabolic activity within the liver, pancreas, adrenal glands, or spleen. Moderate hiatal hernia. Stable diffuse bladder wall thickening. Bladder is nearly collapsed. Stable moderately enlarged prostate with nonspecific internal prostatic calcifications. Atherosclerotic nonaneurysmal abdominal aorta. SKELETON There is mild diffuse marrow hypermetabolism throughout the axial skeleton, consistent with reactive marrow state given ongoing chemotherapy. There is residual hypermetabolism (max SUV 6.3) at the site of the pathologic fracture associated with the mixed lytic and sclerotic left sacral lesion, which  is increased in sclerosis on the CT images and decreased significantly in metabolism (previous max SUV 31.1). No residual skeletal hypermetabolism at the previously visualized sites in the medial left iliac bone and left superior acetabulum. No residual soft tissue component in the left posterior ninth rib lesion. Mild residual hypermetabolism at the site of a pathologic fracture in the left posterior ninth rib (max SUV 4.3), significantly decreased in metabolism (previous max SUV 40.6). No residual soft tissue component or residual hypermetabolism at the left anterior third rib bone lesion. No residual hypermetabolism in the right anterior seventh rib or right proximal femoral lesions. No new hypermetabolic skeletal lesions. IMPRESSION: 1. Significant  interval metabolic response. 2. Previously described hypermetabolic high left mediastinal and left hilar nodal disease and right upper lobe pulmonary nodule have resolved. 3. Hypermetabolic conglomerate mediastinal adenopathy is significantly decreased in size and metabolism. Previously described left pelvic hypermetabolic adenopathy has resolved. 4. Previously described hypermetabolic skeletal lesions in the right anterior seventh rib, left anterior third rib, right proximal femur and left iliac bone have resolved. Residual hypermetabolism at the sites of pathologic fractures in the left sacral and left posterior ninth rib lesions, significantly decreased in metabolism, with resolution of the soft tissue component in the left posterior ninth rib lesion. 5. No new or progressive hypermetabolic sites of disease. 6. Solitary right middle lobe 5 mm pulmonary nodule is stable and below PET resolution, probably benign. 7. Mild diffuse marrow hypermetabolism throughout the axial skeleton, consistent with reactive marrow state given ongoing chemotherapy. 8. Additional findings include aortic atherosclerosis, three-vessel coronary atherosclerosis, mild centrilobular emphysema, stable diffuse bladder wall thickening with moderately enlarged prostate, and moderate hiatal hernia. Electronically Signed   By: Ilona Sorrel M.D.   On: 03/02/2016 12:26   Dg Fluoro Guided Loc Of Needle/cath Tip For Spinal Inject Rt  Result Date: 03/08/2016 CLINICAL DATA:  65 year old male presents for his third intrathecal chemotherapy injection. Burkitt lymphoma. Subsequent encounter. EXAM: FLUOROSCOPICALLY GUIDED LUMBAR PUNCTURE FOR INTRATHECAL CHEMOTHERAPY TECHNIQUE: Informed consent was obtained from the patient prior to the procedure, including potential complications of headache, allergy, and pain. A 'time out' was performed. With the patient prone, the lower back was prepped with Betadine. 1% Lidocaine was used for local anesthesia.  Lumbar puncture was performed at the L2-L3 level using left sub laminar technique, and a 3.5 inch x 20 gauge needle with return of clear CSF. The chemotherapy mixture sent from pharmacy labeled with the patient's name and date of birth was slowly injected into the subarachnoid space. The patient tolerated the procedure well without apparent complication. Appropriate postprocedural orders were placed in Epic and he was returned to the inpatient ward for further treatment. FLUOROSCOPY TIME:  0 minutes 12 seconds IMPRESSION: Intrathecal injection of chemotherapy at D7-A1 without complication. Electronically Signed   By: Genevie Ann M.D.   On: 03/08/2016 14:14   Dg Fluoro Guided Loc Of Needle/cath Tip For Spinal Inject Lt  Result Date: 03/30/2016 CLINICAL DATA:  Burkitt's lymphoma. Intrathecal methotrexate injection. EXAM: FLUOROSCOPICALLY GUIDED LUMBAR PUNCTURE FOR INTRATHECAL CHEMOTHERAPY TECHNIQUE: Informed consent was obtained from the patient prior to the procedure, including potential complications of headache, allergy, and pain. A 'time out' was performed. With the patient prone, the lower back was prepped with Betadine. 1% Lidocaine was used for local anesthesia. Lumbar puncture was performed at the L3-4 using a 20 gauge needle with return of clear CSF. 10 mL of methotrexate was injected into the subarachnoid space. The patient tolerated the procedure well without  apparent complication. FLUOROSCOPY TIME:  24 seconds IMPRESSION: Intrathecal injection of chemotherapy without complication Electronically Signed   By: Rolm Baptise M.D.   On: 03/30/2016 12:55    ASSESSMENT & PLAN:   1) Stage IVB Burkitt's lymphoma with t(8;14) translocation diagnosed 12/23/2015. This appears to be involving the sacrum causing sacral and root involvement with possible neurogenic bladder. PET CT scan as noted above shows extensive involvement with lymphoma. CSF negative for involvement. Bone marrow biopsy negative for  involvement by lymphoma.   CURRENT TREATMENT The patient has completed 4cycles of EPOCH -R and 2 IT methotrexate doses  Planned treatment 6 cycles of EPOCH-R  IT methotrexate x 4 doses starting with cycle 3 for CNS prophylaxis  PLAN -he is labs are stable.  -receiving C5D2 of EPOCH-R today. -received his 3rd dose of intrathecal methotrexate yesterday by IR. No back pain no spinal headaches -We'll continue the same supportive medications. -Rituxan on day 5 -Outpatient Neulasta on day 7(monday) . -We'll continue to monitor daily CBC and CMP. -will rpt LDH which was elevated recently in the setting of infection  2) C diff diarrhea -Will complete 14 days of treatment with oral vancomycin after which we shall back off on the dose to preventive doses while on active chemotherapy. -Would avoid probiotics while on chemotherapy.  3)Recurrent UTI -Given his recurrent UTI will place on prophylactic Bactrim on discharge.  3) DVTprophylaxis - TEDS and SCD . We'll start Lovenox 24 hours after lumbar puncture .  4) HTN -continue outpatient medications   5) PAD - has been on outpatient aspirin and Plavix . Was held for his planned lumbar puncture .  ASA restarted today alongwith low dose lovenox for VTE prophylaxis Plavix restart tomorrow if no issues with bleeding.   I spent 30 minutes counseling the patient face to face. The total time spent in the appointment was 35 minutes and more than 50% was on counseling and direct patient cares.    Sullivan Lone MD [place] AAHIVMS [facility] Hematology/Oncology Physician [facility]  (Office):       [phone] (Work cell):  [phone] (Fax):           [phone]  03/31/2016 11:36 PM

## 2016-04-01 LAB — BASIC METABOLIC PANEL
ANION GAP: 10 (ref 5–15)
BUN: 15 mg/dL (ref 6–20)
CALCIUM: 9.4 mg/dL (ref 8.9–10.3)
CO2: 25 mmol/L (ref 22–32)
Chloride: 105 mmol/L (ref 101–111)
Creatinine, Ser: 0.58 mg/dL — ABNORMAL LOW (ref 0.61–1.24)
GLUCOSE: 157 mg/dL — AB (ref 65–99)
POTASSIUM: 4.1 mmol/L (ref 3.5–5.1)
SODIUM: 140 mmol/L (ref 135–145)

## 2016-04-01 LAB — CBC
HEMATOCRIT: 31 % — AB (ref 39.0–52.0)
HEMOGLOBIN: 9.9 g/dL — AB (ref 13.0–17.0)
MCH: 26.8 pg (ref 26.0–34.0)
MCHC: 31.9 g/dL (ref 30.0–36.0)
MCV: 84 fL (ref 78.0–100.0)
Platelets: 403 10*3/uL — ABNORMAL HIGH (ref 150–400)
RBC: 3.69 MIL/uL — AB (ref 4.22–5.81)
RDW: 20.5 % — ABNORMAL HIGH (ref 11.5–15.5)
WBC: 12.5 10*3/uL — AB (ref 4.0–10.5)

## 2016-04-01 LAB — LACTATE DEHYDROGENASE: LDH: 213 U/L — AB (ref 98–192)

## 2016-04-01 MED ORDER — SODIUM CHLORIDE 0.9 % IV SOLN
Freq: Once | INTRAVENOUS | Status: AC
  Administered 2016-04-01: 8 mg via INTRAVENOUS
  Filled 2016-04-01: qty 4

## 2016-04-01 MED ORDER — CLOPIDOGREL BISULFATE 75 MG PO TABS
75.0000 mg | ORAL_TABLET | Freq: Every evening | ORAL | Status: DC
  Administered 2016-04-01 – 2016-04-02 (×2): 75 mg via ORAL
  Filled 2016-04-01 (×2): qty 1

## 2016-04-01 MED ORDER — VINCRISTINE SULFATE CHEMO INJECTION 1 MG/ML
Freq: Once | INTRAVENOUS | Status: AC
  Administered 2016-04-01: 11:00:00 via INTRAVENOUS
  Filled 2016-04-01: qty 10

## 2016-04-01 NOTE — Care Management Note (Signed)
Case Management Note  Patient Details  Name: Erik Vaughn MRN: [id number] Date of Birth: 04/05/1951  Subjective/Objective:     65 yo admitted with Stage IVB Burkitt's lymphoma for chemotherapy.               Action/Plan: From home with spouse. Chart reviewed and CM following for DC needs.  Expected Discharge Date:  04/03/16               Expected Discharge Plan:  Home/Self Care  In-House Referral:     Discharge planning Services  CM Consult  Post Acute Care Choice:    Choice offered to:     DME Arranged:    DME Agency:     HH Arranged:    HH Agency:     Status of Service:  In process, will continue to follow  If discussed at Long Length of Stay Meetings, dates discussed:    Additional CommentsLynnell Catalan, RN 04/01/2016, 11:58 AM  [phone]

## 2016-04-01 NOTE — Progress Notes (Signed)
Dosing and dilution verified for Doxorubicin, Etoposide and Oncovin with 2nd chemo competent RN Clotilde Dieter.

## 2016-04-01 NOTE — Progress Notes (Signed)
[name]   HEMATOLOGY/ONCOLOGY INPATIENT PROGRESS NOTE  Date of Service: 04/01/2016  Inpatient Attending: .Brunetta Genera, MD   SUBJECTIVE  Patient receiving cycle 5 day 3 EPOCH-R chemotherapy today. No acute symptoms overnight or during the day today. Labs are stable. No hypersensitivity reactions. No diarrhea and currently- has formed stools. Eating well and try to keep well-hydrated. In good spirits overall. Ambulating frequently as recommended. No fevers. eager to go home on Saturday.   OBJECTIVE:  NAD  PHYSICAL EXAMINATION: . Vitals:   03/31/16 1500 03/31/16 2107 04/01/16 0457 04/01/16 1500  BP: 116/68 129/69 137/71 129/69  Pulse: (!) 103 93 82 92  Resp: 18 18 16 18   Temp: 98.3 F (36.8 C) 97.8 F (36.6 C) 97.9 F (36.6 C) 98.6 F (37 C)  TempSrc: Oral Oral Oral Oral  SpO2: 98% 96% 98% 98%  Weight:      Height:       Filed Weights   03/30/16 0958  Weight: 176 lb 3.2 oz (79.9 kg)   .Body mass index is 23.9 kg/m.  GENERAL:alert, in no acute distress and comfortable SKIN: skin color, texture, turgor are normal, no rashes or significant lesions EYES: normal, conjunctiva are pink and non-injected, sclera clear OROPHARYNX:no exudate, no erythema and lips, buccal mucosa, and tongue normal  NECK: supple, no JVD, thyroid normal size, non-tender, without nodularity LYMPH:  no palpable lymphadenopathy in the cervical, axillary or inguinal LUNGS: clear to auscultation with normal respiratory effort HEART: regular rate & rhythm,  no murmurs and no lower extremity edema ABDOMEN: abdomen soft, non-tender, normoactive bowel sounds  Musculoskeletal: no cyanosis of digits and no clubbing  PSYCH: alert & oriented x 3 with fluent speech NEURO: no focal motor/sensory deficits  MEDICAL HISTORY:  Past Medical History:  Diagnosis Date  . Cancer ([place])    Burkett Lymphoma  . Hypertension   . PAD (peripheral artery disease) (HCC) left leg    SURGICAL HISTORY: Past Surgical  History:  Procedure Laterality Date  . IR GENERIC HISTORICAL  12/31/2015   IR FLUORO GUIDE CV LINE RIGHT 12/31/2015 Arne Cleveland, MD [facility]  . IR GENERIC HISTORICAL  12/31/2015   IR US GUIDE VASC ACCESS RIGHT 12/31/2015 Arne Cleveland, MD [facility]  . left salivary Left    removed    SOCIAL HISTORY: Social History   Social History  . Marital status: Married    Spouse name: N/A  . Number of children: N/A  . Years of education: N/A   Occupational History  . meter specialist    Social History Main Topics  . Smoking status: Former Smoker    Packs/day: 2.00    Years: 16.00    Quit date: 12/22/2005  . Smokeless tobacco: Never Used  . Alcohol use Yes     Comment: occasionally  . Drug use: No  . Sexual activity: No   Other Topics Concern  . Not on file   Social History Narrative  . No narrative on file    FAMILY HISTORY: Family History  Problem Relation Age of Onset  . Alzheimer's disease Father 34    ALLERGIES:  has No Known Allergies.  MEDICATIONS:  Scheduled Meds: . amLODipine  10 mg Oral q1800  . aspirin EC  81 mg Oral QPM  . DOXOrubicin/vinCRIStine/etoposide CHEMO IV infusion for Inpatient CI   Intravenous Once  . enoxaparin (LOVENOX) injection  30 mg Subcutaneous QPM  . lisinopril  20 mg Oral q1800  . mirtazapine  15 mg Oral QHS  .  predniSONE  60 mg/m2/day (Treatment Plan Recorded) Oral QAC breakfast  . vancomycin  125 mg Oral Q6H   Continuous Infusions: . sodium chloride 10 mL/hr at 03/30/16 1258   PRN Meds:.alteplase, heparin lock flush, heparin lock flush, LORazepam, magic mouthwash w/lidocaine, oxyCODONE, senna-docusate, sodium chloride flush, sodium chloride flush  REVIEW OF SYSTEMS:    10 Point review of Systems was done is negative except as noted above.   LABORATORY DATA:  I have reviewed the data as listed  . CBC Latest Ref Rng & Units 04/01/2016 03/31/2016 03/30/2016  WBC 4.0 - 10.5 K/uL 12.5(H) 11.9(H) 10.1  Hemoglobin 13.0 - 17.0  g/dL 9.9(L) 10.6(L) 11.2(L)  Hematocrit 39.0 - 52.0 % 31.0(L) 32.7(L) 35.0(L)  Platelets 150 - 400 K/uL 403(H) 446(H) 426(H)    . CMP Latest Ref Rng & Units 04/01/2016 03/31/2016 03/30/2016  Glucose 65 - 99 mg/dL 157(H) 260(H) 108(H)  BUN 6 - 20 mg/dL 15 15 17   Creatinine 0.61 - 1.24 mg/dL 0.58(L) 0.69 0.80  Sodium 135 - 145 mmol/L 140 133(L) 136  Potassium 3.5 - 5.1 mmol/L 4.1 4.3 4.6  Chloride 101 - 111 mmol/L 105 103 99(L)  CO2 22 - 32 mmol/L 25 22 29   Calcium 8.9 - 10.3 mg/dL 9.4 9.1 9.6  Total Protein 6.5 - 8.1 g/dL - - 6.8  Total Bilirubin 0.3 - 1.2 mg/dL - - 0.6  Alkaline Phos 38 - 126 U/L - - 72  AST 15 - 41 U/L - - 24  ALT 17 - 63 U/L - - 26     RADIOGRAPHIC STUDIES: I have personally reviewed the radiological images as listed and agreed with the findings in the report. Dg Fluoro Guided Loc Of Needle/cath Tip For Spinal Inject Rt  Result Date: 03/08/2016 CLINICAL DATA:  65 year old male presents for his third intrathecal chemotherapy injection. Burkitt lymphoma. Subsequent encounter. EXAM: FLUOROSCOPICALLY GUIDED LUMBAR PUNCTURE FOR INTRATHECAL CHEMOTHERAPY TECHNIQUE: Informed consent was obtained from the patient prior to the procedure, including potential complications of headache, allergy, and pain. A 'time out' was performed. With the patient prone, the lower back was prepped with Betadine. 1% Lidocaine was used for local anesthesia. Lumbar puncture was performed at the L2-L3 level using left sub laminar technique, and a 3.5 inch x 20 gauge needle with return of clear CSF. The chemotherapy mixture sent from pharmacy labeled with the patient's name and date of birth was slowly injected into the subarachnoid space. The patient tolerated the procedure well without apparent complication. Appropriate postprocedural orders were placed in Epic and he was returned to the inpatient ward for further treatment. FLUOROSCOPY TIME:  0 minutes 12 seconds IMPRESSION: Intrathecal injection of  chemotherapy at H4-L9 without complication. Electronically Signed   By: Genevie Ann M.D.   On: 03/08/2016 14:14   Dg Fluoro Guided Loc Of Needle/cath Tip For Spinal Inject Lt  Result Date: 03/30/2016 CLINICAL DATA:  Burkitt's lymphoma. Intrathecal methotrexate injection. EXAM: FLUOROSCOPICALLY GUIDED LUMBAR PUNCTURE FOR INTRATHECAL CHEMOTHERAPY TECHNIQUE: Informed consent was obtained from the patient prior to the procedure, including potential complications of headache, allergy, and pain. A 'time out' was performed. With the patient prone, the lower back was prepped with Betadine. 1% Lidocaine was used for local anesthesia. Lumbar puncture was performed at the L3-4 using a 20 gauge needle with return of clear CSF. 10 mL of methotrexate was injected into the subarachnoid space. The patient tolerated the procedure well without apparent complication. FLUOROSCOPY TIME:  24 seconds IMPRESSION: Intrathecal injection of chemotherapy without complication  Electronically Signed   By: Rolm Baptise M.D.   On: 03/30/2016 12:55    ASSESSMENT & PLAN:   1) Stage IVB Burkitt's lymphoma with t(8;14) translocation diagnosed 12/23/2015. This appears to be involving the sacrum causing sacral and root involvement with possible neurogenic bladder. PET CT scan as noted above shows extensive involvement with lymphoma. CSF negative for involvement. Bone marrow biopsy negative for involvement by lymphoma.   CURRENT TREATMENT The patient has completed 4cycles of EPOCH -R and 2 IT methotrexate doses  Planned treatment 6 cycles of EPOCH-R  IT methotrexate x 4 doses starting with cycle 3 for CNS prophylaxis  PLAN -his labs are stable. No new prohibitive toxicities. -receiving C5D3 of EPOCH-R today. -received his 3rd dose of intrathecal methotrexate D1 by IR. No back pain no spinal headaches -We'll continue the same supportive medications. -Rituxan on day 5 -Outpatient Neulasta on day 7(monday) . -We'll continue to  monitor daily CBC and CMP. -rpt LDH improved (resolving from recent infections) 2) C diff diarrhea -resolved -will switch to suppressive doses of PO vancomycin on discharge.  -Would avoid probiotics while on chemotherapy.  3)Recurrent UTI -Given his recurrent UTI will place on prophylactic Bactrim on discharge.  3) DVTprophylaxis - TEDS and SCD . We'll start Lovenox 24 hours after lumbar puncture .  4) HTN -continue outpatient medications   5) PAD - has been on outpatient aspirin and Plavix . Was held for his planned lumbar puncture .  ASA restarted today alongwith low dose lovenox for VTE prophylaxis Plavix restarted today.  I spent 20 minutes counseling the patient face to face. The total time spent in the appointment was 25 minutes and more than 50% was on counseling and direct patient cares.    Sullivan Lone MD [place] AAHIVMS [facility] Hematology/Oncology Physician [facility]  (Office):       [phone] (Work cell):  [phone] (Fax):           [phone]  04/01/2016 4:45 PM

## 2016-04-02 LAB — CBC
HEMATOCRIT: 33.2 % — AB (ref 39.0–52.0)
HEMOGLOBIN: 10.7 g/dL — AB (ref 13.0–17.0)
MCH: 27.3 pg (ref 26.0–34.0)
MCHC: 32.2 g/dL (ref 30.0–36.0)
MCV: 84.7 fL (ref 78.0–100.0)
Platelets: 409 10*3/uL — ABNORMAL HIGH (ref 150–400)
RBC: 3.92 MIL/uL — AB (ref 4.22–5.81)
RDW: 20.4 % — ABNORMAL HIGH (ref 11.5–15.5)
WBC: 11.3 10*3/uL — ABNORMAL HIGH (ref 4.0–10.5)

## 2016-04-02 LAB — BASIC METABOLIC PANEL
ANION GAP: 8 (ref 5–15)
BUN: 17 mg/dL (ref 6–20)
CHLORIDE: 104 mmol/L (ref 101–111)
CO2: 28 mmol/L (ref 22–32)
Calcium: 9.4 mg/dL (ref 8.9–10.3)
Creatinine, Ser: 0.55 mg/dL — ABNORMAL LOW (ref 0.61–1.24)
GFR calc non Af Amer: >60 mL/min (ref >60)
GLUCOSE: 120 mg/dL — AB (ref 65–99)
POTASSIUM: 3.9 mmol/L (ref 3.5–5.1)
Sodium: 140 mmol/L (ref 135–145)

## 2016-04-02 MED ORDER — ONDANSETRON HCL 40 MG/20ML IJ SOLN
Freq: Once | INTRAMUSCULAR | Status: AC
  Administered 2016-04-02: 8 mg via INTRAVENOUS
  Filled 2016-04-02: qty 4

## 2016-04-02 MED ORDER — VINCRISTINE SULFATE CHEMO INJECTION 1 MG/ML
Freq: Once | INTRAVENOUS | Status: AC
  Administered 2016-04-02: 10:00:00 via INTRAVENOUS
  Filled 2016-04-02: qty 10

## 2016-04-02 NOTE — Progress Notes (Cosign Needed)
Doxorubicin '20mg'$ , Etoposide '104mg'$ , Oncovin 0.8g in 682m NS  verified with second RN. All labs also reviewed.

## 2016-04-02 NOTE — Progress Notes (Signed)
[name]   HEMATOLOGY/ONCOLOGY INPATIENT PROGRESS NOTE  Date of Service: 04/02/2016  Inpatient Attending: .Brunetta Genera, MD   SUBJECTIVE  Patient receiving cycle 5 day 4 EPOCH-R chemotherapy today. Feels well and has no new acute concerns. Eating well. Trying to keep well hydrated. [name] to go home tomorrow.   OBJECTIVE:  NAD  PHYSICAL EXAMINATION: . Vitals:   04/01/16 2242 04/02/16 0643 04/02/16 1502 04/02/16 2037  BP: 138/71 (!) 144/75 132/65 132/77  Pulse: 82 77 93 96  Resp: _0 Temp: 97.4 F (36.3 C) 97.9 F (36.6 C) 97.1 F (36.2 C) 98.4 F (36.9 C)  TempSrc: Oral Oral Oral Oral  SpO2: 99% 100% 98% 97%  Weight:      Height:       Filed Weights   03/30/16 0958  Weight: 176 lb 3.2 oz (79.9 kg)   .Body mass index is 23.9 kg/m.  GENERAL:alert, in no acute distress and comfortable SKIN: skin color, texture, turgor are normal, no rashes or significant lesions EYES: normal, conjunctiva are pink and non-injected, sclera clear OROPHARYNX:no exudate, no erythema and lips, buccal mucosa, and tongue normal  NECK: supple, no JVD, thyroid normal size, non-tender, without nodularity LYMPH:  no palpable lymphadenopathy in the cervical, axillary or inguinal LUNGS: clear to auscultation with normal respiratory effort HEART: regular rate & rhythm,  no murmurs and no lower extremity edema ABDOMEN: abdomen soft, non-tender, normoactive bowel sounds  Musculoskeletal: no cyanosis of digits and no clubbing  PSYCH: alert & oriented x 3 with fluent speech NEURO: no focal motor/sensory deficits  MEDICAL HISTORY:  Past Medical History:  Diagnosis Date  . Cancer ([place])    Burkett Lymphoma  . Hypertension   . PAD (peripheral artery disease) ([facility]) left leg    SURGICAL HISTORY: Past Surgical History:  Procedure Laterality Date  . IR GENERIC HISTORICAL  12/31/2015   IR FLUORO GUIDE CV LINE RIGHT 12/31/2015 Arne Cleveland, MD [facility]  . IR GENERIC HISTORICAL  12/31/2015    IR US GUIDE VASC ACCESS RIGHT 12/31/2015 Arne Cleveland, MD [facility]  . left salivary Left    removed    SOCIAL HISTORY: Social History   Social History  . Marital status: Married    Spouse name: N/A  . Number of children: N/A  . Years of education: N/A   Occupational History  . meter specialist    Social History Main Topics  . Smoking status: Former Smoker    Packs/day: 2.00    Years: 16.00    Quit date: 12/22/2005  . Smokeless tobacco: Never Used  . Alcohol use Yes     Comment: occasionally  . Drug use: No  . Sexual activity: No   Other Topics Concern  . Not on file   Social History Narrative  . No narrative on file    FAMILY HISTORY: Family History  Problem Relation Age of Onset  . Alzheimer's disease Father 9    ALLERGIES:  has No Known Allergies.  MEDICATIONS:  Scheduled Meds: . amLODipine  10 mg Oral q1800  . aspirin EC  81 mg Oral QPM  . clopidogrel  75 mg Oral QPM  . DOXOrubicin/vinCRIStine/etoposide CHEMO IV infusion for Inpatient CI   Intravenous Once  . enoxaparin (LOVENOX) injection  30 mg Subcutaneous QPM  . lisinopril  20 mg Oral q1800  . mirtazapine  15 mg Oral QHS  . predniSONE  60 mg/m2/day (Treatment Plan Recorded) Oral QAC breakfast  . vancomycin  125 mg  Oral Q6H   Continuous Infusions: . sodium chloride 10 mL/hr at 03/30/16 1258   PRN Meds:.alteplase, heparin lock flush, heparin lock flush, LORazepam, magic mouthwash w/lidocaine, oxyCODONE, senna-docusate, sodium chloride flush, sodium chloride flush  REVIEW OF SYSTEMS:    10 Point review of Systems was done is negative except as noted above.   LABORATORY DATA:  I have reviewed the data as listed  . CBC Latest Ref Rng & Units 04/02/2016 04/01/2016 03/31/2016  WBC 4.0 - 10.5 K/uL 11.3(H) 12.5(H) 11.9(H)  Hemoglobin 13.0 - 17.0 g/dL 10.7(L) 9.9(L) 10.6(L)  Hematocrit 39.0 - 52.0 % 33.2(L) 31.0(L) 32.7(L)  Platelets 150 - 400 K/uL 409(H) 403(H) 446(H)    . CMP Latest Ref  Rng & Units 04/02/2016 04/01/2016 03/31/2016  Glucose 65 - 99 mg/dL 120(H) 157(H) 260(H)  BUN 6 - 20 mg/dL _0 Creatinine 0.61 - 1.24 mg/dL 0.55(L) 0.58(L) 0.69  Sodium 135 - 145 mmol/L 140 140 133(L)  Potassium 3.5 - 5.1 mmol/L 3.9 4.1 4.3  Chloride 101 - 111 mmol/L 104 105 103  CO2 22 - 32 mmol/L _1 Calcium 8.9 - 10.3 mg/dL 9.4 9.4 9.1  Total Protein 6.5 - 8.1 g/dL - - -  Total Bilirubin 0.3 - 1.2 mg/dL - - -  Alkaline Phos 38 - 126 U/L - - -  AST 15 - 41 U/L - - -  ALT 17 - 63 U/L - - -     RADIOGRAPHIC STUDIES: I have personally reviewed the radiological images as listed and agreed with the findings in the report. Dg Fluoro Guided Loc Of Needle/cath Tip For Spinal Inject Rt  Result Date: 03/08/2016 CLINICAL DATA:  65 year old male presents for his third intrathecal chemotherapy injection. Burkitt lymphoma. Subsequent encounter. EXAM: FLUOROSCOPICALLY GUIDED LUMBAR PUNCTURE FOR INTRATHECAL CHEMOTHERAPY TECHNIQUE: Informed consent was obtained from the patient prior to the procedure, including potential complications of headache, allergy, and pain. A 'time out' was performed. With the patient prone, the lower back was prepped with Betadine. 1% Lidocaine was used for local anesthesia. Lumbar puncture was performed at the L2-L3 level using left sub laminar technique, and a 3.5 inch x 20 gauge needle with return of clear CSF. The chemotherapy mixture sent from pharmacy labeled with the patient's name and date of birth was slowly injected into the subarachnoid space. The patient tolerated the procedure well without apparent complication. Appropriate postprocedural orders were placed in Epic and he was returned to the inpatient ward for further treatment. FLUOROSCOPY TIME:  0 minutes 12 seconds IMPRESSION: Intrathecal injection of chemotherapy at Q8-G5 without complication. Electronically Signed   By: Genevie Ann M.D.   On: 03/08/2016 14:14   Dg Fluoro Guided Loc Of Needle/cath Tip For  Spinal Inject Lt  Result Date: 03/30/2016 CLINICAL DATA:  Burkitt's lymphoma. Intrathecal methotrexate injection. EXAM: FLUOROSCOPICALLY GUIDED LUMBAR PUNCTURE FOR INTRATHECAL CHEMOTHERAPY TECHNIQUE: Informed consent was obtained from the patient prior to the procedure, including potential complications of headache, allergy, and pain. A 'time out' was performed. With the patient prone, the lower back was prepped with Betadine. 1% Lidocaine was used for local anesthesia. Lumbar puncture was performed at the L3-4 using a 20 gauge needle with return of clear CSF. 10 mL of methotrexate was injected into the subarachnoid space. The patient tolerated the procedure well without apparent complication. FLUOROSCOPY TIME:  24 seconds IMPRESSION: Intrathecal injection of chemotherapy without complication Electronically Signed   By: Rolm Baptise M.D.   On: 03/30/2016 12:55  ASSESSMENT & PLAN:   1) Stage IVB Burkitt's lymphoma with t(8;14) translocation diagnosed 12/23/2015. This appears to be involving the sacrum causing sacral and root involvement with possible neurogenic bladder. PET CT scan as noted above shows extensive involvement with lymphoma. CSF negative for involvement. Bone marrow biopsy negative for involvement by lymphoma.   CURRENT TREATMENT The patient has completed 4cycles of EPOCH -R and 2 IT methotrexate doses  Planned treatment 6 cycles of EPOCH-R  IT methotrexate x 4 doses starting with cycle 3 for CNS prophylaxis  PLAN -his labs are stable. No new prohibitive toxicities. -receiving C5D4 of EPOCH-R today. -received his 3rd dose of intrathecal methotrexate D1 by IR. No back pain no spinal headaches -We'll continue the same supportive medications. -Rituxan on day 5 -Outpatient Neulasta on day 7(monday) . -We'll continue to monitor daily CBC and CMP. -discharge tomorrow after completion of D5 chemotherapy and Rituxan 2) C diff diarrhea -resolved -will switch to suppressive  doses of PO vancomycin on discharge. -Would avoid probiotics while on chemotherapy.  3)Recurrent UTI -Given his recurrent UTI will place on prophylactic Bactrim on discharge.  3) DVTprophylaxis - TEDS and SCD . We'll start Lovenox 24 hours after lumbar puncture .  4) HTN -continue outpatient medications   5) PAD - has been on outpatient aspirin and Plavix . Was held for his planned lumbar puncture . Back on ASA and plavix  I spent 20 minutes counseling the patient face to face. The total time spent in the appointment was 25 minutes and more than 50% was on counseling and direct patient cares.    Sullivan Lone MD [place] AAHIVMS [facility] Hematology/Oncology Physician [facility]  (Office):       [phone] (Work cell):  [phone] (Fax):           [phone]

## 2016-04-03 ENCOUNTER — Other Ambulatory Visit: Payer: Self-pay | Admitting: Hematology

## 2016-04-03 LAB — BASIC METABOLIC PANEL
Anion gap: 11 (ref 5–15)
BUN: 18 mg/dL (ref 6–20)
CALCIUM: 9 mg/dL (ref 8.9–10.3)
CHLORIDE: 101 mmol/L (ref 101–111)
CO2: 24 mmol/L (ref 22–32)
CREATININE: 0.79 mg/dL (ref 0.61–1.24)
GFR calc non Af Amer: >60 mL/min (ref >60)
Glucose, Bld: 237 mg/dL — ABNORMAL HIGH (ref 65–99)
Potassium: 3.4 mmol/L — ABNORMAL LOW (ref 3.5–5.1)
Sodium: 136 mmol/L (ref 135–145)

## 2016-04-03 LAB — CBC
HCT: 30.7 % — ABNORMAL LOW (ref 39.0–52.0)
Hemoglobin: 10.1 g/dL — ABNORMAL LOW (ref 13.0–17.0)
MCH: 27.5 pg (ref 26.0–34.0)
MCHC: 32.9 g/dL (ref 30.0–36.0)
MCV: 83.7 fL (ref 78.0–100.0)
PLATELETS: 400 10*3/uL (ref 150–400)
RBC: 3.67 MIL/uL — AB (ref 4.22–5.81)
RDW: 20 % — AB (ref 11.5–15.5)
WBC: 7.5 10*3/uL (ref 4.0–10.5)

## 2016-04-03 MED ORDER — ASPIRIN 81 MG PO TBEC: 81.0000 mg | DELAYED_RELEASE_TABLET | Freq: Every evening | ORAL | Status: DC

## 2016-04-03 MED ORDER — FAMOTIDINE IN NACL 20-0.9 MG/50ML-% IV SOLN: 20.0000 mg | Freq: Once | INTRAVENOUS | Status: DC | PRN

## 2016-04-03 MED ORDER — EPINEPHRINE PF 1 MG/10ML IJ SOSY: 0.2500 mg | PREFILLED_SYRINGE | Freq: Once | INTRAMUSCULAR | Status: DC | PRN

## 2016-04-03 MED ORDER — SODIUM CHLORIDE 0.9 % IV SOLN
750.0000 mg/m2 | Freq: Once | INTRAVENOUS | Status: AC
  Administered 2016-04-03: 1560 mg via INTRAVENOUS
  Filled 2016-04-03: qty 78

## 2016-04-03 MED ORDER — SULFAMETHOXAZOLE-TRIMETHOPRIM 800-160 MG PO TABS: 1.0000 | ORAL_TABLET | Freq: Every day | ORAL | 1 refills | Status: DC

## 2016-04-03 MED ORDER — ALTEPLASE 2 MG IJ SOLR: 2.0000 mg | Freq: Once | INTRAMUSCULAR | Status: DC | PRN

## 2016-04-03 MED ORDER — METHYLPREDNISOLONE SODIUM SUCC 125 MG IJ SOLR: 125.0000 mg | Freq: Once | INTRAMUSCULAR | Status: DC | PRN

## 2016-04-03 MED ORDER — SODIUM CHLORIDE 0.9% FLUSH: 3.0000 mL | INTRAVENOUS | Status: DC | PRN

## 2016-04-03 MED ORDER — HEPARIN SOD (PORK) LOCK FLUSH 100 UNIT/ML IV SOLN: 250.0000 [IU] | Freq: Once | INTRAVENOUS | Status: DC | PRN

## 2016-04-03 MED ORDER — SODIUM CHLORIDE 0.9% FLUSH: 10.0000 mL | INTRAVENOUS | Status: DC | PRN

## 2016-04-03 MED ORDER — SODIUM CHLORIDE 0.9 % IV SOLN
Freq: Once | INTRAVENOUS | Status: AC
  Administered 2016-04-03: 36 mg via INTRAVENOUS
  Filled 2016-04-03: qty 8

## 2016-04-03 MED ORDER — SODIUM CHLORIDE 0.9 % IV SOLN: Freq: Once | INTRAVENOUS | Status: DC | PRN

## 2016-04-03 MED ORDER — DIPHENHYDRAMINE HCL 50 MG/ML IJ SOLN: 50.0000 mg | Freq: Once | INTRAMUSCULAR | Status: DC | PRN

## 2016-04-03 MED ORDER — SODIUM CHLORIDE 0.9 % IV SOLN
Freq: Once | INTRAVENOUS | Status: AC
  Administered 2016-04-03: 10:00:00 via INTRAVENOUS

## 2016-04-03 MED ORDER — EPINEPHRINE PF 1 MG/ML IJ SOLN: 0.5000 mg | Freq: Once | INTRAMUSCULAR | Status: DC | PRN

## 2016-04-03 MED ORDER — HEPARIN SOD (PORK) LOCK FLUSH 100 UNIT/ML IV SOLN
500.0000 [IU] | Freq: Once | INTRAVENOUS | Status: DC | PRN
  Filled 2016-04-03: qty 5

## 2016-04-03 MED ORDER — ACETAMINOPHEN 325 MG PO TABS
650.0000 mg | ORAL_TABLET | Freq: Once | ORAL | Status: AC
  Administered 2016-04-03: 650 mg via ORAL
  Filled 2016-04-03: qty 2

## 2016-04-03 MED ORDER — DIPHENHYDRAMINE HCL 50 MG/ML IJ SOLN: 25.0000 mg | Freq: Once | INTRAMUSCULAR | Status: DC | PRN

## 2016-04-03 MED ORDER — VANCOMYCIN 50 MG/ML ORAL SOLUTION: ORAL | 1 refills | Status: DC

## 2016-04-03 MED ORDER — DIPHENHYDRAMINE HCL 50 MG PO CAPS
50.0000 mg | ORAL_CAPSULE | Freq: Once | ORAL | Status: AC
  Administered 2016-04-03: 50 mg via ORAL
  Filled 2016-04-03: qty 1

## 2016-04-03 MED ORDER — ALBUTEROL SULFATE (2.5 MG/3ML) 0.083% IN NEBU: 2.5000 mg | INHALATION_SOLUTION | Freq: Once | RESPIRATORY_TRACT | Status: DC | PRN

## 2016-04-03 MED ORDER — CLOPIDOGREL BISULFATE 75 MG PO TABS: 75.0000 mg | ORAL_TABLET | Freq: Every evening | ORAL | Status: DC

## 2016-04-03 MED ORDER — SODIUM CHLORIDE 0.9 % IV SOLN
375.0000 mg/m2 | Freq: Once | INTRAVENOUS | Status: AC
  Administered 2016-04-03: 800 mg via INTRAVENOUS
  Filled 2016-04-03: qty 50

## 2016-04-03 NOTE — Discharge Summary (Addendum)
[place]  [phone] [phone]    Physician Discharge Summary     Patient ID: Tymarion Everard MRN: [id number] DOB/AGE: 1950/08/06 65 y.o.  Admit date: 03/30/2016 Discharge date: 04/03/2016  Primary Care Physician:  Ernestene Kiel, MD   Discharge Diagnoses:    Present on Admission: . Burkitt lymphoma of lymph nodes of multiple regions [facility])   Discharge Medications:    Medication List    TAKE these medications   amLODipine 10 MG tablet Commonly known as:  NORVASC Take 10 mg by mouth every evening.   aspirin 81 MG EC tablet Take 1 tablet (81 mg total) by mouth every evening.   CALCIUM PO Take 1 tablet by mouth every morning.   CENTRUM SILVER PO Take 1 tablet by mouth daily with breakfast.   clopidogrel 75 MG tablet Commonly known as:  PLAVIX Take 1 tablet (75 mg total) by mouth every evening.   dexamethasone 4 MG tablet Commonly known as:  DECADRON Take 2 tablets (8 mg total) by mouth 2 (two) times daily with a meal. Take two times a day starting the day after chemotherapy for 3 days.   lisinopril 20 MG tablet Commonly known as:  PRINIVIL,ZESTRIL Take 20 mg by mouth every evening.   LORazepam 0.5 MG tablet Commonly known as:  ATIVAN Take 1 tablet (0.5 mg total) by mouth every 6 (six) hours as needed (Nausea or vomiting).   magic mouthwash w/lidocaine Soln Take 5 mLs by mouth 4 (four) times daily. What changed:  when to take this  reasons to take this   mirtazapine 15 MG tablet Commonly known as:  REMERON Take 1 tablet (15 mg total) by mouth at bedtime.   ondansetron 8 MG tablet Commonly known as:  ZOFRAN Take 1 tablet (8 mg total) by mouth 2 (two) times daily. Take two times a day starting the day after chemo for 3 days. Then take two times a day as needed for nausea or vomiting.   oxyCODONE 5 MG immediate release tablet Commonly known as:  Oxy IR/ROXICODONE Take 1-2 tablets (5-10 mg total) by  mouth every 6 (six) hours as needed for moderate pain or severe pain.   polyethylene glycol packet Commonly known as:  MIRALAX Take 17 g by mouth daily as needed for moderate constipation.   prochlorperazine 10 MG tablet Commonly known as:  COMPAZINE Take 1 tablet (10 mg total) by mouth every 6 (six) hours as needed (Nausea or vomiting).   senna-docusate 8.6-50 MG tablet Commonly known as:  SENNA S Take 2 tablets by mouth at bedtime as needed for moderate constipation.   sulfamethoxazole-trimethoprim 800-160 MG tablet Commonly known as:  BACTRIM DS,SEPTRA DS Take 1 tablet by mouth daily.   vancomycin 50 mg/mL oral solution Commonly known as:  VANCOCIN 2.51m (1247m po twice daily for c diff suppression while on chemotherapy What changed:  how much to take  how to take this  when to take this  additional instructions   vitamin C 500 MG tablet Commonly known as:  ASCORBIC ACID Take 500 mg by mouth 2 (two) times daily.   Vitamin D3 2000 units Tabs Take 2,000 Units by mouth daily with lunch.        Disposition and Follow-up:   Significant Diagnostic Studies:  Dg Fluoro Guided Loc Of Needle/cath Tip For Spinal Inject Rt  Result Date: 03/08/2016 CLINICAL DATA:  [age]ear old male presents for his third intrathecal chemotherapy injection. Burkitt lymphoma. Subsequent encounter. EXAM: FLUOROSCOPICALLY GUIDED LUMBAR  PUNCTURE FOR INTRATHECAL CHEMOTHERAPY TECHNIQUE: Informed consent was obtained from the patient prior to the procedure, including potential complications of headache, allergy, and pain. A 'time out' was performed. With the patient prone, the lower back was prepped with Betadine. 1% Lidocaine was used for local anesthesia. Lumbar puncture was performed at the L2-L3 level using left sub laminar technique, and a 3.5 inch x 20 gauge needle with return of clear CSF. The chemotherapy mixture sent from pharmacy labeled with the patient's name and date of birth was slowly  injected into the subarachnoid space. The patient tolerated the procedure well without apparent complication. Appropriate postprocedural orders were placed in Epic and he was returned to the inpatient ward for further treatment. FLUOROSCOPY TIME:  0 minutes 12 seconds IMPRESSION: Intrathecal injection of chemotherapy at O6-V6 without complication. Electronically Signed   By: Genevie Ann M.D.   On: 03/08/2016 14:14   Dg Fluoro Guided Loc Of Needle/cath Tip For Spinal Inject Lt  Result Date: 03/30/2016 CLINICAL DATA:  Burkitt's lymphoma. Intrathecal methotrexate injection. EXAM: FLUOROSCOPICALLY GUIDED LUMBAR PUNCTURE FOR INTRATHECAL CHEMOTHERAPY TECHNIQUE: Informed consent was obtained from the patient prior to the procedure, including potential complications of headache, allergy, and pain. A 'time out' was performed. With the patient prone, the lower back was prepped with Betadine. 1% Lidocaine was used for local anesthesia. Lumbar puncture was performed at the L3-4 using a 20 gauge needle with return of clear CSF. 10 mL of methotrexate was injected into the subarachnoid space. The patient tolerated the procedure well without apparent complication. FLUOROSCOPY TIME:  24 seconds IMPRESSION: Intrathecal injection of chemotherapy without complication Electronically Signed   By: Rolm Baptise M.D.   On: 03/30/2016 12:55    Discharge Laboratory Values: . CBC Latest Ref Rng & Units 04/03/2016 04/02/2016 04/01/2016  WBC 4.0 - 10.5 K/uL 7.5 11.3(H) 12.5(H)  Hemoglobin 13.0 - 17.0 g/dL 10.1(L) 10.7(L) 9.9(L)  Hematocrit 39.0 - 52.0 % 30.7(L) 33.2(L) 31.0(L)  Platelets 150 - 400 K/uL 400 409(H) 403(H)   . CMP Latest Ref Rng & Units 04/03/2016 04/02/2016 04/01/2016  Glucose 65 - 99 mg/dL 237(H) 120(H) 157(H)  BUN 6 - 20 mg/dL _0 Creatinine 0.61 - 1.24 mg/dL 0.79 0.55(L) 0.58(L)  Sodium 135 - 145 mmol/L 136 140 140  Potassium 3.5 - 5.1 mmol/L 3.4(L) 3.9 4.1  Chloride 101 - 111 mmol/L 101 104 105  CO2 22 - 32  mmol/L _1 Calcium 8.9 - 10.3 mg/dL 9.0 9.4 9.4  Total Protein 6.5 - 8.1 g/dL - - -  Total Bilirubin 0.3 - 1.2 mg/dL - - -  Alkaline Phos 38 - 126 U/L - - -  AST 15 - 41 U/L - - -  ALT 17 - 63 U/L - - -    Brief H and P: For complete details please refer to admission H and P, but in brief,  Patient was admitted for his 5th cycle of EPOCH-R for Burkitt's lymphoma (with t(8;14) translocation).  He was recently admitted to [facility] and diagnosed with a recurrent urinary tract infection and C. difficile colitis. He had completed levofloxacin for his urinary tract infection. Was given metronidazole for total of 7 days after which we give him a prescription for oral vancomycin to continue for a total of at least 14 days followed by prophylactic dosing while on chemotherapy. He reports that his diarrhea has completely resolved at this time and he has no further fevers. He notes no acute new symptoms and  feels okay with proceeding with his planned treatment. No other focal symptoms at this time. No new lymphadenopathy. No abdominal pain. No chest pain or shortness of breath. Patient received his 5th cycle of EPOCH-R and 3rd dose of prophylactic IT methotrexate for CNS prophylaxis without any acute issues or prohibitive toxicities   Issues during hospitalization 1) Stage IVB Burkitt's lymphoma with t(8;14) translocation diagnosed 12/23/2015. This appears to be involving the sacrum causing sacral and root involvement with possible neurogenic bladder. PET CT scan as noted above shows extensive involvement with lymphoma. CSF negative for involvement. Bone marrow biopsy negative for involvement by lymphoma.   CURRENT TREATMENT The patient has completed 4cycles of EPOCH -R and 2 IT methotrexate doses  Planned treatment 6 cycles of EPOCH-R  IT methotrexate x 4 doses starting with cycle 3 for CNS prophylaxis  PLAN -received C5 of EPOCH-R and  his 3rddose of intrathecal  methotrexate D1 by IR. No back pain no spinal headaches -We'll continue the same supportive medications. -Outpatient Neulasta on day 7(monday)at 11AM -f/u with Dr Irene Limbo with labs as er appointment   2) C diff diarrhea -resolved -will switch to suppressive doses of PO vancomycin BID on discharge while on chemotherapy for the next 6-8 weeks. -Would avoid probiotics while on chemotherapy.  3)Recurrent UTI -Given his recurrent UTI will place on prophylacticBactrim on discharge.  3) DVTprophylaxis - TEDS and SCD . We'll start Lovenox 24 hours after lumbar puncture .  4) HTN -continue outpatient medications   5) PAD - has been on outpatient aspirin and Plavix . Was held for his planned lumbar puncture . Back on ASA and plavix   Physical Exam at Discharge: BP (!) 155/82 (BP Location: Right Arm)   Pulse 87   Temp 97.6 F (36.4 C) (Oral)   Resp 16   Ht 6' (1.829 m)   Wt 176 lb 3.2 oz (79.9 kg)   SpO2 98%   BMI 23.90 kg/m  GENERAL:alert, in no acute distress and comfortable SKIN: skin color, texture, turgor are normal, no rashes or significant lesions EYES: normal, conjunctiva are pink and non-injected, sclera clear OROPHARYNX:no exudate, no erythema and lips, buccal mucosa, and tongue normal  NECK: supple, no JVD, thyroid normal size, non-tender, without nodularity LYMPH:  no palpable lymphadenopathy in the cervical, axillary or inguinal LUNGS: clear to auscultation with normal respiratory effort HEART: regular rate & rhythm,  no murmurs and no lower extremity edema ABDOMEN: abdomen soft, non-tender, normoactive bowel sounds  Musculoskeletal: no cyanosis of digits and no clubbing  PSYCH: alert & oriented x 3 with fluent speech NEURO: no focal motor/sensory deficits   Hospital Course:  Active Problems:   Burkitt lymphoma of lymph nodes of multiple regions ([place])   Enteritis due to Clostridium difficile   Diet:  Regular diet  Activity:  As tolerated, Infection  prevention techniques as counseled  Condition at Discharge:   Stable  Signed: Dr. Sullivan Lone MD MS [phone]  TT>32mns 04/03/2016

## 2016-04-03 NOTE — Progress Notes (Signed)
Dosage and calculations checked and verified for chemotherapy with Lottie Dawson, RN.

## 2016-04-05 ENCOUNTER — Other Ambulatory Visit: Payer: Self-pay | Admitting: *Deleted

## 2016-04-05 ENCOUNTER — Ambulatory Visit (HOSPITAL_BASED_OUTPATIENT_CLINIC_OR_DEPARTMENT_OTHER): Payer: PRIVATE HEALTH INSURANCE

## 2016-04-05 VITALS — BP 145/75 | HR 94 | Temp 98.5°F | Resp 20

## 2016-04-05 DIAGNOSIS — C8378 Burkitt lymphoma, lymph nodes of multiple sites: Secondary | ICD-10-CM

## 2016-04-05 DIAGNOSIS — Z5189 Encounter for other specified aftercare: Secondary | ICD-10-CM | POA: Diagnosis not present

## 2016-04-05 MED ORDER — PEGFILGRASTIM INJECTION 6 MG/0.6ML ~~LOC~~
6.0000 mg | PREFILLED_SYRINGE | Freq: Once | SUBCUTANEOUS | Status: AC
  Administered 2016-04-05: 6 mg via SUBCUTANEOUS
  Filled 2016-04-05: qty 0.6

## 2016-04-05 NOTE — Patient Instructions (Signed)
Pegfilgrastim injection What is this medicine? PEGFILGRASTIM (PEG fil gra stim) is a long-acting granulocyte colony-stimulating factor that stimulates the growth of neutrophils, a type of white blood cell important in the body's fight against infection. It is used to reduce the incidence of fever and infection in patients with certain types of cancer who are receiving chemotherapy that affects the bone marrow, and to increase survival after being exposed to high doses of radiation. This medicine may be used for other purposes; ask your health care provider or pharmacist if you have questions. What should I tell my health care provider before I take this medicine? They need to know if you have any of these conditions: -kidney disease -latex allergy -ongoing radiation therapy -sickle cell disease -skin reactions to acrylic adhesives (On-Body Injector only) -an unusual or allergic reaction to pegfilgrastim, filgrastim, other medicines, foods, dyes, or preservatives -pregnant or trying to get pregnant -breast-feeding How should I use this medicine? This medicine is for injection under the skin. If you get this medicine at home, you will be taught how to prepare and give the pre-filled syringe or how to use the On-body Injector. Refer to the patient Instructions for Use for detailed instructions. Use exactly as directed. Take your medicine at regular intervals. Do not take your medicine more often than directed. It is important that you put your used needles and syringes in a special sharps container. Do not put them in a trash can. If you do not have a sharps container, call your pharmacist or healthcare provider to get one. Talk to your pediatrician regarding the use of this medicine in children. While this drug may be prescribed for selected conditions, precautions do apply. Overdosage: If you think you have taken too much of this medicine contact a poison control center or emergency room at  once. NOTE: This medicine is only for you. Do not share this medicine with others. What if I miss a dose? It is important not to miss your dose. Call your doctor or health care professional if you miss your dose. If you miss a dose due to an On-body Injector failure or leakage, a new dose should be administered as soon as possible using a single prefilled syringe for manual use. What may interact with this medicine? Interactions have not been studied. Give your health care provider a list of all the medicines, herbs, non-prescription drugs, or dietary supplements you use. Also tell them if you smoke, drink alcohol, or use illegal drugs. Some items may interact with your medicine. This list may not describe all possible interactions. Give your health care provider a list of all the medicines, herbs, non-prescription drugs, or dietary supplements you use. Also tell them if you smoke, drink alcohol, or use illegal drugs. Some items may interact with your medicine. What should I watch for while using this medicine? You may need blood work done while you are taking this medicine. If you are going to need a MRI, CT scan, or other procedure, tell your doctor that you are using this medicine (On-Body Injector only). What side effects may I notice from receiving this medicine? Side effects that you should report to your doctor or health care professional as soon as possible: -allergic reactions like skin rash, itching or hives, swelling of the face, lips, or tongue -dizziness -fever -pain, redness, or irritation at site where injected -pinpoint red spots on the skin -red or dark-brown urine -shortness of breath or breathing problems -stomach or side pain, or pain   at the shoulder -swelling -tiredness -trouble passing urine or change in the amount of urine Side effects that usually do not require medical attention (report to your doctor or health care professional if they continue or are  bothersome): -bone pain -muscle pain This list may not describe all possible side effects. Call your doctor for medical advice about side effects. You may report side effects to FDA at 1-800-FDA-1088. Where should I keep my medicine? Keep out of the reach of children. Store pre-filled syringes in a refrigerator between 2 and 8 degrees C (36 and 46 degrees F). Do not freeze. Keep in carton to protect from light. Throw away this medicine if it is left out of the refrigerator for more than 48 hours. Throw away any unused medicine after the expiration date. NOTE: This sheet is a summary. It may not cover all possible information. If you have questions about this medicine, talk to your doctor, pharmacist, or health care provider.    2016, Elsevier/Gold Standard. (2014-06-06 14:30:14)  

## 2016-04-05 NOTE — Progress Notes (Signed)
[name]    HEMATOLOGY/ONCOLOGY CLINIC NOTE  Date of Service: 03/23/2016  Patient Care Team: Ernestene Kiel, MD as PCP - General (Internal Medicine) [name], MD as Consulting Physician (Orthopedic Surgery)  CHIEF COMPLAINTS/PURPOSE OF CONSULTATION:  F/u for Burkitt's lymphoma  DIAGNOSIS Stage IVB Burkitt's lymphoma with t(8;14) translocation diagnosed 12/23/2015. This appears to be involving the sacrum causing sacral and root involvement with possible neurogenic bladder. PET CT scan as noted above shows extensive involvement with lymphoma. CSF negative for involvement. Bone marrow biopsy negative for involvement by lymphoma.   CURRENT TREATMENT The patient has completed 3 cycles of EPOCH -R  Planned treatment 6 cycles of EPOCH-R + IT methotrexate x 4 doses starting with cycle 3 for CNS prophylaxis   HISTORY OF PRESENTING ILLNESS:  Please see my previous note for details  INTERVAL HISTORY  Mr. Gindlesperger is here for a follow-up with his wife. He was recently admitted to [facility] with a urinary tract infection again and C. difficile colitis. He has completed treatment with levofloxacin for his UTI and is on Flagyl for his C. difficile colitis. He was only given about 7 days of treatment from outside hospital. We have given a prescription of oral vancomycin to complete a minimum of 14 days after which he will have to likely be on preventative antibiotics while on chemotherapy. No abdominal pain or overt diarrhea at this time. We discussed admitting him on Tuesday for his next cycle of chemotherapy. No other acute new symptoms.  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   He smokes about 2 packs a day for 40 years.  SURGICAL HISTORY:  #1 left submandibular salivary gland benign  tumor excision #2 port placement - 12/31/2015  SOCIAL HISTORY: Social History   Social History  . Marital status: Married    Spouse name: N/A  . Number of children: N/A  . Years of education: N/A   Occupational History  . meter specialist    Social History Main Topics  . Smoking status: Former Smoker    Packs/day: 2.00    Years: 16.00    Quit date: 12/22/2005  . Smokeless tobacco: Never Used  . Alcohol use Yes     Comment: occasionally  . Drug use: No  . Sexual activity: No   Other Topics Concern  . Not on file   Social History Narrative  . No narrative on file  smoked 2 packs of cigarettes per day for 40 years quit about 8-9 years ago. Denies significant alcohol use. 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  FAMILY HISTORY:  Sister had breast cancer at age 536 years. Unknown if she had genetic testing One maternal uncle had melanoma Second maternal uncle had pancreatic cancer under the age of 46yrPaternal uncle with prostate cancer Dad had prostate cancer at age 5318years   ALLERGIES:  has No Known Allergies.  MEDICATIONS:  Current Outpatient Prescriptions  Medication Sig Dispense Refill  . amLODipine (NORVASC) 10 MG tablet Take 10 mg by mouth every evening.     . Cholecalciferol (VITAMIN D3) 2000 units TABS Take 2,000 Units by mouth daily with lunch.     . dexamethasone (  DECADRON) 4 MG tablet Take 2 tablets (8 mg total) by mouth 2 (two) times daily with a meal. Take two times a day starting the day after chemotherapy for 3 days. 30 tablet 1  . lisinopril (PRINIVIL,ZESTRIL) 20 MG tablet Take 20 mg by mouth every evening.     [name] LORazepam (ATIVAN) 0.5 MG tablet Take 1 tablet (0.5 mg total) by mouth every 6 (six) hours as needed (Nausea or vomiting). 30 tablet 0  . magic mouthwash w/lidocaine SOLN  Take 5 mLs by mouth 4 (four) times daily. (Patient taking differently: Take 5 mLs by mouth 4 (four) times daily as needed for mouth pain. ) 120 mL 0  . mirtazapine (REMERON) 15 MG tablet Take 1 tablet (15 mg total) by mouth at bedtime. (Patient not taking: Reported on 03/30/2016) 30 tablet 2  . Multiple Vitamins-Minerals (CENTRUM SILVER PO) Take 1 tablet by mouth daily with breakfast.     . ondansetron (ZOFRAN) 8 MG tablet Take 1 tablet (8 mg total) by mouth 2 (two) times daily. Take two times a day starting the day after chemo for 3 days. Then take two times a day as needed for nausea or vomiting. 30 tablet 1  . oxyCODONE (OXY IR/ROXICODONE) 5 MG immediate release tablet Take 1-2 tablets (5-10 mg total) by mouth every 6 (six) hours as needed for moderate pain or severe pain. 60 tablet 0  . polyethylene glycol (MIRALAX) packet Take 17 g by mouth daily as needed for moderate constipation.    . prochlorperazine (COMPAZINE) 10 MG tablet Take 1 tablet (10 mg total) by mouth every 6 (six) hours as needed (Nausea or vomiting). 30 tablet 1  . senna-docusate (SENNA S) 8.6-50 MG tablet Take 2 tablets by mouth at bedtime as needed for moderate constipation.    . vitamin C (ASCORBIC ACID) 500 MG tablet Take 500 mg by mouth 2 (two) times daily.    [name] aspirin EC 81 MG EC tablet Take 1 tablet (81 mg total) by mouth every evening.    [name] CALCIUM PO Take 1 tablet by mouth every morning.    . clopidogrel (PLAVIX) 75 MG tablet Take 1 tablet (75 mg total) by mouth every evening.    . sulfamethoxazole-trimethoprim (BACTRIM DS,SEPTRA DS) 800-160 MG tablet Take 1 tablet by mouth daily. 30 tablet 1  . vancomycin (VANCOCIN) 50 mg/mL oral solution 2.28m (123m po twice daily for c diff suppression while on chemotherapy 100 mL 1   No current facility-administered medications for this visit.     REVIEW OF SYSTEMS:    10 Point review of Systems was done is negative except as noted above.  PHYSICAL EXAMINATION: ECOG  PERFORMANCE STATUS: 1 - Symptomatic but completely ambulatory  . Vitals:   03/23/16 1143  BP: (!) 148/66  Pulse: 89  Resp: 16  Temp: 97.6 F (36.4 C)   Filed Weights   03/23/16 1143  Weight: 190 lb 4.8 oz (86.3 kg)   .Body mass index is 25.81 kg/m.  GENERAL:alert, in no acute distress and comfortable SKIN: skin color, texture, turgor are normal, no rashes or significant lesions EYES: normal, conjunctiva are pink and non-injected, sclera clear OROPHARYNX:no exudate, no erythema and lips, buccal mucosa, and tongue normal  NECK: supple, no JVD, thyroid normal size, non-tender, without nodularity LYMPH:  no palpable lymphadenopathy in the cervical, axillary or inguinal LUNGS: clear to auscultation with normal respiratory effort, large left anterior chest wall mass noted appears nontender and fixed. HEART: regular rate & rhythm,  no murmurs and no lower extremity edema ABDOMEN: abdomen soft, non-tender, normoactive bowel sounds  Musculoskeletal: no cyanosis of digits and no clubbing  PSYCH: alert & oriented x 3 with fluent speech NEURO: no focal motor/sensory deficits  LABORATORY DATA:  I have reviewed the data as listed  Component     Latest Ref Rng & Units 03/23/2016  WBC     4.0 - 10.5 K/uL 11.4 (H)  NEUT#     1.5 - 6.5 10e3/uL 9.6 (H)  Hemoglobin     13.0 - 17.0 g/dL 9.6 (L)  HCT     39.0 - 52.0 % 30.3 (L)  Platelets     150 - 400 K/uL 194  MCV     78.0 - 100.0 fL 80.9  MCH     26.0 - 34.0 pg 25.7 (L)  MCHC     30.0 - 36.0 g/dL 31.7 (L)  RBC     4.22 - 5.81 MIL/uL 3.75 (L)  RDW     11.5 - 15.5 % 21.0 (H)  lymph#     0.9 - 3.3 10e3/uL 0.9  MONO#     0.1 - 0.9 10e3/uL 0.8  Eosinophils Absolute     0.0 - 0.5 10e3/uL 0.0  Basophils Absolute     0.0 - 0.1 10e3/uL 0.1  NEUT%     39.0 - 75.0 % 84.0 (H)  LYMPH%     14.0 - 49.0 % 7.6 (L)  MONO%     0.0 - 14.0 % 7.0  EOS%     0.0 - 7.0 % 0.3  BASO%     0.0 - 2.0 % 1.1  Sodium     136 - 145 mEq/L 140    Potassium     3.5 - 5.1 mEq/L 3.4 (L)  Chloride     98 - 109 mEq/L 103  CO2     22 - 29 mEq/L 26  Glucose     70 - 140 mg/dl 193 (H)  BUN     7.0 - 26.0 mg/dL 8.5  Creatinine     0.7 - 1.3 mg/dL 0.6 (L)  Total Bilirubin     0.20 - 1.20 mg/dL <0.22  Alkaline Phosphatase     40 - 150 U/L 89  AST     5 - 34 U/L 28  ALT     0 - 55 U/L 19  Total Protein     6.4 - 8.3 g/dL 5.5 (L)  Albumin     3.5 - 5.0 g/dL 2.7 (L)  Calcium     8.4 - 10.4 mg/dL 8.5  Anion gap     3 - 11 mEq/L 11  EGFR     >90 ml/min/1.73 m2 >90  LDH     125 - 245 U/L 395 (H)   .  RADIOGRAPHIC STUDIES: I have personally reviewed the radiological images as listed and agreed with the findings in the report. Dg Fluoro Guided Loc Of Needle/cath Tip For Spinal Inject Rt  Result Date: 03/08/2016 CLINICAL DATA:  65 year old male presents for his third intrathecal chemotherapy injection. Burkitt lymphoma. Subsequent encounter. EXAM: FLUOROSCOPICALLY GUIDED LUMBAR PUNCTURE FOR INTRATHECAL CHEMOTHERAPY TECHNIQUE: Informed consent was obtained from the patient prior to the procedure, including potential complications of headache, allergy, and pain. A 'time out' was performed. With the patient prone, the lower back was prepped with Betadine. 1% Lidocaine was used for local anesthesia. Lumbar puncture was performed at the L2-L3 level using left sub laminar technique, and a  3.5 inch x 20 gauge needle with return of clear CSF. The chemotherapy mixture sent from pharmacy labeled with the patient's name and date of birth was slowly injected into the subarachnoid space. The patient tolerated the procedure well without apparent complication. Appropriate postprocedural orders were placed in Epic and he was returned to the inpatient ward for further treatment. FLUOROSCOPY TIME:  0 minutes 12 seconds IMPRESSION: Intrathecal injection of chemotherapy at E3-X5 without complication. Electronically Signed   By: Genevie Ann M.D.   On: 03/08/2016  14:14   Dg Fluoro Guided Loc Of Needle/cath Tip For Spinal Inject Lt  Result Date: 03/30/2016 CLINICAL DATA:  Burkitt's lymphoma. Intrathecal methotrexate injection. EXAM: FLUOROSCOPICALLY GUIDED LUMBAR PUNCTURE FOR INTRATHECAL CHEMOTHERAPY TECHNIQUE: Informed consent was obtained from the patient prior to the procedure, including potential complications of headache, allergy, and pain. A 'time out' was performed. With the patient prone, the lower back was prepped with Betadine. 1% Lidocaine was used for local anesthesia. Lumbar puncture was performed at the L3-4 using a 20 gauge needle with return of clear CSF. 10 mL of methotrexate was injected into the subarachnoid space. The patient tolerated the procedure well without apparent complication. FLUOROSCOPY TIME:  24 seconds IMPRESSION: Intrathecal injection of chemotherapy without complication Electronically Signed   By: Rolm Baptise M.D.   On: 03/30/2016 12:55    ASSESSMENT & PLAN:   65 year old Caucasian male with  #1 Stage IVB Burkitt's lymphoma with t(8;14) translocation This appears to be involving the sacrum causing sacral and root involvement with possible neurogenic bladder. PET CT scan as noted above shows extensive involvement with lymphoma. CSF negative for involvement. Bone marrow biopsy negative for involvement by lymphoma. Patient has completed cycle 3 of EPOCH-R and tolerated it well with no prohibitive toxicities. Grade 1 fatigue.  He has had some episodes of fevers including a periodontal abscess,  non-neutropenic fevers - unclear etiology, recent Escherichia coli urinary tract infection on 02/18/2016. He is finishing his Keflex today.  #2 normocytic anemia likely related to chemotherapy.  No evidence of bleeding. Improving hemoglobin  #3 Escherichia coli urinary tract infection - recurrent completed antibiotics.  #4 diarrhea-C. difficile colitis - has completed about 6 days of Flagyl . Plan -Completed antibiotics for UTI    -We'll give prescription for oral vancomycin to complete at least 14 days of treatment for C. difficile and then we'll need C. difficile prophylaxis while on chemotherapy. -We shall admit the patient on Tuesday 03/30/2016 for cycle 5 of EPOCH-R chemotherapy with intrathecal methotrexate day 1 of his cycle by IR (for CNS prophylaxis).  -Peridex mouth washes 3 times a day for a week and then twice daily. -patient was counseled on the need for absolute attention to infection prevention and to avoid crowds. -We'll need Bactrim prophylaxis on discharge .  #4 left sacral ala and S1 fracture. L5-S1 and S1-S2 left-sided nerve root compression. #5 neoplasm related pain - well-controlled.  Minimal use of oxycodone. Plan -Pain well controlled. - when necessary oxycodone for intermittent neoplasm related pain. -Senna S and MiraLAX as needed. -SI joint brace and help with pain control.  #4 Hypertension, dyslipidemia, peripheral arterial disease -Continue management as per primary care physician.  #5 ex-smoker with 80-pack-year history of smoking.  Inpatient admission for chemotherapy on 03/08/2016  I spent 20 minutes counseling the patient face to face. The total time spent in the appointment was 25 minutes and more than 50% was on counseling and direct patient cares and co-ordinating inpatient hospitalization logistics  Sullivan Lone MD [place] [facility] Hematology/Oncology Physician [facility]  (Office):       [phone] (Work cell):  [phone] (Fax):           [phone]

## 2016-04-07 ENCOUNTER — Other Ambulatory Visit: Payer: Self-pay | Admitting: *Deleted

## 2016-04-07 DIAGNOSIS — C8370 Burkitt lymphoma, unspecified site: Secondary | ICD-10-CM

## 2016-04-07 MED ORDER — MAGIC MOUTHWASH W/LIDOCAINE: 5.0000 mL | Freq: Four times a day (QID) | ORAL | 0 refills | Status: DC | PRN

## 2016-04-08 ENCOUNTER — Other Ambulatory Visit: Payer: Self-pay | Admitting: *Deleted

## 2016-04-08 DIAGNOSIS — C8378 Burkitt lymphoma, lymph nodes of multiple sites: Secondary | ICD-10-CM

## 2016-04-09 ENCOUNTER — Encounter: Payer: Self-pay | Admitting: Hematology

## 2016-04-09 ENCOUNTER — Ambulatory Visit (HOSPITAL_BASED_OUTPATIENT_CLINIC_OR_DEPARTMENT_OTHER): Payer: PRIVATE HEALTH INSURANCE | Admitting: Hematology

## 2016-04-09 ENCOUNTER — Other Ambulatory Visit: Payer: Self-pay | Admitting: *Deleted

## 2016-04-09 ENCOUNTER — Telehealth: Payer: Self-pay | Admitting: Hematology

## 2016-04-09 ENCOUNTER — Other Ambulatory Visit (HOSPITAL_BASED_OUTPATIENT_CLINIC_OR_DEPARTMENT_OTHER): Payer: PRIVATE HEALTH INSURANCE

## 2016-04-09 VITALS — BP 105/57 | HR 99 | Temp 99.8°F | Resp 20 | Wt 178.2 lb

## 2016-04-09 DIAGNOSIS — D701 Agranulocytosis secondary to cancer chemotherapy: Secondary | ICD-10-CM | POA: Diagnosis not present

## 2016-04-09 DIAGNOSIS — C8378 Burkitt lymphoma, lymph nodes of multiple sites: Secondary | ICD-10-CM

## 2016-04-09 DIAGNOSIS — D649 Anemia, unspecified: Secondary | ICD-10-CM | POA: Diagnosis not present

## 2016-04-09 DIAGNOSIS — D702 Other drug-induced agranulocytosis: Secondary | ICD-10-CM

## 2016-04-09 DIAGNOSIS — R197 Diarrhea, unspecified: Secondary | ICD-10-CM

## 2016-04-09 LAB — COMPREHENSIVE METABOLIC PANEL
ALBUMIN: 3.2 g/dL — AB (ref 3.5–5.0)
ALK PHOS: 77 U/L (ref 40–150)
ALT: 22 U/L (ref 0–55)
ANION GAP: 10 meq/L (ref 3–11)
AST: 16 U/L (ref 5–34)
BUN: 15.7 mg/dL (ref 7.0–26.0)
CALCIUM: 9 mg/dL (ref 8.4–10.4)
CO2: 26 mEq/L (ref 22–29)
CREATININE: 0.7 mg/dL (ref 0.7–1.3)
Chloride: 98 mEq/L (ref 98–109)
EGFR: >90 mL/min/{1.73_m2} (ref >90)
Glucose: 122 mg/dl (ref 70–140)
Potassium: 4.1 mEq/L (ref 3.5–5.1)
Sodium: 134 mEq/L — ABNORMAL LOW (ref 136–145)
Total Bilirubin: 0.95 mg/dL (ref 0.20–1.20)
Total Protein: 5.7 g/dL — ABNORMAL LOW (ref 6.4–8.3)

## 2016-04-09 LAB — CBC WITH DIFFERENTIAL/PLATELET
BASO%: 0 % (ref 0.0–2.0)
Basophils Absolute: 0 10*3/uL (ref 0.0–0.1)
EOS ABS: 0 10*3/uL (ref 0.0–0.5)
EOS%: 4.5 % (ref 0.0–7.0)
HEMATOCRIT: 30.5 % — AB (ref 38.4–49.9)
HEMOGLOBIN: 9.9 g/dL — AB (ref 13.0–17.1)
LYMPH#: 0.5 10*3/uL — AB (ref 0.9–3.3)
LYMPH%: 53.4 % — ABNORMAL HIGH (ref 14.0–49.0)
MCH: 26.9 pg — ABNORMAL LOW (ref 27.2–33.4)
MCHC: 32.5 g/dL (ref 32.0–36.0)
MCV: 82.9 fL (ref 79.3–98.0)
MONO#: 0.2 10*3/uL (ref 0.1–0.9)
MONO%: 23.9 % — ABNORMAL HIGH (ref 0.0–14.0)
NEUT%: 18.2 % — AB (ref 39.0–75.0)
NEUTROS ABS: 0.2 10*3/uL — AB (ref 1.5–6.5)
PLATELETS: 179 10*3/uL (ref 140–400)
RBC: 3.68 10*6/uL — ABNORMAL LOW (ref 4.20–5.82)
RDW: 19.6 % — ABNORMAL HIGH (ref 11.0–14.6)
WBC: 0.9 10*3/uL — AB (ref 4.0–10.3)
nRBC: 0 % (ref 0–0)

## 2016-04-09 LAB — LACTATE DEHYDROGENASE: LDH: 265 U/L — AB (ref 125–245)

## 2016-04-09 NOTE — Telephone Encounter (Signed)
Appointments scheduled per 04/09/16 los. AVS report and appointment schedule was given to patient, per 04/09/16 los.

## 2016-04-11 NOTE — Progress Notes (Signed)
Erik Vaughn    HEMATOLOGY/ONCOLOGY CLINIC NOTE  Date of Service: 04/09/2016  Patient Care Team: Ernestene Kiel, MD as PCP - General (Internal Medicine) [name], MD as Consulting Physician (Orthopedic Surgery)  CHIEF COMPLAINTS/PURPOSE OF CONSULTATION:  F/u for Burkitt's lymphoma  DIAGNOSIS Stage IVB Burkitt's lymphoma with t(8;14) translocation diagnosed 12/23/2015. This appears to be involving the sacrum causing sacral and root involvement with possible neurogenic bladder. PET CT scan as noted above shows extensive involvement with lymphoma. CSF negative for involvement. Bone marrow biopsy negative for involvement by lymphoma.   CURRENT TREATMENT The patient has completed 5 cycles of EPOCH -R  Planned treatment 6 cycles of EPOCH-R + IT methotrexate x 4 doses starting with cycle 3 for CNS prophylaxis (he has completed 3 out of the 4 planned doses of intrathecal methotrexate).   HISTORY OF PRESENTING ILLNESS:  Please see my previous note for details  INTERVAL HISTORY  [name] is here for a follow-up with his wife for posthospitalization follow-up after completing his fifth cycle of EPOCH-R and 3rd dose of intrathecal methotrexate. He is profoundly neutropenic today but has no fevers. He is on prophylactic Bactrim given his recurrent neutropenic fevers associated with urinary tract infections. He is tolerating this okay. He recently completed treatment for Clostridium difficile diarrhea and is on maintenance vancomycin orally while on chemotherapy to avoid another flare of C. Difficile. Notes some mild grade 1 fatigue. Eating well. No significant diarrhea. No other acute new symptoms.   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   He smoked about 2 packs a day for 40 years.  SURGICAL  HISTORY:  #1 left submandibular salivary gland benign tumor excision #2 port placement - 12/31/2015  SOCIAL HISTORY: Social History   Social History  . Marital status: Married    Spouse name: N/A  . Number of children: N/A  . Years of education: N/A   Occupational History  . meter specialist    Social History Main Topics  . Smoking status: Former Smoker    Packs/day: 2.00    Years: 16.00    Quit date: 12/22/2005  . Smokeless tobacco: Never Used  . Alcohol use Yes     Comment: occasionally  . Drug use: No  . Sexual activity: No   Other Topics Concern  . Not on file   Social History Narrative  . No narrative on file  smoked 2 packs of cigarettes per day for 40 years quit about 8-9 years ago. Denies significant alcohol use. 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  FAMILY HISTORY:  Sister had breast cancer at age 991 years. Unknown if she had genetic testing One maternal uncle had melanoma Second maternal uncle had pancreatic cancer under the age of 13yrPaternal uncle with prostate cancer Dad had prostate cancer at age 996years   ALLERGIES:  has No Known Allergies.  MEDICATIONS:  Current Outpatient Prescriptions  Medication Sig Dispense Refill  . amLODipine (NORVASC) 10 MG tablet Take 10 mg by mouth every evening.     .Erik Kitchenaspirin EC 81 MG EC tablet Take 1 tablet (81 mg total) by mouth every evening.    .Erik KitchenCALCIUM PO Take  1 tablet by mouth every morning.    . Cholecalciferol (VITAMIN D3) 2000 units TABS Take 2,000 Units by mouth daily with lunch.     . clopidogrel (PLAVIX) 75 MG tablet Take 1 tablet (75 mg total) by mouth every evening.    Erik Vaughn dexamethasone (DECADRON) 4 MG tablet Take 2 tablets (8 mg total) by mouth 2 (two) times daily with a meal. Take two times a day starting the day after chemotherapy  for 3 days. 30 tablet 1  . lisinopril (PRINIVIL,ZESTRIL) 20 MG tablet Take 20 mg by mouth every evening.     Erik Vaughn LORazepam (ATIVAN) 0.5 MG tablet Take 1 tablet (0.5 mg total) by mouth every 6 (six) hours as needed (Nausea or vomiting). 30 tablet 0  . magic mouthwash w/lidocaine SOLN Take 5 mLs by mouth 4 (four) times daily as needed for mouth pain. 120 mL 0  . mirtazapine (REMERON) 15 MG tablet Take 1 tablet (15 mg total) by mouth at bedtime. 30 tablet 2  . Multiple Vitamins-Minerals (CENTRUM SILVER PO) Take 1 tablet by mouth daily with breakfast.     . ondansetron (ZOFRAN) 8 MG tablet Take 1 tablet (8 mg total) by mouth 2 (two) times daily. Take two times a day starting the day after chemo for 3 days. Then take two times a day as needed for nausea or vomiting. 30 tablet 1  . oxyCODONE (OXY IR/ROXICODONE) 5 MG immediate release tablet Take 1-2 tablets (5-10 mg total) by mouth every 6 (six) hours as needed for moderate pain or severe pain. 60 tablet 0  . polyethylene glycol (MIRALAX) packet Take 17 g by mouth daily as needed for moderate constipation.    . prochlorperazine (COMPAZINE) 10 MG tablet Take 1 tablet (10 mg total) by mouth every 6 (six) hours as needed (Nausea or vomiting). 30 tablet 1  . senna-docusate (SENNA S) 8.6-50 MG tablet Take 2 tablets by mouth at bedtime as needed for moderate constipation.    . sulfamethoxazole-trimethoprim (BACTRIM DS,SEPTRA DS) 800-160 MG tablet Take 1 tablet by mouth daily. 30 tablet 1  . vancomycin (VANCOCIN) 50 mg/mL oral solution 2.85m (1233m po twice daily for c diff suppression while on chemotherapy 100 mL 1  . vitamin C (ASCORBIC ACID) 500 MG tablet Take 500 mg by mouth 2 (two) times daily.     No current facility-administered medications for this visit.     REVIEW OF SYSTEMS:    10 Point review of Systems was done is negative except as noted above.  PHYSICAL EXAMINATION: ECOG PERFORMANCE STATUS: 1 - Symptomatic but completely  ambulatory  . Vitals:   04/09/16 1022  BP: (!) 105/57  Pulse: 99  Resp: 20  Temp: 99.8 F (37.7 C)   Filed Weights   04/09/16 1022  Weight: 178 lb 3.2 oz (80.8 kg)   .Body mass index is 24.17 kg/m.  GENERAL:alert, in no acute distress and comfortable SKIN: skin color, texture, turgor are normal, no rashes or significant lesions EYES: normal, conjunctiva are pink and non-injected, sclera clear OROPHARYNX:no exudate, no erythema and lips, buccal mucosa, and tongue normal  NECK: supple, no JVD, thyroid normal size, non-tender, without nodularity LYMPH:  no palpable lymphadenopathy in the cervical, axillary or inguinal LUNGS: clear to auscultation with normal respiratory effort, large left anterior chest wall mass noted appears nontender and fixed. HEART: regular rate & rhythm,  no murmurs and no lower extremity edema ABDOMEN: abdomen soft, non-tender, normoactive bowel sounds  Musculoskeletal: no cyanosis of digits and no  clubbing  PSYCH: alert & oriented x 3 with fluent speech NEURO: no focal motor/sensory deficits  LABORATORY DATA:  I have reviewed the data as listed . CBC Latest Ref Rng & Units 04/09/2016 04/03/2016 04/02/2016  WBC 4.0 - 10.3 10e3/uL 0.9(LL) 7.5 11.3(H)  Hemoglobin 13.0 - 17.1 g/dL 9.9(L) 10.1(L) 10.7(L)  Hematocrit 38.4 - 49.9 % 30.5(L) 30.7(L) 33.2(L)  Platelets 140 - 400 10e3/uL 179 400 409(H)   . CBC    Component Value Date/Time   WBC 0.9 (LL) 04/09/2016 0945   WBC 7.5 04/03/2016 0420   RBC 3.68 (L) 04/09/2016 0945   RBC 3.67 (L) 04/03/2016 0420   HGB 9.9 (L) 04/09/2016 0945   HCT 30.5 (L) 04/09/2016 0945   PLT 179 04/09/2016 0945   MCV 82.9 04/09/2016 0945   MCH 26.9 (L) 04/09/2016 0945   MCH 27.5 04/03/2016 0420   MCHC 32.5 04/09/2016 0945   MCHC 32.9 04/03/2016 0420   RDW 19.6 (H) 04/09/2016 0945   LYMPHSABS 0.5 (L) 04/09/2016 0945   MONOABS 0.2 04/09/2016 0945   EOSABS 0.0 04/09/2016 0945   BASOSABS 0.0 04/09/2016 0945    . CMP  Latest Ref Rng & Units 04/09/2016 04/03/2016 04/02/2016  Glucose 70 - 140 mg/dl 122 237(H) 120(H)  BUN 7.0 - 26.0 mg/dL 15.7 18 17   Creatinine 0.7 - 1.3 mg/dL 0.7 0.79 0.55(L)  Sodium 136 - 145 mEq/L 134(L) 136 140  Potassium 3.5 - 5.1 mEq/L 4.1 3.4(L) 3.9  Chloride 101 - 111 mmol/L - 101 104  CO2 22 - 29 mEq/L 26 24 28   Calcium 8.4 - 10.4 mg/dL 9.0 9.0 9.4  Total Protein 6.4 - 8.3 g/dL 5.7(L) - -  Total Bilirubin 0.20 - 1.20 mg/dL 0.95 - -  Alkaline Phos 40 - 150 U/L 77 - -  AST 5 - 34 U/L 16 - -  ALT 0 - 55 U/L 22 - -   .  RADIOGRAPHIC STUDIES: I have personally reviewed the radiological images as listed and agreed with the findings in the report. Dg Fluoro Guided Loc Of Needle/cath Tip For Spinal Inject Lt  Result Date: 03/30/2016 CLINICAL DATA:  Burkitt's lymphoma. Intrathecal methotrexate injection. EXAM: FLUOROSCOPICALLY GUIDED LUMBAR PUNCTURE FOR INTRATHECAL CHEMOTHERAPY TECHNIQUE: Informed consent was obtained from the patient prior to the procedure, including potential complications of headache, allergy, and pain. A 'time out' was performed. With the patient prone, the lower back was prepped with Betadine. 1% Lidocaine was used for local anesthesia. Lumbar puncture was performed at the L3-4 using a 20 gauge needle with return of clear CSF. 10 mL of methotrexate was injected into the subarachnoid space. The patient tolerated the procedure well without apparent complication. FLUOROSCOPY TIME:  24 seconds IMPRESSION: Intrathecal injection of chemotherapy without complication Electronically Signed   By: Rolm Baptise M.D.   On: 03/30/2016 12:55    ASSESSMENT & PLAN:   65 year old Caucasian male with  #1 Stage IVB Burkitt's lymphoma with t(8;14) translocation This appears to be involving the sacrum causing sacral and root involvement with possible neurogenic bladder. PET CT scan as noted above shows extensive involvement with lymphoma. CSF negative for involvement. Bone marrow  biopsy negative for involvement by lymphoma. PET/CT scan after 3 cycles of EPOCH-R  on 03/02/2016  showed significant interval metabolic response.  Patient has completed cycle 5 of EPOCH-R and tolerated it well with no prohibitive toxicities. Grade 1 fatigue.  He has had some episodes of fevers including a periodontal abscess,  non-neutropenic fevers - unclear etiology,  recent Escherichia coli urinary tract infection x 2 and Clostridium difficile diarrhea .  #2 normocytic anemia likely related to chemotherapy.  No evidence of bleeding. Improving hemoglobin  #3 Escherichia coli urinary tract infection - recurrent completed antibiotics. currently on Bactrim prophylaxis to avoid recurrent infections especially in the setting of neutropenia .  #4 diarrhea-C. difficile colitis - has completed 14 days of treatment. Currently on vancomycin twice daily for prophylaxis.  #5 neutropenia due to recent chemotherapy. Anticipated this will resolve soon with his neulasta.  Plan - no clinical evidence of lymphoma progression at this time . -Counseled the patient again extensively about neutropenic precautions and to seek immediate help in the emergency room for any fevers. -Continue prophylactic Bactrim -Continue preventative oral vancomycin for C. Difficile while on active chemotherapy. -He will return to clinic on 04/15/2016 for reassessment of his blood counts prior to scheduling his admission for his sixth and final cycle of EPOCH-R on 04/19/2016. - We shall plan to admit the patient on Monday 04/19/2016 for cycle 6 of EPOCH-R chemotherapy with intrathecal methotrexate day 1 of his cycle by IR (for CNS prophylaxis).  -Peridex mouth washes 2 times a day for a week and then twice daily. -He will hold aspirin and Plavix one week prior to his planned admission date for lumbar puncture.  #4 left sacral ala and S1 fracture. L5-S1 and S1-S2 left-sided nerve root compression. #5 neoplasm related pain -  well-controlled.  Minimal use of oxycodone. Plan -Pain well controlled. - when necessary oxycodone for intermittent neoplasm related pain. -Senna S and MiraLAX as needed.  #4 Hypertension, dyslipidemia, peripheral arterial disease -Continue management as per primary care physician.  #5 ex-smoker with 80-pack-year history of smoking.   I spent 20 minutes counseling the patient face to face. The total time spent in the appointment was 25 minutes and more than 50% was on counseling and direct patient cares and co-ordinating inpatient hospitalization logistics    Sullivan Lone MD [place] AAHIVMS [facility] Hematology/Oncology Physician [facility]  (Office):       [phone] (Work cell):  [phone] (Fax):           [phone]

## 2016-04-15 ENCOUNTER — Encounter: Payer: Self-pay | Admitting: Hematology

## 2016-04-15 ENCOUNTER — Other Ambulatory Visit (HOSPITAL_BASED_OUTPATIENT_CLINIC_OR_DEPARTMENT_OTHER): Payer: PRIVATE HEALTH INSURANCE

## 2016-04-15 ENCOUNTER — Telehealth: Payer: Self-pay

## 2016-04-15 ENCOUNTER — Telehealth: Payer: Self-pay | Admitting: Hematology

## 2016-04-15 ENCOUNTER — Ambulatory Visit (HOSPITAL_BASED_OUTPATIENT_CLINIC_OR_DEPARTMENT_OTHER): Payer: PRIVATE HEALTH INSURANCE | Admitting: Hematology

## 2016-04-15 VITALS — BP 121/72 | HR 85 | Temp 98.5°F | Resp 19 | Wt 176.8 lb

## 2016-04-15 DIAGNOSIS — C8378 Burkitt lymphoma, lymph nodes of multiple sites: Secondary | ICD-10-CM

## 2016-04-15 DIAGNOSIS — D649 Anemia, unspecified: Secondary | ICD-10-CM | POA: Diagnosis not present

## 2016-04-15 DIAGNOSIS — D701 Agranulocytosis secondary to cancer chemotherapy: Secondary | ICD-10-CM | POA: Diagnosis not present

## 2016-04-15 DIAGNOSIS — G893 Neoplasm related pain (acute) (chronic): Secondary | ICD-10-CM | POA: Diagnosis not present

## 2016-04-15 DIAGNOSIS — I10 Essential (primary) hypertension: Secondary | ICD-10-CM

## 2016-04-15 LAB — COMPREHENSIVE METABOLIC PANEL
ALK PHOS: 109 U/L (ref 40–150)
ALT: 26 U/L (ref 0–55)
AST: 22 U/L (ref 5–34)
Albumin: 3.5 g/dL (ref 3.5–5.0)
Anion Gap: 11 mEq/L (ref 3–11)
BILIRUBIN TOTAL: 0.35 mg/dL (ref 0.20–1.20)
BUN: 9 mg/dL (ref 7.0–26.0)
CALCIUM: 9.5 mg/dL (ref 8.4–10.4)
CO2: 24 meq/L (ref 22–29)
CREATININE: 0.8 mg/dL (ref 0.7–1.3)
Chloride: 103 mEq/L (ref 98–109)
EGFR: >90 mL/min/{1.73_m2} (ref >90)
GLUCOSE: 131 mg/dL (ref 70–140)
Potassium: 4.8 mEq/L (ref 3.5–5.1)
Sodium: 137 mEq/L (ref 136–145)
Total Protein: 6.6 g/dL (ref 6.4–8.3)

## 2016-04-15 LAB — CBC & DIFF AND RETIC
BASO%: 1 % (ref 0.0–2.0)
BASOS ABS: 0.1 10*3/uL (ref 0.0–0.1)
EOS%: 0.4 % (ref 0.0–7.0)
Eosinophils Absolute: 0.1 10*3/uL (ref 0.0–0.5)
HEMATOCRIT: 32.4 % — AB (ref 38.4–49.9)
HGB: 10.5 g/dL — ABNORMAL LOW (ref 13.0–17.1)
Immature Retic Fract: 17.1 % — ABNORMAL HIGH (ref 3.00–10.60)
LYMPH%: 9.4 % — ABNORMAL LOW (ref 14.0–49.0)
MCH: 27.7 pg (ref 27.2–33.4)
MCHC: 32.4 g/dL (ref 32.0–36.0)
MCV: 85.5 fL (ref 79.3–98.0)
MONO#: 1.2 10*3/uL — ABNORMAL HIGH (ref 0.1–0.9)
MONO%: 10.7 % (ref 0.0–14.0)
NEUT#: 8.9 10*3/uL — ABNORMAL HIGH (ref 1.5–6.5)
NEUT%: 78.5 % — AB (ref 39.0–75.0)
PLATELETS: 153 10*3/uL (ref 140–400)
RBC: 3.79 10*6/uL — ABNORMAL LOW (ref 4.20–5.82)
RDW: 20.4 % — AB (ref 11.0–14.6)
RETIC %: 4.36 % — AB (ref 0.80–1.80)
Retic Ct Abs: 165.24 10*3/uL — ABNORMAL HIGH (ref 34.80–93.90)
WBC: 11.3 10*3/uL — ABNORMAL HIGH (ref 4.0–10.3)
lymph#: 1.1 10*3/uL (ref 0.9–3.3)

## 2016-04-15 LAB — TECHNOLOGIST REVIEW

## 2016-04-15 NOTE — Telephone Encounter (Signed)
Appointments scheduled per 04/15/16 los. Copy of AVS report and appointment schedule was given to patient, per 04/15/16 los.

## 2016-04-15 NOTE — Telephone Encounter (Signed)
Called and informed pt disability forms were ready to be picked up per his request. Pt verbalized understanding and will pick up Monday 04/19/16

## 2016-04-19 ENCOUNTER — Inpatient Hospital Stay (HOSPITAL_COMMUNITY)
Admission: RE | Admit: 2016-04-19 | Discharge: 2016-04-23 | DRG: 847 | Disposition: A | Discharge status: Home / Self Care | Payer: PRIVATE HEALTH INSURANCE | Source: Ambulatory Visit | Attending: Hematology | Admitting: Hematology

## 2016-04-19 ENCOUNTER — Inpatient Hospital Stay (HOSPITAL_COMMUNITY): Payer: PRIVATE HEALTH INSURANCE

## 2016-04-19 ENCOUNTER — Encounter (HOSPITAL_COMMUNITY): Payer: Self-pay

## 2016-04-19 DIAGNOSIS — D6481 Anemia due to antineoplastic chemotherapy: Secondary | ICD-10-CM | POA: Diagnosis present

## 2016-04-19 DIAGNOSIS — T451X5A Adverse effect of antineoplastic and immunosuppressive drugs, initial encounter: Secondary | ICD-10-CM | POA: Diagnosis present

## 2016-04-19 DIAGNOSIS — I10 Essential (primary) hypertension: Secondary | ICD-10-CM | POA: Diagnosis present

## 2016-04-19 DIAGNOSIS — T380X5A Adverse effect of glucocorticoids and synthetic analogues, initial encounter: Secondary | ICD-10-CM | POA: Diagnosis present

## 2016-04-19 DIAGNOSIS — Z87440 Personal history of urinary (tract) infections: Secondary | ICD-10-CM | POA: Diagnosis not present

## 2016-04-19 DIAGNOSIS — Z5111 Encounter for antineoplastic chemotherapy: Secondary | ICD-10-CM | POA: Diagnosis present

## 2016-04-19 DIAGNOSIS — D72829 Elevated white blood cell count, unspecified: Secondary | ICD-10-CM | POA: Diagnosis present

## 2016-04-19 DIAGNOSIS — C8378 Burkitt lymphoma, lymph nodes of multiple sites: Secondary | ICD-10-CM

## 2016-04-19 DIAGNOSIS — Z87891 Personal history of nicotine dependence: Secondary | ICD-10-CM

## 2016-04-19 DIAGNOSIS — I739 Peripheral vascular disease, unspecified: Secondary | ICD-10-CM | POA: Diagnosis present

## 2016-04-19 LAB — CBC WITH DIFFERENTIAL/PLATELET
Basophils Absolute: 0.1 10*3/uL (ref 0.0–0.1)
Basophils Relative: 1 %
EOS PCT: 0 %
Eosinophils Absolute: 0 10*3/uL (ref 0.0–0.7)
HEMATOCRIT: 32.1 % — AB (ref 39.0–52.0)
HEMOGLOBIN: 10.3 g/dL — AB (ref 13.0–17.0)
LYMPHS ABS: 1.3 10*3/uL (ref 0.7–4.0)
LYMPHS PCT: 13 %
MCH: 28 pg (ref 26.0–34.0)
MCHC: 32.1 g/dL (ref 30.0–36.0)
MCV: 87.2 fL (ref 78.0–100.0)
MONOS PCT: 5 %
Monocytes Absolute: 0.5 10*3/uL (ref 0.1–1.0)
Neutro Abs: 7.9 10*3/uL — ABNORMAL HIGH (ref 1.7–7.7)
Neutrophils Relative %: 81 %
Platelets: 286 10*3/uL (ref 150–400)
RBC: 3.68 MIL/uL — AB (ref 4.22–5.81)
RDW: 20.2 % — AB (ref 11.5–15.5)
WBC: 9.8 10*3/uL (ref 4.0–10.5)

## 2016-04-19 LAB — COMPREHENSIVE METABOLIC PANEL
ALBUMIN: 3.9 g/dL (ref 3.5–5.0)
ALK PHOS: 89 U/L (ref 38–126)
ALT: 26 U/L (ref 17–63)
ANION GAP: 6 (ref 5–15)
AST: 22 U/L (ref 15–41)
BUN: 14 mg/dL (ref 6–20)
CALCIUM: 8.9 mg/dL (ref 8.9–10.3)
CO2: 27 mmol/L (ref 22–32)
Chloride: 103 mmol/L (ref 101–111)
Creatinine, Ser: 0.75 mg/dL (ref 0.61–1.24)
GFR calc Af Amer: >60 mL/min (ref >60)
GFR calc non Af Amer: >60 mL/min (ref >60)
GLUCOSE: 163 mg/dL — AB (ref 65–99)
Potassium: 4.1 mmol/L (ref 3.5–5.1)
SODIUM: 136 mmol/L (ref 135–145)
Total Bilirubin: 0.6 mg/dL (ref 0.3–1.2)
Total Protein: 6.3 g/dL — ABNORMAL LOW (ref 6.5–8.1)

## 2016-04-19 LAB — PHOSPHORUS: Phosphorus: 3.2 mg/dL (ref 2.5–4.6)

## 2016-04-19 LAB — CSF CELL COUNT WITH DIFFERENTIAL
RBC COUNT CSF: 1 /mm3 — AB
Tube #: 4
WBC CSF: 0 /mm3 (ref 0–5)

## 2016-04-19 LAB — APTT: aPTT: 26 seconds (ref 24–36)

## 2016-04-19 LAB — MAGNESIUM: Magnesium: 2 mg/dL (ref 1.7–2.4)

## 2016-04-19 MED ORDER — SENNOSIDES-DOCUSATE SODIUM 8.6-50 MG PO TABS: 2.0000 | ORAL_TABLET | Freq: Every evening | ORAL | Status: DC | PRN

## 2016-04-19 MED ORDER — LORAZEPAM 0.5 MG PO TABS
0.5000 mg | ORAL_TABLET | Freq: Four times a day (QID) | ORAL | Status: DC | PRN
  Administered 2016-04-19 – 2016-04-20 (×2): 0.5 mg via ORAL
  Filled 2016-04-19 (×2): qty 1

## 2016-04-19 MED ORDER — VANCOMYCIN 50 MG/ML ORAL SOLUTION
125.0000 mg | Freq: Two times a day (BID) | ORAL | Status: DC
  Administered 2016-04-19 – 2016-04-23 (×9): 125 mg via ORAL
  Filled 2016-04-19 (×10): qty 2.5

## 2016-04-19 MED ORDER — SODIUM CHLORIDE 0.9% FLUSH: 3.0000 mL | INTRAVENOUS | Status: DC | PRN

## 2016-04-19 MED ORDER — ALTEPLASE 2 MG IJ SOLR: 2.0000 mg | Freq: Once | INTRAMUSCULAR | Status: DC | PRN

## 2016-04-19 MED ORDER — COLD PACK MISC ONCOLOGY: 1.0000 | Freq: Once | Status: DC | PRN

## 2016-04-19 MED ORDER — AMLODIPINE BESYLATE 10 MG PO TABS
10.0000 mg | ORAL_TABLET | Freq: Every evening | ORAL | Status: DC
  Administered 2016-04-19 – 2016-04-22 (×4): 10 mg via ORAL
  Filled 2016-04-19 (×4): qty 1

## 2016-04-19 MED ORDER — OXYCODONE HCL 5 MG PO TABS
5.0000 mg | ORAL_TABLET | Freq: Four times a day (QID) | ORAL | Status: DC | PRN
Start: 2016-04-19 — End: 2016-04-23

## 2016-04-19 MED ORDER — LISINOPRIL 20 MG PO TABS
20.0000 mg | ORAL_TABLET | Freq: Every evening | ORAL | Status: DC
  Administered 2016-04-19 – 2016-04-22 (×4): 20 mg via ORAL
  Filled 2016-04-19 (×4): qty 1

## 2016-04-19 MED ORDER — POLYETHYLENE GLYCOL 3350 17 G PO PACK: 17.0000 g | PACK | Freq: Every day | ORAL | Status: DC | PRN

## 2016-04-19 MED ORDER — HOT PACK MISC ONCOLOGY: 1.0000 | Freq: Once | Status: DC | PRN

## 2016-04-19 MED ORDER — PREDNISONE 50 MG PO TABS
60.0000 mg/m2/d | ORAL_TABLET | Freq: Every day | ORAL | Status: AC
  Administered 2016-04-19 – 2016-04-23 (×5): 125.5 mg via ORAL
  Filled 2016-04-19 (×5): qty 3

## 2016-04-19 MED ORDER — SODIUM CHLORIDE 0.9 % IV SOLN
INTRAVENOUS | Status: DC
  Administered 2016-04-19 – 2016-04-21 (×2): via INTRAVENOUS

## 2016-04-19 MED ORDER — LIDOCAINE HCL 1 % IJ SOLN
INTRAMUSCULAR | Status: AC
  Filled 2016-04-19: qty 20

## 2016-04-19 MED ORDER — MIRTAZAPINE 15 MG PO TABS: 15.0000 mg | ORAL_TABLET | Freq: Every evening | ORAL | Status: DC | PRN

## 2016-04-19 MED ORDER — SODIUM CHLORIDE 0.9 % IV SOLN
Freq: Once | INTRAVENOUS | Status: AC
  Administered 2016-04-19: 18 mg via INTRAVENOUS
  Filled 2016-04-19: qty 4

## 2016-04-19 MED ORDER — MAGIC MOUTHWASH W/LIDOCAINE
5.0000 mL | Freq: Four times a day (QID) | ORAL | Status: DC | PRN
  Filled 2016-04-19: qty 5

## 2016-04-19 MED ORDER — HEPARIN SOD (PORK) LOCK FLUSH 100 UNIT/ML IV SOLN: 500.0000 [IU] | Freq: Once | INTRAVENOUS | Status: DC | PRN

## 2016-04-19 MED ORDER — HEPARIN SOD (PORK) LOCK FLUSH 100 UNIT/ML IV SOLN
250.0000 [IU] | Freq: Once | INTRAVENOUS | Status: DC | PRN
  Filled 2016-04-19: qty 5

## 2016-04-19 MED ORDER — VINCRISTINE SULFATE CHEMO INJECTION 1 MG/ML
Freq: Once | INTRAVENOUS | Status: AC
  Administered 2016-04-19: 16:00:00 via INTRAVENOUS
  Filled 2016-04-19 (×2): qty 10

## 2016-04-19 MED ORDER — SODIUM CHLORIDE 0.9 % IJ SOLN
Freq: Once | INTRAMUSCULAR | Status: AC
  Administered 2016-04-19: 14:00:00 via INTRATHECAL
  Filled 2016-04-19: qty 0.48

## 2016-04-19 MED ORDER — SODIUM CHLORIDE 0.9% FLUSH: 10.0000 mL | INTRAVENOUS | Status: DC | PRN

## 2016-04-19 NOTE — Progress Notes (Signed)
Calculation and dosage of Doxorubicin, Etoposide and Vincristine verified by 2nd chemo certified RN Nancy Marus.

## 2016-04-19 NOTE — Progress Notes (Signed)
Successful intrathecal chemotherapy injection at L5/1.  No evident complication.  8cc CSF sent to lab.  JWatts MD Radiology

## 2016-04-20 LAB — CBC
HCT: 32.2 % — ABNORMAL LOW (ref 39.0–52.0)
HEMOGLOBIN: 10.4 g/dL — AB (ref 13.0–17.0)
MCH: 28 pg (ref 26.0–34.0)
MCHC: 32.3 g/dL (ref 30.0–36.0)
MCV: 86.6 fL (ref 78.0–100.0)
PLATELETS: 314 10*3/uL (ref 150–400)
RBC: 3.72 MIL/uL — ABNORMAL LOW (ref 4.22–5.81)
RDW: 19.6 % — AB (ref 11.5–15.5)
WBC: 18.2 10*3/uL — ABNORMAL HIGH (ref 4.0–10.5)

## 2016-04-20 LAB — BASIC METABOLIC PANEL
ANION GAP: 7 (ref 5–15)
BUN: 15 mg/dL (ref 6–20)
CALCIUM: 9.3 mg/dL (ref 8.9–10.3)
CO2: 25 mmol/L (ref 22–32)
Chloride: 105 mmol/L (ref 101–111)
Creatinine, Ser: 0.59 mg/dL — ABNORMAL LOW (ref 0.61–1.24)
GLUCOSE: 186 mg/dL — AB (ref 65–99)
Potassium: 4.7 mmol/L (ref 3.5–5.1)
Sodium: 137 mmol/L (ref 135–145)

## 2016-04-20 MED ORDER — ASPIRIN EC 81 MG PO TBEC
81.0000 mg | DELAYED_RELEASE_TABLET | Freq: Every evening | ORAL | Status: DC
  Administered 2016-04-20 – 2016-04-22 (×3): 81 mg via ORAL
  Filled 2016-04-20 (×3): qty 1

## 2016-04-20 MED ORDER — VINCRISTINE SULFATE CHEMO INJECTION 1 MG/ML
Freq: Once | INTRAVENOUS | Status: AC
  Administered 2016-04-20: 14:00:00 via INTRAVENOUS
  Filled 2016-04-20: qty 10

## 2016-04-20 MED ORDER — ENOXAPARIN SODIUM 30 MG/0.3ML ~~LOC~~ SOLN
30.0000 mg | SUBCUTANEOUS | Status: DC
  Administered 2016-04-21 – 2016-04-23 (×3): 30 mg via SUBCUTANEOUS
  Filled 2016-04-20 (×3): qty 0.3

## 2016-04-20 MED ORDER — SODIUM CHLORIDE 0.9 % IV SOLN
Freq: Once | INTRAVENOUS | Status: AC
  Administered 2016-04-20: 8 mg via INTRAVENOUS
  Filled 2016-04-20: qty 4

## 2016-04-20 NOTE — Progress Notes (Signed)
Chemotherapy dosages and calculations checked with another chemo certified nurse, Kirkland Hun RN.

## 2016-04-20 NOTE — H&P (Signed)
[name]    HEMATOLOGY/ONCOLOGY CONSULTATION NOTE  Date of Service: 04/20/2016  Patient Care Team: Ernestene Kiel, MD as PCP - General (Internal Medicine) [name], MD as Consulting Physician (Orthopedic Surgery)  CHIEF COMPLAINTS/PURPOSE OF CONSULTATION:  Burkitt's lymphoma admitted for sixth cycle of EPOCH-R chemotherapy   HISTORY OF PRESENTING ILLNESS:   Erik Vaughn is a wonderful 65 y.o. male whowas recently seen in clinic on 04/15/2016  and is being admitted for his 6th cycle of EPOCH-R for Burkitt's lymphoma (with t(8;14) translocation).  He is doing well and did not develop any neutropenic fever with his previous cycle of treatment. Was on bactrim prophylaxis for previous episode of neutropenic fevers. Continues to be on PO vancomycin for c diff prophylaxis while on chemotherapy. He notes no acute new symptoms and feels okay with proceeding with his planned treatment. No other focal symptoms at this time. No new lymphadenopathy. No abdominal pain. No chest pain or shortness of breath.   MEDICAL HISTORY:  Past Medical History:  Diagnosis Date  . Cancer ([place])    Burkett Lymphoma  . Hypertension   . PAD (peripheral artery disease) ([facility]) left leg    SURGICAL HISTORY: Past Surgical History:  Procedure Laterality Date  . IR GENERIC HISTORICAL  12/31/2015   IR FLUORO GUIDE CV LINE RIGHT 12/31/2015 Arne Cleveland, MD [facility]  . IR GENERIC HISTORICAL  12/31/2015   IR US GUIDE VASC ACCESS RIGHT 12/31/2015 Arne Cleveland, MD [facility]  . left salivary Left    removed    SOCIAL HISTORY: Social History   Social History  . Marital status: Married    Spouse name: N/A  . Number of children: N/A  . Years of education: N/A   Occupational History  . meter specialist    Social History Main Topics  . Smoking status: Former Smoker    Packs/day: 2.00    Years: 16.00    Quit date: 12/22/2005  . Smokeless tobacco: Never Used  . Alcohol use Yes     Comment:  occasionally  . Drug use: No  . Sexual activity: No   Other Topics Concern  . Not on file   Social History Narrative  . No narrative on file    FAMILY HISTORY: Family History  Problem Relation Age of Onset  . Alzheimer's disease Father 72    ALLERGIES:  has No Known Allergies.  MEDICATIONS:  Current Facility-Administered Medications  Medication Dose Route Frequency Provider Last Rate Last Dose  . 0.9 %  sodium chloride infusion   Intravenous Continuous Brunetta Genera, MD 20 mL/hr at 04/20/16 0212    . alteplase (CATHFLO ACTIVASE) injection 2 mg  2 mg Intracatheter Once PRN Brunetta Genera, MD      . amLODipine (NORVASC) tablet 10 mg  10 mg Oral QPM Brunetta Genera, MD   10 mg at 04/19/16 1803  . Cold Pack 1 packet  1 packet Topical Once PRN Brunetta Genera, MD      . DOXOrubicin (ADRIAMYCIN) 20 mg, etoposide (VEPESID) 104 mg, vinCRIStine (ONCOVIN) 0.8 mg in sodium chloride 0.9 % 600 mL chemo infusion   Intravenous Once Brunetta Genera, MD 28 mL/hr at 04/20/16 0212    . DOXOrubicin (ADRIAMYCIN) 20 mg, etoposide (VEPESID) 104 mg, vinCRIStine (ONCOVIN) 0.8 mg in sodium chloride 0.9 % 600 mL chemo infusion   Intravenous Once Brunetta Genera, MD      . heparin lock flush 100 unit/mL  500 Units Intracatheter Once PRN Cloria Spring  Irene Limbo, MD      . heparin lock flush 100 unit/mL  250 Units Intracatheter Once PRN Brunetta Genera, MD      . Hot Pack 1 packet  1 packet Topical Once PRN Brunetta Genera, MD      . lisinopril (PRINIVIL,ZESTRIL) tablet 20 mg  20 mg Oral QPM Brunetta Genera, MD   20 mg at 04/19/16 1803  . LORazepam (ATIVAN) tablet 0.5 mg  0.5 mg Oral Q6H PRN Brunetta Genera, MD   0.5 mg at 04/19/16 2117  . magic mouthwash w/lidocaine  5 mL Oral QID PRN Brunetta Genera, MD      . mirtazapine (REMERON) tablet 15 mg  15 mg Oral QHS PRN Brunetta Genera, MD      . ondansetron (ZOFRAN) 8 mg, dexamethasone (DECADRON) 10 mg in sodium  chloride 0.9 % 50 mL IVPB   Intravenous Once Brunetta Genera, MD      . oxyCODONE (Oxy IR/ROXICODONE) immediate release tablet 5-10 mg  5-10 mg Oral Q6H PRN Brunetta Genera, MD      . polyethylene glycol (MIRALAX / GLYCOLAX) packet 17 g  17 g Oral Daily PRN Brunetta Genera, MD      . predniSONE (DELTASONE) tablet 125.5 mg  60 mg/m2/day (Treatment Plan Recorded) Oral QAC breakfast Brunetta Genera, MD   125.5 mg at 04/20/16 0806  . senna-docusate (Senokot-S) tablet 2 tablet  2 tablet Oral QHS PRN Brunetta Genera, MD      . sodium chloride flush (NS) 0.9 % injection 10 mL  10 mL Intracatheter PRN Brunetta Genera, MD      . sodium chloride flush (NS) 0.9 % injection 3 mL  3 mL Intravenous PRN Brunetta Genera, MD      . vancomycin (VANCOCIN) 50 mg/mL oral solution 125 mg  125 mg Oral Q12H Brunetta Genera, MD   125 mg at 04/20/16 1884    REVIEW OF SYSTEMS:    10 Point review of Systems was done is negative except as noted above.  PHYSICAL EXAMINATION: ECOG PERFORMANCE STATUS: 1 - Symptomatic but completely ambulatory  . Vitals:   04/19/16 2146 04/20/16 0430  BP: 129/71 134/81  Pulse: (!) 106 (!) 101  Resp: 16 18  Temp: 97.9 F (36.6 C) 97.8 F (36.6 C)   Filed Weights   04/19/16 0922  Weight: 176 lb (79.8 kg)   .Body mass index is 23.87 kg/m.  GENERAL:alert, in no acute distress and comfortable SKIN: skin color, texture, turgor are normal, no rashes or significant lesions EYES: normal, conjunctiva are pink and non-injected, sclera clear OROPHARYNX:no exudate, no erythema and lips, buccal mucosa, and tongue normal  NECK: supple, no JVD, thyroid normal size, non-tender, without nodularity LYMPH:  no palpable lymphadenopathy in the cervical, axillary or inguinal LUNGS: clear to auscultation with normal respiratory effort HEART: regular rate & rhythm,  no murmurs and no lower extremity edema ABDOMEN: abdomen soft, non-tender, normoactive bowel sounds    Musculoskeletal: no cyanosis of digits and no clubbing  PSYCH: alert & oriented x 3 with fluent speech NEURO: no focal motor/sensory deficits  LABORATORY DATA:  I have reviewed the data as listed  . CBC Latest Ref Rng & Units 04/19/2016 04/15/2016  WBC 4.0 - 10.5 K/uL 9.8 11.3(H)  Hemoglobin 13.0 - 17.0 g/dL 10.3(L) 10.5(L)  Hematocrit 39.0 - 52.0 % 32.1(L) 32.4(L)  Platelets 150 - 400 K/uL 286 153    . CMP Latest Ref  Rng & Units 04/19/2016 04/15/2016  Glucose 65 - 99 mg/dL 163(H) 131  BUN 6 - 20 mg/dL 14 9.0  Creatinine 0.61 - 1.24 mg/dL 0.75 0.8  Sodium 135 - 145 mmol/L 136 137  Potassium 3.5 - 5.1 mmol/L 4.1 4.8  Chloride 101 - 111 mmol/L 103 -  CO2 22 - 32 mmol/L 27 24  Calcium 8.9 - 10.3 mg/dL 8.9 9.5  Total Protein 6.5 - 8.1 g/dL 6.3(L) 6.6  Total Bilirubin 0.3 - 1.2 mg/dL 0.6 0.35  Alkaline Phos 38 - 126 U/L 89 109  AST 15 - 41 U/L 22 22  ALT 17 - 63 U/L 26 26     RADIOGRAPHIC STUDIES: I have personally reviewed the radiological images as listed and agreed with the findings in the report. Dg Fluoro Guided Loc Of Needle/cath Tip For Spinal Inject Rt  Result Date: 04/19/2016 CLINICAL DATA:  Burkitt's lymphoma. Prophylactic intrathecal chemotherapy. EXAM: FLUOROSCOPICALLY GUIDED LUMBAR PUNCTURE FOR INTRATHECAL CHEMOTHERAPY FLUOROSCOPY TIME:  Fluoroscopy Time:  18 seconds Radiation Exposure Index (if provided by the fluoroscopic device): 2.65 mGy exposure Number of Acquired Spot Images: 0 PROCEDURE: Informed consent was obtained from the patient prior to the procedure. With the patient prone, the lower back was prepped with Betadine. 1% Lidocaine was used for local anesthesia. Lumbar puncture was performed at the L5-S1 level using a 20 gauge needle with return of clear CSF. 8 ml of CSF were obtained for laboratory studies. The chemotherapy sent from pharmacy and labeled with the patient's name and date of birth was slowly injected into the subarachnoid space. The patient  tolerated the procedure well and there were no apparent complications. IMPRESSION: Successful intrathecal injection of chemotherapy. Electronically Signed   By: Monte Fantasia M.D.   On: 04/19/2016 14:56   Dg Fluoro Guided Loc Of Needle/cath Tip For Spinal Inject Lt  Result Date: 03/30/2016 CLINICAL DATA:  Burkitt's lymphoma. Intrathecal methotrexate injection. EXAM: FLUOROSCOPICALLY GUIDED LUMBAR PUNCTURE FOR INTRATHECAL CHEMOTHERAPY TECHNIQUE: Informed consent was obtained from the patient prior to the procedure, including potential complications of headache, allergy, and pain. A 'time out' was performed. With the patient prone, the lower back was prepped with Betadine. 1% Lidocaine was used for local anesthesia. Lumbar puncture was performed at the L3-4 using a 20 gauge needle with return of clear CSF. 10 mL of methotrexate was injected into the subarachnoid space. The patient tolerated the procedure well without apparent complication. FLUOROSCOPY TIME:  24 seconds IMPRESSION: Intrathecal injection of chemotherapy without complication Electronically Signed   By: Rolm Baptise M.D.   On: 03/30/2016 12:55    ASSESSMENT & PLAN:   1) Stage IVB Burkitt's lymphoma with t(8;14) translocation diagnosed 12/23/2015. This appears to be involving the sacrum causing sacral and root involvement with possible neurogenic bladder. PET CT scan as noted above shows extensive involvement with lymphoma. CSF negative for involvement. Bone marrow biopsy negative for involvement by lymphoma.   CURRENT TREATMENT The patient has completed 5cycles of EPOCH -R and 3 IT methotrexate doses  Planned treatment 6 cycles of EPOCH-R  IT methotrexate x 4 doses starting with cycle 3 for CNS prophylaxis  PLAN -he is labs are stable and blood count are stable. -His appropriate to proceed with his 6th cycle of EPOCH-R starting today. Hope to finish by Friday noon so he could get his neulasta on sat around noon in infusion  center -He will received his 4th dose of intrathecal methotrexate today by IR. Burnis Medin continue the same supportive medications. -Rituxan on  day 5 -Outpatient Neulasta on day 6 on Saturday. -We'll continue to monitor daily CBC and CMP.  2) C diff diarrhea completed 14 days of treatment with oral vancomycin  Plan -continue prophylactic po vancomycin till 3weeks post chemotherapy -Would avoid probiotics while on chemotherapy.  3) Recurrent UTI -Given his recurrent UTI will place on prophylactic Bactrim again on discharge.  3) DVTprophylaxis - TEDS and SCD . We'll start Lovenox 24 hours after lumbar puncture .  4) HTN -continue outpatient medications   5) PAD - has been on outpatient aspirin and Plavix . Was held for his planned lumbar puncture . We'll restart aspirin tomorrow if no issues with LP and Plavix thereafter .   DISPO - Will plan to discharge patient on Friday after completion of chemotherapy. He has been scheduled to be seen in clinic with me in 4 weeks with PET/CT and labs   All of the patients questions were answered with apparent satisfaction. The patient knows to call the clinic with any problems, questions or concerns.  I spent 45 minutes counseling the patient face to face. The total time spent in the appointment was 60 minutes and more than 50% was on counseling and direct patient cares.    Sullivan Lone MD [place] AAHIVMS [facility] Hematology/Oncology Physician [facility]  (Office):       [phone] (Work cell):  [phone] (Fax):           [phone]

## 2016-04-20 NOTE — Progress Notes (Signed)
[name]   HEMATOLOGY/ONCOLOGY INPATIENT PROGRESS NOTE  Date of Service: 04/20/2016  Inpatient Attending: .Brunetta Genera, MD   SUBJECTIVE  Patient is C6D2 today. Tolerating chemotherapy well with no acute new concerns. No back pain. No headaches. No other acute new concerns   OBJECTIVE:  NAD  PHYSICAL EXAMINATION: . Vitals:   04/19/16 0922 04/19/16 1535 04/19/16 2146 04/20/16 0430  BP: 121/80 (!) 141/71 129/71 134/81  Pulse: 87 82 (!) 106 (!) 101  Resp: 19 18 16 18   Temp: 97 F (36.1 C) 97.7 F (36.5 C) 97.9 F (36.6 C) 97.8 F (36.6 C)  TempSrc: Oral Oral Oral Oral  SpO2: 100% 97% 97% 100%  Weight: 176 lb (79.8 kg)     Height: 6' (1.829 m)      Filed Weights   04/19/16 1594  Weight: 176 lb (79.8 kg)   .Body mass index is 23.87 kg/m.  GENERAL:alert, in no acute distress and comfortable SKIN: skin color, texture, turgor are normal, no rashes or significant lesions EYES: normal, conjunctiva are pink and non-injected, sclera clear OROPHARYNX:no exudate, no erythema and lips, buccal mucosa, and tongue normal  NECK: supple, no JVD, thyroid normal size, non-tender, without nodularity LYMPH:  no palpable lymphadenopathy in the cervical, axillary or inguinal LUNGS: clear to auscultation with normal respiratory effort HEART: regular rate & rhythm,  no murmurs and no lower extremity edema ABDOMEN: abdomen soft, non-tender, normoactive bowel sounds  Musculoskeletal: no cyanosis of digits and no clubbing  PSYCH: alert & oriented x 3 with fluent speech NEURO: no focal motor/sensory deficits  MEDICAL HISTORY:  Past Medical History:  Diagnosis Date  . Cancer ([place])    Burkett Lymphoma  . Hypertension   . PAD (peripheral artery disease) ([facility]) left leg    SURGICAL HISTORY: Past Surgical History:  Procedure Laterality Date  . IR GENERIC HISTORICAL  12/31/2015   IR FLUORO GUIDE CV LINE RIGHT 12/31/2015 Arne Cleveland, MD [facility]  . IR GENERIC HISTORICAL  12/31/2015   IR US GUIDE VASC ACCESS RIGHT 12/31/2015 Arne Cleveland, MD [facility]  . left salivary Left    removed    SOCIAL HISTORY: Social History   Social History  . Marital status: Married    Spouse name: N/A  . Number of children: N/A  . Years of education: N/A   Occupational History  . meter specialist    Social History Main Topics  . Smoking status: Former Smoker    Packs/day: 2.00    Years: 16.00    Quit date: 12/22/2005  . Smokeless tobacco: Never Used  . Alcohol use Yes     Comment: occasionally  . Drug use: No  . Sexual activity: No   Other Topics Concern  . Not on file   Social History Narrative  . No narrative on file    FAMILY HISTORY: Family History  Problem Relation Age of Onset  . Alzheimer's disease Father 30    ALLERGIES:  has No Known Allergies.  MEDICATIONS:  Scheduled Meds: . amLODipine  10 mg Oral QPM  . DOXOrubicin/vinCRIStine/etoposide CHEMO IV infusion for Inpatient CI   Intravenous Once  . DOXOrubicin/vinCRIStine/etoposide CHEMO IV infusion for Inpatient CI   Intravenous Once  . lisinopril  20 mg Oral QPM  . ondansetron (ZOFRAN) with dexamethasone (DECADRON) IV   Intravenous Once  . predniSONE  60 mg/m2/day (Treatment Plan Recorded) Oral QAC breakfast  . vancomycin  125 mg Oral Q12H   Continuous Infusions: . sodium chloride 20 mL/hr at 04/20/16  0212   PRN Meds:.alteplase, Cold Pack, heparin lock flush, heparin lock flush, Hot Pack, LORazepam, magic mouthwash w/lidocaine, mirtazapine, oxyCODONE, polyethylene glycol, senna-docusate, sodium chloride flush, sodium chloride flush  REVIEW OF SYSTEMS:    10 Point review of Systems was done is negative except as noted above.   LABORATORY DATA:  I have reviewed the data as listed  . CBC Latest Ref Rng & Units 04/20/2016 04/19/2016 04/15/2016  WBC 4.0 - 10.5 K/uL 18.2(H) 9.8 11.3(H)  Hemoglobin 13.0 - 17.0 g/dL 10.4(L) 10.3(L) 10.5(L)  Hematocrit 39.0 - 52.0 % 32.2(L) 32.1(L) 32.4(L)    Platelets 150 - 400 K/uL 314 286 153    . CMP Latest Ref Rng & Units 04/20/2016 04/19/2016 04/15/2016  Glucose 65 - 99 mg/dL 186(H) 163(H) 131  BUN 6 - 20 mg/dL 15 14 9.0  Creatinine 0.61 - 1.24 mg/dL 0.59(L) 0.75 0.8  Sodium 135 - 145 mmol/L 137 136 137  Potassium 3.5 - 5.1 mmol/L 4.7 4.1 4.8  Chloride 101 - 111 mmol/L 105 103 -  CO2 22 - 32 mmol/L 25 27 24   Calcium 8.9 - 10.3 mg/dL 9.3 8.9 9.5  Total Protein 6.5 - 8.1 g/dL - 6.3(L) 6.6  Total Bilirubin 0.3 - 1.2 mg/dL - 0.6 0.35  Alkaline Phos 38 - 126 U/L - 89 109  AST 15 - 41 U/L - 22 22  ALT 17 - 63 U/L - 26 26     RADIOGRAPHIC STUDIES: I have personally reviewed the radiological images as listed and agreed with the findings in the report. Dg Fluoro Guided Loc Of Needle/cath Tip For Spinal Inject Rt  Result Date: 04/19/2016 CLINICAL DATA:  Burkitt's lymphoma. Prophylactic intrathecal chemotherapy. EXAM: FLUOROSCOPICALLY GUIDED LUMBAR PUNCTURE FOR INTRATHECAL CHEMOTHERAPY FLUOROSCOPY TIME:  Fluoroscopy Time:  18 seconds Radiation Exposure Index (if provided by the fluoroscopic device): 2.65 mGy exposure Number of Acquired Spot Images: 0 PROCEDURE: Informed consent was obtained from the patient prior to the procedure. With the patient prone, the lower back was prepped with Betadine. 1% Lidocaine was used for local anesthesia. Lumbar puncture was performed at the L5-S1 level using a 20 gauge needle with return of clear CSF. 8 ml of CSF were obtained for laboratory studies. The chemotherapy sent from pharmacy and labeled with the patient's name and date of birth was slowly injected into the subarachnoid space. The patient tolerated the procedure well and there were no apparent complications. IMPRESSION: Successful intrathecal injection of chemotherapy. Electronically Signed   By: Monte Fantasia M.D.   On: 04/19/2016 14:56   Dg Fluoro Guided Loc Of Needle/cath Tip For Spinal Inject Lt  Result Date: 03/30/2016 CLINICAL DATA:   Burkitt's lymphoma. Intrathecal methotrexate injection. EXAM: FLUOROSCOPICALLY GUIDED LUMBAR PUNCTURE FOR INTRATHECAL CHEMOTHERAPY TECHNIQUE: Informed consent was obtained from the patient prior to the procedure, including potential complications of headache, allergy, and pain. A 'time out' was performed. With the patient prone, the lower back was prepped with Betadine. 1% Lidocaine was used for local anesthesia. Lumbar puncture was performed at the L3-4 using a 20 gauge needle with return of clear CSF. 10 mL of methotrexate was injected into the subarachnoid space. The patient tolerated the procedure well without apparent complication. FLUOROSCOPY TIME:  24 seconds IMPRESSION: Intrathecal injection of chemotherapy without complication Electronically Signed   By: Rolm Baptise M.D.   On: 03/30/2016 12:55    ASSESSMENT & PLAN:   1) Stage IVB Burkitt's lymphoma with t(8;14) translocation diagnosed 12/23/2015. This appears to be involving the sacrum causing  sacral and root involvement with possible neurogenic bladder. PET CT scan as noted above shows extensive involvement with lymphoma. CSF negative for involvement. Bone marrow biopsy negative for involvement by lymphoma.   CURRENT TREATMENT The patient has completed 5cycles of EPOCH -R and 3 IT methotrexate doses  Planned treatment 6 cycles of EPOCH-R  IT methotrexate x 4 doses starting with cycle 3 for CNS prophylaxis  PLAN -tolerated C6D1 and tolerating C6D2 today. No issues after IT MTX -continue chemotherapy as ordered. -We'll continue the same supportive medications. -Rituxan on day 5 -Outpatient Neulasta on day 6 on Saturday 1:30 pm at the cancer center -We'll continue to monitor daily CBC and CMP. -leucocytosis due to steroids.  2) C diff diarrhea completed 14 days of treatment with oral vancomycin  Plan -continue prophylactic po vancomycin till 3weeks post chemotherapy -Would avoid probiotics while on chemotherapy.  3) h/o  Recurrent UTI -Given his recurrent UTI will place on prophylacticBactrim again on discharge.  3) DVTprophylaxis - TEDS and SCD . We'll start Lovenox 24 hours after lumbar puncture .  4) HTN -continue outpatient medications   5) PAD - has been on outpatient aspirin and Plavix . Was held for his planned lumbar puncture . restarted aspirin today -lovenox from tomorrow -plavix to be restarted tommorrow .   DISPO - Will plan to discharge patient on Friday after completion of chemotherapy. He has been scheduled to be seen in clinic with me in 4 weeks with PET/CT and labs  I spent 25 minutes counseling the patient face to face. The total time spent in the appointment was 35 minutes and more than 50% was on counseling and direct patient cares.    Sullivan Lone MD [place] AAHIVMS [facility] Hematology/Oncology Physician [facility]  (Office):       [phone] (Work cell):  [phone] (Fax):           [phone]

## 2016-04-21 LAB — CBC
HCT: 28.6 % — ABNORMAL LOW (ref 39.0–52.0)
Hemoglobin: 9.4 g/dL — ABNORMAL LOW (ref 13.0–17.0)
MCH: 28.7 pg (ref 26.0–34.0)
MCHC: 32.9 g/dL (ref 30.0–36.0)
MCV: 87.2 fL (ref 78.0–100.0)
PLATELETS: 342 10*3/uL (ref 150–400)
RBC: 3.28 MIL/uL — AB (ref 4.22–5.81)
RDW: 20.2 % — ABNORMAL HIGH (ref 11.5–15.5)
WBC: 18.5 10*3/uL — AB (ref 4.0–10.5)

## 2016-04-21 LAB — BASIC METABOLIC PANEL
Anion gap: 7 (ref 5–15)
BUN: 14 mg/dL (ref 6–20)
CALCIUM: 9.3 mg/dL (ref 8.9–10.3)
CHLORIDE: 107 mmol/L (ref 101–111)
CO2: 25 mmol/L (ref 22–32)
CREATININE: 0.65 mg/dL (ref 0.61–1.24)
Glucose, Bld: 171 mg/dL — ABNORMAL HIGH (ref 65–99)
Potassium: 4.1 mmol/L (ref 3.5–5.1)
SODIUM: 139 mmol/L (ref 135–145)

## 2016-04-21 MED ORDER — SODIUM CHLORIDE 0.9% FLUSH: 10.0000 mL | INTRAVENOUS | Status: DC | PRN

## 2016-04-21 MED ORDER — CLOPIDOGREL BISULFATE 75 MG PO TABS
75.0000 mg | ORAL_TABLET | Freq: Every evening | ORAL | Status: DC
  Administered 2016-04-21 – 2016-04-22 (×2): 75 mg via ORAL
  Filled 2016-04-21 (×2): qty 1

## 2016-04-21 MED ORDER — ONDANSETRON HCL 40 MG/20ML IJ SOLN
Freq: Once | INTRAMUSCULAR | Status: AC
  Administered 2016-04-21: 8 mg via INTRAVENOUS
  Filled 2016-04-21: qty 4

## 2016-04-21 MED ORDER — VINCRISTINE SULFATE CHEMO INJECTION 1 MG/ML
Freq: Once | INTRAVENOUS | Status: AC
  Administered 2016-04-21: 12:00:00 via INTRAVENOUS
  Filled 2016-04-21: qty 10

## 2016-04-21 NOTE — Progress Notes (Signed)
Chemo dosages and calculations checked with Laural Benes RN.

## 2016-04-21 NOTE — Progress Notes (Signed)
[name]   HEMATOLOGY/ONCOLOGY INPATIENT PROGRESS NOTE  Date of Service: 04/21/2016  Inpatient Attending: .Brunetta Genera, MD   SUBJECTIVE  Patient is [phone] today. Tolerating chemotherapy well with no acute new concerns. No back pain. No headaches. No fevers/chills. No other acute new concerns   OBJECTIVE:  NAD  PHYSICAL EXAMINATION: . Vitals:   04/20/16 0430 04/20/16 1432 04/20/16 2135 04/21/16 0539  BP: 134/81 (!) 144/65 (!) 113/59 (!) 142/75  Pulse: (!) 101 92 99 62  Resp: 18 17 18 16   Temp: 97.8 F (36.6 C) 97.6 F (36.4 C) (!) 96.9 F (36.1 C) 97.3 F (36.3 C)  TempSrc: Oral Oral Oral Oral  SpO2: 100% 98% 96% 100%  Weight:      Height:       Filed Weights   04/19/16 0922  Weight: 176 lb (79.8 kg)   .Body mass index is 23.87 kg/m.  GENERAL:alert, in no acute distress and comfortable SKIN: skin color, texture, turgor are normal, no rashes or significant lesions EYES: normal, conjunctiva are pink and non-injected, sclera clear OROPHARYNX:no exudate, no erythema and lips, buccal mucosa, and tongue normal  NECK: supple, no JVD, thyroid normal size, non-tender, without nodularity LYMPH:  no palpable lymphadenopathy in the cervical, axillary or inguinal LUNGS: clear to auscultation with normal respiratory effort HEART: regular rate & rhythm,  no murmurs and no lower extremity edema ABDOMEN: abdomen soft, non-tender, normoactive bowel sounds  Musculoskeletal: no cyanosis of digits and no clubbing  PSYCH: alert & oriented x 3 with fluent speech NEURO: no focal motor/sensory deficits  MEDICAL HISTORY:  Past Medical History:  Diagnosis Date  . Cancer ([place])    Burkett Lymphoma  . Hypertension   . PAD (peripheral artery disease) ([facility]) left leg    SURGICAL HISTORY: Past Surgical History:  Procedure Laterality Date  . IR GENERIC HISTORICAL  12/31/2015   IR FLUORO GUIDE CV LINE RIGHT 12/31/2015 Arne Cleveland, MD [facility]  . IR GENERIC HISTORICAL  12/31/2015   IR US GUIDE VASC ACCESS RIGHT 12/31/2015 Arne Cleveland, MD [facility]  . left salivary Left    removed    SOCIAL HISTORY: Social History   Social History  . Marital status: Married    Spouse name: N/A  . Number of children: N/A  . Years of education: N/A   Occupational History  . meter specialist    Social History Main Topics  . Smoking status: Former Smoker    Packs/day: 2.00    Years: 16.00    Quit date: 12/22/2005  . Smokeless tobacco: Never Used  . Alcohol use Yes     Comment: occasionally  . Drug use: No  . Sexual activity: No   Other Topics Concern  . Not on file   Social History Narrative  . No narrative on file    FAMILY HISTORY: Family History  Problem Relation Age of Onset  . Alzheimer's disease Father 89    ALLERGIES:  has No Known Allergies.  MEDICATIONS:  Scheduled Meds: . amLODipine  10 mg Oral QPM  . aspirin EC  81 mg Oral QPM  . DOXOrubicin/vinCRIStine/etoposide CHEMO IV infusion for Inpatient CI   Intravenous Once  . DOXOrubicin/vinCRIStine/etoposide CHEMO IV infusion for Inpatient CI   Intravenous Once  . enoxaparin (LOVENOX) injection  30 mg Subcutaneous Q24H  . lisinopril  20 mg Oral QPM  . ondansetron (ZOFRAN) with dexamethasone (DECADRON) IV   Intravenous Once  . predniSONE  60 mg/m2/day (Treatment Plan Recorded) Oral QAC breakfast  .  vancomycin  125 mg Oral Q12H   Continuous Infusions: . sodium chloride 20 mL/hr at 04/20/16 0212   PRN Meds:.alteplase, Cold Pack, heparin lock flush, heparin lock flush, Hot Pack, LORazepam, magic mouthwash w/lidocaine, mirtazapine, oxyCODONE, polyethylene glycol, senna-docusate, sodium chloride flush, sodium chloride flush  REVIEW OF SYSTEMS:    10 Point review of Systems was done is negative except as noted above.   LABORATORY DATA:  I have reviewed the data as listed  . CBC Latest Ref Rng & Units 04/21/2016 04/20/2016 04/19/2016  WBC 4.0 - 10.5 K/uL 18.5(H) 18.2(H) 9.8  Hemoglobin 13.0 -  17.0 g/dL 9.4(L) 10.4(L) 10.3(L)  Hematocrit 39.0 - 52.0 % 28.6(L) 32.2(L) 32.1(L)  Platelets 150 - 400 K/uL 342 314 286    . CMP Latest Ref Rng & Units 04/21/2016 04/20/2016 04/19/2016  Glucose 65 - 99 mg/dL 171(H) 186(H) 163(H)  BUN 6 - 20 mg/dL 14 15 14   Creatinine 0.61 - 1.24 mg/dL 0.65 0.59(L) 0.75  Sodium 135 - 145 mmol/L 139 137 136  Potassium 3.5 - 5.1 mmol/L 4.1 4.7 4.1  Chloride 101 - 111 mmol/L 107 105 103  CO2 22 - 32 mmol/L 25 25 27   Calcium 8.9 - 10.3 mg/dL 9.3 9.3 8.9  Total Protein 6.5 - 8.1 g/dL - - 6.3(L)  Total Bilirubin 0.3 - 1.2 mg/dL - - 0.6  Alkaline Phos 38 - 126 U/L - - 89  AST 15 - 41 U/L - - 22  ALT 17 - 63 U/L - - 26     RADIOGRAPHIC STUDIES: I have personally reviewed the radiological images as listed and agreed with the findings in the report. Dg Fluoro Guided Loc Of Needle/cath Tip For Spinal Inject Rt  Result Date: 04/19/2016 CLINICAL DATA:  Burkitt's lymphoma. Prophylactic intrathecal chemotherapy. EXAM: FLUOROSCOPICALLY GUIDED LUMBAR PUNCTURE FOR INTRATHECAL CHEMOTHERAPY FLUOROSCOPY TIME:  Fluoroscopy Time:  18 seconds Radiation Exposure Index (if provided by the fluoroscopic device): 2.65 mGy exposure Number of Acquired Spot Images: 0 PROCEDURE: Informed consent was obtained from the patient prior to the procedure. With the patient prone, the lower back was prepped with Betadine. 1% Lidocaine was used for local anesthesia. Lumbar puncture was performed at the L5-S1 level using a 20 gauge needle with return of clear CSF. 8 ml of CSF were obtained for laboratory studies. The chemotherapy sent from pharmacy and labeled with the patient's name and date of birth was slowly injected into the subarachnoid space. The patient tolerated the procedure well and there were no apparent complications. IMPRESSION: Successful intrathecal injection of chemotherapy. Electronically Signed   By: Monte Fantasia M.D.   On: 04/19/2016 14:56   Dg Fluoro Guided Loc Of  Needle/cath Tip For Spinal Inject Lt  Result Date: 03/30/2016 CLINICAL DATA:  Burkitt's lymphoma. Intrathecal methotrexate injection. EXAM: FLUOROSCOPICALLY GUIDED LUMBAR PUNCTURE FOR INTRATHECAL CHEMOTHERAPY TECHNIQUE: Informed consent was obtained from the patient prior to the procedure, including potential complications of headache, allergy, and pain. A 'time out' was performed. With the patient prone, the lower back was prepped with Betadine. 1% Lidocaine was used for local anesthesia. Lumbar puncture was performed at the L3-4 using a 20 gauge needle with return of clear CSF. 10 mL of methotrexate was injected into the subarachnoid space. The patient tolerated the procedure well without apparent complication. FLUOROSCOPY TIME:  24 seconds IMPRESSION: Intrathecal injection of chemotherapy without complication Electronically Signed   By: Rolm Baptise M.D.   On: 03/30/2016 12:55    ASSESSMENT & PLAN:   1) Stage  IVB Burkitt's lymphoma with t(8;14) translocation diagnosed 12/23/2015. This appears to be involving the sacrum causing sacral and root involvement with possible neurogenic bladder. PET CT scan as noted above shows extensive involvement with lymphoma. CSF negative for involvement. Bone marrow biopsy negative for involvement by lymphoma.   CURRENT TREATMENT The patient has completed 5cycles of EPOCH -R and 3 IT methotrexate doses  Planned treatment 6 cycles of EPOCH-R  IT methotrexate x 4 doses starting with cycle 3 for CNS prophylaxis  PLAN -tolerated C6D1 and tolerating C6D3 today. No issues after IT MTX -continue chemotherapy as ordered. -We'll continue the same supportive medications. -Rituxan on day 5 -Outpatient Neulasta on day 6 on Saturday 1:30 pm at the cancer center -We'll continue to monitor daily CBC and CMP. -leucocytosis due to steroids. Mild chemotherapy related anemia.  2) C diff diarrhea completed 14 days of treatment with oral vancomycin  Plan -continue  prophylactic po vancomycin till 3weeks post chemotherapy -Would avoid probiotics while on chemotherapy.  3) h/o Recurrent UTI -Given his recurrent UTI will place on prophylacticBactrim again on discharge.  3) DVTprophylaxis - TEDS and SCD . We'll start Lovenox 24 hours after lumbar puncture .  4) HTN -continue outpatient medications   5) PAD - has been on outpatient aspirin and Plavix . Was held for his planned lumbar puncture . restarted aspirin yesterday -lovenox (lower prophylactic dose) and plavix to be restarted today.   DISPO - On call Oncologist will cover patient tomorrow. Will plan to discharge patient on Friday after completion of chemotherapy. He has been scheduled to be seen in clinic with me in 4 weeks with PET/CT and labs  I spent 25 minutes counseling the patient face to face. The total time spent in the appointment was 35 minutes and more than 50% was on counseling and direct patient cares.    Sullivan Lone MD [place] AAHIVMS [facility] Hematology/Oncology Physician [facility]  (Office):       [phone] (Work cell):  [phone] (Fax):           [phone]

## 2016-04-22 LAB — BASIC METABOLIC PANEL
Anion gap: 9 (ref 5–15)
BUN: 17 mg/dL (ref 6–20)
CALCIUM: 9.4 mg/dL (ref 8.9–10.3)
CO2: 26 mmol/L (ref 22–32)
CREATININE: 0.7 mg/dL (ref 0.61–1.24)
Chloride: 104 mmol/L (ref 101–111)
GFR calc non Af Amer: >60 mL/min (ref >60)
Glucose, Bld: 149 mg/dL — ABNORMAL HIGH (ref 65–99)
Potassium: 4 mmol/L (ref 3.5–5.1)
SODIUM: 139 mmol/L (ref 135–145)

## 2016-04-22 LAB — CBC
HCT: 30.7 % — ABNORMAL LOW (ref 39.0–52.0)
Hemoglobin: 10 g/dL — ABNORMAL LOW (ref 13.0–17.0)
MCH: 27.7 pg (ref 26.0–34.0)
MCHC: 32.6 g/dL (ref 30.0–36.0)
MCV: 85 fL (ref 78.0–100.0)
PLATELETS: 328 10*3/uL (ref 150–400)
RBC: 3.61 MIL/uL — AB (ref 4.22–5.81)
RDW: 19.8 % — AB (ref 11.5–15.5)
WBC: 14.9 10*3/uL — AB (ref 4.0–10.5)

## 2016-04-22 MED ORDER — SODIUM CHLORIDE 0.9 % IV SOLN
Freq: Once | INTRAVENOUS | Status: AC
  Administered 2016-04-22: 18 mg via INTRAVENOUS
  Filled 2016-04-22: qty 4

## 2016-04-22 MED ORDER — VINCRISTINE SULFATE CHEMO INJECTION 1 MG/ML
Freq: Once | INTRAVENOUS | Status: AC
  Administered 2016-04-22: 12:00:00 via INTRAVENOUS
  Filled 2016-04-22 (×2): qty 10

## 2016-04-22 NOTE — Progress Notes (Signed)
Endre Coutts   DOB:08/17/1950   VZ#:[id number]   AJO#:[id number]  Subjective: Mr Plagge is currently receiving day 4 cycle 6of 6cyclesplanned of EPOCH-R. He denies h/a,N/V, mouth sores, cough, SOB, diarrhea, dysuria, fever, rash, bleeding or problem with his lines. He is very well informed regarding his treatment.No family in room   Objective: middle aged White man examined in recliner Vitals:   04/21/16 2140 04/22/16 0603  BP: 137/72 (!) 151/80  Pulse: 92 72  Resp: 16 18  Temp: 98.2 F (36.8 C) 97.4 F (36.3 C)    Body mass index is 23.87 kg/m.  Intake/Output Summary (Last 24 hours) at 04/22/16 0914 Last data filed at 04/22/16 0809  Gross per 24 hour  Intake          1955.67 ml  Output             3000 ml  Net         -1044.33 ml     Sclerae unicteric  Oropharynx shows no thrush or other lesions  No cervical or supraclavicular adenopathy  Lungs no rales or wheezes  Heart regular rate and rhythm  Abdomen soft, +BS  Neuro nonfocal     CBG (last 3)  No results for input(s): GLUCAP in the last 72 hours.   Labs:  Lab Results  Component Value Date   WBC 14.9 (H) 04/22/2016   HGB 10.0 (L) 04/22/2016   HCT 30.7 (L) 04/22/2016   MCV 85.0 04/22/2016   PLT 328 04/22/2016   NEUTROABS 7.9 (H) 04/19/2016    '@LASTCHEMISTRY'$ @  Urine Studies No results for input(s): UHGB, CRYS in the last 72 hours.  Invalid input(s): UACOL, UAPR, USPG, UPH, UTP, UGL, UKET, UBIL, UNIT, UROB, ULEU, UEPI, UWBC, URBC, UBAC, CAST, [place], [place]  Basic Metabolic Panel:  Recent Labs Lab 04/19/16 1056 04/20/16 0356 04/21/16 0413 04/22/16 0454  NA 136 137 139 139  K 4.1 4.7 4.1 4.0  CL 103 105 107 104  CO2 '27 25 25 26  '$ GLUCOSE 163* 186* 171* 149*  BUN '14 15 14 17  '$ CREATININE 0.75 0.59* 0.65 0.70  CALCIUM 8.9 9.3 9.3 9.4  MG 2.0  --   --   --   PHOS 3.2  --   --   --    GFR Estimated Creatinine Clearance: 102.4 mL/min (by C-G formula based on SCr of 0.7 mg/dL). Liver Function  Tests:  Recent Labs Lab 04/19/16 1056  AST 22  ALT 26  ALKPHOS 89  BILITOT 0.6  PROT 6.3*  ALBUMIN 3.9   No results for input(s): LIPASE, AMYLASE in the last 168 hours. No results for input(s): AMMONIA in the last 168 hours. Coagulation profile No results for input(s): INR, PROTIME in the last 168 hours.  CBC:  Recent Labs Lab 04/19/16 1056 04/20/16 0356 04/21/16 0413 04/22/16 0454  WBC 9.8 18.2* 18.5* 14.9*  NEUTROABS 7.9*  --   --   --   HGB 10.3* 10.4* 9.4* 10.0*  HCT 32.1* 32.2* 28.6* 30.7*  MCV 87.2 86.6 87.2 85.0  PLT 286 314 342 328   Cardiac Enzymes: No results for input(s): CKTOTAL, CKMB, CKMBINDEX, TROPONINI in the last 168 hours. BNP: Invalid input(s): POCBNP CBG: No results for input(s): GLUCAP in the last 168 hours. D-Dimer No results for input(s): DDIMER in the last 72 hours. Hgb A1c No results for input(s): HGBA1C in the last 72 hours. Lipid Profile No results for input(s): CHOL, HDL, LDLCALC, TRIG, CHOLHDL, LDLDIRECT in the last 72 hours.  Thyroid function studies No results for input(s): TSH, T4TOTAL, T3FREE, THYROIDAB in the last 72 hours.  Invalid input(s): FREET3 Anemia work up No results for input(s): VITAMINB12, FOLATE, FERRITIN, TIBC, IRON, RETICCTPCT in the last 72 hours. Microbiology Recent Results (from the past 240 hour(s))  TECHNOLOGIST REVIEW     Status: None   Collection Time: 04/15/16  8:24 AM  Result Value Ref Range Status   Technologist Review Large platelets  Final      Studies:  No results found.  Assessment: 65 y.o. [place] man with  1) Stage IVB Burkitt's lymphoma with t(8;14) translocation diagnosed 12/23/2015.  (2) The patient has completed 5cycles of EPOCH -R and 3IT methotrexate doses, currentlyadmitted for cycle 6  (3) cdifficile diarrhea: continuing oral vancomycin prophylactically   (4) Hx recurrent UTIs: to receive bactrim propjhylactically at discharge  Plan:  Mr Rosander is tolerating his current  cycle of chemotherapy with no side effects. His c difficile diarrhea also is clinically resolved. He is having no urinary symptoms.  Given timing of his chemo he is unlikely to be able to receive neulasta 11/25,morelikely it will be 11/27 and I will arrange for that   Chauncey Cruel, MD 04/22/2016  9:14 AM Medical Oncology and Hematology [facility] [place], [place] [address] Tel. [phone]    Fax. [phone]

## 2016-04-23 LAB — BASIC METABOLIC PANEL
ANION GAP: 7 (ref 5–15)
BUN: 18 mg/dL (ref 6–20)
CHLORIDE: 102 mmol/L (ref 101–111)
CO2: 29 mmol/L (ref 22–32)
Calcium: 9.3 mg/dL (ref 8.9–10.3)
Creatinine, Ser: 0.66 mg/dL (ref 0.61–1.24)
GFR calc Af Amer: >60 mL/min (ref >60)
GLUCOSE: 129 mg/dL — AB (ref 65–99)
POTASSIUM: 3.7 mmol/L (ref 3.5–5.1)
Sodium: 138 mmol/L (ref 135–145)

## 2016-04-23 LAB — CBC
HEMATOCRIT: 30.3 % — AB (ref 39.0–52.0)
HEMOGLOBIN: 9.9 g/dL — AB (ref 13.0–17.0)
MCH: 28.3 pg (ref 26.0–34.0)
MCHC: 32.7 g/dL (ref 30.0–36.0)
MCV: 86.6 fL (ref 78.0–100.0)
Platelets: 336 10*3/uL (ref 150–400)
RBC: 3.5 MIL/uL — ABNORMAL LOW (ref 4.22–5.81)
RDW: 19.7 % — AB (ref 11.5–15.5)
WBC: 9.8 10*3/uL (ref 4.0–10.5)

## 2016-04-23 MED ORDER — AMLODIPINE BESYLATE 10 MG PO TABS: 10.0000 mg | ORAL_TABLET | Freq: Every evening | ORAL | 3 refills | Status: DC

## 2016-04-23 MED ORDER — EPINEPHRINE HCL 0.1 MG/ML IJ SOSY: 0.2500 mg | PREFILLED_SYRINGE | Freq: Once | INTRAMUSCULAR | Status: DC | PRN

## 2016-04-23 MED ORDER — ALBUTEROL SULFATE (2.5 MG/3ML) 0.083% IN NEBU: 2.5000 mg | INHALATION_SOLUTION | Freq: Once | RESPIRATORY_TRACT | Status: DC | PRN

## 2016-04-23 MED ORDER — METHYLPREDNISOLONE SODIUM SUCC 125 MG IJ SOLR: 125.0000 mg | Freq: Once | INTRAMUSCULAR | Status: DC | PRN

## 2016-04-23 MED ORDER — ACETAMINOPHEN 325 MG PO TABS
650.0000 mg | ORAL_TABLET | Freq: Once | ORAL | Status: AC
  Administered 2016-04-23: 650 mg via ORAL
  Filled 2016-04-23: qty 2

## 2016-04-23 MED ORDER — EPINEPHRINE HCL 1 MG/ML IJ SOLN: 0.5000 mg | Freq: Once | INTRAMUSCULAR | Status: DC | PRN

## 2016-04-23 MED ORDER — DIPHENHYDRAMINE HCL 50 MG/ML IJ SOLN
50.0000 mg | Freq: Once | INTRAMUSCULAR | Status: DC | PRN
Start: 2016-04-23 — End: 2016-04-23

## 2016-04-23 MED ORDER — SODIUM CHLORIDE 0.9 % IV SOLN
375.0000 mg/m2 | Freq: Once | INTRAVENOUS | Status: AC
  Administered 2016-04-23: 800 mg via INTRAVENOUS
  Filled 2016-04-23: qty 80
  Filled 2016-04-23: qty 50

## 2016-04-23 MED ORDER — SODIUM CHLORIDE 0.9 % IV SOLN
Freq: Once | INTRAVENOUS | Status: AC
  Administered 2016-04-23: 16 mg via INTRAVENOUS
  Filled 2016-04-23: qty 8

## 2016-04-23 MED ORDER — SODIUM CHLORIDE 0.9 % IV SOLN
750.0000 mg/m2 | Freq: Once | INTRAVENOUS | Status: AC
  Administered 2016-04-23: 1560 mg via INTRAVENOUS
  Filled 2016-04-23: qty 78

## 2016-04-23 MED ORDER — FAMOTIDINE IN NACL 20-0.9 MG/50ML-% IV SOLN
20.0000 mg | Freq: Once | INTRAVENOUS | Status: DC | PRN
  Filled 2016-04-23: qty 50

## 2016-04-23 MED ORDER — SODIUM CHLORIDE 0.9 % IV SOLN: Freq: Once | INTRAVENOUS | Status: DC | PRN

## 2016-04-23 MED ORDER — DIPHENHYDRAMINE HCL 50 MG PO CAPS
50.0000 mg | ORAL_CAPSULE | Freq: Once | ORAL | Status: AC
  Administered 2016-04-23: 50 mg via ORAL
  Filled 2016-04-23: qty 1

## 2016-04-23 MED ORDER — DIPHENHYDRAMINE HCL 50 MG/ML IJ SOLN
25.0000 mg | Freq: Once | INTRAMUSCULAR | Status: DC | PRN
Start: 2016-04-23 — End: 2016-04-23

## 2016-04-23 MED ORDER — HEPARIN SOD (PORK) LOCK FLUSH 100 UNIT/ML IV SOLN: 500.0000 [IU] | Freq: Once | INTRAVENOUS | Status: DC

## 2016-04-23 NOTE — Progress Notes (Signed)
Cyclophosphamide dose and dilution verified with 2nd chemo competent RN Reyne Dumas.

## 2016-04-23 NOTE — Discharge Summary (Signed)
Physician Discharge Summary  Patient ID: Erik Vaughn MRN: [id number] DOB/AGE: 1950/10/28 65 y.o.  Admit date: 04/19/2016 Discharge date: 04/23/2016  Primary Care Physician:  Ernestene Kiel, MD   Discharge Diagnoses:  Burkii Lymphoma oflymph nodesofmultiple regions  Present on Admission: . Burkitt lymphoma of lymph nodes of multiple regions [facility])   Discharge Medications:  No current facility-administered medications on file prior to encounter.    Current Outpatient Prescriptions on File Prior to Encounter  Medication Sig Dispense Refill  . amLODipine (NORVASC) 10 MG tablet Take 10 mg by mouth every evening.     [name] aspirin EC 81 MG EC tablet Take 1 tablet (81 mg total) by mouth every evening.    . Cholecalciferol (VITAMIN D3) 2000 units TABS Take 2,000 Units by mouth daily with lunch.     . clopidogrel (PLAVIX) 75 MG tablet Take 1 tablet (75 mg total) by mouth every evening.    [name] dexamethasone (DECADRON) 4 MG tablet Take 2 tablets (8 mg total) by mouth 2 (two) times daily with a meal. Take two times a day starting the day after chemotherapy for 3 days. 30 tablet 1  . lisinopril (PRINIVIL,ZESTRIL) 20 MG tablet Take 20 mg by mouth every evening.     [name] LORazepam (ATIVAN) 0.5 MG tablet Take 1 tablet (0.5 mg total) by mouth every 6 (six) hours as needed (Nausea or vomiting). 30 tablet 0  . magic mouthwash w/lidocaine SOLN Take 5 mLs by mouth 4 (four) times daily as needed for mouth pain. 120 mL 0  . mirtazapine (REMERON) 15 MG tablet Take 1 tablet (15 mg total) by mouth at bedtime. (Patient taking differently: Take 15 mg by mouth at bedtime as needed (for sleep). ) 30 tablet 2  . Multiple Vitamins-Minerals (CENTRUM SILVER PO) Take 1 tablet by mouth daily with breakfast.     . ondansetron (ZOFRAN) 8 MG tablet Take 1 tablet (8 mg total) by mouth 2 (two) times daily. Take two times a day starting the day after chemo for 3 days. Then take two times a day as needed for nausea or  vomiting. 30 tablet 1  . oxyCODONE (OXY IR/ROXICODONE) 5 MG immediate release tablet Take 1-2 tablets (5-10 mg total) by mouth every 6 (six) hours as needed for moderate pain or severe pain. 60 tablet 0  . polyethylene glycol (MIRALAX) packet Take 17 g by mouth daily as needed for moderate constipation.    . prochlorperazine (COMPAZINE) 10 MG tablet Take 1 tablet (10 mg total) by mouth every 6 (six) hours as needed (Nausea or vomiting). 30 tablet 1  . senna-docusate (SENNA S) 8.6-50 MG tablet Take 2 tablets by mouth at bedtime as needed for moderate constipation.    . sulfamethoxazole-trimethoprim (BACTRIM DS,SEPTRA DS) 800-160 MG tablet Take 1 tablet by mouth daily. 30 tablet 1  . vancomycin (VANCOCIN) 50 mg/mL oral solution 2.76m ('125mg'$ ) po twice daily for c diff suppression while on chemotherapy 100 mL 1  . vitamin C (ASCORBIC ACID) 500 MG tablet Take 500 mg by mouth 2 (two) times daily.        Disposition and Follow-up: return to clinic 11/25/27for neulasta; see Dr KIrene Limbo12/19/2017 at 9 AM  Significant Diagnostic Studies:  Dg Fluoro Guided Loc Of Needle/cath Tip For Spinal Inject Rt  Result Date: 04/19/2016 CLINICAL DATA:  Burkitt's lymphoma. Prophylactic intrathecal chemotherapy. EXAM: FLUOROSCOPICALLY GUIDED LUMBAR PUNCTURE FOR INTRATHECAL CHEMOTHERAPY FLUOROSCOPY TIME:  Fluoroscopy Time:  18 seconds Radiation Exposure Index (if provided by the fluoroscopic device): 2.65  mGy exposure Number of Acquired Spot Images: 0 PROCEDURE: Informed consent was obtained from the patient prior to the procedure. With the patient prone, the lower back was prepped with Betadine. 1% Lidocaine was used for local anesthesia. Lumbar puncture was performed at the L5-S1 level using a 20 gauge needle with return of clear CSF. 8 ml of CSF were obtained for laboratory studies. The chemotherapy sent from pharmacy and labeled with the patient's name and date of birth was slowly injected into the subarachnoid space. The  patient tolerated the procedure well and there were no apparent complications. IMPRESSION: Successful intrathecal injection of chemotherapy. Electronically Signed   By: Monte Fantasia M.D.   On: 04/19/2016 14:56   Dg Fluoro Guided Loc Of Needle/cath Tip For Spinal Inject Lt  Result Date: 03/30/2016 CLINICAL DATA:  Burkitt's lymphoma. Intrathecal methotrexate injection. EXAM: FLUOROSCOPICALLY GUIDED LUMBAR PUNCTURE FOR INTRATHECAL CHEMOTHERAPY TECHNIQUE: Informed consent was obtained from the patient prior to the procedure, including potential complications of headache, allergy, and pain. A 'time out' was performed. With the patient prone, the lower back was prepped with Betadine. 1% Lidocaine was used for local anesthesia. Lumbar puncture was performed at the L3-4 using a 20 gauge needle with return of clear CSF. 10 mL of methotrexate was injected into the subarachnoid space. The patient tolerated the procedure well without apparent complication. FLUOROSCOPY TIME:  24 seconds IMPRESSION: Intrathecal injection of chemotherapy without complication Electronically Signed   By: Rolm Baptise M.D.   On: 03/30/2016 12:55    Discharge Laboratory Values: CBC    Component Value Date/Time   WBC 9.8 04/23/2016 0511   RBC 3.50 (L) 04/23/2016 0511   HGB 9.9 (L) 04/23/2016 0511   HGB 10.5 (L) 04/15/2016 0824   HCT 30.3 (L) 04/23/2016 0511   HCT 32.4 (L) 04/15/2016 0824   PLT 336 04/23/2016 0511   PLT 153 04/15/2016 0824   MCV 86.6 04/23/2016 0511   MCV 85.5 04/15/2016 0824   MCH 28.3 04/23/2016 0511   MCHC 32.7 04/23/2016 0511   RDW 19.7 (H) 04/23/2016 0511   RDW 20.4 (H) 04/15/2016 0824   LYMPHSABS 1.3 04/19/2016 1056   LYMPHSABS 1.1 04/15/2016 0824   MONOABS 0.5 04/19/2016 1056   MONOABS 1.2 (H) 04/15/2016 0824   EOSABS 0.0 04/19/2016 1056   EOSABS 0.1 04/15/2016 0824   BASOSABS 0.1 04/19/2016 1056   BASOSABS 0.1 04/15/2016 0824   BMET    Component Value Date/Time   NA 138 04/23/2016 0511    NA 137 04/15/2016 0824   K 3.7 04/23/2016 0511   K 4.8 04/15/2016 0824   CL 102 04/23/2016 0511   CO2 29 04/23/2016 0511   CO2 24 04/15/2016 0824   GLUCOSE 129 (H) 04/23/2016 0511   GLUCOSE 131 04/15/2016 0824   BUN 18 04/23/2016 0511   BUN 9.0 04/15/2016 0824   CREATININE 0.66 04/23/2016 0511   CREATININE 0.8 04/15/2016 0824   CALCIUM 9.3 04/23/2016 0511   CALCIUM 9.5 04/15/2016 0824   GFRNONAA >60 04/23/2016 0511   GFRAA >60 04/23/2016 0511     Brief H and P: For complete details please refer to admission H and P, but in brief, this 65y/o Erik Vaughn man wasadmittedfor his final cycleof EPOCH-R chemotherapy. For details see HospitalCourse  Physical Exam at Discharge: BP (!) 154/75 (BP Location: Right Arm)   Pulse 69   Temp 97.6 F (36.4 C) (Oral)   Resp 18   Ht 6' (1.829 m)   Wt 176 lb (79.8 kg)  SpO2 100%   BMI 23.87 kg/m  Genappears well Cardiovascular:RRR Respiratory:no raledor wheezes Gastrointestinal:+BS,NT Extremities:minimalankle edema    Hospital Course:  Active Problems:   Burkitt lymphoma of lymph nodes of multiple regions ([place]) MrAsbill was admitted 11/20/2017and startee cycle6 chemotherapy consisting of doxorubicin/etoposide/methotrexate andprednisone dily x 5,completed 04/23/2016,followedbyrituximab. Hetolerated treatment well,specificallyhis port functioned appropriately,nomouth sores,N/V, change in bowelor bladder habits,rash,fever or bleeding.L:abs tremained appropriate.Hewill be discharged today 11/24 on completion of his chemotherapyand isaware of his follow up appointments as well as of the needto take bactrim andoral vancomycin as prescribed-- he has themedicaitons in hand   Diet:  regular  Activity:  As tolerated  Condition at Discharge:   stable  Signed: Dr. Lurline Del [phone]  04/23/2016, 7:46 AM

## 2016-04-23 NOTE — Discharge Instructions (Signed)
calloffice with any problems

## 2016-04-23 NOTE — Discharge Summary (Signed)
seeseparately dictated and signed note today

## 2016-04-23 NOTE — Progress Notes (Cosign Needed)
rituxan dose and calculations checked with Drue Dun RN,chemo competent nurse.

## 2016-04-24 ENCOUNTER — Ambulatory Visit (HOSPITAL_BASED_OUTPATIENT_CLINIC_OR_DEPARTMENT_OTHER): Payer: PRIVATE HEALTH INSURANCE

## 2016-04-24 VITALS — BP 140/61 | HR 98 | Temp 98.2°F

## 2016-04-24 DIAGNOSIS — C8378 Burkitt lymphoma, lymph nodes of multiple sites: Secondary | ICD-10-CM

## 2016-04-24 DIAGNOSIS — D701 Agranulocytosis secondary to cancer chemotherapy: Secondary | ICD-10-CM | POA: Diagnosis not present

## 2016-04-24 MED ORDER — PEGFILGRASTIM INJECTION 6 MG/0.6ML ~~LOC~~
6.0000 mg | PREFILLED_SYRINGE | Freq: Once | SUBCUTANEOUS | Status: AC
  Administered 2016-04-24: 6 mg via SUBCUTANEOUS

## 2016-04-25 NOTE — Progress Notes (Signed)
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 This appears to be involving the sacrum causing sacral and root involvement with possible neurogenic bladder. PET CT scan as noted above shows extensive involvement with lymphoma. CSF negative for involvement. Bone marrow biopsy negative for involvement by lymphoma.   CURRENT TREATMENT The patient has completed 5 cycles of EPOCH -R  Planned treatment 6 cycles of EPOCH-R + IT methotrexate x 4 doses starting with cycle 3 for CNS prophylaxis (he has completed 3 out of the 4 planned doses of intrathecal methotrexate).   HISTORY OF PRESENTING ILLNESS:  Please see my previous note for details  INTERVAL HISTORY  Erik Vaughn is here for a follow-up with his wife for reevaluation prior to admission for his sixth cycle of EPOCH-R and 4th dose of intrathecal methotrexate. His neutropenia has resolved he did not develop any fevers while being on prophylactic Bactrim and po vancomycin. Notes some mild grade 1 fatigue. Eating well. No significant diarrhea. No other acute new symptoms.   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   He smoked about 2 packs a day for 40 years.  SURGICAL HISTORY:  #1 left submandibular salivary gland benign tumor excision #2 port placement - 12/31/2015  SOCIAL HISTORY: Social History   Social History  . Marital status: Married    Spouse name: N/A  . Number of children: N/A  . Years of education: N/A    Occupational History  . meter specialist    Social History Main Topics  . Smoking status: Former Smoker    Packs/day: 2.00    Years: 16.00    Quit date: 12/22/2005  . Smokeless tobacco: Never Used  . Alcohol use Yes     Comment: occasionally  . Drug use: No  . Sexual activity: No   Other Topics Concern  . Not on file   Social History Narrative  . No narrative on file  smoked 2 packs of cigarettes per day for 40 years quit about 8-9 years ago. Denies significant alcohol use. 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  FAMILY HISTORY:  Sister had breast cancer at age 200 years. Unknown if she had genetic testing One maternal uncle had melanoma Second maternal uncle had pancreatic cancer under the age of 63yrPaternal uncle with prostate cancer Dad had prostate cancer at age 2038years   ALLERGIES:  has No Known Allergies.  MEDICATIONS:  Current Outpatient Prescriptions  Medication Sig Dispense Refill  . amLODipine (NORVASC) 10 MG tablet Take 10 mg by mouth every evening.     .[name] KitchenamLODipine (NORVASC) 10 MG tablet Take 1 tablet (10 mg total) by mouth every evening. 30 tablet 3  . aspirin EC 81 MG EC tablet Take 1 tablet (81 mg total) by mouth every evening.    . Cholecalciferol (VITAMIN D3) 2000 units TABS Take 2,000 Units by mouth daily with lunch.     .[name]  clopidogrel (PLAVIX) 75 MG tablet Take 1 tablet (75 mg total) by mouth every evening.    [name] dexamethasone (DECADRON) 4 MG tablet Take 2 tablets (8 mg total) by mouth 2 (two) times daily with a meal. Take two times a day starting the day after chemotherapy for 3 days. 30 tablet 1  . lisinopril (PRINIVIL,ZESTRIL) 20 MG tablet Take 20 mg by mouth every evening.     [name] LORazepam (ATIVAN) 0.5 MG tablet Take 1 tablet (0.5 mg total) by mouth every 6 (six) hours as needed  (Nausea or vomiting). 30 tablet 0  . magic mouthwash w/lidocaine SOLN Take 5 mLs by mouth 4 (four) times daily as needed for mouth pain. 120 mL 0  . mirtazapine (REMERON) 15 MG tablet Take 1 tablet (15 mg total) by mouth at bedtime. (Patient taking differently: Take 15 mg by mouth at bedtime as needed (for sleep). ) 30 tablet 2  . Multiple Vitamins-Minerals (CENTRUM SILVER PO) Take 1 tablet by mouth daily with breakfast.     . ondansetron (ZOFRAN) 8 MG tablet Take 1 tablet (8 mg total) by mouth 2 (two) times daily. Take two times a day starting the day after chemo for 3 days. Then take two times a day as needed for nausea or vomiting. 30 tablet 1  . oxyCODONE (OXY IR/ROXICODONE) 5 MG immediate release tablet Take 1-2 tablets (5-10 mg total) by mouth every 6 (six) hours as needed for moderate pain or severe pain. 60 tablet 0  . polyethylene glycol (MIRALAX) packet Take 17 g by mouth daily as needed for moderate constipation.    . prochlorperazine (COMPAZINE) 10 MG tablet Take 1 tablet (10 mg total) by mouth every 6 (six) hours as needed (Nausea or vomiting). 30 tablet 1  . senna-docusate (SENNA S) 8.6-50 MG tablet Take 2 tablets by mouth at bedtime as needed for moderate constipation.    . sulfamethoxazole-trimethoprim (BACTRIM DS,SEPTRA DS) 800-160 MG tablet Take 1 tablet by mouth daily. 30 tablet 1  . vancomycin (VANCOCIN) 50 mg/mL oral solution 2.25m (1255m po twice daily for c diff suppression while on chemotherapy 100 mL 1  . vitamin C (ASCORBIC ACID) 500 MG tablet Take 500 mg by mouth 2 (two) times daily.     No current facility-administered medications for this visit.     REVIEW OF SYSTEMS:    10 Point review of Systems was done is negative except as noted above.  PHYSICAL EXAMINATION: ECOG PERFORMANCE STATUS: 1 - Symptomatic but completely ambulatory  . Vitals:   04/15/16 0856  BP: 121/72  Pulse: 85  Resp: 19  Temp: 98.5 F (36.9 C)   Filed Weights   04/15/16 0856  Weight:  176 lb 12.8 oz (80.2 kg)   .Body mass index is 23.98 kg/m.  GENERAL:alert, in no acute distress and comfortable SKIN: skin color, texture, turgor are normal, no rashes or significant lesions EYES: normal, conjunctiva are pink and non-injected, sclera clear OROPHARYNX:no exudate, no erythema and lips, buccal mucosa, and tongue normal  NECK: supple, no JVD, thyroid normal size, non-tender, without nodularity LYMPH:  no palpable lymphadenopathy in the cervical, axillary or inguinal LUNGS: clear to auscultation with normal respiratory effort, large left anterior chest wall mass noted appears nontender and fixed. HEART: regular rate & rhythm,  no murmurs and no lower extremity edema ABDOMEN: abdomen soft, non-tender, normoactive bowel sounds  Musculoskeletal: no cyanosis of digits and no clubbing  PSYCH: alert & oriented x 3 with fluent speech NEURO: no focal motor/sensory  deficits  LABORATORY DATA:  I have reviewed the data as listed  Component     Latest Ref Rng & Units 04/15/2016  WBC     4.0 - 10.5 K/uL 11.3 (H)  NEUT#     1.5 - 6.5 10e3/uL 8.9 (H)  Hemoglobin     13.0 - 17.0 g/dL 10.5 (L)  HCT     39.0 - 52.0 % 32.4 (L)  Platelets     150 - 400 K/uL 153  MCV     78.0 - 100.0 fL 85.5  MCH     26.0 - 34.0 pg 27.7  MCHC     30.0 - 36.0 g/dL 32.4  RBC     4.22 - 5.81 MIL/uL 3.79 (L)  RDW     11.5 - 15.5 % 20.4 (H)  lymph#     0.9 - 3.3 10e3/uL 1.1  MONO#     0.1 - 0.9 10e3/uL 1.2 (H)  Eosinophils Absolute     0.0 - 0.5 10e3/uL 0.1  Basophils Absolute     0.0 - 0.1 10e3/uL 0.1  NEUT%     39.0 - 75.0 % 78.5 (H)  LYMPH%     14.0 - 49.0 % 9.4 (L)  MONO%     0.0 - 14.0 % 10.7  EOS%     0.0 - 7.0 % 0.4  BASO%     0.0 - 2.0 % 1.0  Retic %     0.80 - 1.80 % 4.36 (H)  Retic Ct Abs     34.80 - 93.90 10e3/uL 165.24 (H)  Immature Retic Fract     3.00 - 10.60 % 17.10 (H)  Sodium     136 - 145 mEq/L 137  Potassium     3.5 - 5.1 mEq/L 4.8  Chloride     98 - 109  mEq/L 103  CO2     22 - 29 mEq/L 24  Glucose     70 - 140 mg/dl 131  BUN     7.0 - 26.0 mg/dL 9.0  Creatinine     0.7 - 1.3 mg/dL 0.8  Total Bilirubin     0.20 - 1.20 mg/dL 0.35  Alkaline Phosphatase     40 - 150 U/L 109  AST     5 - 34 U/L 22  ALT     0 - 55 U/L 26  Total Protein     6.4 - 8.3 g/dL 6.6  Albumin     3.5 - 5.0 g/dL 3.5  Calcium     8.4 - 10.4 mg/dL 9.5  Anion gap     3 - 11 mEq/L 11  EGFR     >90 ml/min/1.73 m2 >90    RADIOGRAPHIC STUDIES: I have personally reviewed the radiological images as listed and agreed with the findings in the report.  ASSESSMENT & PLAN:   66 year old Caucasian male with  #1 Stage IVB Burkitt's lymphoma with t(8;14) translocation This appears to be involving the sacrum causing sacral and root involvement with possible neurogenic bladder. PET CT scan as noted above shows extensive involvement with lymphoma. CSF negative for involvement. Bone marrow biopsy negative for involvement by lymphoma. PET/CT scan after 3 cycles of EPOCH-R  on 03/02/2016  showed significant interval metabolic response.  Patient has completed cycle 5 of EPOCH-R and tolerated it well with no prohibitive toxicities. Grade 1 fatigue.  He has had some episodes of fevers including a periodontal abscess,  non-neutropenic fevers - unclear etiology, recent Escherichia  coli urinary tract infection x 2 and Clostridium difficile diarrhea .  #2 normocytic anemia likely related to chemotherapy.  No evidence of bleeding. Improving hemoglobin  #3 Escherichia coli urinary tract infection - recurrent completed antibiotics. currently on Bactrim prophylaxis to avoid recurrent infections especially in the setting of neutropenia .  #4 diarrhea-C. difficile colitis - has completed 14 days of treatment. Currently on vancomycin twice daily for prophylaxis.  #5 neutropenia due to recent chemotherapy. Resolved with Neulasta. Did not have neutropenic fevers on this occasion in  contrast to his previous cycle of treatment.  Plan - no clinical evidence of lymphoma progression at this time . -Continue prophylactic Bactrim until his admission for his sixth cycle of R-EPOCH and then will restart again on discharge after his chemotherapy. -Continue preventative oral vancomycin for C. Difficile while on active chemotherapy. - We shall plan to admit the patient on Monday 04/19/2016 for cycle 6 of EPOCH-R chemotherapy with intrathecal methotrexate day 1 of his cycle by IR (for CNS prophylaxis) as per plan. -Peridex mouth washes 2 times a day for a week and then twice daily. -He has held aspirin and Plavix one week prior to his planned admission date for lumbar puncture.  #4 left sacral ala and S1 fracture. L5-S1 and S1-S2 left-sided nerve root compression. #5 neoplasm related pain - well-controlled.  Minimal use of oxycodone. Plan -Pain well controlled. - when necessary oxycodone for intermittent neoplasm related pain. -Senna S and MiraLAX as needed.  #4 Hypertension, dyslipidemia, peripheral arterial disease -Continue management as per primary care physician.  #5 ex-smoker with 80-pack-year history of smoking.  Patient's inpatient hospitalization for 04/19/2016 has been set up and approved.  I spent 20 minutes counseling the patient face to face. The total time spent in the appointment was 20 minutes and more than 50% was on counseling and direct patient cares and co-ordinating inpatient hospitalization logistics    Erik Lone MD [place] AAHIVMS [facility] [place] Hematology/Oncology Physician [facility]  (Office):       [phone] (Work cell):  [phone] (Fax):           [phone]

## 2016-04-28 ENCOUNTER — Telehealth: Payer: Self-pay | Admitting: *Deleted

## 2016-04-28 ENCOUNTER — Other Ambulatory Visit: Payer: Self-pay | Admitting: *Deleted

## 2016-04-28 DIAGNOSIS — C8370 Burkitt lymphoma, unspecified site: Secondary | ICD-10-CM

## 2016-04-28 MED ORDER — MAGIC MOUTHWASH W/LIDOCAINE: 5.0000 mL | Freq: Four times a day (QID) | ORAL | 0 refills | Status: DC | PRN

## 2016-04-28 NOTE — Telephone Encounter (Signed)
"  Can he take Benadryl?  The left ear is stopping up.  Used a hair dryer to [employer] ear which worked yesterday but it came back.  Excessively loud ringing in the ear, off balance, nose running, watering eyes, no fever or seasonal allergies.  Sounds a little hoarse.  Has history of ringing in ears, is H.O.H."  Will notify Dr. Irene Limbo of these symptoms as Benadryl will not hurt these symptoms.

## 2016-04-28 NOTE — Telephone Encounter (Signed)
Refilled called into pharmacy

## 2016-04-28 NOTE — Telephone Encounter (Signed)
Verbal order received and read back from Dr. Irene Limbo for Benadryl and to call back if no relief.  Can see PCP or [facility]  Order given to patient's spouse  at this time.

## 2016-04-28 NOTE — Telephone Encounter (Signed)
Received call @ 220  In regards to pt. Erik Vaughn needing a refill on his  MAGIC MONTHWASH 77m

## 2016-05-13 ENCOUNTER — Encounter (HOSPITAL_COMMUNITY)
Admission: RE | Admit: 2016-05-13 | Discharge: 2016-05-13 | Disposition: A | Discharge status: Home / Self Care | Payer: PRIVATE HEALTH INSURANCE | Source: Ambulatory Visit | Attending: Hematology | Admitting: Hematology

## 2016-05-13 DIAGNOSIS — C8378 Burkitt lymphoma, lymph nodes of multiple sites: Secondary | ICD-10-CM

## 2016-05-17 ENCOUNTER — Encounter (HOSPITAL_COMMUNITY)
Admission: RE | Admit: 2016-05-17 | Discharge: 2016-05-17 | Disposition: A | Discharge status: Home / Self Care | Payer: PRIVATE HEALTH INSURANCE | Source: Ambulatory Visit | Attending: Hematology | Admitting: Hematology

## 2016-05-17 DIAGNOSIS — C8378 Burkitt lymphoma, lymph nodes of multiple sites: Secondary | ICD-10-CM | POA: Diagnosis not present

## 2016-05-17 LAB — GLUCOSE, CAPILLARY: GLUCOSE-CAPILLARY: 107 mg/dL — AB (ref 65–99)

## 2016-05-17 MED ORDER — FLUDEOXYGLUCOSE F - 18 (FDG) INJECTION
8.7200 | Freq: Once | INTRAVENOUS | Status: AC | PRN
  Administered 2016-05-17: 8.72 via INTRAVENOUS

## 2016-05-18 ENCOUNTER — Telehealth: Payer: Self-pay | Admitting: Hematology

## 2016-05-18 ENCOUNTER — Encounter: Payer: Self-pay | Admitting: Radiation Oncology

## 2016-05-18 ENCOUNTER — Encounter: Payer: Self-pay | Admitting: Hematology

## 2016-05-18 ENCOUNTER — Other Ambulatory Visit (HOSPITAL_BASED_OUTPATIENT_CLINIC_OR_DEPARTMENT_OTHER): Payer: PRIVATE HEALTH INSURANCE

## 2016-05-18 ENCOUNTER — Ambulatory Visit (HOSPITAL_BASED_OUTPATIENT_CLINIC_OR_DEPARTMENT_OTHER): Payer: PRIVATE HEALTH INSURANCE | Admitting: Hematology

## 2016-05-18 VITALS — BP 129/61 | HR 89 | Temp 98.1°F | Resp 18 | Ht 72.0 in | Wt 191.9 lb

## 2016-05-18 DIAGNOSIS — C8378 Burkitt lymphoma, lymph nodes of multiple sites: Secondary | ICD-10-CM

## 2016-05-18 DIAGNOSIS — T451X5A Adverse effect of antineoplastic and immunosuppressive drugs, initial encounter: Secondary | ICD-10-CM

## 2016-05-18 DIAGNOSIS — I10 Essential (primary) hypertension: Secondary | ICD-10-CM

## 2016-05-18 DIAGNOSIS — G620 Drug-induced polyneuropathy: Secondary | ICD-10-CM

## 2016-05-18 DIAGNOSIS — N390 Urinary tract infection, site not specified: Secondary | ICD-10-CM

## 2016-05-18 DIAGNOSIS — D649 Anemia, unspecified: Secondary | ICD-10-CM | POA: Diagnosis not present

## 2016-05-18 LAB — CBC & DIFF AND RETIC
BASO%: 0.7 % (ref 0.0–2.0)
Basophils Absolute: 0 10*3/uL (ref 0.0–0.1)
EOS ABS: 0.2 10*3/uL (ref 0.0–0.5)
EOS%: 3.3 % (ref 0.0–7.0)
HCT: 32.6 % — ABNORMAL LOW (ref 38.4–49.9)
HGB: 10.5 g/dL — ABNORMAL LOW (ref 13.0–17.1)
IMMATURE RETIC FRACT: 12.8 % — AB (ref 3.00–10.60)
LYMPH%: 27.4 % (ref 14.0–49.0)
MCH: 28 pg (ref 27.2–33.4)
MCHC: 32.2 g/dL (ref 32.0–36.0)
MCV: 86.9 fL (ref 79.3–98.0)
MONO#: 0.4 10*3/uL (ref 0.1–0.9)
MONO%: 7.3 % (ref 0.0–14.0)
NEUT%: 61.3 % (ref 39.0–75.0)
NEUTROS ABS: 3.7 10*3/uL (ref 1.5–6.5)
PLATELETS: 336 10*3/uL (ref 140–400)
RBC: 3.75 10*6/uL — AB (ref 4.20–5.82)
RDW: 18 % — ABNORMAL HIGH (ref 11.0–14.6)
Retic %: 3.79 % — ABNORMAL HIGH (ref 0.80–1.80)
Retic Ct Abs: 142.13 10*3/uL — ABNORMAL HIGH (ref 34.80–93.90)
WBC: 6.1 10*3/uL (ref 4.0–10.3)
lymph#: 1.7 10*3/uL (ref 0.9–3.3)
nRBC: 0 % (ref 0–0)

## 2016-05-18 LAB — COMPREHENSIVE METABOLIC PANEL
ALBUMIN: 3.5 g/dL (ref 3.5–5.0)
ALK PHOS: 107 U/L (ref 40–150)
ALT: 27 U/L (ref 0–55)
ANION GAP: 7 meq/L (ref 3–11)
AST: 22 U/L (ref 5–34)
BILIRUBIN TOTAL: 0.43 mg/dL (ref 0.20–1.20)
BUN: 11 mg/dL (ref 7.0–26.0)
CALCIUM: 9.5 mg/dL (ref 8.4–10.4)
CO2: 27 meq/L (ref 22–29)
Chloride: 105 mEq/L (ref 98–109)
Creatinine: 0.7 mg/dL (ref 0.7–1.3)
Glucose: 82 mg/dl (ref 70–140)
Potassium: 4.4 mEq/L (ref 3.5–5.1)
Sodium: 140 mEq/L (ref 136–145)
TOTAL PROTEIN: 6.3 g/dL — AB (ref 6.4–8.3)

## 2016-05-18 LAB — LACTATE DEHYDROGENASE: LDH: 196 U/L (ref 125–245)

## 2016-05-18 MED ORDER — MIRTAZAPINE 15 MG PO TABS: 15.0000 mg | ORAL_TABLET | Freq: Every evening | ORAL | Status: DC | PRN

## 2016-05-18 MED ORDER — OXYCODONE HCL 5 MG PO TABS: 5.0000 mg | ORAL_TABLET | Freq: Four times a day (QID) | ORAL | 0 refills | Status: DC | PRN

## 2016-05-18 MED ORDER — DULOXETINE HCL 30 MG PO CPEP: 30.0000 mg | ORAL_CAPSULE | Freq: Every day | ORAL | 1 refills | Status: DC

## 2016-05-18 NOTE — Telephone Encounter (Signed)
Gave patient avs report and appointments for February and appointment for radonc 06/14/16.   Radonc front office will send message to Dr. Isidore Moos to see if patient can get in sooner.

## 2016-05-19 ENCOUNTER — Encounter (HOSPITAL_COMMUNITY): Payer: PRIVATE HEALTH INSURANCE

## 2016-05-19 NOTE — Progress Notes (Signed)
Lymphoma Location(s) / Histology: 12/31/15 Diagnosis Bone Marrow, Aspirate,Biopsy, and Clot, Right iliac - NORMOCELLULAR MARROW WITH TRILINEAGE HEMATOPOIESIS AND MATURATION. - NO EVIDENCE OF BURKITT LYMPHOMA.  Biopsies of  revealed:  12/23/15 Diagnosis Soft Tissue Needle Core Biopsy, 7 cm left chest wall mass - AGGRESSIVE LARGE B CELL LYMPHOMA.  Karnell Deeb presented months ago with symptoms of:  Patient presented to Dr. Tonita Cong (orthopedics) with worsening lower back pain especially in the left lower back radiating into the left leg and foot since May 2017.  Patient notes that the pain has been progressive ill-defined that he cannot sit for a length of time nor any sleep comfortably on his back He also reports that he has been having some difficulties controlling his bladder and has been losing control of his urine for the Last several weeks to a month. No overt lower extremity weakness.  Over the last 3 weeks the patient has also noted a prominent left chest wall mass  Biopsies of  revealed:  Large B-cell Lymphoma  Past/Anticipated interventions by medical oncology, if any:  Dr. Irene Limbo CURRENT TREATMENT The patient has completed 5cycles of EPOCH -R and 3IT methotrexate doses  Planned treatment 6 cycles of EPOCH-R  IT methotrexate x 4 doses starting with cycle 3 for CNS prophylaxis   Weight changes, if any, over the past 6 months: He had lost weight, but has gained it back. He reports he was 172 lb at one point. He is back up to 191.6 today.   Recurrent fevers, or drenching night sweats, if any: He denies  SAFETY ISSUES:  Prior radiation? No  Pacemaker/ICD? No  Possible current pregnancy? No  Is the patient on methotrexate? No  Current Complaints / other details:   05/17/16 IMPRESSION: 1. No clear evidence of active lymphoma on neck, chest, abdomen, and pelvis FDG PET exam. 2. Activity remaining in the LEFT sacral ala may relate to a pathologic fracture. No significant  change from most recent exam. Similar findings in posterior LEFT rib.   He is here to discuss radiation to his lower sacrum area.   BP 126/73   Pulse 82   Temp 98 F (36.7 C)   Ht 6' (1.829 m)   Wt 191 lb 9.6 oz (86.9 kg)   SpO2 100% Comment: room air  BMI 25.99 kg/m    Wt Readings from Last 3 Encounters:  05/21/16 191 lb 9.6 oz (86.9 kg)  05/18/16 191 lb 14.4 oz (87 kg)  04/19/16 176 lb (79.8 kg)

## 2016-05-19 NOTE — Progress Notes (Signed)
Lymphoma Location(s) / Histology: B-Cell Lymphoma  Erik Vaughn presented with symptoms of:   Documented by Dr. Irene Limbo on 12/21/15 To Dr. Tonita Cong (orthopedics) with worsening lower back pain especially in the left lower back radiating into the left leg and foot since May 2017.  Patient notes that the pain has been progressive ill-defined that he cannot sit for a length of time nor any sleep comfortably on his back. He also reports that he has been having some difficulties controlling his bladder and has been losing control of his urine for the Last several weeks to a month. No overt lower extremity weakness.  Over the last 3 weeks the patient has also noted a prominent left chest wall mass.  Biopsies of  revealed:  12/23/15 Diagnosis Soft Tissue Needle Core Biopsy, 7 cm left chest wall mass - AGGRESSIVE LARGE B CELL LYMPHOMA.  12/31/15 Diagnosis Bone Marrow, Aspirate,Biopsy, and Clot, Right iliac - NORMOCELLULAR MARROW WITH TRILINEAGE HEMATOPOIESIS AND MATURATION. - NO EVIDENCE OF BURKITT LYMPHOMA.   Past/Anticipated interventions by medical oncology, if any:  04/21/16 Dr. Irene Limbo ASSESSMENT & PLAN:   1) Stage IVB Burkitt's lymphoma with t(8;14) translocation diagnosed 12/23/2015. This appears to be involving the sacrum causing sacral and root involvement with possible neurogenic bladder. PET CT scan as noted above shows extensive involvement with lymphoma. CSF negative for involvement. Bone marrow biopsy negative for involvement by lymphoma.   CURRENT TREATMENT The patient has completed 5cycles of EPOCH -R and 3IT methotrexate doses  Planned treatment 6 cycles of EPOCH-R  IT methotrexate x 4 doses starting with cycle 3 for CNS prophylaxis   Weight changes, if any, over the past 6 months:   Recurrent fevers, or drenching night sweats, if any:   SAFETY ISSUES:  Prior radiation?   Pacemaker/ICD?  Possible current pregnancy?   Is the patient on methotrexate?   Current  Complaints / other details:

## 2016-05-21 ENCOUNTER — Encounter: Payer: Self-pay | Admitting: Radiation Oncology

## 2016-05-21 ENCOUNTER — Ambulatory Visit
Admission: RE | Admit: 2016-05-21 | Discharge: 2016-05-21 | Disposition: A | Discharge status: Home / Self Care | Payer: PRIVATE HEALTH INSURANCE | Source: Ambulatory Visit | Attending: Radiation Oncology | Admitting: Radiation Oncology

## 2016-05-21 DIAGNOSIS — C8378 Burkitt lymphoma, lymph nodes of multiple sites: Secondary | ICD-10-CM | POA: Diagnosis not present

## 2016-05-21 DIAGNOSIS — Z7982 Long term (current) use of aspirin: Secondary | ICD-10-CM | POA: Diagnosis not present

## 2016-05-21 DIAGNOSIS — Z79899 Other long term (current) drug therapy: Secondary | ICD-10-CM | POA: Insufficient documentation

## 2016-05-21 DIAGNOSIS — Z87891 Personal history of nicotine dependence: Secondary | ICD-10-CM | POA: Diagnosis not present

## 2016-05-21 NOTE — Progress Notes (Signed)
error 

## 2016-05-21 NOTE — Progress Notes (Signed)
Radiation Oncology         [id number]) [phone] ________________________________  Initial outpatient Consultation  Name: Erik Vaughn MRN: [id number]  Date: 05/21/2016  DOB: July 26, 1950  LF:YBOFBPZW,CHENIDPO, MD  Brunetta Genera, MD   REFERRING PHYSICIAN: Brunetta Genera, MD  DIAGNOSIS: STAGE IVB Burkitt's Lymphoma    ICD-9-CM ICD-10-CM   1. Burkitt lymphoma of lymph nodes of multiple sites ([place]) 200.28 C83.78     CHIEF COMPLAINT: Here to discuss management of lymphoma   HISTORY OF PRESENT ILLNESS::Erik Vaughn is a 65 y.o. male who originally presented to medical oncologist Dr. Irene Limbo 12/18/15 for the evaluation of a newly diagnosed metastatic malignancy found on 12/13/15 MRI of lumbar spine by the patient's orthopaedic physician.   The patient also underwent an ultrasound of his left chest wall mass at that time which showed an irregular marginated hypoechoic mass measuring 6.3 x 3.7 x 7.3 cm in the left pectoral region. Biopsy of this mass 12/23/15 revealed aggressive large B cell lymphoma c/w Burkitt's.   Initial PET on 12/25/15 showed widespread lymphoma in the chest, abdomen, and pelvis. There was a 13 mm posterior right upper lobe pulmonary nodule, suspicious for pulmonary metastasis. Upper thoracic and left perihilar nodal lymphoma. Large nodal masses in the right mid abdomen and left pelvis. Osseous metastases involving the left anterior and left posterior chest wall, with pathologic fracture of the left posterior 9th rib. Additional osseous metastases involving the left sacral and right proximal humerus.  The patient received chemotherapy with Dr. Irene Limbo. He received his last chemotherapy treatment the week of Thanksgiving. He received EPOCH-R and IT MTX.  Most recent PET on 05/17/16 showed no clear evidence of active lymphoma on neck, chest, abdomen, and pelvis FDG PET exam. There was activity remaining in the left sacral ala with similar findings in the posterior left rib, felt to  be related to prior fracture.   Erik Vaughn presents to our clinic today accompanied by his wife. The patient is here to discuss radiation, if needed.  He feels better than he has in a long time - since this all got started. States he is improving everyday by getting out and walking.   He denies any pain in his lower sacrum or posterior rib or chest wall. Reports his left leg is weaker than his right, and this has been over the last several months.   He lost weight during chemotherapy but has started regaining weight. He denies fevers or diaphoresis. He reports intermittent loose stool and attributes this to taking antibiotics with chemotherapy. Since stopping these antibiotics his bowels have improved. He also has a lot of stomach noises while eating. Gaining weight.  He denies headaches, seizures, or vision changes. He hasn't noticed any new lumps or bumps. Denies abdominal pain or shortness of breath. No depression or anxiety.   PREVIOUS RADIATION THERAPY: No  PAST MEDICAL HISTORY:  has a past medical history of Cancer ([place]); Hypertension; and PAD (peripheral artery disease) ([facility]) (left leg).    PAST SURGICAL HISTORY: Past Surgical History:  Procedure Laterality Date  . IR GENERIC HISTORICAL  12/31/2015   IR FLUORO GUIDE CV LINE RIGHT 12/31/2015 Arne Cleveland, MD [facility]  . IR GENERIC HISTORICAL  12/31/2015   IR US GUIDE VASC ACCESS RIGHT 12/31/2015 Arne Cleveland, MD [facility]  . left salivary Left    removed    FAMILY HISTORY: family history includes Alzheimer's disease (age of onset: 59) in his father.  SOCIAL HISTORY:  reports that he quit  smoking about 10 years ago. He has a 32.00 pack-year smoking history. His smokeless tobacco use includes Chew. He reports that he does not drink alcohol or use drugs.  ALLERGIES: Patient has no known allergies.  MEDICATIONS:  Current Outpatient Prescriptions  Medication Sig Dispense Refill  . amLODipine (NORVASC) 10 MG tablet Take 1  tablet (10 mg total) by mouth every evening. 30 tablet 3  . aspirin EC 81 MG EC tablet Take 1 tablet (81 mg total) by mouth every evening.    . Cholecalciferol (VITAMIN D3) 2000 units TABS Take 2,000 Units by mouth daily with lunch.     . clopidogrel (PLAVIX) 75 MG tablet Take 1 tablet (75 mg total) by mouth every evening.    [name] lisinopril (PRINIVIL,ZESTRIL) 20 MG tablet Take 20 mg by mouth every evening.     . mirtazapine (REMERON) 15 MG tablet Take 1 tablet (15 mg total) by mouth at bedtime as needed (for sleep).    . Multiple Vitamins-Minerals (CENTRUM SILVER PO) Take 1 tablet by mouth daily with breakfast.     . vitamin C (ASCORBIC ACID) 500 MG tablet Take 500 mg by mouth 2 (two) times daily.    . DULoxetine (CYMBALTA) 30 MG capsule Take 1 capsule (30 mg total) by mouth daily. (Patient not taking: Reported on 05/21/2016) 30 capsule 1  . LORazepam (ATIVAN) 0.5 MG tablet Take 1 tablet (0.5 mg total) by mouth every 6 (six) hours as needed (Nausea or vomiting). (Patient not taking: Reported on 05/21/2016) 30 tablet 0  . magic mouthwash w/lidocaine SOLN Take 5 mLs by mouth 4 (four) times daily as needed for mouth pain. (Patient not taking: Reported on 05/21/2016) 120 mL 0  . ondansetron (ZOFRAN) 8 MG tablet Take 1 tablet (8 mg total) by mouth 2 (two) times daily. Take two times a day starting the day after chemo for 3 days. Then take two times a day as needed for nausea or vomiting. (Patient not taking: Reported on 05/21/2016) 30 tablet 1  . oxyCODONE (OXY IR/ROXICODONE) 5 MG immediate release tablet Take 1-2 tablets (5-10 mg total) by mouth every 6 (six) hours as needed for moderate pain or severe pain. (Patient not taking: Reported on 05/21/2016) 60 tablet 0  . polyethylene glycol (MIRALAX) packet Take 17 g by mouth daily as needed for moderate constipation. (Patient not taking: Reported on 05/21/2016)    . prochlorperazine (COMPAZINE) 10 MG tablet Take 1 tablet (10 mg total) by mouth every 6 (six)  hours as needed (Nausea or vomiting). (Patient not taking: Reported on 05/21/2016) 30 tablet 1  . senna-docusate (SENNA S) 8.6-50 MG tablet Take 2 tablets by mouth at bedtime as needed for moderate constipation. (Patient not taking: Reported on 05/21/2016)     No current facility-administered medications for this encounter.     REVIEW OF SYSTEMS:  A 10+ POINT REVIEW OF SYSTEMS WAS OBTAINED including neurology,  psychiatry, cardiac, respiratory, lymph, extremities, GI,  Musculoskeletal, constitutional,   Immunology,  HEENT.  All pertinent positives are noted in the HPI.  All others are negative.   PHYSICAL EXAM:  height is 6' (1.829 m) and weight is 191 lb 9.6 oz (86.9 kg). His temperature is 98 F (36.7 C). His blood pressure is 126/73 and his pulse is 82. His oxygen saturation is 100%.   General: Alert and oriented, in no acute distress  HEENT: Head is normocephalic. Extraocular movements are intact. Oropharynx is clear.  Neck: Neck is supple. (+) Superficial oval shaped mass that  is subcutaneous adjacent to the angle of left mandible, about 2 cm in size. Patient states he has had this for a very long time. Otherwise, no palpable cervical or supraclavicular lymphadenopathy. Heart: Regular in rate and rhythm with no rubs, or gallops. (+) Soft systolic murmur at the aortic region. Chest: Clear to auscultation bilaterally, with no rhonchi, wheezes, or rales. Abdomen: Soft, nontender, nondistended, with no rigidity or guarding. Extremities:  (+) Mild edema in his left ankle.  Lymphatics: see Neck Exam Skin: No concerning lesions. Vascular: (+) Right upper chest port-a-cath Musculoskeletal: symmetric strength and muscle tone throughout. No reproducible tenderness in the sacral region. Neurologic: Cranial nerves II through XII are grossly intact. No obvious focalities. Speech is fluent. Coordination is intact. Psychiatric: Judgment and insight are intact. Affect is appropriate.  ECOG = 1  0 -  Asymptomatic (Fully active, able to carry on all predisease activities without restriction)  1 - Symptomatic but completely ambulatory (Restricted in physically strenuous activity but ambulatory and able to carry out work of a light or sedentary nature. For example, light housework, office work)  2 - Symptomatic, <50% in bed during the day (Ambulatory and capable of all self care but unable to carry out any work activities. Up and about more than 50% of waking hours)  3 - Symptomatic, >50% in bed, but not bedbound (Capable of only limited self-care, confined to bed or chair 50% or more of waking hours)  4 - Bedbound (Completely disabled. Cannot carry on any self-care. Totally confined to bed or chair)  5 - Death   Eustace Pen MM, Creech RH, Tormey DC, et al. [phone]). "Toxicity and response criteria of the [facility]". [place] Oncol. 5 (6): 649-55   LABORATORY DATA:  Lab Results  Component Value Date   WBC 6.1 05/18/2016   HGB 10.5 (L) 05/18/2016   HCT 32.6 (L) 05/18/2016   MCV 86.9 05/18/2016   PLT 336 05/18/2016   CMP     Component Value Date/Time   NA 140 05/18/2016 0908   K 4.4 05/18/2016 0908   CL 102 04/23/2016 0511   CO2 27 05/18/2016 0908   GLUCOSE 82 05/18/2016 0908   BUN 11.0 05/18/2016 0908   CREATININE 0.7 05/18/2016 0908   CALCIUM 9.5 05/18/2016 0908   PROT 6.3 (L) 05/18/2016 0908   ALBUMIN 3.5 05/18/2016 0908   AST 22 05/18/2016 0908   ALT 27 05/18/2016 0908   ALKPHOS 107 05/18/2016 0908   BILITOT 0.43 05/18/2016 0908   GFRNONAA >60 04/23/2016 0511   GFRAA >60 04/23/2016 0511         RADIOGRAPHY: All PET images reviewed by me. Nm Pet Image Restag (ps) Skull Base To Thigh  Result Date: 05/17/2016 CLINICAL DATA:  Subsequent treatment strategy for Burkitt's lymphoma. EXAM: NUCLEAR MEDICINE PET SKULL BASE TO THIGH TECHNIQUE: 8.7 mCi F-18 FDG was injected intravenously. Full-ring PET imaging was performed from the skull base to thigh  after the radiotracer. CT data was obtained and used for attenuation correction and anatomic localization. FASTING BLOOD GLUCOSE:  Value: 107 mg/dl COMPARISON:  PET-CT 03/02/2016, 12/25/2015 FINDINGS: NECK No hypermetabolic lymph nodes in the neck. CHEST No hypermetabolic mediastinal or hilar nodes. No suspicious pulmonary nodules on the CT scan. ABDOMEN/PELVIS No evidence recurrence of the mesenteric hypermetabolic mass or LEFT inguinal nodal mass. No hypermetabolic adenopathy. There is new metabolic active in proximal stomach associated the large hiatal hernia. This is favored physiologic. SKELETON Mild metabolic activity associated with a lytic  lesion in the LEFT sacral ala with SUV max equal 35.3 decreased slightly from 6.3 on most recent comparison CT markedly reduced from SUV max on more remote PET-CT (12/25/2015). Potential pathologic fracture at this level (image 164, series 4). The activity may be related to the pathologic fracture rather than residual lymphoma. Likewise there is mild activity associated with a pathologic fracture the posterior LEFT rib at site of prior large lesion. IMPRESSION: 1. No clear evidence of active lymphoma on neck, chest, abdomen, and pelvis FDG PET exam. 2. Activity remaining in the LEFT sacral ala may relate to a pathologic fracture. No significant change from most recent exam. Similar findings in posterior LEFT rib. Electronically Signed   By: Suzy Bouchard M.D.   On: 05/17/2016 09:55      IMPRESSION/PLAN: 65 year-old gentleman with Stage IVB Burkitt's lymphoma, high grade   The patient is currently asymptomatic. Accordingly, I do not feel radiation treatment to be necessary at this time. Furthermore, his scan is favored to show a complete response to chemo and the low level of hypermetabolic activity to the sacrum is favored by radiology to be related to bone damage rather than active lymphoma.   I spoke briefly about radiotherapy with the patient and his wife. If  the patient needs radiation treatment if the future, he would be able to receive treatment at our [facility] clinic with to avoid a long commute. He would like a referral to Dr. Orlene Erm if reconsideration of radiation therapy is warranted in the future. We discussed symptoms that might warrant further workup in case of relapse.  We also discussed counseling services his him or his significant other, if needed. They currently decline seeing social work.   __________________________________________   Eppie Gibson, MD   This document serves as a record of services personally performed by Eppie Gibson, MD. It was created on her behalf by Arlyce Harman, a trained medical scribe. The creation of this record is based on the scribe's personal observations and the provider's statements to them. This document has been checked and approved by the attending provider.

## 2016-05-27 NOTE — Progress Notes (Signed)
[name]    HEMATOLOGY/ONCOLOGY CLINIC NOTE  Date of Service: 05/18/2016  Patient Care Team: Philemon Kingdom, MD as PCP - General (Internal Medicine) Jene Every, MD as Consulting Physician (Orthopedic Surgery)  CHIEF COMPLAINTS/PURPOSE OF CONSULTATION:  F/u for Burkitt's lymphoma  DIAGNOSIS Stage IVB Burkitt's lymphoma with t(8;14) translocation diagnosed 12/23/2015. This appears to be involving the sacrum causing sacral and root involvement with possible neurogenic bladder. PET CT scan as noted above shows extensive involvement with lymphoma. CSF negative for involvement. Bone marrow biopsy negative for involvement by lymphoma.   CURRENT TREATMENT The patient has completed 6 cycles of EPOCH -R and IT methotrexate x 4 doses starting with cycle 3 for CNS prophylaxis   HISTORY OF PRESENTING ILLNESS:  Please see my previous note for details  INTERVAL HISTORY  Erik Vaughn is here for a follow-up with his wife for reassessment of his disease status after completing his planned 6 cycles of R-EPOCH with IT MTX CNS prophylaxis his PET/CT scan shows No clear evidence of active lymphoma on neck, chest, abdomen, and pelvis on FDG PET exam. Questionable minimal activity in the left sacral Ala. I recommended he be evaluated by radiation oncology to consider focal radiation to this area of known disease. He is very happy with his response. Has no cutaneous of symptoms today. No fevers or chills. Energy level is good. Eating well. We discussed that he could discontinue his prophylactic antibiotics.     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   He smoked about 2 packs a day for 40 years.  SURGICAL HISTORY:  #1 left submandibular salivary gland benign tumor excision #2 port placement - 12/31/2015  SOCIAL  HISTORY: Social History   Social History  . Marital status: Married    Spouse name: N/A  . Number of children: N/A  . Years of education: N/A   Occupational History  . meter specialist    Social History Main Topics  . Smoking status: Former Smoker    Packs/day: 2.00    Years: 16.00    Quit date: 12/22/2005  . Smokeless tobacco: Current User    Types: Chew  . Alcohol use No  . Drug use: No  . Sexual activity: No   Other Topics Concern  . Not on file   Social History Narrative  . No narrative on file  smoked 2 packs of cigarettes per day for 40 years quit about 8-9 years ago. Denies significant alcohol use. 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  FAMILY HISTORY:  Sister had breast cancer at age 68 years. Unknown if she had genetic testing One maternal uncle had melanoma Second maternal uncle had pancreatic cancer under the age of 58yr Paternal uncle with prostate cancer Dad had prostate cancer at age 2 years   ALLERGIES:  has No Known Allergies.  MEDICATIONS:  Current Outpatient Prescriptions  Medication Sig Dispense Refill  . amLODipine (NORVASC) 10 MG tablet Take 1 tablet (10 mg total) by mouth every evening. 30 tablet 3  . aspirin EC 81 MG EC tablet Take 1 tablet (81 mg total) by mouth every evening.    . Cholecalciferol (VITAMIN D3) 2000 units TABS Take  2,000 Units by mouth daily with lunch.     . clopidogrel (PLAVIX) 75 MG tablet Take 1 tablet (75 mg total) by mouth every evening.    . DULoxetine (CYMBALTA) 30 MG capsule Take 1 capsule (30 mg total) by mouth daily. (Patient not taking: Reported on 05/21/2016) 30 capsule 1  . lisinopril (PRINIVIL,ZESTRIL) 20 MG tablet Take 20 mg by mouth every evening.     [name] LORazepam (ATIVAN) 0.5 MG tablet Take 1 tablet (0.5 mg total) by mouth every 6 (six)  hours as needed (Nausea or vomiting). (Patient not taking: Reported on 05/21/2016) 30 tablet 0  . magic mouthwash w/lidocaine SOLN Take 5 mLs by mouth 4 (four) times daily as needed for mouth pain. (Patient not taking: Reported on 05/21/2016) 120 mL 0  . mirtazapine (REMERON) 15 MG tablet Take 1 tablet (15 mg total) by mouth at bedtime as needed (for sleep).    . Multiple Vitamins-Minerals (CENTRUM SILVER PO) Take 1 tablet by mouth daily with breakfast.     . ondansetron (ZOFRAN) 8 MG tablet Take 1 tablet (8 mg total) by mouth 2 (two) times daily. Take two times a day starting the day after chemo for 3 days. Then take two times a day as needed for nausea or vomiting. (Patient not taking: Reported on 05/21/2016) 30 tablet 1  . oxyCODONE (OXY IR/ROXICODONE) 5 MG immediate release tablet Take 1-2 tablets (5-10 mg total) by mouth every 6 (six) hours as needed for moderate pain or severe pain. (Patient not taking: Reported on 05/21/2016) 60 tablet 0  . polyethylene glycol (MIRALAX) packet Take 17 g by mouth daily as needed for moderate constipation. (Patient not taking: Reported on 05/21/2016)    . prochlorperazine (COMPAZINE) 10 MG tablet Take 1 tablet (10 mg total) by mouth every 6 (six) hours as needed (Nausea or vomiting). (Patient not taking: Reported on 05/21/2016) 30 tablet 1  . senna-docusate (SENNA S) 8.6-50 MG tablet Take 2 tablets by mouth at bedtime as needed for moderate constipation. (Patient not taking: Reported on 05/21/2016)    . vitamin C (ASCORBIC ACID) 500 MG tablet Take 500 mg by mouth 2 (two) times daily.     No current facility-administered medications for this visit.     REVIEW OF SYSTEMS:    10 Point review of Systems was done is negative except as noted above.  PHYSICAL EXAMINATION: ECOG PERFORMANCE STATUS: 1 - Symptomatic but completely ambulatory  . Vitals:   05/18/16 1002  BP: 129/61  Pulse: 89  Resp: 18  Temp: 98.1 F (36.7 C)   Filed Weights   05/18/16 1002    Weight: 191 lb 14.4 oz (87 kg)   .Body mass index is 26.03 kg/m.  GENERAL:alert, in no acute distress and comfortable SKIN: skin color, texture, turgor are normal, no rashes or significant lesions EYES: normal, conjunctiva are pink and non-injected, sclera clear OROPHARYNX:no exudate, no erythema and lips, buccal mucosa, and tongue normal  NECK: supple, no JVD, thyroid normal size, non-tender, without nodularity LYMPH:  no palpable lymphadenopathy in the cervical, axillary or inguinal LUNGS: clear to auscultation with normal respiratory effort, large left anterior chest wall mass noted appears nontender and fixed. HEART: regular rate & rhythm,  no murmurs and no lower extremity edema ABDOMEN: abdomen soft, non-tender, normoactive bowel sounds  Musculoskeletal: no cyanosis of digits and no clubbing  PSYCH: alert & oriented x 3 with fluent speech NEURO: no focal motor/sensory deficits  LABORATORY DATA:  I have reviewed the data as  listed . CBC    Component Value Date/Time   WBC 6.1 05/18/2016 0908   WBC 9.8 04/23/2016 0511   RBC 3.75 (L) 05/18/2016 0908   RBC 3.50 (L) 04/23/2016 0511   HGB 10.5 (L) 05/18/2016 0908   HCT 32.6 (L) 05/18/2016 0908   PLT 336 05/18/2016 0908   MCV 86.9 05/18/2016 0908   MCH 28.0 05/18/2016 0908   MCH 28.3 04/23/2016 0511   MCHC 32.2 05/18/2016 0908   MCHC 32.7 04/23/2016 0511   RDW 18.0 (H) 05/18/2016 0908   LYMPHSABS 1.7 05/18/2016 0908   MONOABS 0.4 05/18/2016 0908   EOSABS 0.2 05/18/2016 0908   BASOSABS 0.0 05/18/2016 0908   . CMP Latest Ref Rng & Units 05/18/2016 04/23/2016 04/22/2016  Glucose 70 - 140 mg/dl 82 129(H) 149(H)  BUN 7.0 - 26.0 mg/dL 11.0 18 17  Creatinine 0.7 - 1.3 mg/dL 0.7 0.66 0.70  Sodium 136 - 145 mEq/L 140 138 139  Potassium 3.5 - 5.1 mEq/L 4.4 3.7 4.0  Chloride 101 - 111 mmol/L - 102 104  CO2 22 - 29 mEq/L _0 Calcium 8.4 - 10.4 mg/dL 9.5 9.3 9.4  Total Protein 6.4 - 8.3 g/dL 6.3(L) - -  Total Bilirubin  0.20 - 1.20 mg/dL 0.43 - -  Alkaline Phos 40 - 150 U/L 107 - -  AST 5 - 34 U/L 22 - -  ALT 0 - 55 U/L 27 - -    . Lab Results  Component Value Date   LDH 196 05/18/2016     RADIOGRAPHIC STUDIES: I have personally reviewed the radiological images as listed and agreed with the findings in the report.  ASSESSMENT & PLAN:   65 year old Caucasian male with  #1 Stage IVB Burkitt's lymphoma with t(8;14) translocation This appears to be involving the sacrum causing sacral and root involvement with possible neurogenic bladder. PET CT scan as noted above shows extensive involvement with lymphoma. CSF negative for involvement. Bone marrow biopsy negative for involvement by lymphoma. PET/CT scan after 3 cycles of EPOCH-R  on 03/02/2016  showed significant interval metabolic response.  Patient has completed cycle 6 of EPOCH-R and tolerated it well with no prohibitive toxicities. Grade 1 fatigue.  He has had some episodes of fevers including a periodontal abscess,  non-neutropenic fevers - unclear etiology, recent Escherichia coli urinary tract infection x 2 and Clostridium difficile diarrhea.  PET CT scan done after completion of this planned chemotherapy shows no clear evidence of residual lymphoma . Some borderline FDG avidity in the left sacral ala ?pathologic fracture vs residual lymphoma PLAN - reviewed images and discussed PET/CT scan findings in details with the patient and his wife . - day were very glad as expected with the results of his treatment . - I discussed and recommended radiation oncology evaluation for consideration of ISRT to his sacrum . He has some residual FDG avidity and had previously known disease to consider consolidative radiation . - we discussed the plan for survivorship follow-up and important signs and symptoms to look out for.  -We discussed importance of ongoing infection prevention strategies.  #2 normocytic anemia likely related to chemotherapy.  No  evidence of bleeding.  - anticipate anemia will improve of chemotherapy .  #3 Escherichia coli urinary tract infection - recurrent completed antibiotic.Was on Bactrim prophylaxis to avoid recurrent infections especially in the setting of neutropenia .  plan -Bactrim discontinued  #4 diarrhea-C. difficile colitis - has completed 14 days of treatment. Was on vancomycin  twice daily for prophylaxis. -Vancomycin discontinued  #5 neutropenia due to recent chemotherapy. Resolved with Neulasta.  #4 left sacral ala and S1 fracture. L5-S1 and S1-S2 left-sided nerve root compression. #5 neoplasm related pain - well-controlled.  Minimal use of oxycodone.  #6 Hypertension, dyslipidemia, peripheral arterial disease -Continue management as per primary care physician.  #7 ex-smoker with 80-pack-year history of smoking.  Radiation oncology referral to consider ISRT to sacral NHL RTC with Dr Irene Limbo in 2 months with labs   I spent 30 minutes counseling the patient face to face. The total time spent in the appointment was 40 minutes and more than 50% was on counseling and direct patient cares and co-ordinating inpatient hospitalization logistics    Sullivan Lone MD [place] AAHIVMS [facility] Hematology/Oncology Physician [facility]  (Office):       [phone] (Work cell):  [phone] (Fax):           [phone]

## 2016-06-09 ENCOUNTER — Encounter: Payer: Self-pay | Admitting: Radiation Oncology

## 2016-06-09 NOTE — Progress Notes (Signed)
Paperwork ([employer]) received 1/9, given to nursing 1/10

## 2016-06-14 ENCOUNTER — Ambulatory Visit: Payer: Self-pay

## 2016-06-14 ENCOUNTER — Ambulatory Visit: Payer: PRIVATE HEALTH INSURANCE | Admitting: Radiation Oncology

## 2016-06-14 ENCOUNTER — Encounter: Payer: Self-pay | Admitting: Radiation Oncology

## 2016-06-14 NOTE — Progress Notes (Signed)
Paperwork ([employer]) received, nurse advised patient not being treated by Dr Isidore Moos. Will let insurance company know. 1/15

## 2016-07-13 ENCOUNTER — Other Ambulatory Visit: Payer: Self-pay | Admitting: *Deleted

## 2016-07-13 ENCOUNTER — Encounter: Payer: Self-pay | Admitting: Hematology

## 2016-07-13 ENCOUNTER — Ambulatory Visit (HOSPITAL_BASED_OUTPATIENT_CLINIC_OR_DEPARTMENT_OTHER): Payer: PRIVATE HEALTH INSURANCE | Admitting: Hematology

## 2016-07-13 ENCOUNTER — Other Ambulatory Visit (HOSPITAL_BASED_OUTPATIENT_CLINIC_OR_DEPARTMENT_OTHER): Payer: PRIVATE HEALTH INSURANCE

## 2016-07-13 ENCOUNTER — Telehealth: Payer: Self-pay | Admitting: Hematology

## 2016-07-13 VITALS — BP 148/66 | HR 72 | Temp 98.4°F | Resp 18 | Ht 72.0 in | Wt 193.8 lb

## 2016-07-13 DIAGNOSIS — G620 Drug-induced polyneuropathy: Secondary | ICD-10-CM

## 2016-07-13 DIAGNOSIS — C8378 Burkitt lymphoma, lymph nodes of multiple sites: Secondary | ICD-10-CM

## 2016-07-13 DIAGNOSIS — Z8572 Personal history of non-Hodgkin lymphomas: Secondary | ICD-10-CM

## 2016-07-13 DIAGNOSIS — D649 Anemia, unspecified: Secondary | ICD-10-CM

## 2016-07-13 DIAGNOSIS — T451X5A Adverse effect of antineoplastic and immunosuppressive drugs, initial encounter: Secondary | ICD-10-CM

## 2016-07-13 LAB — CBC & DIFF AND RETIC
BASO%: 0.6 % (ref 0.0–2.0)
BASOS ABS: 0 10*3/uL (ref 0.0–0.1)
EOS ABS: 0.2 10*3/uL (ref 0.0–0.5)
EOS%: 5.1 % (ref 0.0–7.0)
HCT: 37.7 % — ABNORMAL LOW (ref 38.4–49.9)
HEMOGLOBIN: 12.5 g/dL — AB (ref 13.0–17.1)
IMMATURE RETIC FRACT: 6.7 % (ref 3.00–10.60)
LYMPH%: 32.7 % (ref 14.0–49.0)
MCH: 26.9 pg — ABNORMAL LOW (ref 27.2–33.4)
MCHC: 33.2 g/dL (ref 32.0–36.0)
MCV: 81.3 fL (ref 79.3–98.0)
MONO#: 0.4 10*3/uL (ref 0.1–0.9)
MONO%: 11.7 % (ref 0.0–14.0)
NEUT%: 49.9 % (ref 39.0–75.0)
NEUTROS ABS: 1.6 10*3/uL (ref 1.5–6.5)
Platelets: 235 10*3/uL (ref 140–400)
RBC: 4.64 10*6/uL (ref 4.20–5.82)
RDW: 13.3 % (ref 11.0–14.6)
RETIC %: 0.86 % (ref 0.80–1.80)
RETIC CT ABS: 39.9 10*3/uL (ref 34.80–93.90)
WBC: 3.2 10*3/uL — AB (ref 4.0–10.3)
lymph#: 1 10*3/uL (ref 0.9–3.3)

## 2016-07-13 LAB — COMPREHENSIVE METABOLIC PANEL
ALBUMIN: 4.1 g/dL (ref 3.5–5.0)
ALK PHOS: 130 U/L (ref 40–150)
ALT: 20 U/L (ref 0–55)
AST: 23 U/L (ref 5–34)
Anion Gap: 10 mEq/L (ref 3–11)
BILIRUBIN TOTAL: 0.46 mg/dL (ref 0.20–1.20)
BUN: 10 mg/dL (ref 7.0–26.0)
CALCIUM: 9.9 mg/dL (ref 8.4–10.4)
CO2: 26 mEq/L (ref 22–29)
CREATININE: 0.8 mg/dL (ref 0.7–1.3)
Chloride: 105 mEq/L (ref 98–109)
EGFR: >90 mL/min/{1.73_m2} (ref >90)
GLUCOSE: 98 mg/dL (ref 70–140)
POTASSIUM: 4.1 meq/L (ref 3.5–5.1)
SODIUM: 141 meq/L (ref 136–145)
TOTAL PROTEIN: 6.7 g/dL (ref 6.4–8.3)

## 2016-07-13 LAB — LACTATE DEHYDROGENASE: LDH: 237 U/L (ref 125–245)

## 2016-07-13 MED ORDER — OXYCODONE HCL 5 MG PO TABS: 5.0000 mg | ORAL_TABLET | Freq: Four times a day (QID) | ORAL | 0 refills | Status: DC | PRN

## 2016-07-13 NOTE — Telephone Encounter (Signed)
Appointments scheduled per 2/13 LOS. Patient given AVS report and calendars with future scheduled appointments. °

## 2016-07-14 ENCOUNTER — Telehealth: Payer: Self-pay | Admitting: *Deleted

## 2016-07-14 DIAGNOSIS — C859A Non-Hodgkin lymphoma, unspecified, in remission: Secondary | ICD-10-CM | POA: Insufficient documentation

## 2016-07-14 DIAGNOSIS — C8590 Non-Hodgkin lymphoma, unspecified, unspecified site: Secondary | ICD-10-CM | POA: Insufficient documentation

## 2016-07-14 NOTE — Telephone Encounter (Signed)
SW IR regarding holding plavix prior to [facility] removal.  Per Dr. Irene Limbo he does not feel the patient needs to be off plavix prior to removal, but will be ok if that is IR policy.   Per Dr. Irene Limbo there is no rush to remove PAC, so ok to hold plavix for 5 days if necessary.

## 2016-07-16 ENCOUNTER — Other Ambulatory Visit: Payer: Self-pay | Admitting: Radiology

## 2016-07-17 NOTE — Progress Notes (Signed)
Erik Vaughn    HEMATOLOGY/ONCOLOGY CLINIC NOTE  Date of Service: 07/13/2016  Patient Care Team: Ernestene Kiel, MD as PCP - General (Internal Medicine) [name], MD as Consulting Physician (Orthopedic Surgery)  CHIEF COMPLAINTS/PURPOSE OF CONSULTATION:  F/u for Burkitt's lymphoma  DIAGNOSIS BUrkitts Lymphoma currently in remission  Stage IVB Burkitt's lymphoma with t(8;14) translocation diagnosed 12/23/2015. This appears to be involving the sacrum causing sacral and root involvement with possible neurogenic bladder. PET CT scan as noted above shows extensive involvement with lymphoma. CSF negative for involvement. Bone marrow biopsy negative for involvement by lymphoma.  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 PET CT scan as noted above shows extensive involvement with lymphoma. CSF negative for involvement. Bone marrow biopsy negative for involvement by lymphoma. PET/CT scan after 3 cycles of EPOCH-R  on 03/02/2016  showed significant interval metabolic response.  Patient has completed cycle 6 of EPOCH-R and tolerated it well with no prohibitive toxicities. Grade 1 fatigue.  He has had some episodes of fevers including a periodontal abscess,  non-neutropenic fevers - unclear etiology, recent Escherichia coli urinary tract infection x 2 and Clostridium difficile diarrhea.  PET CT scan done after completion of this planned chemotherapy shows no clear evidence of residual lymphoma . Some borderline FDG avidity in the left sacral ala ?pathologic fracture vs residual lymphoma  Patient was evaluated by radiation oncology - no RT recommended to sacral lesion.  CURRENT TREATMENT -active surveillance  Previous treatment The patient has completed 6 cycles of EPOCH -R and IT methotrexate x 4 doses starting with cycle 3 for CNS prophylaxis   HISTORY OF  PRESENTING ILLNESS:  Please see my previous note for details  INTERVAL HISTORY  Erik Vaughn is here for a follow-up with his wife for f/u for his Burkitts lymphoma. He notes not acute new symptoms. No fevers/chills/nightsweat/weight loss. He is eating well and gained 2 lbs since last clinic visit. Has not noted any enlarged LN. No abd pain. No new back pain. Minimal grade 1 neuropathy in his toes. Has not been taking his Cymbalta. He would like to have the port removed which is quite reasonable.   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   He smoked about 2 packs a day for 40 years.  SURGICAL HISTORY:  #1 left submandibular salivary gland benign tumor excision #2 port placement - 12/31/2015  SOCIAL HISTORY: Social History   Social History  . Marital status: Married    Spouse name: N/A  . Number of children: N/A  . Years of education: N/A   Occupational History  . meter specialist    Social History Main Topics  . Smoking status: Former Smoker    Packs/day: 2.00    Years: 16.00    Quit date: 12/22/2005  . Smokeless tobacco: Current User    Types: Chew  . Alcohol use No  . Drug use: No  . Sexual activity: No   Other Topics Concern  . Not on file   Social History Narrative  . No narrative on file  smoked 2 packs of cigarettes per day for 40 years quit about 8-9 years ago. Denies significant alcohol use. 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  FAMILY HISTORY:  Sister had breast cancer at age 418 years. Unknown if she had genetic testing One maternal uncle had melanoma Second maternal uncle had pancreatic cancer  under the age of 45yrPaternal uncle with prostate cancer Dad had prostate cancer at age 4141years   ALLERGIES:  has No Known Allergies.  MEDICATIONS:  Current Outpatient Prescriptions  Medication Sig Dispense Refill  . amLODipine (NORVASC) 10 MG tablet Take 1 tablet (10 mg total) by mouth every evening. 30 tablet 3  . aspirin EC 81 MG EC tablet Take 1 tablet (81 mg total) by mouth every evening.    . Cholecalciferol (VITAMIN D3) 2000 units TABS Take 2,000 Units by mouth daily with lunch.     . clopidogrel (PLAVIX) 75 MG tablet Take 1 tablet (75 mg total) by mouth every evening.    . DULoxetine (CYMBALTA) 30 MG capsule Take 1 capsule (30 mg total) by mouth daily. 30 capsule 1  . lisinopril (PRINIVIL,ZESTRIL) 20 MG tablet Take 20 mg by mouth every evening.     .Erik KitchenLORazepam (ATIVAN) 0.5 MG tablet Take 1 tablet (0.5 mg total) by mouth every 6 (six) hours as needed (Nausea or vomiting). 30 tablet 0  . magic mouthwash w/lidocaine SOLN Take 5 mLs by mouth 4 (four) times daily as needed for mouth pain. 120 mL 0  . mirtazapine (REMERON) 15 MG tablet Take 1 tablet (15 mg total) by mouth at bedtime as needed (for sleep).    . Multiple Vitamins-Minerals (CENTRUM SILVER PO) Take 1 tablet by mouth daily with breakfast.     . ondansetron (ZOFRAN) 8 MG tablet Take 1 tablet (8 mg total) by mouth 2 (two) times daily. Take two times a day starting the day after chemo for 3 days. Then take two times a day as needed for nausea or vomiting. 30 tablet 1  . oxyCODONE (OXY IR/ROXICODONE) 5 MG immediate release tablet Take 1-2 tablets (5-10 mg total) by mouth every 6 (six) hours as needed for moderate pain or severe pain. 60 tablet 0  . polyethylene glycol (MIRALAX) packet Take 17 g by mouth daily as needed for moderate constipation.    . prochlorperazine (COMPAZINE) 10 MG tablet Take 1 tablet (10 mg total) by mouth every 6 (six) hours as needed (Nausea or vomiting). 30 tablet 1  . senna-docusate (SENNA S) 8.6-50 MG  tablet Take 2 tablets by mouth at bedtime as needed for moderate constipation.    . vitamin C (ASCORBIC ACID) 500 MG tablet Take 500 mg by mouth 2 (two) times daily.     No current facility-administered medications for this visit.     REVIEW OF SYSTEMS:    10 Point review of Systems was done is negative except as noted above.  PHYSICAL EXAMINATION: ECOG PERFORMANCE STATUS: 1 - Symptomatic but completely ambulatory  . Vitals:   07/13/16 0920  BP: (!) 148/66  Pulse: 72  Resp: 18  Temp: 98.4 F (36.9 C)   Filed Weights   07/13/16 0920  Weight: 193 lb 12.8 oz (87.9 kg)   .Body mass index is 26.28 kg/m.  GENERAL:alert, in no acute distress and comfortable SKIN: skin color, texture, turgor are normal, no rashes or significant lesions EYES: normal, conjunctiva are pink and non-injected, sclera clear OROPHARYNX:no exudate, no erythema and lips, buccal mucosa, and tongue normal  NECK: supple, no JVD, thyroid normal size,  non-tender, without nodularity LYMPH:  no palpable lymphadenopathy in the cervical, axillary or inguinal LUNGS: clear to auscultation with normal respiratory effort, large left anterior chest wall mass noted appears nontender and fixed. HEART: regular rate & rhythm,  no murmurs and no lower extremity edema ABDOMEN: abdomen soft, non-tender, normoactive bowel sounds , no palpable hepatosplenomegaly. Musculoskeletal: no cyanosis of digits and no clubbing  PSYCH: alert & oriented x 3 with fluent speech NEURO: no focal motor/sensory deficits  LABORATORY DATA:  I have reviewed the data as listed . CBC    Component Value Date/Time   WBC 3.2 (L) 07/13/2016 0908   WBC 9.8 04/23/2016 0511   RBC 4.64 07/13/2016 0908   RBC 3.50 (L) 04/23/2016 0511   HGB 12.5 (L) 07/13/2016 0908   HCT 37.7 (L) 07/13/2016 0908   PLT 235 07/13/2016 0908   MCV 81.3 07/13/2016 0908   MCH 26.9 (L) 07/13/2016 0908   MCH 28.3 04/23/2016 0511   MCHC 33.2 07/13/2016 0908   MCHC 32.7  04/23/2016 0511   RDW 13.3 07/13/2016 0908   LYMPHSABS 1.0 07/13/2016 0908   MONOABS 0.4 07/13/2016 0908   EOSABS 0.2 07/13/2016 0908   BASOSABS 0.0 07/13/2016 0908   . CMP Latest Ref Rng & Units 07/13/2016 05/18/2016 04/23/2016  Glucose 70 - 140 mg/dl 98 82 129(H)  BUN 7.0 - 26.0 mg/dL 10.0 11.0 18  Creatinine 0.7 - 1.3 mg/dL 0.8 0.7 0.66  Sodium 136 - 145 mEq/L 141 140 138  Potassium 3.5 - 5.1 mEq/L 4.1 4.4 3.7  Chloride 101 - 111 mmol/L - - 102  CO2 22 - 29 mEq/L _0 Calcium 8.4 - 10.4 mg/dL 9.9 9.5 9.3  Total Protein 6.4 - 8.3 g/dL 6.7 6.3(L) -  Total Bilirubin 0.20 - 1.20 mg/dL 0.46 0.43 -  Alkaline Phos 40 - 150 U/L 130 107 -  AST 5 - 34 U/L 23 22 -  ALT 0 - 55 U/L 20 27 -    . Lab Results  Component Value Date   LDH 237 07/13/2016     RADIOGRAPHIC STUDIES: I have personally reviewed the radiological images as listed and agreed with the findings in the report.  ASSESSMENT & PLAN:   66 year old Caucasian male with  #1 Stage IVB Burkitt's lymphoma with t(8;14) translocation Patient has completed cycle 6 of EPOCH-R and CNS prophylaxis with IT MTX - tolerated it well with no prohibitive toxicities.   PLAN -no consolidative RT recommended by radiation oncology. -patient has no clinical or lab evidence of lymphoma recurrence /progression at this time. LDH WNL -no indication for additional treatment at this time. -port-a cath removal requested as per patients preference. -no significant residual toxicities at this time.  #2 normocytic anemia likely related to chemotherapy.  No evidence of bleeding.  -improved and nearly resolved off chemotherapy.  #3 left sacral ala and S1 fracture. L5-S1 and S1-S2 left-sided nerve root compression. #5 neoplasm related pain -  -resolved   #4 Hypertension, dyslipidemia, peripheral arterial disease -Continue management as per primary care physician.  #5 ex-smoker with 80-pack-year history of smoking.   Port-a-cath  removal by IR RTC with Dr kale in 3 months with labs. Earlier if any new concerns.  I spent 20 minutes counseling the patient face to face. The total time spent in the appointment was 25 minutes and more than 50% was on counseling and direct patient cares and co-ordinating inpatient hospitalization logistics    Sullivan Lone MD [place] AAHIVMS [facility] Hematology/Oncology  Physician [facility]  (Office):       [phone] (Work cell):  [phone] (Fax):           [phone]

## 2016-07-19 ENCOUNTER — Other Ambulatory Visit: Payer: Self-pay | Admitting: Radiology

## 2016-07-20 ENCOUNTER — Encounter (HOSPITAL_COMMUNITY): Payer: Self-pay

## 2016-07-20 ENCOUNTER — Ambulatory Visit (HOSPITAL_COMMUNITY)
Admission: RE | Admit: 2016-07-20 | Discharge: 2016-07-20 | Disposition: A | Discharge status: Home / Self Care | Payer: PRIVATE HEALTH INSURANCE | Source: Ambulatory Visit | Attending: Hematology | Admitting: Hematology

## 2016-07-20 DIAGNOSIS — Z452 Encounter for adjustment and management of vascular access device: Secondary | ICD-10-CM | POA: Diagnosis present

## 2016-07-20 DIAGNOSIS — C8378 Burkitt lymphoma, lymph nodes of multiple sites: Secondary | ICD-10-CM | POA: Insufficient documentation

## 2016-07-20 HISTORY — PX: IR GENERIC HISTORICAL: IMG1180011

## 2016-07-20 LAB — CBC WITH DIFFERENTIAL/PLATELET
Basophils Absolute: 0 10*3/uL (ref 0.0–0.1)
Basophils Relative: 1 %
EOS PCT: 6 %
Eosinophils Absolute: 0.2 10*3/uL (ref 0.0–0.7)
HCT: 40.4 % (ref 39.0–52.0)
HEMOGLOBIN: 13.5 g/dL (ref 13.0–17.0)
LYMPHS ABS: 1.2 10*3/uL (ref 0.7–4.0)
LYMPHS PCT: 33 %
MCH: 26.8 pg (ref 26.0–34.0)
MCHC: 33.4 g/dL (ref 30.0–36.0)
MCV: 80.3 fL (ref 78.0–100.0)
MONOS PCT: 11 %
Monocytes Absolute: 0.4 10*3/uL (ref 0.1–1.0)
Neutro Abs: 1.8 10*3/uL (ref 1.7–7.7)
Neutrophils Relative %: 49 %
Platelets: 255 10*3/uL (ref 150–400)
RBC: 5.03 MIL/uL (ref 4.22–5.81)
RDW: 13.5 % (ref 11.5–15.5)
WBC: 3.7 10*3/uL — AB (ref 4.0–10.5)

## 2016-07-20 LAB — PROTIME-INR
INR: 1
Prothrombin Time: 13.2 seconds (ref 11.4–15.2)

## 2016-07-20 MED ORDER — SODIUM CHLORIDE 0.9 % IV SOLN
INTRAVENOUS | Status: DC
  Administered 2016-07-20: 10:00:00 via INTRAVENOUS

## 2016-07-20 MED ORDER — MIDAZOLAM HCL 2 MG/2ML IJ SOLN
INTRAMUSCULAR | Status: AC | PRN
  Administered 2016-07-20: 1 mg via INTRAVENOUS

## 2016-07-20 MED ORDER — FENTANYL CITRATE (PF) 100 MCG/2ML IJ SOLN
INTRAMUSCULAR | Status: AC | PRN
  Administered 2016-07-20: 50 ug via INTRAVENOUS

## 2016-07-20 MED ORDER — CEFAZOLIN SODIUM-DEXTROSE 2-4 GM/100ML-% IV SOLN
2.0000 g | INTRAVENOUS | Status: AC
  Administered 2016-07-20: 2 g via INTRAVENOUS
  Filled 2016-07-20: qty 100

## 2016-07-20 MED ORDER — MIDAZOLAM HCL 2 MG/2ML IJ SOLN
INTRAMUSCULAR | Status: AC
  Filled 2016-07-20: qty 4

## 2016-07-20 MED ORDER — FENTANYL CITRATE (PF) 100 MCG/2ML IJ SOLN
INTRAMUSCULAR | Status: AC
  Filled 2016-07-20: qty 4

## 2016-07-20 MED ORDER — LIDOCAINE-EPINEPHRINE (PF) 2 %-1:200000 IJ SOLN
INTRAMUSCULAR | Status: AC
  Filled 2016-07-20: qty 20

## 2016-07-20 NOTE — Procedures (Signed)
Interventional Radiology Procedure Note  Procedure: Explantation right chest portacatheter  Complications: None immediate  Estimated Blood Loss: None  Recommendations: - DC home - Routine wound care  Signed,  Criselda Peaches, MD

## 2016-07-20 NOTE — Discharge Instructions (Signed)
Moderate Conscious Sedation, Adult, Care After These instructions provide you with information about caring for yourself after your procedure. Your health care provider may also give you more specific instructions. Your treatment has been planned according to current medical practices, but problems sometimes occur. Call your health care provider if you have any problems or questions after your procedure. What can I expect after the procedure? After your procedure, it is common:  To feel sleepy for several hours.  To feel clumsy and have poor balance for several hours.  To have poor judgment for several hours.  To vomit if you eat too soon. Follow these instructions at home: For at least 24 hours after the procedure:   Do not:  Participate in activities where you could fall or become injured.  Drive.  Use heavy machinery.  Drink alcohol.  Take sleeping pills or medicines that cause drowsiness.  Make important decisions or sign legal documents.  Take care of children on your own.  Rest. Eating and drinking  Follow the diet recommended by your health care provider.  If you vomit:  Drink water, juice, or soup when you can drink without vomiting.  Make sure you have little or no nausea before eating solid foods. General instructions  Have a responsible adult stay with you until you are awake and alert.  Take over-the-counter and prescription medicines only as told by your health care provider.  If you smoke, do not smoke without supervision.  Keep all follow-up visits as told by your health care provider. This is important. Contact a health care provider if:  You keep feeling nauseous or you keep vomiting.  You feel light-headed.  You develop a rash.  You have a fever. Get help right away if:  You have trouble breathing. This information is not intended to replace advice given to you by your health care provider. Make sure you discuss any questions you have  with your health care provider. Document Released: 03/07/2013 Document Revised: 10/20/2015 Document Reviewed: 09/06/2015 Elsevier Interactive Patient Education  2017 [employer].   Site- care- Remove dressing after 24 hours, shower.

## 2016-07-20 NOTE — H&P (Signed)
Referring Physician(s): Brunetta Genera  Supervising Physician: Jacqulynn Cadet  Patient Status:  Erik Vaughn OP  Chief Complaint:  "I'm getting my port out"  Subjective: Patient familiar to IR service from prior left chest wall mass biopsy in July 2017, bone marrow biopsy in August 2017 along with Port-A-Cath placement. He has a history of Burkitt's lymphoma and has had prior chemotherapy. He is currently in clinical remission and presents today for Port-A-Cath removal. He is currently asymptomatic. Past Medical History:  Diagnosis Date  . Cancer ([place])    Burkett Lymphoma  . Hypertension   . PAD (peripheral artery disease) ([facility]) left leg   Past Surgical History:  Procedure Laterality Date  . IR GENERIC HISTORICAL  12/31/2015   IR FLUORO GUIDE CV LINE RIGHT 12/31/2015 Arne Cleveland, MD [facility]-INTERV RAD  . IR GENERIC HISTORICAL  12/31/2015   IR US GUIDE VASC ACCESS RIGHT 12/31/2015 Arne Cleveland, MD [facility]-INTERV RAD  . left salivary Left    removed      Allergies: Patient has no known allergies.  Medications: Prior to Admission medications   Medication Sig Start Date End Date Taking? Authorizing Provider  amLODipine (NORVASC) 10 MG tablet Take 1 tablet (10 mg total) by mouth every evening. 04/23/16  Yes Chauncey Cruel, MD  Cholecalciferol (VITAMIN D3) 2000 units TABS Take 2,000 Units by mouth daily with lunch.    Yes Historical Provider, MD  lisinopril (PRINIVIL,ZESTRIL) 20 MG tablet Take 20 mg by mouth every evening.    Yes Historical Provider, MD  Multiple Vitamins-Minerals (CENTRUM SILVER PO) Take 1 tablet by mouth daily with breakfast.    Yes Historical Provider, MD  oxyCODONE (OXY IR/ROXICODONE) 5 MG immediate release tablet Take 1-2 tablets (5-10 mg total) by mouth every 6 (six) hours as needed for moderate pain or severe pain. 07/13/16  Yes Brunetta Genera, MD  aspirin EC 81 MG EC tablet Take 1 tablet (81 mg total) by mouth every evening. 04/03/16   Brunetta Genera,  MD  clopidogrel (PLAVIX) 75 MG tablet Take 1 tablet (75 mg total) by mouth every evening. 04/03/16   Brunetta Genera, MD  LORazepam (ATIVAN) 0.5 MG tablet Take 1 tablet (0.5 mg total) by mouth every 6 (six) hours as needed (Nausea or vomiting). 03/02/16   Brunetta Genera, MD  magic mouthwash w/lidocaine SOLN Take 5 mLs by mouth 4 (four) times daily as needed for mouth pain. 04/28/16   Brunetta Genera, MD  mirtazapine (REMERON) 15 MG tablet Take 1 tablet (15 mg total) by mouth at bedtime as needed (for sleep). 05/18/16   Brunetta Genera, MD  ondansetron (ZOFRAN) 8 MG tablet Take 1 tablet (8 mg total) by mouth 2 (two) times daily. Take two times a day starting the day after chemo for 3 days. Then take two times a day as needed for nausea or vomiting. 01/05/16   Brunetta Genera, MD  polyethylene glycol [facility]) packet Take 17 g by mouth daily as needed for moderate constipation. 02/08/16   Geradine Girt, DO  prochlorperazine (COMPAZINE) 10 MG tablet Take 1 tablet (10 mg total) by mouth every 6 (six) hours as needed (Nausea or vomiting). 01/05/16   Brunetta Genera, MD  senna-docusate (SENNA S) 8.6-50 MG tablet Take 2 tablets by mouth at bedtime as needed for moderate constipation. 02/08/16   Geradine Girt, DO  vitamin C (ASCORBIC ACID) 500 MG tablet Take 500 mg by mouth 2 (two) times daily.    Historical  Provider, MD     Vital Signs: BP 139/65 (BP Location: Right Arm)   Pulse 72   Temp 98.2 F (36.8 C) (Oral)   Resp 18   Ht 6' (1.829 m)   Wt 193 lb 12.8 oz (87.9 kg)   SpO2 99%   BMI 26.28 kg/m   Physical Exam Awake, alert. Chest clear to auscultation bilaterally. Clean, intact right chest wall Port-A-Cath. Heart with regular rate and rhythm. Abdomen soft, positive bowel sounds, nontender. Lower extremities- no edema.  Imaging: No results found.  Labs:  CBC:  Recent Labs  04/23/16 0511 05/18/16 0908 07/13/16 0908 07/20/16 1008  WBC 9.8 6.1 3.2* 3.7*  HGB 9.9*  10.5* 12.5* 13.5  HCT 30.3* 32.6* 37.7* 40.4  PLT 336 336 235 255    COAGS:  Recent Labs  12/23/15 1320 02/07/16 0359 04/19/16 1056 07/20/16 1008  INR 1.17 1.28  --  1.00  APTT 31 45* 26  --     BMP:  Recent Labs  04/20/16 0356 04/21/16 0413 04/22/16 0454 04/23/16 0511 05/18/16 0908 07/13/16 0908  NA 137 139 139 138 140 141  K 4.7 4.1 4.0 3.7 4.4 4.1  CL 105 107 104 102  --   --   CO2 25 25 26 29 27 26   GLUCOSE 186* 171* 149* 129* 82 98  BUN 15 14 17 18  11.0 10.0  CALCIUM 9.3 9.3 9.4 9.3 9.5 9.9  CREATININE 0.59* 0.65 0.70 0.66 0.7 0.8  GFRNONAA >60 >60 >60 >60  --   --   GFRAA >60 >60 >60 >60  --   --     LIVER FUNCTION TESTS:  Recent Labs  04/15/16 0824 04/19/16 1056 05/18/16 0908 07/13/16 0908  BILITOT 0.35 0.6 0.43 0.46  AST 22 22 22 23   ALT 26 26 27 20   ALKPHOS 109 89 107 130  PROT 6.6 6.3* 6.3* 6.7  ALBUMIN 3.5 3.9 3.5 4.1    Assessment and Plan: Pt with history of Burkitt's lymphoma , s/p chemotherapy. He is currently in clinical remission and presents today for Port-A-Cath removal. Details/risks of procedure, including but not limited to, internal bleeding infection, injury to adjacent structures discussed with patient with his understanding and consent.   Electronically Signed: D. Rowe Robert 07/20/2016, 11:00 AM   I spent a total of 20 minutes at the the patient's bedside AND on the patient's hospital floor or unit, greater than 50% of which was counseling/coordinating care for Port-A-Cath removal

## 2016-09-19 IMAGING — CR DG CHEST 2V
2 series · 2 of 2 positions shown · non-contrast
Comparison: [DATE]

CLINICAL DATA: Fevers.

EXAM:
CHEST  2 VIEW

[w chest pa]
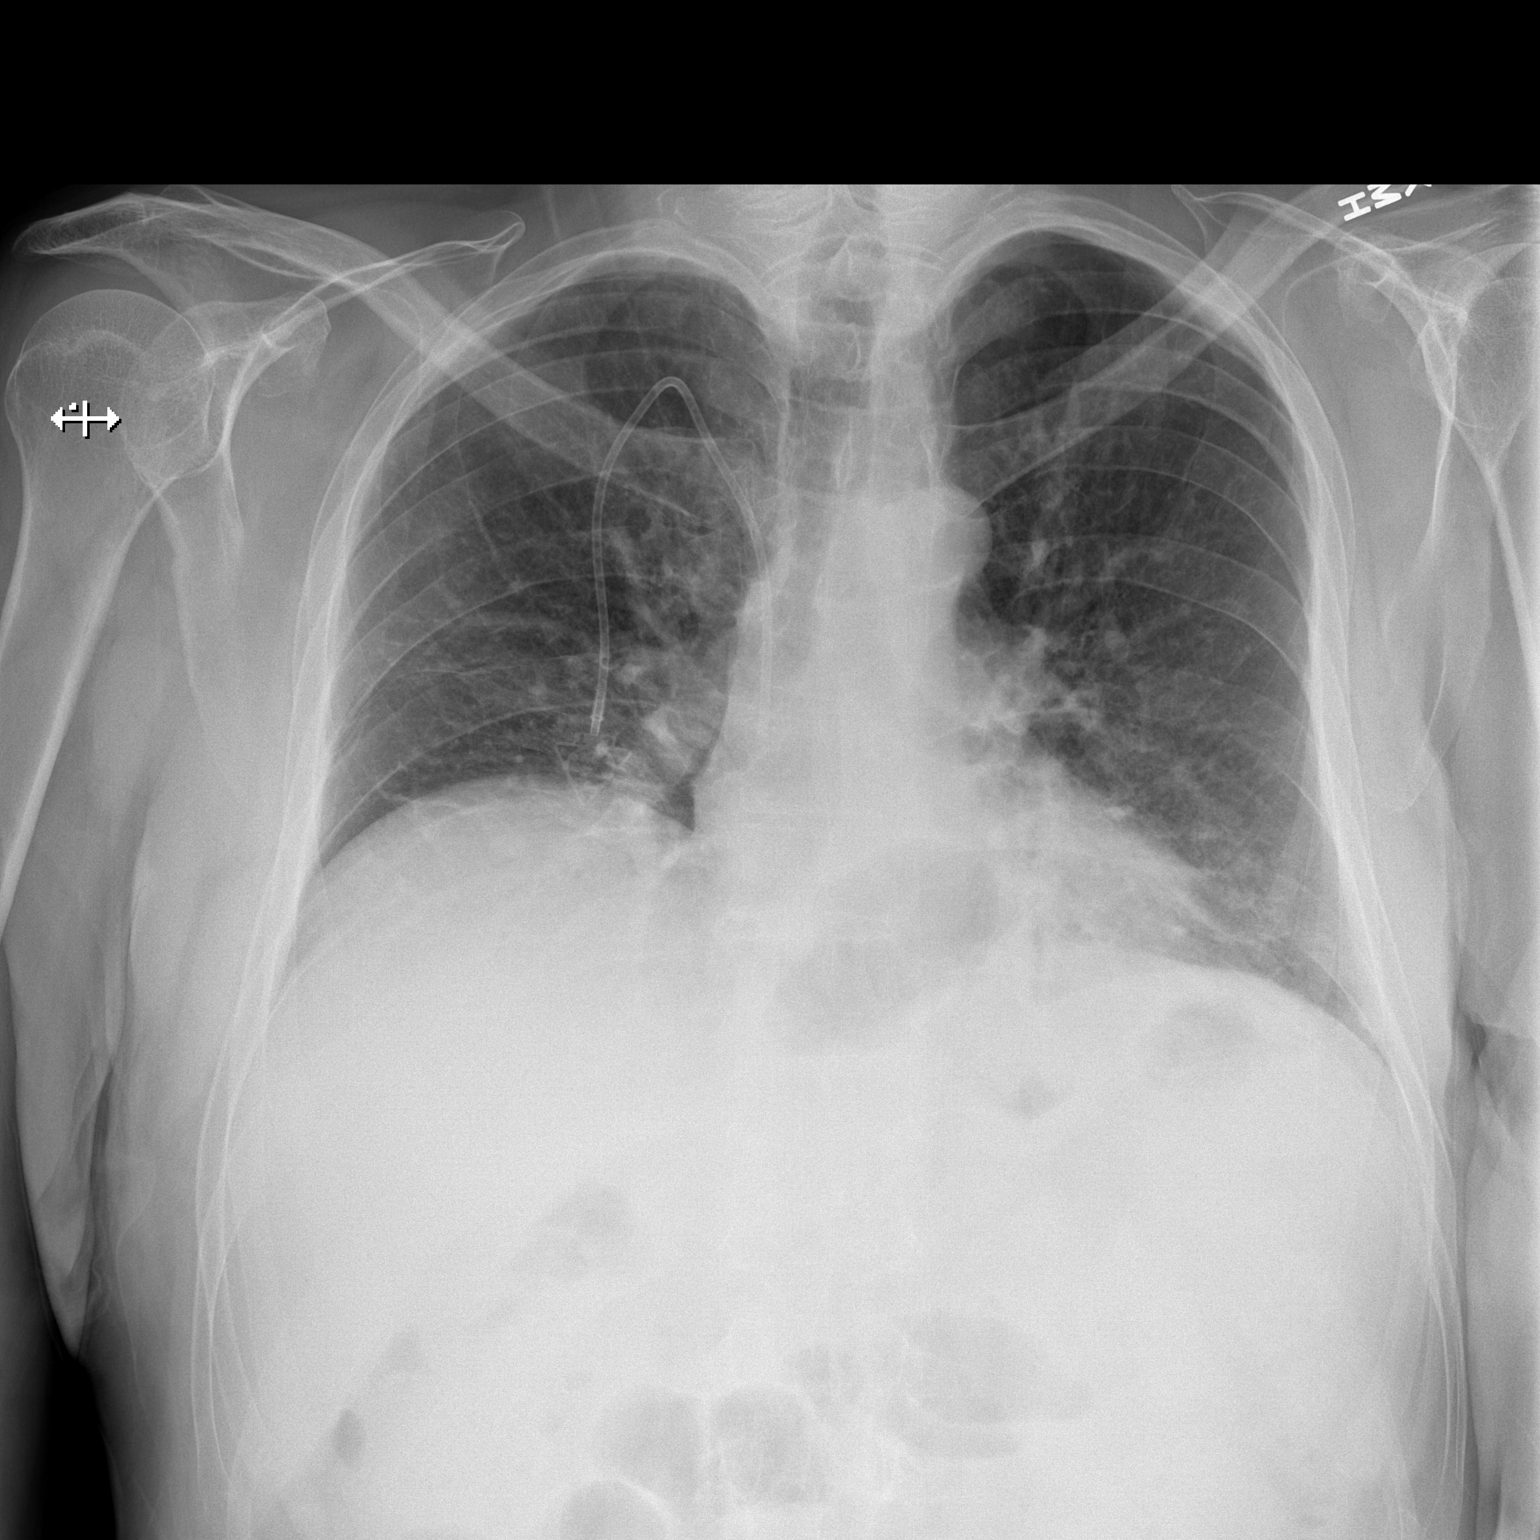

[w chest lat]
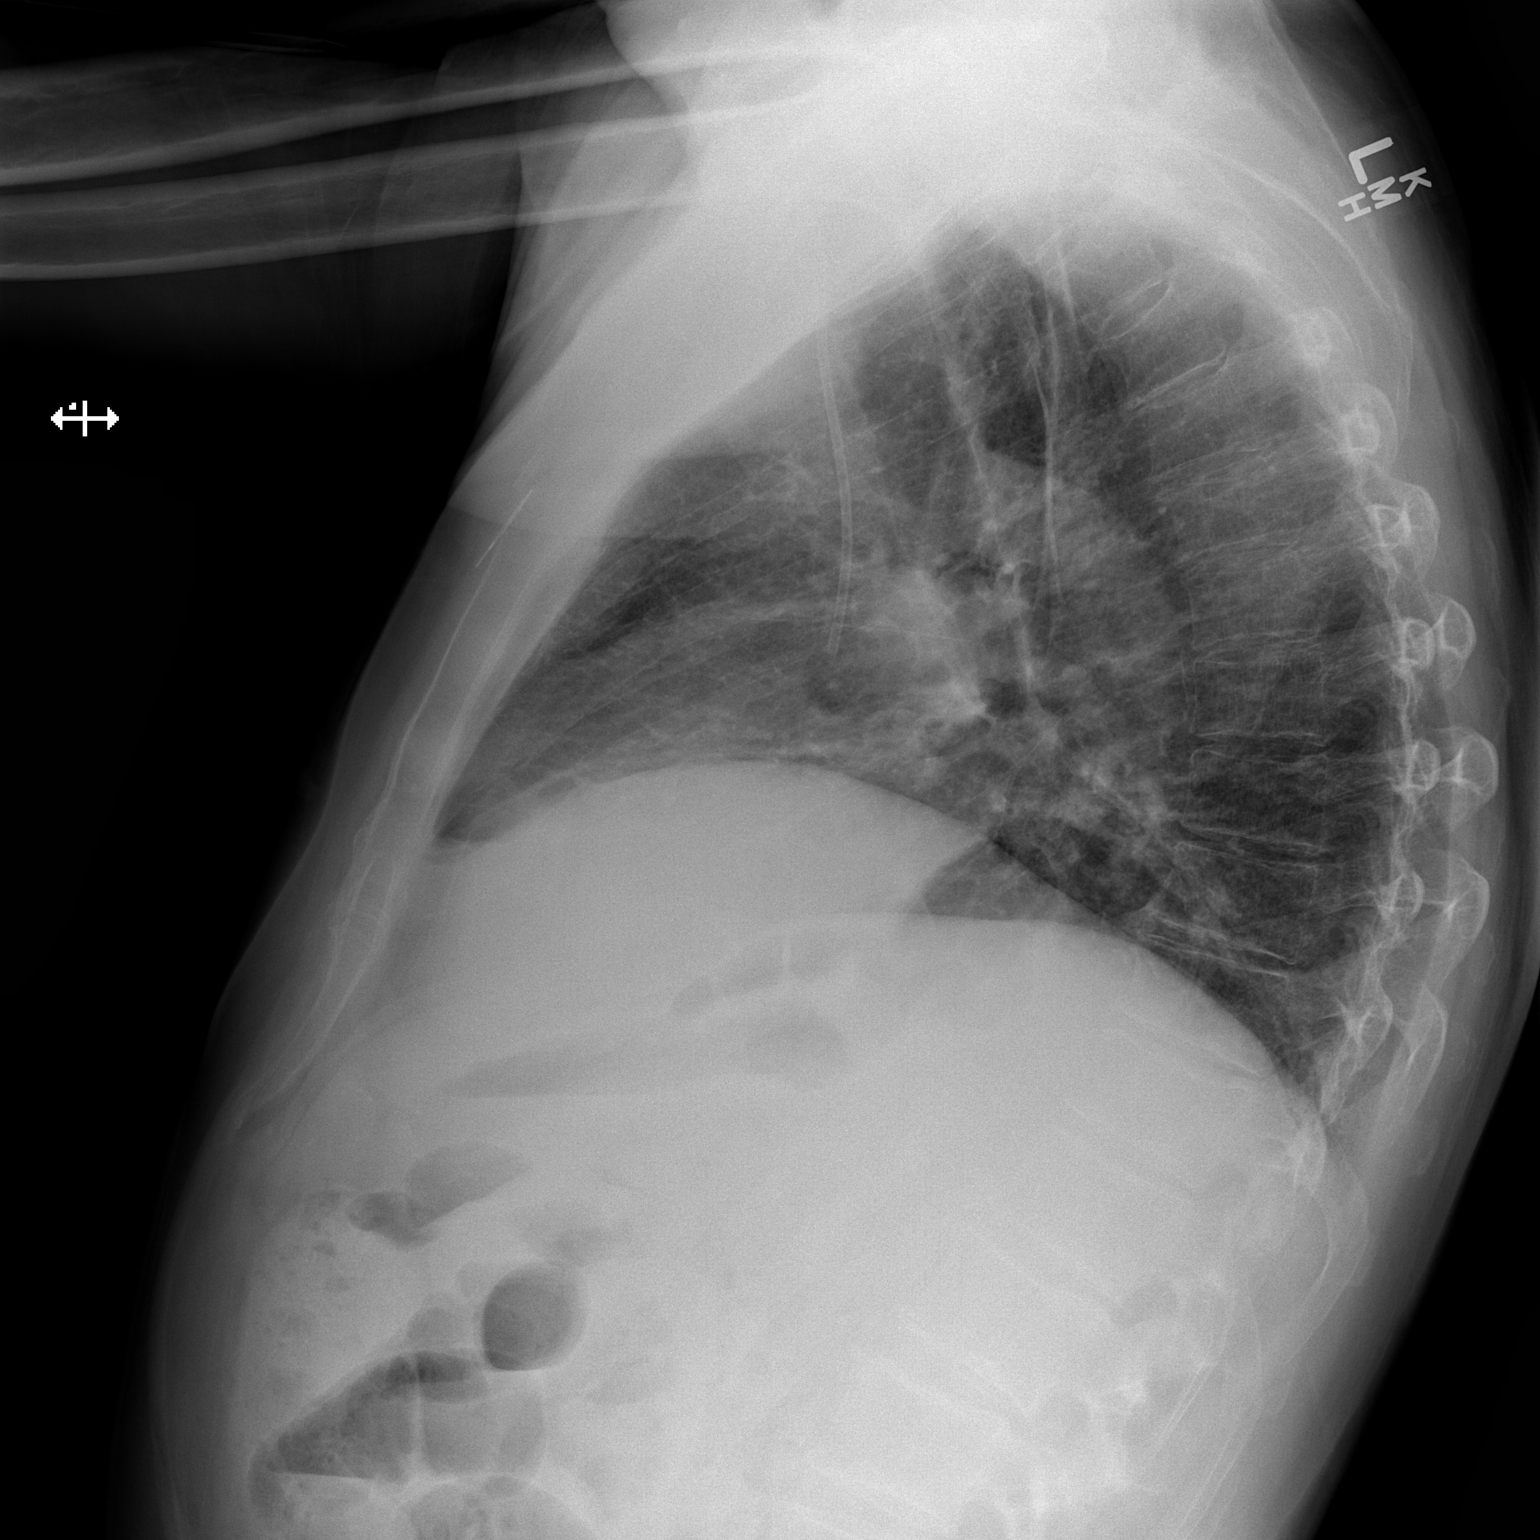

[2 of 2 positions shown; findings below may reference images not displayed]

FINDINGS: There is a right chest wall port a catheter with tip in the
cavoatrial junction. Normal heart size. Lung volumes are low. No
pleural effusion or edema. No airspace consolidation. Asymmetric
elevation of the right hemidiaphragm is again noted.
IMPRESSION: No active cardiopulmonary disease.

## 2016-09-29 IMAGING — RF DG FLUORO GUIDE LUMBAR PUNCTURE
1 series · 1 of 1 positions shown · non-contrast
Comparison: none

CLINICAL DATA: Lumbar puncture with chemotherapy injection,
diagnostic and therapeutic. More lymphoma.

EXAM:
FLUOROSCOPICALLY GUIDED LUMBAR PUNCTURE FOR INTRATHECAL
CHEMOTHERAPY
TECHNIQUE: Informed consent was obtained from the patient prior to the
procedure, including potential complications of headache, allergy,
and pain. A 'time out' was performed. With the patient prone, the
lower back was prepped with Betadine. 1% Lidocaine was used for
local anesthesia. Lumbar puncture was performed at the L2-3 using a
20 gauge

[Series 1: run · 1 of 1 slices shown]
[im 1/1]
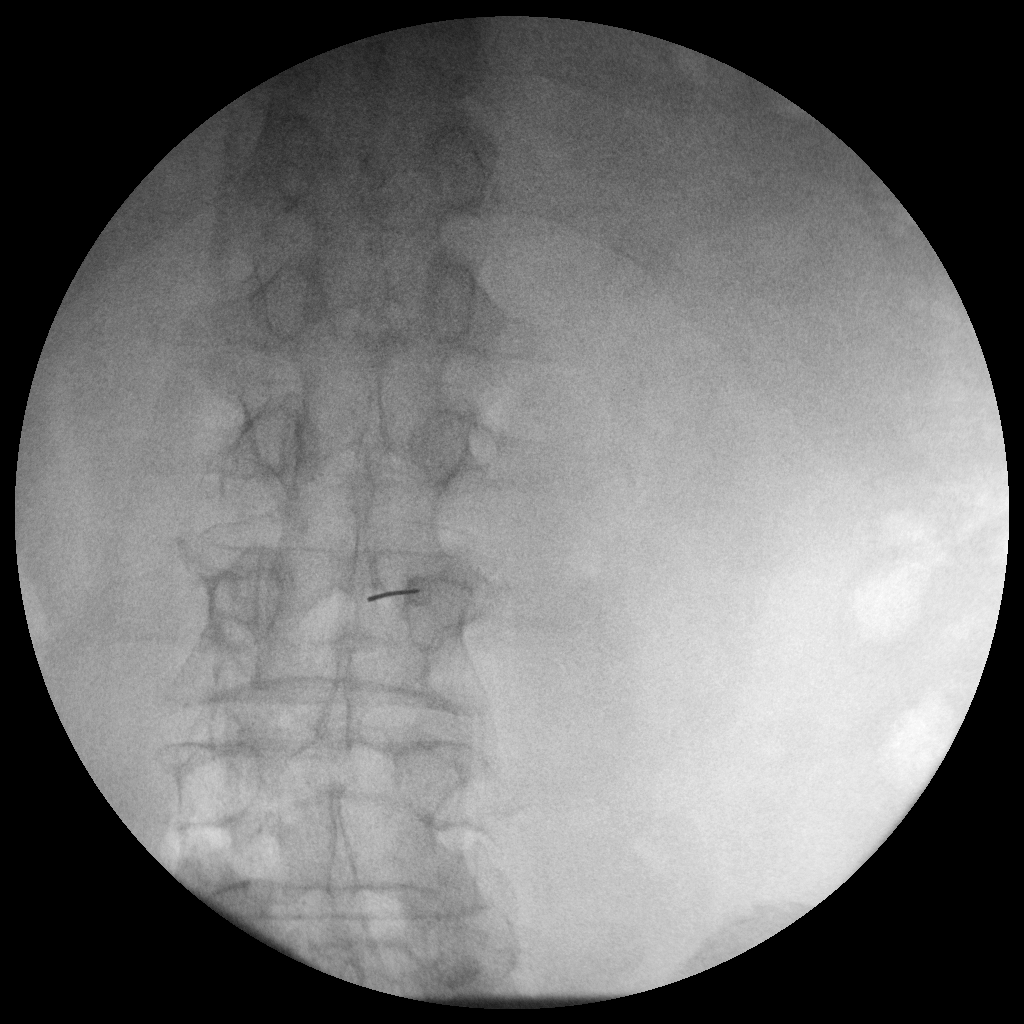

[1 of 1 positions shown; findings below may reference images not displayed]

needle with return of clear CSF. Opening pressure 14 cm of water. 10
cc of clear CSF was collected. 12 mg methotrexate with 50 mg
hydrocortisone in a 10 mL solutionwas injected into the subarachnoid
space. The patient tolerated the procedure well without apparent
complication.

FLUOROSCOPY TIME:  20 seconds
IMPRESSION: Intrathecal injection of chemotherapy without complication

## 2016-10-12 ENCOUNTER — Telehealth: Payer: Self-pay | Admitting: Hematology

## 2016-10-12 ENCOUNTER — Ambulatory Visit (HOSPITAL_BASED_OUTPATIENT_CLINIC_OR_DEPARTMENT_OTHER): Payer: PRIVATE HEALTH INSURANCE | Admitting: Hematology

## 2016-10-12 ENCOUNTER — Other Ambulatory Visit (HOSPITAL_BASED_OUTPATIENT_CLINIC_OR_DEPARTMENT_OTHER): Payer: PRIVATE HEALTH INSURANCE

## 2016-10-12 ENCOUNTER — Encounter: Payer: Self-pay | Admitting: Hematology

## 2016-10-12 VITALS — BP 141/68 | HR 72 | Temp 97.8°F | Resp 18 | Ht 72.0 in | Wt 195.0 lb

## 2016-10-12 DIAGNOSIS — Z8572 Personal history of non-Hodgkin lymphomas: Secondary | ICD-10-CM | POA: Diagnosis not present

## 2016-10-12 DIAGNOSIS — T451X5A Adverse effect of antineoplastic and immunosuppressive drugs, initial encounter: Secondary | ICD-10-CM

## 2016-10-12 DIAGNOSIS — D649 Anemia, unspecified: Secondary | ICD-10-CM | POA: Diagnosis not present

## 2016-10-12 DIAGNOSIS — I10 Essential (primary) hypertension: Secondary | ICD-10-CM | POA: Diagnosis not present

## 2016-10-12 DIAGNOSIS — C8378 Burkitt lymphoma, lymph nodes of multiple sites: Secondary | ICD-10-CM

## 2016-10-12 DIAGNOSIS — R740 Nonspecific elevation of levels of transaminase and lactic acid dehydrogenase [LDH]: Secondary | ICD-10-CM

## 2016-10-12 DIAGNOSIS — G620 Drug-induced polyneuropathy: Secondary | ICD-10-CM | POA: Diagnosis not present

## 2016-10-12 DIAGNOSIS — R7402 Elevation of levels of lactic acid dehydrogenase (LDH): Secondary | ICD-10-CM

## 2016-10-12 LAB — COMPREHENSIVE METABOLIC PANEL
ALBUMIN: 4.2 g/dL (ref 3.5–5.0)
ALT: 24 U/L (ref 0–55)
ANION GAP: 11 meq/L (ref 3–11)
AST: 25 U/L (ref 5–34)
Alkaline Phosphatase: 111 U/L (ref 40–150)
BUN: 13.1 mg/dL (ref 7.0–26.0)
CHLORIDE: 105 meq/L (ref 98–109)
CO2: 25 meq/L (ref 22–29)
Calcium: 9.7 mg/dL (ref 8.4–10.4)
Creatinine: 0.7 mg/dL (ref 0.7–1.3)
Glucose: 100 mg/dl (ref 70–140)
Potassium: 4.1 mEq/L (ref 3.5–5.1)
SODIUM: 141 meq/L (ref 136–145)
TOTAL PROTEIN: 6.7 g/dL (ref 6.4–8.3)
Total Bilirubin: 0.72 mg/dL (ref 0.20–1.20)

## 2016-10-12 LAB — CBC & DIFF AND RETIC
BASO%: 0.5 % (ref 0.0–2.0)
Basophils Absolute: 0 10*3/uL (ref 0.0–0.1)
EOS ABS: 0.2 10*3/uL (ref 0.0–0.5)
EOS%: 5.2 % (ref 0.0–7.0)
HCT: 37.6 % — ABNORMAL LOW (ref 38.4–49.9)
HEMOGLOBIN: 12.2 g/dL — AB (ref 13.0–17.1)
IMMATURE RETIC FRACT: 12 % — AB (ref 3.00–10.60)
LYMPH#: 1.2 10*3/uL (ref 0.9–3.3)
LYMPH%: 31 % (ref 14.0–49.0)
MCH: 26.8 pg — ABNORMAL LOW (ref 27.2–33.4)
MCHC: 32.4 g/dL (ref 32.0–36.0)
MCV: 82.5 fL (ref 79.3–98.0)
MONO#: 0.7 10*3/uL (ref 0.1–0.9)
MONO%: 17.3 % — ABNORMAL HIGH (ref 0.0–14.0)
NEUT#: 1.8 10*3/uL (ref 1.5–6.5)
NEUT%: 46 % (ref 39.0–75.0)
PLATELETS: 237 10*3/uL (ref 140–400)
RBC: 4.56 10*6/uL (ref 4.20–5.82)
RDW: 15.6 % — ABNORMAL HIGH (ref 11.0–14.6)
RETIC CT ABS: 66.12 10*3/uL (ref 34.80–93.90)
Retic %: 1.45 % (ref 0.80–1.80)
WBC: 3.8 10*3/uL — ABNORMAL LOW (ref 4.0–10.3)

## 2016-10-12 LAB — LACTATE DEHYDROGENASE: LDH: 317 U/L — ABNORMAL HIGH (ref 125–245)

## 2016-10-12 NOTE — Telephone Encounter (Signed)
Gave patient AVS and calender per 5/15 los. - [facility] to contact patient with PET schedule.

## 2016-10-12 NOTE — Progress Notes (Signed)
Erik Vaughn    HEMATOLOGY/ONCOLOGY CLINIC NOTE  Date of Service: 10/12/2016  Patient Care Team: Ernestene Kiel, MD as PCP - General (Internal Medicine) [name], MD as Consulting Physician (Orthopedic Surgery)  CHIEF COMPLAINTS/PURPOSE OF CONSULTATION:  F/u for Burkitt's lymphoma  DIAGNOSIS BUrkitts Lymphoma currently in remission  Stage IVB Burkitt's lymphoma with t(8;14) translocation diagnosed 12/23/2015. This appears to be involving the sacrum causing sacral and root involvement with possible neurogenic bladder. PET CT scan as noted above shows extensive involvement with lymphoma. CSF negative for involvement. Bone marrow biopsy negative for involvement by lymphoma.  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 PET CT scan as noted above shows extensive involvement with lymphoma. CSF negative for involvement. Bone marrow biopsy negative for involvement by lymphoma. PET/CT scan after 3 cycles of EPOCH-R  on 03/02/2016  showed significant interval metabolic response.  Patient has completed cycle 6 of EPOCH-R and tolerated it well with no prohibitive toxicities. Grade 1 fatigue.  He has had some episodes of fevers including a periodontal abscess,  non-neutropenic fevers - unclear etiology, recent Escherichia coli urinary tract infection x 2 and Clostridium difficile diarrhea.  PET CT scan done after completion of this planned chemotherapy shows no clear evidence of residual lymphoma . Some borderline FDG avidity in the left sacral ala ?pathologic fracture vs residual lymphoma  Patient was evaluated by radiation oncology - no RT recommended to sacral lesion.  CURRENT TREATMENT -active surveillance  Previous treatment The patient has completed 6 cycles of EPOCH -R and IT methotrexate x 4 doses starting with cycle 3 for CNS prophylaxis   HISTORY OF  PRESENTING ILLNESS:  Please see my previous note for details  INTERVAL HISTORY  Erik Vaughn is here for a follow-up with his wife for his scheduled 3 month f/u for his Burkitts lymphoma. He notes not acute new symptoms. No fevers/chills/nightsweat/weight loss. He is eating well and gained 2 lbs additional since last clinic visit and is up to 195lbs.. Has not noted any enlarged LN. No abd pain. No new back pain. He notes that his neuropathic symptoms in his fingers has resolved. His neuropathic symptoms in his right foot have nearly resolved but he does have some persistent left foot symptoms with some discomfort after walking. He also notes some intermittent left lower extremity cramping after activities. He has known peripheral arterial disease and was recommended to Dr. his primary care physician and cardiologist about ruling out PAD as an etiology of his left foot discomfort and cramps. He has been taking his Cymbalta regularly at this time at 30 mg by mouth daily.  His LDH level is somewhat elevated and so it discussed and decided to pursue you with the repeat PET scan to rule out any possible concerns for recurrence of lymphoma.  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   He smoked about 2 packs a day for 40 years.  SURGICAL HISTORY:  #1 left submandibular salivary gland benign tumor excision #2 port placement - 12/31/2015  SOCIAL HISTORY: Social History   Social History  . Marital status: Married    Spouse name: N/A  . Number of children: N/A  . Years of education: N/A   Occupational History  . meter specialist    Social History Main  Topics  . Smoking status: Former Smoker    Packs/day: 2.00    Years: 16.00    Quit date: 12/22/2005  . Smokeless tobacco: Current User    Types: Chew  .  Alcohol use No  . Drug use: No  . Sexual activity: No   Other Topics Concern  . Not on file   Social History Narrative  . No narrative on file  smoked 2 packs of cigarettes per day for 40 years quit about 8-9 years ago. Denies significant alcohol use. 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  FAMILY HISTORY:  Sister had breast cancer at age 38 years. Unknown if she had genetic testing One maternal uncle had melanoma Second maternal uncle had pancreatic cancer under the age of 49yrPaternal uncle with prostate cancer Dad had prostate cancer at age 7151years   ALLERGIES:  has No Known Allergies.  MEDICATIONS:  Current Outpatient Prescriptions  Medication Sig Dispense Refill  . amLODipine (NORVASC) 10 MG tablet Take 1 tablet (10 mg total) by mouth every evening. 30 tablet 3  . b complex vitamins tablet Take 1 tablet by mouth daily.    . Cholecalciferol (VITAMIN D3) 2000 units TABS Take 2,000 Units by mouth daily with lunch.     . clopidogrel (PLAVIX) 75 MG tablet Take 1 tablet (75 mg total) by mouth every evening.    .Erik Vaughn Kitchenlisinopril (PRINIVIL,ZESTRIL) 20 MG tablet Take 20 mg by mouth every evening.     . Omega-3 Fatty Acids (FISH OIL) 875 MG CAPS Take by mouth.    . rosuvastatin (CRESTOR) 5 MG tablet Take 5 mg by mouth daily.    . vitamin C (ASCORBIC ACID) 500 MG tablet Take 500 mg by mouth 2 (two) times daily.     No current facility-administered medications for this visit.     REVIEW OF SYSTEMS:    10 Point review of Systems was done is negative except as noted above.  PHYSICAL EXAMINATION: ECOG PERFORMANCE STATUS: 1 - Symptomatic but completely ambulatory  . Vitals:   10/12/16 0949  BP: (!) 141/68  Pulse: 72  Resp: 18  Temp: 97.8 F (36.6 C)   Filed Weights   10/12/16 0949  Weight: 195 lb (88.5  kg)   .Body mass index is 26.45 kg/m.  GENERAL:alert, in no acute distress and comfortable SKIN: skin color, texture, turgor are normal, no rashes or significant lesions EYES: normal, conjunctiva are pink and non-injected, sclera clear OROPHARYNX:no exudate, no erythema and lips, buccal mucosa, and tongue normal  NECK: supple, no JVD, thyroid normal size, non-tender, without nodularity LYMPH:  no palpable lymphadenopathy in the cervical, axillary or inguinal LUNGS: clear to auscultation with normal respiratory effort, large left anterior chest wall mass noted appears nontender and fixed. HEART: regular rate & rhythm,  no murmurs and no lower extremity edema ABDOMEN: abdomen soft, non-tender, normoactive bowel sounds , no palpable hepatosplenomegaly. Musculoskeletal: no cyanosis of digits and no clubbing  PSYCH: alert & oriented x 3 with fluent speech NEURO: no focal motor/sensory deficits  LABORATORY DATA:  I have reviewed the data as listed . CBC    Component Value Date/Time   WBC 3.8 (L) 10/12/2016 0900   WBC 3.7 (L) 07/20/2016 1008   RBC 4.56 10/12/2016 0900   RBC 5.03 07/20/2016 1008   HGB  12.2 (L) 10/12/2016 0900   HCT 37.6 (L) 10/12/2016 0900   PLT 237 10/12/2016 0900   MCV 82.5 10/12/2016 0900   MCH 26.8 (L) 10/12/2016 0900   MCH 26.8 07/20/2016 1008   MCHC 32.4 10/12/2016 0900   MCHC 33.4 07/20/2016 1008   RDW 15.6 (H) 10/12/2016 0900   LYMPHSABS 1.2 10/12/2016 0900   MONOABS 0.7 10/12/2016 0900   EOSABS 0.2 10/12/2016 0900   BASOSABS 0.0 10/12/2016 0900   . CMP Latest Ref Rng & Units 10/12/2016 07/13/2016 05/18/2016  Glucose 70 - 140 mg/dl 100 98 82  BUN 7.0 - 26.0 mg/dL 13.1 10.0 11.0  Creatinine 0.7 - 1.3 mg/dL 0.7 0.8 0.7  Sodium 136 - 145 mEq/L 141 141 140  Potassium 3.5 - 5.1 mEq/L 4.1 4.1 4.4  Chloride 101 - 111 mmol/L - - -  CO2 22 - 29 mEq/L _0 Calcium 8.4 - 10.4 mg/dL 9.7 9.9 9.5  Total Protein 6.4 - 8.3 g/dL 6.7 6.7 6.3(L)  Total Bilirubin  0.20 - 1.20 mg/dL 0.72 0.46 0.43  Alkaline Phos 40 - 150 U/L 111 130 107  AST 5 - 34 U/L _1 ALT 0 - 55 U/L _2 . Lab Results  Component Value Date   LDH 317 (H) 10/12/2016     RADIOGRAPHIC STUDIES: I have personally reviewed the radiological images as listed and agreed with the findings in the report.  ASSESSMENT & PLAN:   66 year old Caucasian male with  #1 Stage IVB Burkitt's lymphoma with t(8;14) translocation Patient has completed cycle 6 of EPOCH-R and CNS prophylaxis with IT MTX - tolerated it well with no prohibitive toxicities.  Patient was evaluated by radiation oncology and no ISRT was recommended. PLAN -patient has no clinical evidence of lymphoma recurrence /progression at this time.  -His LDH level appears to be increasing in the discussed and decided to proceed with a repeat PET/CT scan to rule out any possibility of lymphoma recurrence at this time. -PET/CT scan in 2 weeks. -We shall see him back in 3 weeks with PET/CT results and repeat CBC and LDH.  #2 normocytic anemia likely related to chemotherapy.  No evidence of bleeding.  -improved and nearly resolved off chemotherapy.  #3 left sacral ala and S1 fracture. L5-S1 and S1-S2 left-sided nerve root compression. #4 history of peripheral arterial disease\ #5 chemotherapy related neuropathy - nearly resolved. Plan -Patient's chemotherapy related neuropathy has nearly completely resolved but he does have some persistent symptoms in his left forefoot. -It is unusual for chemotherapy reluctant neuropathy to cause persistent unilateral symptoms. Patient was recommended to follow-up with his primary care physician to rule out peripheral arterial disease and a radiculopathy as other possible etiologies of his persistent left foot symptoms. -He continues to be on Cymbalta 30 mg by mouth daily .  #4 Hypertension, dyslipidemia, peripheral arterial disease -Continue management as per primary care  physician.  #5 ex-smoker with 80-pack-year history of smoking.  -PET/CT in 2 weeks -RTC with Dr Irene Limbo in 3 weeks with labs and PET/CT results  I spent 20 minutes counseling the patient face to face. The total time spent in the appointment was 25 minutes and more than 50% was on counseling and direct patient cares and co-ordinating inpatient hospitalization logistics    Sullivan Lone MD [place] AAHIVMS [facility] Hematology/Oncology Physician [place]  (Office):       [phone] (Work cell):  [phone] (Fax):  [phone]

## 2016-10-12 NOTE — Patient Instructions (Signed)
Thank you for choosing [facility] to provide your oncology and hematology care.  To afford each patient quality time with our providers, please arrive 30 minutes before your scheduled appointment time.  If you arrive late for your appointment, you may be asked to reschedule.  We strive to give you quality time with our providers, and arriving late affects you and other patients whose appointments are after yours.   If you are a no show for multiple scheduled visits, you may be dismissed from the clinic at the providers discretion.    Again, thank you for choosing [facility], our hope is that these requests will decrease the amount of time that you wait before being seen by our physicians.  ______________________________________________________________________  Should you have questions after your visit to the [facility], please contact our office at [phone] between the hours of 8:30 and 4:30 p.m.    Voicemails left after 4:30p.m will not be returned until the following business day.    For prescription refill requests, please have your pharmacy contact us directly.  Please also try to allow 48 hours for prescription requests.    Please contact the scheduling department for questions regarding scheduling.  For scheduling of procedures such as PET scans, CT scans, MRI, Ultrasound, etc please contact central scheduling at [phone].    Resources For Cancer Patients and Caregivers:   Oncolink.org:  A wonderful resource for patients and healthcare providers for information regarding your disease, ways to tract your treatment, what to expect, etc.     American Cancer Society:  800-227-2345  Can help patients locate various types of support and financial assistance  Cancer Care: 1-800-813-HOPE (4673) Provides financial assistance, online support groups, medication/co-pay assistance.    [place] DSS:  336-641-3447 Where to apply for food  stamps, Medicaid, and utility assistance  Medicare Rights Center: 800-333-4114 Helps people with Medicare understand their rights and benefits, navigate the Medicare system, and secure the quality healthcare they deserve  SCAT: 336-333-6589 [place] [employer]'s shared-ride transportation service for eligible riders who have a disability that prevents them from riding the fixed route bus.    For additional information on assistance programs please contact our social worker:   Grier Hock/Abigail Elmore:  [phone]            

## 2016-10-14 ENCOUNTER — Other Ambulatory Visit: Payer: Self-pay | Admitting: *Deleted

## 2016-10-14 MED ORDER — DULOXETINE HCL 60 MG PO CPEP: 60.0000 mg | ORAL_CAPSULE | Freq: Every day | ORAL | 0 refills | Status: DC

## 2016-10-20 IMAGING — RF DG FLUORO GUIDE SPINAL/SI JT INJ*R*
1 series · 1 of 1 positions shown · non-contrast
Comparison: none

CLINICAL DATA: 65-year-old male presents for his third intrathecal
chemotherapy injection. Burkitt lymphoma. Subsequent encounter.

EXAM:
FLUOROSCOPICALLY GUIDED LUMBAR PUNCTURE FOR INTRATHECAL
CHEMOTHERAPY
TECHNIQUE: Informed consent was obtained from the patient prior to the
procedure, including potential complications of headache, allergy,
and pain. A 'time out' was performed. With the patient prone, the
lower back was prepped with Betadine. 1% Lidocaine was used for
local anesthesia.

[Series 1: run · 1 of 1 slices shown]
[im 1/1]
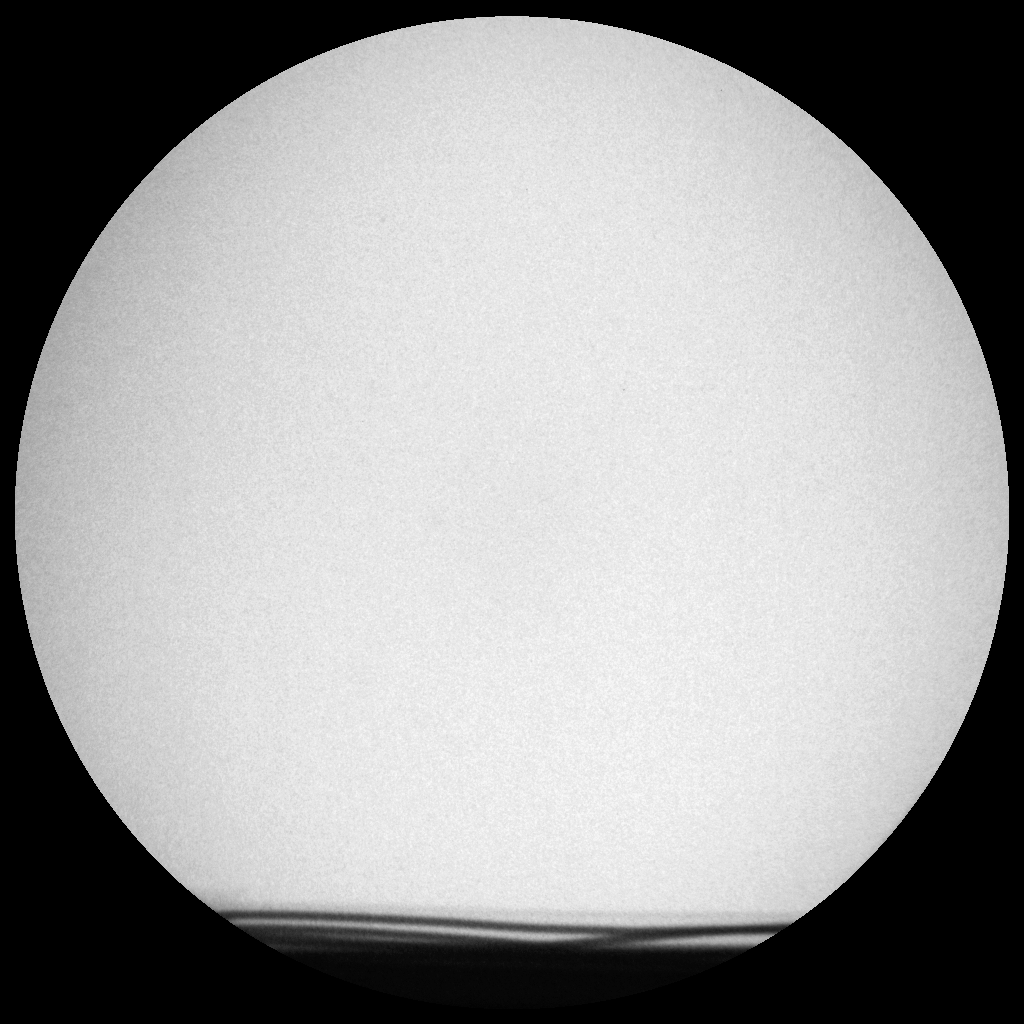

[1 of 1 positions shown; findings below may reference images not displayed]

Lumbar puncture was performed at the L2-L3 level using left sub
laminar technique, and a 3.5 inch x 20 gauge needle with return of
clear CSF.

The chemotherapy mixture sent from pharmacy labeled with the
patient's name and date of birth was slowly injected into the
subarachnoid space. The patient tolerated the procedure well without
apparent complication. Appropriate postprocedural orders were placed
in [REDACTED] and he was returned to the inpatient [facility] for further
treatment.

FLUOROSCOPY TIME:  0 minutes 12 seconds
IMPRESSION: Intrathecal injection of chemotherapy at L2-L3 without complication.

## 2016-10-28 ENCOUNTER — Encounter (HOSPITAL_COMMUNITY)
Admission: RE | Admit: 2016-10-28 | Discharge: 2016-10-28 | Disposition: A | Discharge status: Home / Self Care | Payer: PRIVATE HEALTH INSURANCE | Source: Ambulatory Visit | Attending: Hematology | Admitting: Hematology

## 2016-10-28 DIAGNOSIS — R740 Nonspecific elevation of levels of transaminase and lactic acid dehydrogenase [LDH]: Secondary | ICD-10-CM | POA: Insufficient documentation

## 2016-10-28 DIAGNOSIS — C8378 Burkitt lymphoma, lymph nodes of multiple sites: Secondary | ICD-10-CM | POA: Diagnosis present

## 2016-10-28 DIAGNOSIS — R7402 Elevation of levels of lactic acid dehydrogenase (LDH): Secondary | ICD-10-CM

## 2016-10-28 LAB — GLUCOSE, CAPILLARY: Glucose-Capillary: 114 mg/dL — ABNORMAL HIGH (ref 65–99)

## 2016-10-28 MED ORDER — FLUDEOXYGLUCOSE F - 18 (FDG) INJECTION: 9.5000 | Freq: Once | INTRAVENOUS | Status: DC | PRN

## 2016-11-02 ENCOUNTER — Ambulatory Visit (HOSPITAL_BASED_OUTPATIENT_CLINIC_OR_DEPARTMENT_OTHER): Payer: PRIVATE HEALTH INSURANCE

## 2016-11-02 ENCOUNTER — Other Ambulatory Visit: Payer: PRIVATE HEALTH INSURANCE

## 2016-11-02 ENCOUNTER — Encounter: Payer: Self-pay | Admitting: Hematology

## 2016-11-02 ENCOUNTER — Other Ambulatory Visit (HOSPITAL_COMMUNITY)
Admission: RE | Admit: 2016-11-02 | Discharge: 2016-11-02 | Disposition: A | Discharge status: Home / Self Care | Payer: PRIVATE HEALTH INSURANCE | Source: Ambulatory Visit | Attending: Hematology | Admitting: Hematology

## 2016-11-02 ENCOUNTER — Ambulatory Visit (HOSPITAL_BASED_OUTPATIENT_CLINIC_OR_DEPARTMENT_OTHER): Payer: PRIVATE HEALTH INSURANCE | Admitting: Hematology

## 2016-11-02 ENCOUNTER — Telehealth: Payer: Self-pay | Admitting: Hematology

## 2016-11-02 VITALS — BP 132/68 | HR 75 | Temp 97.9°F | Resp 20 | Ht 72.0 in | Wt 193.3 lb

## 2016-11-02 DIAGNOSIS — D709 Neutropenia, unspecified: Secondary | ICD-10-CM

## 2016-11-02 DIAGNOSIS — R7402 Elevation of levels of lactic acid dehydrogenase (LDH): Secondary | ICD-10-CM

## 2016-11-02 DIAGNOSIS — Z8572 Personal history of non-Hodgkin lymphomas: Secondary | ICD-10-CM

## 2016-11-02 DIAGNOSIS — I10 Essential (primary) hypertension: Secondary | ICD-10-CM | POA: Diagnosis not present

## 2016-11-02 DIAGNOSIS — D649 Anemia, unspecified: Secondary | ICD-10-CM | POA: Diagnosis not present

## 2016-11-02 DIAGNOSIS — R740 Nonspecific elevation of levels of transaminase and lactic acid dehydrogenase [LDH]: Secondary | ICD-10-CM

## 2016-11-02 DIAGNOSIS — C8378 Burkitt lymphoma, lymph nodes of multiple sites: Secondary | ICD-10-CM

## 2016-11-02 DIAGNOSIS — R197 Diarrhea, unspecified: Secondary | ICD-10-CM

## 2016-11-02 LAB — CBC & DIFF AND RETIC
BASO%: 1.4 % (ref 0.0–2.0)
BASO%: 2.8 % — AB (ref 0.0–2.0)
Basophils Absolute: 0 10*3/uL (ref 0.0–0.1)
Basophils Absolute: 0.1 10*3/uL (ref 0.0–0.1)
EOS ABS: 0.2 10*3/uL (ref 0.0–0.5)
EOS ABS: 0.2 10*3/uL (ref 0.0–0.5)
EOS%: 8.2 % — ABNORMAL HIGH (ref 0.0–7.0)
EOS%: 8.5 % — AB (ref 0.0–7.0)
HCT: 36.8 % — ABNORMAL LOW (ref 38.4–49.9)
HCT: 38 % — ABNORMAL LOW (ref 38.4–49.9)
HEMOGLOBIN: 12.1 g/dL — AB (ref 13.0–17.1)
HGB: 12.6 g/dL — ABNORMAL LOW (ref 13.0–17.1)
IMMATURE RETIC FRACT: 5.9 % (ref 3.00–10.60)
Immature Retic Fract: 6.9 % (ref 3.00–10.60)
LYMPH#: 1.2 10*3/uL (ref 0.9–3.3)
LYMPH%: 55.3 % — ABNORMAL HIGH (ref 14.0–49.0)
LYMPH%: 61 % — ABNORMAL HIGH (ref 14.0–49.0)
MCH: 26.9 pg — ABNORMAL LOW (ref 27.2–33.4)
MCH: 27 pg — AB (ref 27.2–33.4)
MCHC: 32.9 g/dL (ref 32.0–36.0)
MCHC: 33.2 g/dL (ref 32.0–36.0)
MCV: 81.8 fL (ref 79.3–98.0)
MCV: 81.4 fL (ref 79.3–98.0)
MONO#: 0.6 10*3/uL (ref 0.1–0.9)
MONO#: 0.4 10*3/uL (ref 0.1–0.9)
MONO%: 30.8 % — ABNORMAL HIGH (ref 0.0–14.0)
MONO%: 23.2 % — AB (ref 0.0–14.0)
NEUT%: 4.3 % — ABNORMAL LOW (ref 39.0–75.0)
NEUT%: 4.5 % — ABNORMAL LOW (ref 39.0–75.0)
NEUTROS ABS: 0.1 10*3/uL — AB (ref 1.5–6.5)
NEUTROS ABS: 0.1 10*3/uL — AB (ref 1.5–6.5)
NRBC: 0 % (ref 0–0)
PLATELETS: 264 10*3/uL (ref 140–400)
Platelets: 255 10*3/uL (ref 140–400)
RBC: 4.5 10*6/uL (ref 4.20–5.82)
RBC: 4.67 10*6/uL (ref 4.20–5.82)
RDW: 15.2 % — AB (ref 11.0–14.6)
RDW: 15.2 % — AB (ref 11.0–14.6)
RETIC %: 1.47 % (ref 0.80–1.80)
Retic %: 1.67 % (ref 0.80–1.80)
Retic Ct Abs: 66.15 10*3/uL (ref 34.80–93.90)
Retic Ct Abs: 77.99 10*3/uL (ref 34.80–93.90)
WBC: 2.1 10*3/uL — AB (ref 4.0–10.3)
WBC: 1.8 10*3/uL — AB (ref 4.0–10.3)
lymph#: 1.1 10*3/uL (ref 0.9–3.3)

## 2016-11-02 LAB — COMPREHENSIVE METABOLIC PANEL
ALT: 29 U/L (ref 0–55)
ANION GAP: 10 meq/L (ref 3–11)
AST: 25 U/L (ref 5–34)
Albumin: 4.5 g/dL (ref 3.5–5.0)
Alkaline Phosphatase: 119 U/L (ref 40–150)
BILIRUBIN TOTAL: 0.82 mg/dL (ref 0.20–1.20)
BUN: 14.8 mg/dL (ref 7.0–26.0)
CO2: 28 meq/L (ref 22–29)
CREATININE: 0.8 mg/dL (ref 0.7–1.3)
Calcium: 10 mg/dL (ref 8.4–10.4)
Chloride: 102 mEq/L (ref 98–109)
EGFR: >90 mL/min/{1.73_m2} (ref >90)
GLUCOSE: 108 mg/dL (ref 70–140)
Potassium: 4.4 mEq/L (ref 3.5–5.1)
Sodium: 140 mEq/L (ref 136–145)
TOTAL PROTEIN: 7.1 g/dL (ref 6.4–8.3)

## 2016-11-02 LAB — CHCC SMEAR

## 2016-11-02 LAB — LACTATE DEHYDROGENASE
LDH: 160 U/L (ref 125–245)
LDH: 173 U/L (ref 125–245)

## 2016-11-02 MED ORDER — AMLODIPINE BESYLATE 10 MG PO TABS: 10.0000 mg | ORAL_TABLET | Freq: Every evening | ORAL | 0 refills | Status: DC

## 2016-11-02 NOTE — Telephone Encounter (Signed)
Labs added for today, per 11/02/16 los. Lab and follow up with Dr Irene Limbo scheduled for 2 weeks, per 11/02/16 los. Patient was given a copy of the AVS report and appointment schedule per 11/02/16 los.

## 2016-11-03 LAB — FOLATE RBC
Folate, RBC: >1649 ng/mL (ref >498)
Hematocrit: 37.6 % (ref 37.5–51.0)

## 2016-11-03 LAB — CMV ANTIBODY, IGG (EIA): CMV AB - IGG: 3.6 U/mL — AB (ref 0.00–0.59)

## 2016-11-03 LAB — COPPER, SERUM: Copper: 91 ug/dL (ref 72–166)

## 2016-11-03 LAB — CMV IGM: CMV IgM Ser EIA-aCnc: <30 AU/mL (ref 0.0–29.9)

## 2016-11-03 LAB — VITAMIN B12: Vitamin B12: 1244 pg/mL (ref 232–1245)

## 2016-11-03 LAB — EPSTEIN-BARR VIRUS VCA, IGG

## 2016-11-03 LAB — EPSTEIN-BARR VIRUS VCA, IGM

## 2016-11-04 LAB — HUMAN PARVOVIRUS DNA DETECTION BY PCR: Parvovirus B19, PCR: NEGATIVE

## 2016-11-08 NOTE — Progress Notes (Signed)
Erik Vaughn    HEMATOLOGY/ONCOLOGY CLINIC NOTE  Date of Service: 11/02/2016  Patient Care Team: Erik Kiel, MD as PCP - General (Internal Medicine) Erik Day, MD as Consulting Physician (Orthopedic Surgery)  CHIEF COMPLAINTS/PURPOSE OF CONSULTATION:  F/u for Burkitt's lymphoma  DIAGNOSIS BUrkitts Lymphoma currently in remission  Stage IVB Burkitt's lymphoma with t(8;14) translocation diagnosed 12/23/2015. This appears to be involving the sacrum causing sacral and root involvement with possible neurogenic bladder. PET CT scan as noted above shows extensive involvement with lymphoma. CSF negative for involvement. Bone marrow biopsy negative for involvement by lymphoma.  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 PET CT scan as noted above shows extensive involvement with lymphoma. CSF negative for involvement. Bone marrow biopsy negative for involvement by lymphoma. PET/CT scan after 3 cycles of EPOCH-R  on 03/02/2016  showed significant interval metabolic response.  Patient has completed cycle 6 of EPOCH-R and tolerated it well with no prohibitive toxicities. Grade 1 fatigue.  He has had some episodes of fevers including a periodontal abscess,  non-neutropenic fevers - unclear etiology, recent Escherichia coli urinary tract infection x 2 and Clostridium difficile diarrhea.  PET CT scan done after completion of this planned chemotherapy shows no clear evidence of residual lymphoma . Some borderline FDG avidity in the left sacral ala ?pathologic fracture vs residual lymphoma  Patient was evaluated by radiation oncology - no RT recommended to sacral lesion.  CURRENT TREATMENT -active surveillance  Previous treatment The patient has completed 6 cycles of EPOCH -R and IT methotrexate x 4 doses starting with cycle 3 for CNS prophylaxis   HISTORY OF  PRESENTING ILLNESS:  Please see my previous note for details  INTERVAL HISTORY  Erik Vaughn is here for a follow-up with his wife for his follow-up after his recent repeat PET/CT scan. His PET/CT scan was reviewed in detail with him and his wife and shows no overt evidence of lymphoma recurrence at this time. His LDH levels and repeat have normalized as well. His blood tests today showed a very low ANC of 100 which is unexplained. Patient notes he feels great with no fevers no chills and no other acute focal symptoms. Did have a URI like symptoms a couple weeks ago.   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   He smoked about 2 packs a Vaughn for 40 years.  SURGICAL HISTORY:  #1 left submandibular salivary gland benign tumor excision #2 port placement - 12/31/2015  SOCIAL HISTORY: Social History   Social History  . Marital status: Married    Spouse name: N/A  . Number of children: N/A  . Years of education: N/A   Occupational History  . meter specialist    Social History Main Topics  . Smoking status: Former Smoker    Packs/Vaughn: 2.00    Years: 16.00    Quit date: 12/22/2005  . Smokeless tobacco: Current User    Types: Chew  . Alcohol use No  . Drug use: No  . Sexual activity: No   Other Topics Concern  . Not on file   Social History Narrative  . No narrative on file  smoked 2 packs of cigarettes per Vaughn for 40 years quit about 8-9 years ago. Denies significant alcohol use. 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  FAMILY HISTORY:  Sister had breast cancer at age 76 years. Unknown if she had genetic testing One  maternal uncle had melanoma Second maternal uncle had pancreatic cancer under the age of 23yrPaternal uncle with prostate cancer Dad had prostate cancer at age 951years   ALLERGIES:  has No Known Allergies.  MEDICATIONS:  Current Outpatient Prescriptions  Medication Sig Dispense Refill  . amLODipine (NORVASC) 10 MG tablet Take 1 tablet (10 mg total) by mouth every evening. 30 tablet 0  . b complex vitamins tablet Take 1 tablet by mouth daily.    . Cholecalciferol (VITAMIN D3) 2000 units TABS Take 2,000 Units by mouth daily with lunch.     . clopidogrel (PLAVIX) 75 MG tablet Take 1 tablet (75 mg total) by mouth every evening.    . DULoxetine (CYMBALTA) 60 MG capsule Take 1 capsule (60 mg total) by mouth daily. 30 capsule 0  . lisinopril (PRINIVIL,ZESTRIL) 20 MG tablet Take 20 mg by mouth every evening.     . Omega-3 Fatty Acids (FISH OIL) 875 MG CAPS Take by mouth.    . rosuvastatin (CRESTOR) 5 MG tablet Take 5 mg by mouth daily.    . vitamin C (ASCORBIC ACID) 500 MG tablet Take 500 mg by mouth 2 (two) times daily.     No current facility-administered medications for this visit.     REVIEW OF SYSTEMS:    10 Point review of Systems was done is negative except as noted above.  PHYSICAL EXAMINATION: ECOG PERFORMANCE STATUS: 1 - Symptomatic but completely ambulatory  . Vitals:   11/02/16 0936  BP: 132/68  Pulse: 75  Resp: 20  Temp: 97.9 F (36.6 C)   Filed Weights   11/02/16 0936  Weight: 193 lb 4.8 oz (87.7 kg)   .Body mass index is 26.22 kg/m.  GENERAL:alert, in no acute distress and comfortable SKIN: skin color, texture, turgor are normal, no rashes or significant lesions EYES: normal, conjunctiva are pink and non-injected, sclera clear OROPHARYNX:no exudate, no erythema and lips, buccal mucosa, and tongue normal  NECK: supple, no JVD, thyroid normal size, non-tender, without nodularity LYMPH:  no palpable lymphadenopathy in the cervical, axillary or  inguinal LUNGS: clear to auscultation with normal respiratory effort, large left anterior chest wall mass noted appears nontender and fixed. HEART: regular rate & rhythm,  no murmurs and no lower extremity edema ABDOMEN: abdomen soft, non-tender, normoactive bowel sounds , no palpable hepatosplenomegaly. Musculoskeletal: no cyanosis of digits and no clubbing  PSYCH: alert & oriented x 3 with fluent speech NEURO: no focal motor/sensory deficits  LABORATORY DATA:  I have reviewed the data as listed . CBC Latest Ref Rng & Units 11/02/2016 11/02/2016 11/02/2016  WBC 4.0 - 10.3 10e3/uL 1.8(L) - 2.1(L)  Hemoglobin 13.0 - 17.1 g/dL 12.6(L) - 12.1(L)  Hematocrit 38.4 - 49.9 % 38.0(L) 37.6 36.8(L)  Platelets 140 - 400 10e3/uL 264 - 255    CBC    Component Value Date/Time   WBC 1.8 (L) 11/02/2016 1048   WBC 3.7 (L) 07/20/2016 1008   RBC 4.67 11/02/2016 1048   RBC 5.03 07/20/2016 1008   HGB 12.6 (L) 11/02/2016 1048   HCT 38.0 (L) 11/02/2016 1048   PLT 264 11/02/2016 1048   MCV 81.4 11/02/2016 1048  MCH 27.0 (L) 11/02/2016 1048   MCH 26.8 07/20/2016 1008   MCHC 33.2 11/02/2016 1048   MCHC 33.4 07/20/2016 1008   RDW 15.2 (H) 11/02/2016 1048   LYMPHSABS 1.1 11/02/2016 1048   MONOABS 0.4 11/02/2016 1048   EOSABS 0.2 11/02/2016 1048   BASOSABS 0.1 11/02/2016 1048    ANC 100  CMP Latest Ref Rng & Units 11/02/2016 10/12/2016 07/13/2016  Glucose 70 - 140 mg/dl 108 100 98  BUN 7.0 - 26.0 mg/dL 14.8 13.1 10.0  Creatinine 0.7 - 1.3 mg/dL 0.8 0.7 0.8  Sodium 136 - 145 mEq/L 140 141 141  Potassium 3.5 - 5.1 mEq/L 4.4 4.1 4.1  Chloride 101 - 111 mmol/L - - -  CO2 22 - 29 mEq/L 28 25 26   Calcium 8.4 - 10.4 mg/dL 10.0 9.7 9.9  Total Protein 6.4 - 8.3 g/dL 7.1 6.7 6.7  Total Bilirubin 0.20 - 1.20 mg/dL 0.82 0.72 0.46  Alkaline Phos 40 - 150 U/L 119 111 130  AST 5 - 34 U/L 25 25 23   ALT 0 - 55 U/L 29 24 20        RADIOGRAPHIC STUDIES: I have personally reviewed the radiological images as listed  and agreed with the findings in the report.  .Nm Pet Image Restag (ps) Skull Base To Thigh  Result Date: 10/28/2016 CLINICAL DATA:  Subsequent treatment strategy for lymphoma. Burkitt's lymphoma. Increase and LDH. EXAM: NUCLEAR MEDICINE PET SKULL BASE TO THIGH TECHNIQUE: 9.5 mCi F-18 FDG was injected intravenously. Full-ring PET imaging was performed from the skull base to thigh after the radiotracer. CT data was obtained and used for attenuation correction and anatomic localization. FASTING BLOOD GLUCOSE:  Value: 119 in mg/dl COMPARISON:  PET-CT scan 05/17/2016 FINDINGS: NECK No hypermetabolic lymph nodes in the neck. CHEST No hypermetabolic mediastinal or hilar nodes. No suspicious pulmonary nodules on the CT scan. ABDOMEN/PELVIS Diffuse hypermetabolic activity through the stomach. Moderate hiatal hernia. No abnormal hypermetabolic activity within the liver, pancreas, adrenal glands, or spleen. Spleen is normal volume. No hypermetabolic lymph nodes in the abdomen or pelvis. Prostate is enlarged.  Atherosclerotic calcification of the aorta. SKELETON No focal hypermetabolic activity to suggest skeletal metastasis. IMPRESSION: 1. No evidence of lymphoma recurrence. 2. Activity through the stomach is favored physiologic Electronically Signed   By: Suzy Bouchard M.D.   On: 10/28/2016 17:06    ASSESSMENT & PLAN:   66 year old Caucasian male with  #1 Stage IVB Burkitt's lymphoma with t(8;14) translocation Patient has completed cycle 6 of EPOCH-R and CNS prophylaxis with IT MTX - tolerated it well with no prohibitive toxicities.  Patient was evaluated by radiation oncology and no ISRT was recommended. His LDH level appears to have been increased and he had a repeat PET/CT scan on 10/29/2015 that shows no overt evidence of lymphoma recurrence.  #2 new significant neutropenia ANC 100. No fevers no chills no new focal symptoms no bone pains. ?post viral. No new medications. Workup was sent  today Component     Latest Ref Rng & Units 11/02/2016  Folate, Hemolysate     Not Estab. ng/mL >620.0  HCT     37.5 - 51.0 % 37.6  Folate, RBC     >498 ng/mL >1649  EBV VCA IgM     0.0 - 35.9 U/mL <36.0  EBV VCA IgG     0.0 - 17.9 U/mL >600.0 (H)  CMV IgM Ser EIA-aCnc     0.0 - 29.9 AU/mL <30.0  CMV Ab - IgG  0.00 - 0.59 U/mL 3.60 (H)  Parvovirus B19, PCR     Negative Negative  Vitamin B12     232 - 1245 pg/mL 1,244  Copper     72 - 166 ug/dL 91   Plan -PET/CT scan was reviewed in detail with the patient. -Labs are sent for neutropenia evaluation and was reviewed as noted above. -I discussed with the patient and he has been scheduled for bone marrow biopsy to evaluate his significant new neutropenia.  #3 loose stools/diarrhea. Previous history of C. Difficile. -Ordered GI stool panel and Clostridium difficile testing.  #4 left sacral ala and S1 fracture. L5-S1 and S1-S2 left-sided nerve root compression. #4 history of peripheral arterial disease #5 chemotherapy related neuropathy - controlled Plan -Patient's chemotherapy related neuropathy has nearly completely resolved but he does have some persistent symptoms in his left forefoot. -It is unusual for chemotherapy reluctant neuropathy to cause persistent unilateral symptoms. Patient was recommended to follow-up with his primary care physician to rule out peripheral arterial disease and a radiculopathy as other possible etiologies of his persistent left foot symptoms. -He continues to be on Cymbalta 30 mg by mouth daily .  #4 Hypertension, dyslipidemia, peripheral arterial disease -Continue management as per primary care physician.  #5 ex-smoker with 80-pack-year history of smoking.  Additional labs and stool studies today CT guided Bone marrow aspirartion/biopsy in 1 week  RTC with Dr Irene Limbo in 2 weeks with labsAdditional labs and stool studies today CT guided Bone marrow aspirartion/biopsy in 1 week  RTC with Dr Irene Limbo  in 2 weeks with labs   . Orders Placed This Encounter  Procedures  . Fecal, C-Dff PCR    Standing Status:   Future    Standing Expiration Date:   11/02/2017    Order Specific Question:   Is your patient experiencing loose or watery stools (3 or more in 24 hours)?    Answer:   Yes    Order Specific Question:   Has the patient received laxatives in the last 24 hours?    Answer:   No    Order Specific Question:   Has a negative Cdiff test resulted in the last 7 days?    Answer:   No  . CT Biopsy    Standing Status:   Future    Standing Expiration Date:   11/02/2017    Order Specific Question:   Lab orders requested (DO NOT place separate lab orders, these will be automatically ordered during procedure specimen collection):    Answer:   Surgical Pathology    Comments:   flow cytometry and other molecular testing as needed    Order Specific Question:   Reason for Exam (SYMPTOM  OR DIAGNOSIS REQUIRED)    Answer:   Patient with h/o Burkitts lymphoma with new severe neutropenia for U/L Iliac crest bone marrow biopsy    Order Specific Question:   Preferred imaging location?    Answer:   [facility]    Order Specific Question:   Radiology Contrast Protocol - do NOT remove file path    Answer:   \\[email or web address]  . CT BONE MARROW BIOPSY & ASPIRATION    Standing Status:   Future    Standing Expiration Date:   02/02/2018    Order Specific Question:   Reason for Exam (SYMPTOM  OR DIAGNOSIS REQUIRED)    Answer:   Patient with h/o Burkitts lymphoma with new severe neutropenia for U/L Iliac crest bone marrow biopsy    Order  Specific Question:   Preferred imaging location?    Answer:   [facility]    Order Specific Question:   Radiology Contrast Protocol - do NOT remove file path    Answer:   \\[email or web address]  . CBC & Diff and Retic    Standing Status:   Future    Number of Occurrences:   1    Standing Expiration Date:    11/02/2017  . Comprehensive metabolic panel    Standing Status:   Future    Number of Occurrences:   1    Standing Expiration Date:   11/02/2017  . Lactate dehydrogenase    Standing Status:   Future    Number of Occurrences:   1    Standing Expiration Date:   11/02/2017  . Vitamin B12    Standing Status:   Future    Number of Occurrences:   1    Standing Expiration Date:   11/02/2017  . Folate RBC    Standing Status:   Future    Number of Occurrences:   1    Standing Expiration Date:   11/02/2017  . Copper, serum    Standing Status:   Future    Number of Occurrences:   1    Standing Expiration Date:   11/02/2017  . Smear    Standing Status:   Future    Number of Occurrences:   1    Standing Expiration Date:   11/02/2017  . Gastrointestinal Pathogen Panel PCR    Standing Status:   Future    Number of Occurrences:   1    Standing Expiration Date:   11/02/2017  . Epstein-Barr virus VCA, IgM    Standing Status:   Future    Number of Occurrences:   1    Standing Expiration Date:   11/02/2017  . Epstein-Barr virus VCA, IgG    Standing Status:   Future    Number of Occurrences:   1    Standing Expiration Date:   11/02/2017  . CMV IgM    Standing Status:   Future    Number of Occurrences:   1    Standing Expiration Date:   11/02/2017  . CMV antibody, IgG (EIA)    Standing Status:   Future    Number of Occurrences:   1    Standing Expiration Date:   11/02/2017  . Human parvovirus DNA detection by PCR    Standing Status:   Future    Number of Occurrences:   1    Standing Expiration Date:   11/02/2017  . CBC & Diff and Retic    Standing Status:   Future    Standing Expiration Date:   11/02/2017     I spent 30 minutes counseling the patient face to face. The total time spent in the appointment was 40 minutes and more than 50% was on counseling and direct patient cares and co-ordinating inpatient hospitalization logistics    Sullivan Lone MD [place] AAHIVMS [facility] Hematology/Oncology Physician [facility]  (Office):       [phone] (Work cell):  [phone] (Fax):           [phone]

## 2016-11-09 ENCOUNTER — Other Ambulatory Visit (HOSPITAL_COMMUNITY)
Admission: RE | Admit: 2016-11-09 | Discharge: 2016-11-09 | Disposition: A | Discharge status: Home / Self Care | Payer: PRIVATE HEALTH INSURANCE | Source: Ambulatory Visit | Attending: Hematology | Admitting: Hematology

## 2016-11-09 ENCOUNTER — Ambulatory Visit: Payer: PRIVATE HEALTH INSURANCE

## 2016-11-09 DIAGNOSIS — R197 Diarrhea, unspecified: Secondary | ICD-10-CM | POA: Insufficient documentation

## 2016-11-09 DIAGNOSIS — D709 Neutropenia, unspecified: Secondary | ICD-10-CM

## 2016-11-09 DIAGNOSIS — C8378 Burkitt lymphoma, lymph nodes of multiple sites: Secondary | ICD-10-CM

## 2016-11-09 LAB — GASTROINTESTINAL PANEL BY PCR, STOOL (REPLACES STOOL CULTURE)
ASTROVIRUS: NOT DETECTED
Adenovirus F40/41: NOT DETECTED
CYCLOSPORA CAYETANENSIS: NOT DETECTED
Campylobacter species: NOT DETECTED
Cryptosporidium: NOT DETECTED
ENTAMOEBA HISTOLYTICA: NOT DETECTED
ENTEROTOXIGENIC E COLI (ETEC): NOT DETECTED
Enteroaggregative E coli (EAEC): NOT DETECTED
Enteropathogenic E coli (EPEC): DETECTED — AB
Giardia lamblia: NOT DETECTED
Norovirus GI/GII: NOT DETECTED
Plesimonas shigelloides: NOT DETECTED
Rotavirus A: NOT DETECTED
SAPOVIRUS (I, II, IV, AND V): NOT DETECTED
Salmonella species: NOT DETECTED
Shiga like toxin producing E coli (STEC): NOT DETECTED
Shigella/Enteroinvasive E coli (EIEC): NOT DETECTED
VIBRIO CHOLERAE: NOT DETECTED
VIBRIO SPECIES: NOT DETECTED
YERSINIA ENTEROCOLITICA: NOT DETECTED

## 2016-11-09 LAB — C DIFFICILE QUICK SCREEN W PCR REFLEX
C DIFFICLE (CDIFF) ANTIGEN: NEGATIVE
C Diff interpretation: NOT DETECTED
C Diff toxin: NEGATIVE

## 2016-11-10 ENCOUNTER — Other Ambulatory Visit: Payer: Self-pay | Admitting: Radiology

## 2016-11-10 ENCOUNTER — Other Ambulatory Visit: Payer: Self-pay | Admitting: Hematology

## 2016-11-10 MED ORDER — CIPROFLOXACIN HCL 500 MG PO TABS: 500.0000 mg | ORAL_TABLET | Freq: Two times a day (BID) | ORAL | 0 refills | Status: DC

## 2016-11-11 ENCOUNTER — Ambulatory Visit (HOSPITAL_COMMUNITY)
Admission: RE | Admit: 2016-11-11 | Discharge: 2016-11-11 | Disposition: A | Discharge status: Home / Self Care | Payer: PRIVATE HEALTH INSURANCE | Source: Ambulatory Visit | Attending: Hematology | Admitting: Hematology

## 2016-11-11 ENCOUNTER — Encounter (HOSPITAL_COMMUNITY): Payer: Self-pay

## 2016-11-11 DIAGNOSIS — Z87891 Personal history of nicotine dependence: Secondary | ICD-10-CM | POA: Insufficient documentation

## 2016-11-11 DIAGNOSIS — D709 Neutropenia, unspecified: Secondary | ICD-10-CM | POA: Insufficient documentation

## 2016-11-11 DIAGNOSIS — C8378 Burkitt lymphoma, lymph nodes of multiple sites: Secondary | ICD-10-CM | POA: Diagnosis not present

## 2016-11-11 DIAGNOSIS — I10 Essential (primary) hypertension: Secondary | ICD-10-CM | POA: Insufficient documentation

## 2016-11-11 DIAGNOSIS — Z79899 Other long term (current) drug therapy: Secondary | ICD-10-CM | POA: Diagnosis not present

## 2016-11-11 LAB — CBC
HEMATOCRIT: 39.6 % (ref 39.0–52.0)
HEMOGLOBIN: 13.1 g/dL (ref 13.0–17.0)
MCH: 26.3 pg (ref 26.0–34.0)
MCHC: 33.1 g/dL (ref 30.0–36.0)
MCV: 79.4 fL (ref 78.0–100.0)
Platelets: 274 10*3/uL (ref 150–400)
RBC: 4.99 MIL/uL (ref 4.22–5.81)
RDW: 14.3 % (ref 11.5–15.5)
WBC: 5.5 10*3/uL (ref 4.0–10.5)

## 2016-11-11 LAB — PROTIME-INR
INR: 1.04
Prothrombin Time: 13.6 seconds (ref 11.4–15.2)

## 2016-11-11 LAB — APTT: APTT: 28 s (ref 24–36)

## 2016-11-11 MED ORDER — MIDAZOLAM HCL 2 MG/2ML IJ SOLN
INTRAMUSCULAR | Status: AC
  Filled 2016-11-11: qty 4

## 2016-11-11 MED ORDER — MIDAZOLAM HCL 2 MG/2ML IJ SOLN
INTRAMUSCULAR | Status: DC | PRN
  Administered 2016-11-11 (×2): 1 mg via INTRAVENOUS

## 2016-11-11 MED ORDER — FENTANYL CITRATE (PF) 100 MCG/2ML IJ SOLN
INTRAMUSCULAR | Status: DC | PRN
Start: 2016-11-11 — End: 2016-11-12
  Administered 2016-11-11 (×2): 50 ug via INTRAVENOUS

## 2016-11-11 MED ORDER — SODIUM CHLORIDE 0.9 % IV SOLN
INTRAVENOUS | Status: DC
  Administered 2016-11-11: 07:00:00 via INTRAVENOUS

## 2016-11-11 MED ORDER — FENTANYL CITRATE (PF) 100 MCG/2ML IJ SOLN
INTRAMUSCULAR | Status: AC
  Filled 2016-11-11: qty 4

## 2016-11-11 NOTE — Discharge Instructions (Signed)
Bone Marrow Aspiration and Bone Marrow Biopsy, Adult, Care After °This sheet gives you information about how to care for yourself after your procedure. Your health care provider may also give you more specific instructions. If you have problems or questions, contact your health care provider. °What can I expect after the procedure? °After the procedure, it is common to have: °· Mild pain and tenderness. °· Swelling. °· Bruising. ° °Follow these instructions at home: °· Take over-the-counter or prescription medicines only as told by your health care provider. °· Do not take baths, swim, or use a hot tub until your health care provider approves. Ask if you can take a shower or have a sponge bath. °· Follow instructions from your health care provider about how to take care of the puncture site. Make sure you: °? Wash your hands with soap and water before you change your bandage (dressing). If soap and water are not available, use hand sanitizer. °? Change your dressing as told by your health care provider. °· Check your puncture site every day for signs of infection. Check for: °? More redness, swelling, or pain. °? More fluid or blood. °? Warmth. °? Pus or a bad smell. °· Return to your normal activities as told by your health care provider. Ask your health care provider what activities are safe for you. °· Do not drive for 24 hours if you were given a medicine to help you relax (sedative). °· Keep all follow-up visits as told by your health care provider. This is important. °Contact a health care provider if: °· You have more redness, swelling, or pain around the puncture site. °· You have more fluid or blood coming from the puncture site. °· Your puncture site feels warm to the touch. °· You have pus or a bad smell coming from the puncture site. °· You have a fever. °· Your pain is not controlled with medicine. °This information is not intended to replace advice given to you by your health care provider. Make sure  you discuss any questions you have with your health care provider. °Document Released: 12/04/2004 Document Revised: 12/05/2015 Document Reviewed: 10/29/2015 °Elsevier Interactive Patient Education © 2018 [employer] °Moderate Conscious Sedation, Adult, Care After °These instructions provide you with information about caring for yourself after your procedure. Your health care provider may also give you more specific instructions. Your treatment has been planned according to current medical practices, but problems sometimes occur. Call your health care provider if you have any problems or questions after your procedure. °What can I expect after the procedure? °After your procedure, it is common: °· To feel sleepy for several hours. °· To feel clumsy and have poor balance for several hours. °· To have poor judgment for several hours. °· To vomit if you eat too soon. ° °Follow these instructions at home: °For at least 24 hours after the procedure: ° °· Do not: °? Participate in activities where you could fall or become injured. °? Drive. °? Use heavy machinery. °? Drink alcohol. °? Take sleeping pills or medicines that cause drowsiness. °? Make important decisions or sign legal documents. °? Take care of children on your own. °· Rest. °Eating and drinking °· Follow the diet recommended by your health care provider. °· If you vomit: °? Drink water, juice, or soup when you can drink without vomiting. °? Make sure you have little or no nausea before eating solid foods. °General instructions °· Have a responsible adult stay with you until you are   awake and alert.  Take over-the-counter and prescription medicines only as told by your health care provider.  If you smoke, do not smoke without supervision.  Keep all follow-up visits as told by your health care provider. This is important. Contact a health care provider if:  You keep feeling nauseous or you keep vomiting.  You feel light-headed.  You develop a  rash.  You have a fever. Get help right away if:  You have trouble breathing. This information is not intended to replace advice given to you by your health care provider. Make sure you discuss any questions you have with your health care provider. Document Released: 03/07/2013 Document Revised: 10/20/2015 Document Reviewed: 09/06/2015 Elsevier Interactive Patient Education  [employer].

## 2016-11-11 NOTE — H&P (Signed)
Chief Complaint: Patient was seen in consultation today for bone marrow biopsy at the request of Brunetta Genera  Referring Physician(s): Brunetta Genera  Supervising Physician: Marybelle Killings  Patient Status: [facility] - Out-pt  History of Present Illness: Erik Vaughn is a 66 y.o. male with Burkitt's lymphoma. He is referred for bone marrow biopsy. He previously had one in 12/2015, tolerated well. PMHx, meds, labs, imaging reviewed. Has been NPO this am. Feels well Family at bedside   Past Medical History:  Diagnosis Date  . Cancer ([place])    Burkett Lymphoma  . Hypertension   . PAD (peripheral artery disease) ([facility]) left leg    Past Surgical History:  Procedure Laterality Date  . IR GENERIC HISTORICAL  12/31/2015   IR FLUORO GUIDE CV LINE RIGHT 12/31/2015 Arne Cleveland, MD [facility]  . IR GENERIC HISTORICAL  12/31/2015   IR US GUIDE VASC ACCESS RIGHT 12/31/2015 Arne Cleveland, MD [facility]  . IR GENERIC HISTORICAL  07/20/2016   IR REMOVAL TUN ACCESS W/ PORT W/O FL MOD SED 07/20/2016 Jacqulynn Cadet, MD [facility]  . left salivary Left    removed    Allergies: Patient has no known allergies.  Medications: Prior to Admission medications   Medication Sig Start Date End Date Taking? Authorizing Provider  amLODipine (NORVASC) 10 MG tablet Take 1 tablet (10 mg total) by mouth every evening. 11/02/16  Yes Brunetta Genera, MD  b complex vitamins tablet Take 1 tablet by mouth daily.   Yes [provider]  ciprofloxacin (CIPRO) 500 MG tablet Take 1 tablet (500 mg total) by mouth 2 (two) times daily. 11/10/16  Yes Brunetta Genera, MD  clopidogrel (PLAVIX) 75 MG tablet Take 1 tablet (75 mg total) by mouth every evening. 04/03/16  Yes Brunetta Genera, MD  DULoxetine (CYMBALTA) 60 MG capsule Take 1 capsule (60 mg total) by mouth daily. 10/14/16  Yes Brunetta Genera, MD  lisinopril (PRINIVIL,ZESTRIL) 20 MG tablet Take 20 mg by mouth every evening.     Yes [provider]  Omega-3 Fatty Acids (FISH OIL) 875 MG CAPS Take by mouth.   Yes [provider]  rosuvastatin (CRESTOR) 5 MG tablet Take 5 mg by mouth daily.   Yes [provider]  Cholecalciferol (VITAMIN D3) 2000 units TABS Take 2,000 Units by mouth daily with lunch.     [provider]  vitamin C (ASCORBIC ACID) 500 MG tablet Take 500 mg by mouth 2 (two) times daily.    [provider]     Family History  Problem Relation Age of Onset  . Alzheimer's disease Father 26    Social History   Social History  . Marital status: Married    Spouse name: N/A  . Number of children: N/A  . Years of education: N/A   Occupational History  . meter specialist    Social History Main Topics  . Smoking status: Former Smoker    Packs/day: 2.00    Years: 16.00    Quit date: 12/22/2005  . Smokeless tobacco: Current User    Types: Chew  . Alcohol use No  . Drug use: No  . Sexual activity: No   Other Topics Concern  . None   Social History Narrative  . None    Review of Systems: A 12 point ROS discussed and pertinent positives are indicated in the HPI above.  All other systems are negative.  Review of Systems  Vital Signs: BP 126/75 (BP  Location: Right Arm)   Pulse 78   Temp 98.1 F (36.7 C) (Oral)   Resp 20   Ht 6' (1.829 m)   Wt 193 lb 4.8 oz (87.7 kg)   SpO2 98%   BMI 26.22 kg/m   Physical Exam  Constitutional: He is oriented to person, place, and time. He appears well-developed and well-nourished. No distress.  HENT:  Head: Normocephalic.  Mouth/Throat: Oropharynx is clear and moist.  Neck: Normal range of motion. No JVD present. No tracheal deviation present.  Cardiovascular: Normal rate, regular rhythm and normal heart sounds.   Pulmonary/Chest: Effort normal and breath sounds normal. No respiratory distress.  Abdominal: Soft. He exhibits no distension. There is no tenderness.  Neurological: He is alert and oriented to  person, place, and time.  Skin: Skin is warm and dry.  Psychiatric: He has a normal mood and affect. Judgment normal.    Mallampati Score:  MD Evaluation Airway: WNL Heart: WNL Abdomen: WNL Chest/ Lungs: WNL ASA  Classification: 2 Mallampati/Airway Score: One  Imaging: Nm Pet Image Restag (ps) Skull Base To Thigh  Result Date: 10/28/2016 CLINICAL DATA:  Subsequent treatment strategy for lymphoma. Burkitt's lymphoma. Increase and LDH. EXAM: NUCLEAR MEDICINE PET SKULL BASE TO THIGH TECHNIQUE: 9.5 mCi F-18 FDG was injected intravenously. Full-ring PET imaging was performed from the skull base to thigh after the radiotracer. CT data was obtained and used for attenuation correction and anatomic localization. FASTING BLOOD GLUCOSE:  Value: 119 in mg/dl COMPARISON:  PET-CT scan 05/17/2016 FINDINGS: NECK No hypermetabolic lymph nodes in the neck. CHEST No hypermetabolic mediastinal or hilar nodes. No suspicious pulmonary nodules on the CT scan. ABDOMEN/PELVIS Diffuse hypermetabolic activity through the stomach. Moderate hiatal hernia. No abnormal hypermetabolic activity within the liver, pancreas, adrenal glands, or spleen. Spleen is normal volume. No hypermetabolic lymph nodes in the abdomen or pelvis. Prostate is enlarged.  Atherosclerotic calcification of the aorta. SKELETON No focal hypermetabolic activity to suggest skeletal metastasis. IMPRESSION: 1. No evidence of lymphoma recurrence. 2. Activity through the stomach is favored physiologic Electronically Signed   By: Suzy Bouchard M.D.   On: 10/28/2016 17:06    Labs:  CBC:  Recent Labs  10/12/16 0900 11/02/16 0922 11/02/16 1047 11/02/16 1048 11/11/16 0715  WBC 3.8* 2.1*  --  1.8* 5.5  HGB 12.2* 12.1*  --  12.6* 13.1  HCT 37.6* 36.8* 37.6 38.0* 39.6  PLT 237 255  --  264 274    COAGS:  Recent Labs  12/23/15 1320 02/07/16 0359 04/19/16 1056 07/20/16 1008 11/11/16 0715  INR 1.17 1.28  --  1.00 1.04  APTT 31 45* 26  --   28    BMP:  Recent Labs  04/20/16 0356 04/21/16 0413 04/22/16 0454 04/23/16 0511 05/18/16 0908 07/13/16 0908 10/12/16 0900 11/02/16 1048  NA 137 139 139 138 140 141 141 140  K 4.7 4.1 4.0 3.7 4.4 4.1 4.1 4.4  CL 105 107 104 102  --   --   --   --   CO2 _0 GLUCOSE 186* 171* 149* 129* 82 98 100 108  BUN _1 11.0 10.0 13.1 14.8  CALCIUM 9.3 9.3 9.4 9.3 9.5 9.9 9.7 10.0  CREATININE 0.59* 0.65 0.70 0.66 0.7 0.8 0.7 0.8  GFRNONAA >60 >60 >60 >60  --   --   --   --   GFRAA >60 >60 >60 >60  --   --   --   --  LIVER FUNCTION TESTS:  Recent Labs  05/18/16 0908 07/13/16 0908 10/12/16 0900 11/02/16 1048  BILITOT 0.43 0.46 0.72 0.82  AST _0 ALT _1 ALKPHOS 107 130 111 119  PROT 6.3* 6.7 6.7 7.1  ALBUMIN 3.5 4.1 4.2 4.5    TUMOR MARKERS: No results for input(s): AFPTM, CEA, CA199, CHROMGRNA in the last 8760 hours.  Assessment and Plan: Lymphoma For CT guided bone marrow biopsy Labs ok Risks and Benefits discussed with the patient including, but not limited to bleeding, infection, damage to adjacent structures or low yield requiring additional tests. All of the patient's questions were answered, patient is agreeable to proceed. Consent signed and in chart.    Thank you for this interesting consult.  I greatly enjoyed meeting Mcclellan Demarais and look forward to participating in their care.  A copy of this report was sent to the requesting provider on this date.  Electronically Signed: Ascencion Dike, PA-C 11/11/2016, 8:49 AM   I spent a total of 20 minutes in face to face in clinical consultation, greater than 50% of which was counseling/coordinating care for bone marrow biopsy

## 2016-11-11 NOTE — Procedures (Signed)
BM aspirate and core EBL 0 Comp 0 

## 2016-11-16 ENCOUNTER — Encounter: Payer: Self-pay | Admitting: Hematology

## 2016-11-16 ENCOUNTER — Other Ambulatory Visit (HOSPITAL_BASED_OUTPATIENT_CLINIC_OR_DEPARTMENT_OTHER): Payer: PRIVATE HEALTH INSURANCE

## 2016-11-16 ENCOUNTER — Ambulatory Visit (HOSPITAL_BASED_OUTPATIENT_CLINIC_OR_DEPARTMENT_OTHER): Payer: PRIVATE HEALTH INSURANCE | Admitting: Hematology

## 2016-11-16 ENCOUNTER — Telehealth: Payer: Self-pay | Admitting: Hematology

## 2016-11-16 VITALS — BP 147/70 | HR 85 | Temp 97.5°F | Resp 18 | Ht 72.0 in | Wt 190.5 lb

## 2016-11-16 DIAGNOSIS — D709 Neutropenia, unspecified: Secondary | ICD-10-CM | POA: Diagnosis not present

## 2016-11-16 DIAGNOSIS — C8378 Burkitt lymphoma, lymph nodes of multiple sites: Secondary | ICD-10-CM

## 2016-11-16 DIAGNOSIS — I10 Essential (primary) hypertension: Secondary | ICD-10-CM

## 2016-11-16 DIAGNOSIS — A044 Other intestinal Escherichia coli infections: Secondary | ICD-10-CM | POA: Diagnosis not present

## 2016-11-16 DIAGNOSIS — G620 Drug-induced polyneuropathy: Secondary | ICD-10-CM

## 2016-11-16 DIAGNOSIS — R197 Diarrhea, unspecified: Secondary | ICD-10-CM

## 2016-11-16 LAB — CBC & DIFF AND RETIC
BASO%: 0.4 % (ref 0.0–2.0)
Basophils Absolute: 0 10*3/uL (ref 0.0–0.1)
EOS ABS: 0.2 10*3/uL (ref 0.0–0.5)
EOS%: 3.2 % (ref 0.0–7.0)
HEMATOCRIT: 38.9 % (ref 38.4–49.9)
HGB: 12.8 g/dL — ABNORMAL LOW (ref 13.0–17.1)
IMMATURE RETIC FRACT: 3.3 % (ref 3.00–10.60)
LYMPH%: 16.6 % (ref 14.0–49.0)
MCH: 26.8 pg — ABNORMAL LOW (ref 27.2–33.4)
MCHC: 32.9 g/dL (ref 32.0–36.0)
MCV: 81.6 fL (ref 79.3–98.0)
MONO#: 0.9 10*3/uL (ref 0.1–0.9)
MONO%: 12.2 % (ref 0.0–14.0)
NEUT#: 5 10*3/uL (ref 1.5–6.5)
NEUT%: 67.6 % (ref 39.0–75.0)
PLATELETS: 229 10*3/uL (ref 140–400)
RBC: 4.77 10*6/uL (ref 4.20–5.82)
RDW: 14.4 % (ref 11.0–14.6)
RETIC CT ABS: 48.18 10*3/uL (ref 34.80–93.90)
Retic %: 1.01 % (ref 0.80–1.80)
WBC: 7.4 10*3/uL (ref 4.0–10.3)
lymph#: 1.2 10*3/uL (ref 0.9–3.3)
nRBC: 0 % (ref 0–0)

## 2016-11-16 NOTE — Telephone Encounter (Signed)
Scheduled appt per 6/19 los - Gave patient AVS and calender .

## 2016-11-16 NOTE — Progress Notes (Signed)
[name]    HEMATOLOGY/ONCOLOGY CLINIC NOTE  Date of Service: 11/16/2016  Patient Care Team: Ernestene Kiel, MD as PCP - General (Internal Medicine) [name], MD as Consulting Physician (Orthopedic Surgery)  CHIEF COMPLAINTS/PURPOSE OF CONSULTATION:  F/u for Burkitt's lymphoma  DIAGNOSIS BUrkitts Lymphoma currently in remission  Stage IVB Burkitt's lymphoma with t(8;14) translocation diagnosed 12/23/2015. This appears to be involving the sacrum causing sacral and root involvement with possible neurogenic bladder. PET CT scan as noted above shows extensive involvement with lymphoma. CSF negative for involvement. Bone marrow biopsy negative for involvement by lymphoma.  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 PET CT scan as noted above shows extensive involvement with lymphoma. CSF negative for involvement. Bone marrow biopsy negative for involvement by lymphoma. PET/CT scan after 3 cycles of EPOCH-R  on 03/02/2016  showed significant interval metabolic response.  Patient has completed cycle 6 of EPOCH-R and tolerated it well with no prohibitive toxicities. Grade 1 fatigue.  He has had some episodes of fevers including a periodontal abscess,  non-neutropenic fevers - unclear etiology, recent Escherichia coli urinary tract infection x 2 and Clostridium difficile diarrhea.  PET CT scan done after completion of this planned chemotherapy shows no clear evidence of residual lymphoma . Some borderline FDG avidity in the left sacral ala ?pathologic fracture vs residual lymphoma  Patient was evaluated by radiation oncology - no RT recommended to sacral lesion.  CURRENT TREATMENT -Active surveillance  Previous treatment The patient has completed 6 cycles of EPOCH -R and IT methotrexate x 4 doses starting with cycle 3 for CNS prophylaxis   HISTORY OF  PRESENTING ILLNESS:  Please see my previous note for details  INTERVAL HISTORY  Erik Vaughn is here for a follow-up with his wife for his follow-up after his recent bone marrow biopsy for severe neutropenia with an ANC 100. We discussed the bone marrow biopsy in our tumor but this morning. He was noted to have slightly hypocellular bone marrow with no evidence of lymphoma, dysplastic changes or other overt primary bone marrow pathology. Neutropenia has since resolved and could've been from a viral infection, his recent Escherichia coli gastroenteritis. Could also be from delay due to neutropenia caused by Rituxan which has now resolved. Patient notes his diarrhea has improved with ciprofloxacin. No other acute new symptoms. He was recommended to not eat out as frequently due to risk of recurrent GI infections.  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   He smoked about 2 packs a day for 40 years.  SURGICAL HISTORY:  #1 left submandibular salivary gland benign tumor excision #2 port placement - 12/31/2015  SOCIAL HISTORY: Social History   Social History  . Marital status: Married    Spouse name: N/A  . Number of children: N/A  . Years of education: N/A   Occupational History  . meter specialist    Social History Main Topics  . Smoking status: Former Smoker    Packs/day: 2.00    Years: 16.00    Quit date: 12/22/2005  . Smokeless tobacco: Current User    Types: Chew  . Alcohol use No  . Drug use: No  . Sexual activity: No   Other Topics Concern  . Not on file   Social History  Narrative  . No narrative on file  smoked 2 packs of cigarettes per day for 40 years quit about 8-9 years ago. Denies significant alcohol use. 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  FAMILY HISTORY:  Sister had breast cancer at age 16 years. Unknown if she had genetic testing One maternal uncle had melanoma Second maternal uncle had pancreatic cancer under the age of 73yrPaternal uncle with prostate cancer Dad had prostate cancer at age 3868years   ALLERGIES:  has No Known Allergies.  MEDICATIONS:  Current Outpatient Prescriptions  Medication Sig Dispense Refill  . amLODipine (NORVASC) 10 MG tablet Take 1 tablet (10 mg total) by mouth every evening. 30 tablet 0  . b complex vitamins tablet Take 1 tablet by mouth daily.    . Cholecalciferol (VITAMIN D3) 2000 units TABS Take 2,000 Units by mouth daily with lunch.     . ciprofloxacin (CIPRO) 500 MG tablet Take 1 tablet (500 mg total) by mouth 2 (two) times daily. 14 tablet 0  . clopidogrel (PLAVIX) 75 MG tablet Take 1 tablet (75 mg total) by mouth every evening.    . DULoxetine (CYMBALTA) 60 MG capsule Take 1 capsule (60 mg total) by mouth daily. 30 capsule 0  . lisinopril (PRINIVIL,ZESTRIL) 20 MG tablet Take 20 mg by mouth every evening.     . Omega-3 Fatty Acids (FISH OIL) 875 MG CAPS Take by mouth.    . rosuvastatin (CRESTOR) 5 MG tablet Take 5 mg by mouth daily.    . vitamin C (ASCORBIC ACID) 500 MG tablet Take 500 mg by mouth 2 (two) times daily.     No current facility-administered medications for this visit.     REVIEW OF SYSTEMS:    10 Point review of Systems was done is negative except as noted above.  PHYSICAL EXAMINATION: ECOG PERFORMANCE STATUS: 1 - Symptomatic but completely ambulatory  . Vitals:   11/16/16 0927  BP: (!) 147/70  Pulse: 85  Resp: 18  Temp: 97.5 F (36.4 C)   Filed Weights   11/16/16 0927  Weight: 190 lb 8 oz (86.4 kg)   .Body mass index is 25.84 kg/m.  GENERAL:alert, in no acute distress and comfortable SKIN: skin  color, texture, turgor are normal, no rashes or significant lesions EYES: normal, conjunctiva are pink and non-injected, sclera clear OROPHARYNX:no exudate, no erythema and lips, buccal mucosa, and tongue normal  NECK: supple, no JVD, thyroid normal size, non-tender, without nodularity LYMPH:  no palpable lymphadenopathy in the cervical, axillary or inguinal LUNGS: clear to auscultation with normal respiratory effort, large left anterior chest wall mass noted appears nontender and fixed. HEART: regular rate & rhythm,  no murmurs and no lower extremity edema ABDOMEN: abdomen soft, non-tender, normoactive bowel sounds , no palpable hepatosplenomegaly. Musculoskeletal: no cyanosis of digits and no clubbing  PSYCH: alert & oriented x 3 with fluent speech NEURO: no focal motor/sensory deficits  LABORATORY DATA:  I have reviewed the data as listed . CBC Latest Ref Rng & Units 11/16/2016 11/11/2016 11/02/2016  WBC 4.0 - 10.3 10e3/uL 7.4 5.5 1.8(L)  Hemoglobin 13.0 - 17.1 g/dL 12.8(L) 13.1 12.6(L)  Hematocrit 38.4 - 49.9 % 38.9 39.6 38.0(L)  Platelets 140 - 400 10e3/uL 229 274 264   ANC 100---> 5000  CBC    Component  Value Date/Time   WBC 7.4 11/16/2016 0906   WBC 5.5 11/11/2016 0715   RBC 4.77 11/16/2016 0906   RBC 4.99 11/11/2016 0715   HGB 12.8 (L) 11/16/2016 0906   HCT 38.9 11/16/2016 0906   PLT 229 11/16/2016 0906   MCV 81.6 11/16/2016 0906   MCH 26.8 (L) 11/16/2016 0906   MCH 26.3 11/11/2016 0715   MCHC 32.9 11/16/2016 0906   MCHC 33.1 11/11/2016 0715   RDW 14.4 11/16/2016 0906   LYMPHSABS 1.2 11/16/2016 0906   MONOABS 0.9 11/16/2016 0906   EOSABS 0.2 11/16/2016 0906   BASOSABS 0.0 11/16/2016 0906    ANC 100  CMP Latest Ref Rng & Units 11/02/2016 10/12/2016 07/13/2016  Glucose 70 - 140 mg/dl 108 100 98  BUN 7.0 - 26.0 mg/dL 14.8 13.1 10.0  Creatinine 0.7 - 1.3 mg/dL 0.8 0.7 0.8  Sodium 136 - 145 mEq/L 140 141 141  Potassium 3.5 - 5.1 mEq/L 4.4 4.1 4.1  Chloride 101 - 111  mmol/L - - -  CO2 22 - 29 mEq/L _0 Calcium 8.4 - 10.4 mg/dL 10.0 9.7 9.9  Total Protein 6.4 - 8.3 g/dL 7.1 6.7 6.7  Total Bilirubin 0.20 - 1.20 mg/dL 0.82 0.72 0.46  Alkaline Phos 40 - 150 U/L 119 111 130  AST 5 - 34 U/L _1 ALT 0 - 55 U/L _2 Component     Latest Ref Rng & Units 11/02/2016  Folate, Hemolysate     Not Estab. ng/mL >620.0  HCT     37.5 - 51.0 % 37.6  Folate, RBC     >498 ng/mL >1649  EBV VCA IgM     0.0 - 35.9 U/mL <36.0  EBV VCA IgG     0.0 - 17.9 U/mL >600.0 (H)  CMV IgM Ser EIA-aCnc     0.0 - 29.9 AU/mL <30.0  CMV Ab - IgG     0.00 - 0.59 U/mL 3.60 (H)  Parvovirus B19, PCR     Negative Negative  Vitamin B12     232 - 1245 pg/mL 1,244  Copper     72 - 166 ug/dL 91      RADIOGRAPHIC STUDIES: I have personally reviewed the radiological images as listed and agreed with the findings in the report.  .Nm Pet Image Restag (ps) Skull Base To Thigh  Result Date: 10/28/2016 CLINICAL DATA:  Subsequent treatment strategy for lymphoma. Burkitt's lymphoma. Increase and LDH. EXAM: NUCLEAR MEDICINE PET SKULL BASE TO THIGH TECHNIQUE: 9.5 mCi F-18 FDG was injected intravenously. Full-ring PET imaging was performed from the skull base to thigh after the radiotracer. CT data was obtained and used for attenuation correction and anatomic localization. FASTING BLOOD GLUCOSE:  Value: 119 in mg/dl COMPARISON:  PET-CT scan 05/17/2016 FINDINGS: NECK No hypermetabolic lymph nodes in the neck. CHEST No hypermetabolic mediastinal or hilar nodes. No suspicious pulmonary nodules on the CT scan. ABDOMEN/PELVIS Diffuse hypermetabolic activity through the stomach. Moderate hiatal hernia. No abnormal hypermetabolic activity within the liver, pancreas, adrenal glands, or spleen. Spleen is normal volume. No hypermetabolic lymph nodes in the abdomen or pelvis. Prostate is enlarged.  Atherosclerotic calcification of the aorta. SKELETON No focal hypermetabolic activity to suggest  skeletal metastasis. IMPRESSION: 1. No evidence of lymphoma recurrence. 2. Activity through the stomach is favored physiologic Electronically Signed   By: Suzy Bouchard M.D.   On: 10/28/2016 17:06   Ct Biopsy  Result Date: 11/11/2016 INDICATION: Lymphoma EXAM:  CT BIOPSY; CT BONE MARROW BIOPSY AND ASPIRATION MEDICATIONS: None. ANESTHESIA/SEDATION: Fentanyl 100 mcg IV; Versed 2 mg IV Moderate Sedation Time:  10 The patient was continuously monitored during the procedure by the interventional radiology nurse under my direct supervision. FLUOROSCOPY TIME:  None COMPLICATIONS: None immediate. PROCEDURE: Informed written consent was obtained from the back after a thorough discussion of the procedural risks, benefits and alternatives. All questions were addressed. Maximal Sterile Barrier Technique was utilized including caps, mask, sterile gowns, sterile gloves, sterile drape, hand hygiene and skin antiseptic. A timeout was performed prior to the initiation of the procedure. Under CT guidance, a(n) 11 gauge guide needle was advanced into the right iliac bone. Aspirates and a core were obtained. Post biopsy images demonstrate no hemorrhage. Patient tolerated the procedure well without complication. Vital sign monitoring by nursing staff during the procedure will continue as patient is in the special procedures unit for post procedure observation. FINDINGS: The images document guide needle placement within the right iliac bone. Post biopsy images demonstrate no hemorrhage. IMPRESSION: Successful CT-guided bone marrow aspirate and core from the right iliac bone. Electronically Signed   By: Marybelle Killings M.D.   On: 11/11/2016 10:28   Ct Bone Marrow Biopsy & Aspiration  Result Date: 11/11/2016 INDICATION: Lymphoma EXAM: CT BIOPSY; CT BONE MARROW BIOPSY AND ASPIRATION MEDICATIONS: None. ANESTHESIA/SEDATION: Fentanyl 100 mcg IV; Versed 2 mg IV Moderate Sedation Time:  10 The patient was continuously monitored during the  procedure by the interventional radiology nurse under my direct supervision. FLUOROSCOPY TIME:  None COMPLICATIONS: None immediate. PROCEDURE: Informed written consent was obtained from the back after a thorough discussion of the procedural risks, benefits and alternatives. All questions were addressed. Maximal Sterile Barrier Technique was utilized including caps, mask, sterile gowns, sterile gloves, sterile drape, hand hygiene and skin antiseptic. A timeout was performed prior to the initiation of the procedure. Under CT guidance, a(n) 11 gauge guide needle was advanced into the right iliac bone. Aspirates and a core were obtained. Post biopsy images demonstrate no hemorrhage. Patient tolerated the procedure well without complication. Vital sign monitoring by nursing staff during the procedure will continue as patient is in the special procedures unit for post procedure observation. FINDINGS: The images document guide needle placement within the right iliac bone. Post biopsy images demonstrate no hemorrhage. IMPRESSION: Successful CT-guided bone marrow aspirate and core from the right iliac bone. Electronically Signed   By: Marybelle Killings M.D.   On: 11/11/2016 10:28    ASSESSMENT & PLAN:   66 year old Caucasian male with  #1 Stage IVB Burkitt's lymphoma with t(8;14) translocation Patient has completed cycle 6 of EPOCH-R and CNS prophylaxis with IT MTX - tolerated it well with no prohibitive toxicities.  Patient was evaluated by radiation oncology and no ISRT was recommended. His LDH level appears to have been increased and he had a repeat PET/CT scan on 10/29/2015 that shows no overt evidence of lymphoma recurrence. Repeat LDH levels have normalized  #2 new significant neutropenia ANC 100. No fevers no chills no new focal symptoms no bone pains. ?post viral. No new medications. Likely could be from post Rituxan delayed neutropenia. Workup was sent today Component     Latest Ref Rng & Units 11/02/2016    Folate, Hemolysate     Not Estab. ng/mL >620.0  HCT     37.5 - 51.0 % 37.6  Folate, RBC     >498 ng/mL >1649  EBV VCA IgM     0.0 -  35.9 U/mL <36.0  EBV VCA IgG     0.0 - 17.9 U/mL >600.0 (H)  CMV IgM Ser EIA-aCnc     0.0 - 29.9 AU/mL <30.0  CMV Ab - IgG     0.00 - 0.59 U/mL 3.60 (H)  Parvovirus B19, PCR     Negative Negative  Vitamin B12     232 - 1245 pg/mL 1,244  Copper     72 - 166 ug/dL 91   #3 Escherichia coli gastroenteritis now resolved with oral ciprofloxacin Plan -The patient's neutropenia has now resolved on follow-up. -This appears to be likely related to post Rituxan delayed neutropenia which is now resolving. -Could be from his Escherichia coli gastroenteritis which is now resolving with antibiotics.  #3 loose stools/diarrhea. Previous history of C. Difficile. Stool panel showed enteropathogenic Escherichia coli -Treated with ciprofloxacin especially in the setting of neutropenia. Diarrhea has now resolved. -Recommended using over-the-counter probiotics and cultured yogurt to reduce the risk of recurrent C. Difficile.  #4 left sacral ala and S1 fracture. L5-S1 and S1-S2 left-sided nerve root compression. #4 history of peripheral arterial disease #5 chemotherapy related neuropathy - controlled Plan -Patient's chemotherapy related neuropathy has nearly completely resolved but he does have some persistent symptoms in his left forefoot. -It is unusual for chemotherapy reluctant neuropathy to cause persistent unilateral symptoms. Patient was recommended to follow-up with his primary care physician to rule out peripheral arterial disease and a radiculopathy as other possible etiologies of his persistent left foot symptoms. -He continues to be on Cymbalta -dose increased to 60 mg by mouth daily .  #4 Hypertension, dyslipidemia, peripheral arterial disease -Continue management as per primary care physician.  #5 Ex-smoker with 80-pack-year history of smoking.  RTC  with Dr Irene Limbo in 3 months with rpt labs   . Orders Placed This Encounter  Procedures  . CBC & Diff and Retic    Standing Status:   Future    Standing Expiration Date:   11/16/2017  . Comprehensive metabolic panel    Standing Status:   Future    Standing Expiration Date:   11/16/2017  . Lactate dehydrogenase    Standing Status:   Future    Standing Expiration Date:   11/16/2017     I spent 25 minutes counseling the patient face to face. The total time spent in the appointment was 40 minutes and more than 50% was on counseling and direct patient cares and co-ordinating care with the hematology team/tumor board    Erik Lone MD [place] AAHIVMS [facility] [place] [facility] Hematology/Oncology Physician [facility]  (Office):       [phone] (Work cell):  [phone] (Fax):           [phone]

## 2016-11-25 ENCOUNTER — Encounter (HOSPITAL_COMMUNITY): Payer: Self-pay

## 2016-12-08 ENCOUNTER — Telehealth: Payer: Self-pay

## 2016-12-08 NOTE — Telephone Encounter (Signed)
Attempt to call patient. Norvasc to be refilled or reordered by PCP. Fax sent back to pharmacy with note attached to involve PCP. Unsure if patient has found new PCP yet.

## 2016-12-09 ENCOUNTER — Telehealth: Payer: Self-pay

## 2016-12-09 LAB — CHROMOSOME ANALYSIS, BONE MARROW

## 2016-12-09 NOTE — Telephone Encounter (Signed)
Patient needs norvasc reorder request sent to PCP. This was a one time order while pt looked for a new PCP. Fax sent.

## 2017-02-10 DIAGNOSIS — Z0000 Encounter for general adult medical examination without abnormal findings: Secondary | ICD-10-CM | POA: Diagnosis not present

## 2017-02-11 NOTE — Progress Notes (Signed)
[name]    HEMATOLOGY/ONCOLOGY CLINIC NOTE  Date of Service: 02/16/2017   Patient Care Team: Ernestene Kiel, MD as PCP - General (Internal Medicine) [name], MD as Consulting Physician (Orthopedic Surgery)  CHIEF COMPLAINTS/PURPOSE OF CONSULTATION:  F/u for Burkitt's lymphoma  DIAGNOSIS BUrkitts Lymphoma currently in remission  Stage IVB Burkitt's lymphoma with t(8;14) translocation diagnosed 12/23/2015. This appears to be involving the sacrum causing sacral and root involvement with possible neurogenic bladder. PET CT scan as noted above shows extensive involvement with lymphoma. CSF negative for involvement. Bone marrow biopsy negative for involvement by lymphoma.  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 PET CT scan as noted above shows extensive involvement with lymphoma. CSF negative for involvement. Bone marrow biopsy negative for involvement by lymphoma. PET/CT scan after 3 cycles of EPOCH-R  on 03/02/2016  showed significant interval metabolic response.  Patient has completed cycle 6 of EPOCH-R and tolerated it well with no prohibitive toxicities. Grade 1 fatigue.  He has had some episodes of fevers including a periodontal abscess,  non-neutropenic fevers - unclear etiology, recent Escherichia coli urinary tract infection x 2 and Clostridium difficile diarrhea.  PET CT scan done after completion of this planned chemotherapy shows no clear evidence of residual lymphoma . Some borderline FDG avidity in the left sacral ala ?pathologic fracture vs residual lymphoma  Patient was evaluated by radiation oncology - no RT recommended to sacral lesion.  CURRENT TREATMENT -Active surveillance  Previous treatment The patient has completed 6 cycles of EPOCH -R and IT methotrexate x 4 doses starting with cycle 3 for CNS prophylaxis   HISTORY OF  PRESENTING ILLNESS:  Please see my previous note for details  INTERVAL HISTORY:  Erik Vaughn is here for a follow-up of his Burkitts lymphoma. . He notes he has been good. His last treatment was about 10 months ago. He eats well and feels well. His Blood counts are normalized now. His LDH is normal and so is his blood chemistry.  He has gained some weight and has no new symptoms nor concerns. He started taking metamucil for fibers His neuropathy has improved significantly and it is tolerable. He denies any enlarged lymph nodes.  His PCP, Dr. Laqueta Due, wants to know if he can get vaccinated. I advised him not to take shingles vaccine until 2 years after his chemotherapy. I suggest he gets flu shot and PCV and Prevnar Vaccine. he should get  Prevnar Vaccine first and PCV23 2-3 months after prevnar.. I offered the flu vaccine today and he declined. 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   He smoked about 2 packs a day for 40 years.  SURGICAL HISTORY:  #1 left submandibular salivary gland benign tumor excision #2 port placement - 12/31/2015  SOCIAL HISTORY: Social History   Social History  . Marital status: Married    Spouse name: N/A  . Number of children: N/A  . Years of education: N/A   Occupational History  . meter specialist    Social History Main Topics  . Smoking status: Former Smoker    Packs/day: 2.00    Years:  16.00    Quit date: 12/22/2005  . Smokeless tobacco: Current User    Types: Chew  . Alcohol use No  . Drug use: No  . Sexual activity: No   Other Topics Concern  . Not on file   Social History Narrative  . No narrative on file  smoked 2 packs of cigarettes  per day for 40 years quit about 8-9 years ago. Denies significant alcohol use. 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  FAMILY HISTORY:  Sister had breast cancer at age 11 years. Unknown if she had genetic testing One maternal uncle had melanoma Second maternal uncle had pancreatic cancer under the age of 72yrPaternal uncle with prostate cancer Dad had prostate cancer at age 3020years   ALLERGIES:  has No Known Allergies.  MEDICATIONS:  Current Outpatient Prescriptions  Medication Sig Dispense Refill  . amLODipine (NORVASC) 10 MG tablet Take 1 tablet (10 mg total) by mouth every evening. 30 tablet 0  . b complex vitamins tablet Take 1 tablet by mouth daily.    . clopidogrel (PLAVIX) 75 MG tablet Take 1 tablet (75 mg total) by mouth every evening.    .[name] Kitchenlisinopril (PRINIVIL,ZESTRIL) 20 MG tablet Take 20 mg by mouth every evening.     . rosuvastatin (CRESTOR) 5 MG tablet Take 5 mg by mouth daily.     No current facility-administered medications for this visit.     REVIEW OF SYSTEMS:    10 Point review of Systems was done is negative except as noted above.  PHYSICAL EXAMINATION: ECOG PERFORMANCE STATUS: 1 - Symptomatic but completely ambulatory  . Vitals:   02/16/17 0832  BP: 140/65  Pulse: 64  Resp: 18  Temp: 98 F (36.7 C)  SpO2: 100%   Filed Weights   02/16/17 0832  Weight: 200 lb 11.2 oz (91 kg)   .Body mass index is 27.22 kg/m.  GENERAL:alert, in no acute distress and comfortable SKIN: skin color, texture, turgor are normal, no rashes or significant lesions EYES: normal, conjunctiva are pink and non-injected, sclera clear OROPHARYNX:no exudate, no erythema and lips, buccal mucosa, and tongue normal  NECK: supple, no JVD, thyroid normal size, non-tender, without nodularity LYMPH:  no  palpable lymphadenopathy in the cervical, axillary or inguinal LUNGS: clear to auscultation with normal respiratory effort, large left anterior chest wall mass noted appears nontender and fixed. HEART: regular rate & rhythm,  no murmurs and no lower extremity edema ABDOMEN: abdomen soft, non-tender, normoactive bowel sounds , no palpable hepatosplenomegaly. Musculoskeletal: no cyanosis of digits and no clubbing  PSYCH: alert & oriented x 3 with fluent speech NEURO: no focal motor/sensory deficits  LABORATORY DATA:  I have reviewed the data as listed . CBC Latest Ref Rng & Units 02/16/2017 11/16/2016 11/11/2016  WBC 4.0 - 10.3 10e3/uL 5.9 7.4 5.5  Hemoglobin 13.0 - 17.1 g/dL 13.1 12.8(L) 13.1  Hematocrit 38.4 - 49.9 % 39.8 38.9 39.6  Platelets 140 - 400 10e3/uL 209 229 274   ANC 100---> 5000  CBC    Component Value Date/Time   WBC 5.9 02/16/2017 0754   WBC 5.5 11/11/2016 0715   RBC 4.72 02/16/2017 0754   RBC 4.99 11/11/2016 0715   HGB 13.1 02/16/2017 0754   HCT 39.8 02/16/2017 0754   PLT 209 02/16/2017 0754  MCV 84.3 02/16/2017 0754   MCH 27.8 02/16/2017 0754   MCH 26.3 11/11/2016 0715   MCHC 32.9 02/16/2017 0754   MCHC 33.1 11/11/2016 0715   RDW 14.8 (H) 02/16/2017 0754   LYMPHSABS 1.7 02/16/2017 0754   MONOABS 0.6 02/16/2017 0754   EOSABS 0.2 02/16/2017 0754   BASOSABS 0.0 02/16/2017 0754    ANC 100  CMP Latest Ref Rng & Units 02/16/2017 11/02/2016 10/12/2016  Glucose 70 - 140 mg/dl 105 108 100  BUN 7.0 - 26.0 mg/dL 11.8 14.8 13.1  Creatinine 0.7 - 1.3 mg/dL 0.8 0.8 0.7  Sodium 136 - 145 mEq/L 140 140 141  Potassium 3.5 - 5.1 mEq/L 4.2 4.4 4.1  Chloride 101 - 111 mmol/L - - -  CO2 22 - 29 mEq/L _0 Calcium 8.4 - 10.4 mg/dL 9.8 10.0 9.7  Total Protein 6.4 - 8.3 g/dL 7.1 7.1 6.7  Total Bilirubin 0.20 - 1.20 mg/dL 0.70 0.82 0.72  Alkaline Phos 40 - 150 U/L 92 119 111  AST 5 - 34 U/L _1 ALT 0 - 55 U/L _2 . Lab Results  Component Value Date   LDH  151 02/16/2017       RADIOGRAPHIC STUDIES: I have personally reviewed the radiological images as listed and agreed with the findings in the report.  .No results found.  ASSESSMENT & PLAN:   66 year old Caucasian male with  #1 Stage IVB Burkitt's lymphoma with t(8;14) translocation Patient has completed cycle 6 of EPOCH-R and CNS prophylaxis with IT MTX - tolerated it well with no prohibitive toxicities.  Patient was evaluated by radiation oncology and no ISRT was recommended. His LDH level appears to have been increased and he had a repeat PET/CT scan on 10/29/2015 that shows no overt evidence of lymphoma recurrence. Repeat LDH levels remain normalize today at 151.  #2 s/p neutropenia post Rituxan delayed neutropenia- remains resolved. Resolved now Nitro at 3.4K (02/16/17)  #3  left sacral ala and S1 fracture. L5-S1 and S1-S2 left-sided nerve root compression -resolving symptoms.  #6 history of peripheral arterial disease  #7 chemotherapy related neuropathy - nearly resolved.  #8 Hypertension, dyslipidemia, peripheral arterial disease -Continue management as per primary care physician.  #9 Ex-smoker with 80-pack-year history of smoking.   PLAN:  -patient was thoroughly evaluation and is not noted to have any clinical or lab evidence of Lymphoma recurrence at this time. -CBC, CMP and LDH are all normal -I advised patient to get his pneumonia vaccines - he prefers to get these with his PCP . If not received in the last 5 yrs would need Prevnar followed by PCV 2-3 months later. -I advised avoiding Live virus vaccines like the shingles vaccine for 2 years after chemotherapy.   RTC with Dr Irene Limbo in 3 months with rpt labs  . Orders Placed This Encounter  Procedures  . CBC & Diff and Retic    Standing Status:   Future    Standing Expiration Date:   02/16/2018  . Comprehensive metabolic panel    Standing Status:   Future    Standing Expiration Date:   02/16/2018  . Lactate  dehydrogenase    Standing Status:   Future    Standing Expiration Date:   02/16/2018   I spent 20 minutes counseling the patient face to face. The total time spent in the appointment was 25 minutes and more than 50% was on counseling and direct patient cares and co-ordinating care with  the hematology team/tumor board  This document serves as a record of services personally performed by Erik Lone, MD. It was created on her behalf by Erik Vaughn, a trained medical scribe. The creation of this record is based on the scribe's personal observations and the provider's statements to them. This document has been checked and approved by the attending provider.    Erik Lone MD [place] AAHIVMS [facility] Hematology/Oncology Physician [facility]  (Office):       [phone] (Work cell):  [phone] (Fax):           [phone]

## 2017-02-16 ENCOUNTER — Ambulatory Visit (HOSPITAL_BASED_OUTPATIENT_CLINIC_OR_DEPARTMENT_OTHER): Payer: PRIVATE HEALTH INSURANCE | Admitting: Hematology

## 2017-02-16 ENCOUNTER — Telehealth: Payer: Self-pay | Admitting: Hematology

## 2017-02-16 ENCOUNTER — Other Ambulatory Visit (HOSPITAL_BASED_OUTPATIENT_CLINIC_OR_DEPARTMENT_OTHER): Payer: PRIVATE HEALTH INSURANCE

## 2017-02-16 VITALS — BP 140/65 | HR 64 | Temp 98.0°F | Resp 18 | Ht 72.0 in | Wt 200.7 lb

## 2017-02-16 DIAGNOSIS — D709 Neutropenia, unspecified: Secondary | ICD-10-CM | POA: Diagnosis not present

## 2017-02-16 DIAGNOSIS — C8378 Burkitt lymphoma, lymph nodes of multiple sites: Secondary | ICD-10-CM | POA: Diagnosis not present

## 2017-02-16 DIAGNOSIS — Z8572 Personal history of non-Hodgkin lymphomas: Secondary | ICD-10-CM

## 2017-02-16 DIAGNOSIS — I10 Essential (primary) hypertension: Secondary | ICD-10-CM

## 2017-02-16 LAB — CBC & DIFF AND RETIC
BASO%: 0.3 % (ref 0.0–2.0)
Basophils Absolute: 0 10*3/uL (ref 0.0–0.1)
EOS ABS: 0.2 10*3/uL (ref 0.0–0.5)
EOS%: 2.5 % (ref 0.0–7.0)
HEMATOCRIT: 39.8 % (ref 38.4–49.9)
HEMOGLOBIN: 13.1 g/dL (ref 13.0–17.1)
IMMATURE RETIC FRACT: 7.4 % (ref 3.00–10.60)
LYMPH#: 1.7 10*3/uL (ref 0.9–3.3)
LYMPH%: 29.3 % (ref 14.0–49.0)
MCH: 27.8 pg (ref 27.2–33.4)
MCHC: 32.9 g/dL (ref 32.0–36.0)
MCV: 84.3 fL (ref 79.3–98.0)
MONO#: 0.6 10*3/uL (ref 0.1–0.9)
MONO%: 9.9 % (ref 0.0–14.0)
NEUT%: 58 % (ref 39.0–75.0)
NEUTROS ABS: 3.4 10*3/uL (ref 1.5–6.5)
Platelets: 209 10*3/uL (ref 140–400)
RBC: 4.72 10*6/uL (ref 4.20–5.82)
RDW: 14.8 % — AB (ref 11.0–14.6)
RETIC %: 1.19 % (ref 0.80–1.80)
Retic Ct Abs: 56.17 10*3/uL (ref 34.80–93.90)
WBC: 5.9 10*3/uL (ref 4.0–10.3)

## 2017-02-16 LAB — COMPREHENSIVE METABOLIC PANEL
ALBUMIN: 4.3 g/dL (ref 3.5–5.0)
ALK PHOS: 92 U/L (ref 40–150)
ALT: 30 U/L (ref 0–55)
AST: 26 U/L (ref 5–34)
Anion Gap: 10 mEq/L (ref 3–11)
BILIRUBIN TOTAL: 0.7 mg/dL (ref 0.20–1.20)
BUN: 11.8 mg/dL (ref 7.0–26.0)
CALCIUM: 9.8 mg/dL (ref 8.4–10.4)
CO2: 27 mEq/L (ref 22–29)
Chloride: 103 mEq/L (ref 98–109)
Creatinine: 0.8 mg/dL (ref 0.7–1.3)
EGFR: >90 mL/min/{1.73_m2} (ref >90)
GLUCOSE: 105 mg/dL (ref 70–140)
Potassium: 4.2 mEq/L (ref 3.5–5.1)
SODIUM: 140 meq/L (ref 136–145)
TOTAL PROTEIN: 7.1 g/dL (ref 6.4–8.3)

## 2017-02-16 LAB — LACTATE DEHYDROGENASE: LDH: 151 U/L (ref 125–245)

## 2017-02-16 LAB — TECHNOLOGIST REVIEW

## 2017-02-16 NOTE — Telephone Encounter (Signed)
Gave avs and calendar for december °

## 2017-02-16 NOTE — Patient Instructions (Signed)
Thank you for choosing [facility] to provide your oncology and hematology care.  To afford each patient quality time with our providers, please arrive 30 minutes before your scheduled appointment time.  If you arrive late for your appointment, you may be asked to reschedule.  We strive to give you quality time with our providers, and arriving late affects you and other patients whose appointments are after yours.   If you are a no show for multiple scheduled visits, you may be dismissed from the clinic at the providers discretion.    Again, thank you for choosing [facility], our hope is that these requests will decrease the amount of time that you wait before being seen by our physicians.  ______________________________________________________________________  Should you have questions after your visit to the [facility], please contact our office at [phone] between the hours of 8:30 and 4:30 p.m.    Voicemails left after 4:30p.m will not be returned until the following business day.    For prescription refill requests, please have your pharmacy contact us directly.  Please also try to allow 48 hours for prescription requests.    Please contact the scheduling department for questions regarding scheduling.  For scheduling of procedures such as PET scans, CT scans, MRI, Ultrasound, etc please contact central scheduling at [phone].    Resources For Cancer Patients and Caregivers:   Oncolink.org:  A wonderful resource for patients and healthcare providers for information regarding your disease, ways to tract your treatment, what to expect, etc.     American Cancer Society:  800-227-2345  Can help patients locate various types of support and financial assistance  Cancer Care: 1-800-813-HOPE (4673) Provides financial assistance, online support groups, medication/co-pay assistance.    [place] DSS:  336-641-3447 Where to apply for food  stamps, Medicaid, and utility assistance  Medicare Rights Center: 800-333-4114 Helps people with Medicare understand their rights and benefits, navigate the Medicare system, and secure the quality healthcare they deserve  SCAT: 336-333-6589 [employer]'s shared-ride transportation service for eligible riders who have a disability that prevents them from riding the fixed route bus.    For additional information on assistance programs please contact our social worker:   Grier Hock/Abigail Elmore:  [phone]            

## 2017-05-20 ENCOUNTER — Encounter: Payer: Self-pay | Admitting: Hematology

## 2017-05-20 ENCOUNTER — Other Ambulatory Visit (HOSPITAL_BASED_OUTPATIENT_CLINIC_OR_DEPARTMENT_OTHER): Payer: PPO

## 2017-05-20 ENCOUNTER — Telehealth: Payer: Self-pay | Admitting: Hematology

## 2017-05-20 ENCOUNTER — Ambulatory Visit (HOSPITAL_BASED_OUTPATIENT_CLINIC_OR_DEPARTMENT_OTHER): Payer: PPO | Admitting: Hematology

## 2017-05-20 DIAGNOSIS — C8378 Burkitt lymphoma, lymph nodes of multiple sites: Secondary | ICD-10-CM

## 2017-05-20 DIAGNOSIS — I10 Essential (primary) hypertension: Secondary | ICD-10-CM

## 2017-05-20 DIAGNOSIS — Z8572 Personal history of non-Hodgkin lymphomas: Secondary | ICD-10-CM | POA: Diagnosis not present

## 2017-05-20 DIAGNOSIS — G620 Drug-induced polyneuropathy: Secondary | ICD-10-CM

## 2017-05-20 LAB — CBC & DIFF AND RETIC
BASO%: 0.3 % (ref 0.0–2.0)
Basophils Absolute: 0 10*3/uL (ref 0.0–0.1)
EOS%: 2.5 % (ref 0.0–7.0)
Eosinophils Absolute: 0.2 10*3/uL (ref 0.0–0.5)
HCT: 39.4 % (ref 38.4–49.9)
HGB: 13 g/dL (ref 13.0–17.1)
Immature Retic Fract: 6.4 % (ref 3.00–10.60)
LYMPH#: 1.4 10*3/uL (ref 0.9–3.3)
LYMPH%: 22.8 % (ref 14.0–49.0)
MCH: 28.5 pg (ref 27.2–33.4)
MCHC: 33 g/dL (ref 32.0–36.0)
MCV: 86.4 fL (ref 79.3–98.0)
MONO#: 0.6 10*3/uL (ref 0.1–0.9)
MONO%: 9.1 % (ref 0.0–14.0)
NEUT#: 4 10*3/uL (ref 1.5–6.5)
NEUT%: 65.3 % (ref 39.0–75.0)
PLATELETS: 207 10*3/uL (ref 140–400)
RBC: 4.56 10*6/uL (ref 4.20–5.82)
RDW: 13.7 % (ref 11.0–14.6)
Retic %: 1.3 % (ref 0.80–1.80)
Retic Ct Abs: 59.28 10*3/uL (ref 34.80–93.90)
WBC: 6.1 10*3/uL (ref 4.0–10.3)

## 2017-05-20 LAB — COMPREHENSIVE METABOLIC PANEL
ALT: 25 U/L (ref 0–55)
ANION GAP: 9 meq/L (ref 3–11)
AST: 23 U/L (ref 5–34)
Albumin: 4.2 g/dL (ref 3.5–5.0)
Alkaline Phosphatase: 86 U/L (ref 40–150)
BILIRUBIN TOTAL: 0.78 mg/dL (ref 0.20–1.20)
BUN: 12.9 mg/dL (ref 7.0–26.0)
CHLORIDE: 106 meq/L (ref 98–109)
CO2: 25 meq/L (ref 22–29)
Calcium: 9.3 mg/dL (ref 8.4–10.4)
Creatinine: 0.9 mg/dL (ref 0.7–1.3)
Glucose: 106 mg/dl (ref 70–140)
Potassium: 4.1 mEq/L (ref 3.5–5.1)
Sodium: 140 mEq/L (ref 136–145)
Total Protein: 6.8 g/dL (ref 6.4–8.3)

## 2017-05-20 LAB — LACTATE DEHYDROGENASE: LDH: 163 U/L (ref 125–245)

## 2017-05-20 NOTE — Patient Instructions (Signed)
Thank you for choosing [facility] to provide your oncology and hematology care.  To afford each patient quality time with our providers, please arrive 30 minutes before your scheduled appointment time.  If you arrive late for your appointment, you may be asked to reschedule.  We strive to give you quality time with our providers, and arriving late affects you and other patients whose appointments are after yours.   If you are a no show for multiple scheduled visits, you may be dismissed from the clinic at the providers discretion.    Again, thank you for choosing [facility], our hope is that these requests will decrease the amount of time that you wait before being seen by our physicians.  ______________________________________________________________________  Should you have questions after your visit to the [facility], please contact our office at [phone] between the hours of 8:30 and 4:30 p.m.    Voicemails left after 4:30p.m will not be returned until the following business day.    For prescription refill requests, please have your pharmacy contact us directly.  Please also try to allow 48 hours for prescription requests.    Please contact the scheduling department for questions regarding scheduling.  For scheduling of procedures such as PET scans, CT scans, MRI, Ultrasound, etc please contact central scheduling at [phone].    Resources For Cancer Patients and Caregivers:   Oncolink.org:  A wonderful resource for patients and healthcare providers for information regarding your disease, ways to tract your treatment, what to expect, etc.     American Cancer Society:  800-227-2345  Can help patients locate various types of support and financial assistance  Cancer Care: 1-800-813-HOPE (4673) Provides financial assistance, online support groups, medication/co-pay assistance.    [place] DSS:  336-641-3447 Where to apply for food  stamps, Medicaid, and utility assistance  Medicare Rights Center: 800-333-4114 Helps people with Medicare understand their rights and benefits, navigate the Medicare system, and secure the quality healthcare they deserve  SCAT: 336-333-6589 [place] [employer]'s shared-ride transportation service for eligible riders who have a disability that prevents them from riding the fixed route bus.    For additional information on assistance programs please contact our social worker:   Grier Hock/Abigail Elmore:  [phone]            

## 2017-05-20 NOTE — Telephone Encounter (Signed)
Gave avs but need to call with orders

## 2017-05-26 NOTE — Progress Notes (Signed)
Erik Vaughn    HEMATOLOGY/ONCOLOGY CLINIC NOTE  Date of Service: .05/20/2017   Patient Care Team: Ernestene Kiel, MD as PCP - General (Internal Medicine) [name], MD as Consulting Physician (Orthopedic Surgery)  CHIEF COMPLAINTS/PURPOSE OF CONSULTATION:  F/u for Burkitt's lymphoma  DIAGNOSIS BUrkitts Lymphoma currently in remission  Stage IVB Burkitt's lymphoma with t(8;14) translocation diagnosed 12/23/2015. This appears to be involving the sacrum causing sacral and root involvement with possible neurogenic bladder. PET CT scan as noted above shows extensive involvement with lymphoma. CSF negative for involvement. Bone marrow biopsy negative for involvement by lymphoma.  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 PET CT scan as noted above shows extensive involvement with lymphoma. CSF negative for involvement. Bone marrow biopsy negative for involvement by lymphoma. PET/CT scan after 3 cycles of EPOCH-R  on 03/02/2016  showed significant interval metabolic response.  Patient has completed cycle 6 of EPOCH-R and tolerated it well with no prohibitive toxicities. Grade 1 fatigue.  He has had some episodes of fevers including a periodontal abscess,  non-neutropenic fevers - unclear etiology, recent Escherichia coli urinary tract infection x 2 and Clostridium difficile diarrhea.  PET CT scan done after completion of this planned chemotherapy shows no clear evidence of residual lymphoma . Some borderline FDG avidity in the left sacral ala ?pathologic fracture vs residual lymphoma  Patient was evaluated by radiation oncology - no RT recommended to sacral lesion.  CURRENT TREATMENT -Active surveillance  Previous treatment The patient has completed 6 cycles of EPOCH -R and IT methotrexate x 4 doses starting with cycle 3 for CNS prophylaxis   HISTORY OF  PRESENTING ILLNESS:  Please see my previous note for details  INTERVAL HISTORY:  Erik Vaughn is here for a follow-up of his Burkitts lymphoma. . He notes he is doing well and has no acute new concerns. Strill has some left L5 radiculopathy vs PAD like symptoms .  No Fevers/chills/night sweats/weight loss or other constitutional symptoms.  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   He smoked about 2 packs a day for 40 years.  SURGICAL HISTORY:  #1 left submandibular salivary gland benign tumor excision #2 port placement - 12/31/2015  SOCIAL HISTORY: Social History   Socioeconomic History  . Marital status: Married    Spouse name: Not on file  . Number of children: Not on file  . Years of education: Not on file  . Highest education level: Not on file  Social Needs  . Financial resource strain: Not on file  . Food insecurity - worry: Not on file  . Food insecurity - inability: Not on file  . Transportation needs - medical: Not on file  . Transportation needs - non-medical: Not on file  Occupational History  . Occupation: Geneticist, molecular  Tobacco Use  . Smoking status: Former Smoker    Packs/day: 2.00    Years: 16.00    Pack years: 32.00    Last attempt to quit: 12/22/2005    Years since quitting: 11.4  . Smokeless tobacco: Current User    Types: Chew  Substance and Sexual Activity  . Alcohol use: No  . Drug use: No  . Sexual activity: No  Other Topics Concern  . Not on file  Social History Narrative  .  Not on file   smoked 2 packs of cigarettes per day for 40 years quit about 8-9 years ago. Denies significant alcohol use. 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  FAMILY HISTORY:  Sister had breast cancer at age 371 years. Unknown if she had genetic testing One maternal uncle had melanoma Second maternal uncle had pancreatic cancer under the age of 49yrPaternal uncle with prostate cancer Dad had prostate cancer at age 3745years   ALLERGIES:  has No Known Allergies.  MEDICATIONS:  Current Outpatient Medications  Medication Sig Dispense Refill  . amLODipine (NORVASC) 10 MG tablet Take 1 tablet (10 mg total) by mouth every evening. 30 tablet 0  . Ascorbic Acid (VITAMIN C) 100 MG tablet Take 100 mg by mouth daily.    .Erik Kitchenb complex vitamins tablet Take 1 tablet by mouth daily.    . clopidogrel (PLAVIX) 75 MG tablet Take 1 tablet (75 mg total) by mouth every evening.    .Erik Kitchenlisinopril (PRINIVIL,ZESTRIL) 20 MG tablet Take 20 mg by mouth every evening.     . rosuvastatin (CRESTOR) 5 MG tablet Take 5 mg by mouth daily.     No current facility-administered medications for this visit.     REVIEW OF SYSTEMS:    10 Point review of Systems was done is negative except as noted above.  PHYSICAL EXAMINATION: ECOG PERFORMANCE STATUS: 1 - Symptomatic but completely ambulatory  VS reviewed in EPIC GENERAL:alert, in no acute distress and comfortable SKIN: skin color, texture, turgor are normal, no rashes or significant lesions EYES: normal, conjunctiva are pink and non-injected, sclera clear OROPHARYNX:no exudate, no erythema and lips, buccal mucosa, and tongue normal  NECK: supple, no JVD, thyroid normal size, non-tender, without nodularity LYMPH:  no palpable lymphadenopathy in the cervical, axillary or inguinal LUNGS: clear to auscultation with normal respiratory effort, large left anterior chest wall mass noted appears nontender and fixed. HEART: regular rate & rhythm,  no murmurs and no lower extremity edema ABDOMEN: abdomen soft, non-tender, normoactive bowel sounds , no palpable  hepatosplenomegaly. Musculoskeletal: no cyanosis of digits and no clubbing  PSYCH: alert & oriented x 3 with fluent speech NEURO: no focal motor/sensory deficits  LABORATORY DATA:  I have reviewed the data as listed . CBC Latest Ref Rng & Units 05/20/2017 02/16/2017 11/16/2016  WBC 4.0 - 10.3 10e3/uL 6.1 5.9 7.4  Hemoglobin 13.0 - 17.1 g/dL 13.0 13.1 12.8(L)  Hematocrit 38.4 - 49.9 % 39.4 39.8 38.9  Platelets 140 - 400 10e3/uL 207 209 229   ANC 100---> 5000--->4000  CBC    Component Value Date/Time   WBC 6.1 05/20/2017 1012   WBC 5.5 11/11/2016 0715   RBC 4.56 05/20/2017 1012   RBC 4.99 11/11/2016 0715   HGB 13.0 05/20/2017 1012   HCT 39.4 05/20/2017 1012   PLT 207 05/20/2017 1012   MCV 86.4 05/20/2017 1012   MCH 28.5 05/20/2017 1012   MCH 26.3 11/11/2016 0715   MCHC 33.0 05/20/2017 1012   MCHC 33.1 11/11/2016 0715   RDW 13.7 05/20/2017 1012   LYMPHSABS 1.4 05/20/2017 1012   MONOABS 0.6 05/20/2017 1012   EOSABS 0.2 05/20/2017 1012   BASOSABS 0.0 05/20/2017 1012    ANC 100  CMP Latest Ref Rng & Units 05/20/2017 02/16/2017 11/02/2016  Glucose 70 - 140  mg/dl 106 105 108  BUN 7.0 - 26.0 mg/dL 12.9 11.8 14.8  Creatinine 0.7 - 1.3 mg/dL 0.9 0.8 0.8  Sodium 136 - 145 mEq/L 140 140 140  Potassium 3.5 - 5.1 mEq/L 4.1 4.2 4.4  Chloride 101 - 111 mmol/L - - -  CO2 22 - 29 mEq/L _0 Calcium 8.4 - 10.4 mg/dL 9.3 9.8 10.0  Total Protein 6.4 - 8.3 g/dL 6.8 7.1 7.1  Total Bilirubin 0.20 - 1.20 mg/dL 0.78 0.70 0.82  Alkaline Phos 40 - 150 U/L 86 92 119  AST 5 - 34 U/L _1 ALT 0 - 55 U/L _2 . Lab Results  Component Value Date   LDH 163 05/20/2017       RADIOGRAPHIC STUDIES: I have personally reviewed the radiological images as listed and agreed with the findings in the report.  .No results found.  ASSESSMENT & PLAN:   66 year old Caucasian male with  #1 Stage IVB Burkitt's lymphoma with t(8;14) translocation Patient has completed cycle 6 of EPOCH-R  and CNS prophylaxis with IT MTX - tolerated it well with no prohibitive toxicities.  Patient was evaluated by radiation oncology and no ISRT was recommended. His LDH level appears to have been increased and he had a repeat PET/CT scan on 10/29/2015 that shows no overt evidence of lymphoma recurrence. Repeat LDH levels remain normalize today at 163  #2 s/p neutropenia post Rituxan delayed neutropenia- remains resolved. Resolved now [place] at 4K (02/16/17)  #3  left sacral ala and S1 fracture. L5-S1 and S1-S2 left-sided nerve root compression -resolving symptoms.  #6 history of peripheral arterial disease  #7 chemotherapy related neuropathy - nearly resolved.  #8 Hypertension, dyslipidemia, peripheral arterial disease -Continue management as per primary care physician.  #9 Ex-smoker with 80-pack-year history of smoking.  PLAN:  -patient was thoroughly evaluated and is not noted to have any clinical or lab evidence of Lymphoma recurrence at this time. He is now about 12 months out from completing his treatment. -CBC, CMP and LDH are all normal -maintain active lifestyle. -he was reminded to ensure that he gets his Prevnar and PCV vaccination with PCP. - no indication for additional treatment of his NHL at this time.  RTC with Dr Irene Limbo in 3 months with rpt labs  . Orders Placed This Encounter  Procedures  . CBC & Diff and Retic    Standing Status:   Future    Standing Expiration Date:   05/20/2018  . Comprehensive metabolic panel    Standing Status:   Future    Standing Expiration Date:   05/20/2018  . Lactate dehydrogenase    Standing Status:   Future    Standing Expiration Date:   05/20/2018   I spent 15 minutes counseling the patient face to face. The total time spent in the appointment was 20 minutes and more than 50% was on counseling and direct patient cares and co-ordinating care with the hematology team/tumor board   Erik Lone MD [place] AAHIVMS [facility] Hematology/Oncology  Physician [facility]  (Office):       [phone] (Work cell):  [phone] (Fax):           [phone]

## 2017-07-25 DIAGNOSIS — H2513 Age-related nuclear cataract, bilateral: Secondary | ICD-10-CM | POA: Diagnosis not present

## 2017-07-25 DIAGNOSIS — H52221 Regular astigmatism, right eye: Secondary | ICD-10-CM | POA: Diagnosis not present

## 2017-08-17 NOTE — Progress Notes (Signed)
Erik Vaughn    HEMATOLOGY/ONCOLOGY CLINIC NOTE  Date of Service: 08/18/17   Patient Care Team: Ernestene Kiel, MD as PCP - General (Internal Medicine) [name], MD as Consulting Physician (Orthopedic Surgery)  CHIEF COMPLAINTS/PURPOSE OF CONSULTATION:  F/u for Burkitt's lymphoma  DIAGNOSIS  BUrkitts Lymphoma currently in remission  Stage IVB Burkitt's lymphoma with t(8;14) translocation diagnosed 12/23/2015. This appears to be involving the sacrum causing sacral and root involvement with possible neurogenic bladder. PET CT scan as noted above shows extensive involvement with lymphoma. CSF negative for involvement. Bone marrow biopsy negative for involvement by lymphoma.  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 PET CT scan as noted above shows extensive involvement with lymphoma. CSF negative for involvement. Bone marrow biopsy negative for involvement by lymphoma. PET/CT scan after 3 cycles of EPOCH-R  on 03/02/2016  showed significant interval metabolic response.  Patient has completed cycle 6 of EPOCH-R and tolerated it well with no prohibitive toxicities. Grade 1 fatigue.  He has had some episodes of fevers including a periodontal abscess,  non-neutropenic fevers - unclear etiology, recent Escherichia coli urinary tract infection x 2 and Clostridium difficile diarrhea.  PET CT scan done after completion of this planned chemotherapy shows no clear evidence of residual lymphoma . Some borderline FDG avidity in the left sacral ala ?pathologic fracture vs residual lymphoma  Patient was evaluated by radiation oncology - no RT recommended to sacral lesion.  CURRENT TREATMENT -Active surveillance  Previous treatment The patient has completed 6 cycles of EPOCH -R and IT methotrexate x 4 doses starting with cycle 3 for CNS prophylaxis   HISTORY OF  PRESENTING ILLNESS:  Please see my previous note for details  INTERVAL HISTORY:   Erik Vaughn is here for a follow-up of his Burkitts lymphoma. The patient's last visit with [place] was on 05/20/17. The pt reports that he is doing well overall and he has enjoyed working outside and staying active.   The pt reports noticing GI discomfort and abnormal bowel movements when he consumes dairy products. He notes that his lower back is not bothering him anymore, and that his foot pain is also resolving.   He notes that he is "back to 100%" and has no concerns at this time. He notes that he has been emotionally well after the passing of both of his parents, and notes that he was thankful for the time he had with them.   Lab results today (08/18/17) of CBC, and Reticulocytes is as follows: all values are WNL. LDH and CMP 08/18/17 are wnl  On review of systems, pt denies back pain, pain along the spine, abdominal pain, leg swelling, and any other symptoms.   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   He smoked about 2 packs a day for 40 years.  SURGICAL HISTORY:  #1 left submandibular salivary gland benign tumor excision #2 port placement - 12/31/2015  SOCIAL HISTORY: Social History   Socioeconomic History  . Marital status: Married    Spouse name: Not on file  . Number of children: Not on file  . Years of education: Not on file  . Highest education level: Not on file  Occupational History  . Occupation: Geneticist, molecular  Social Needs  . Financial resource strain:  Not on file  . Food insecurity:    Worry: Not on file    Inability: Not on file  . Transportation needs:    Medical: Not on file    Non-medical: Not on file  Tobacco Use  . Smoking status: Former Smoker    Packs/day: 2.00    Years: 16.00     Pack years: 32.00    Last attempt to quit: 12/22/2005    Years since quitting: 11.6  . Smokeless tobacco: Current User    Types: Chew  Substance and Sexual Activity  . Alcohol use: No  . Drug use: No  . Sexual activity: Never  Lifestyle  . Physical activity:    Days per week: Not on file    Minutes per session: Not on file  . Stress: Not on file  Relationships  . Social connections:    Talks on phone: Not on file    Gets together: Not on file    Attends religious service: Not on file    Active member of club or organization: Not on file    Attends meetings of clubs or organizations: Not on file    Relationship status: Not on file  . Intimate partner violence:    Fear of current or ex partner: Not on file    Emotionally abused: Not on file    Physically abused: Not on file    Forced sexual activity: Not on file  Other Topics Concern  . Not on file  Social History Narrative  . Not on file   smoked 2 packs of cigarettes per day for 40 years quit about 8-9 years ago. Denies significant alcohol use. 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  FAMILY HISTORY:  Sister had breast cancer at age 73 years. Unknown if she had genetic testing One maternal uncle had melanoma Second maternal uncle had pancreatic cancer under the age of 22yrPaternal uncle with prostate cancer Dad had prostate cancer at age 6586years   ALLERGIES:  has No Known Allergies.  MEDICATIONS:  Current Outpatient Medications  Medication Sig Dispense Refill  . amLODipine (NORVASC) 10 MG tablet Take 1 tablet (10 mg total) by mouth every evening. 30 tablet 0  . Ascorbic Acid (VITAMIN C) 100 MG tablet Take 100 mg by mouth daily.    .Erik Kitchenb complex vitamins tablet Take 1 tablet by mouth daily.    . clopidogrel (PLAVIX) 75 MG tablet Take 1 tablet (75  mg total) by mouth every evening.    .Erik Kitchenlisinopril (PRINIVIL,ZESTRIL) 20 MG tablet Take 20 mg by mouth every evening.     . rosuvastatin (CRESTOR) 5 MG tablet Take 5 mg by mouth daily.     No current facility-administered medications for this visit.     REVIEW OF SYSTEMS:    .10 Point review of Systems was done is negative except as noted above.   PHYSICAL EXAMINATION: ECOG PERFORMANCE STATUS: 1 - Symptomatic but completely ambulatory  . GENERAL:alert, in no acute distress and comfortable SKIN: no acute rashes, no significant lesions EYES: conjunctiva are pink and non-injected, sclera anicteric OROPHARYNX: MMM, no exudates, no oropharyngeal erythema or ulceration NECK: supple, no JVD LYMPH:  no palpable lymphadenopathy in the cervical, axillary or inguinal regions LUNGS: clear to auscultation b/l with normal respiratory effort HEART: regular rate &  rhythm ABDOMEN:  normoactive bowel sounds , non tender, not distended. Extremity: no pedal edema PSYCH: alert & oriented x 3 with fluent speech NEURO: no focal motor/sensory deficits   LABORATORY DATA:  I have reviewed the data as listed . CBC Latest Ref Rng & Units 08/18/2017 05/20/2017 02/16/2017  WBC 4.0 - 10.3 K/uL 6.5 6.1 5.9  Hemoglobin 13.0 - 17.1 g/dL - 13.0 13.1  Hematocrit 38.4 - 49.9 % 40.0 39.4 39.8  Platelets 140 - 400 K/uL 219 207 209   HGB 13.6  CBC    Component Value Date/Time   WBC 6.5 08/18/2017 1124   WBC 6.1 05/20/2017 1012   WBC 5.5 11/11/2016 0715   RBC 4.61 08/18/2017 1124   RBC 4.61 08/18/2017 1124   HGB 13.0 05/20/2017 1012   HCT 40.0 08/18/2017 1124   HCT 39.4 05/20/2017 1012   PLT 219 08/18/2017 1124   PLT 207 05/20/2017 1012   MCV 86.8 08/18/2017 1124   MCV 86.4 05/20/2017 1012   MCH 29.5 08/18/2017 1124   MCHC 34.0 08/18/2017 1124   RDW 14.1 08/18/2017 1124   RDW 13.7 05/20/2017 1012   LYMPHSABS 1.9 08/18/2017 1124   LYMPHSABS 1.4 05/20/2017 1012   MONOABS 0.5 08/18/2017 1124    MONOABS 0.6 05/20/2017 1012   EOSABS 0.2 08/18/2017 1124   EOSABS 0.2 05/20/2017 1012   BASOSABS 0.0 08/18/2017 1124   BASOSABS 0.0 05/20/2017 1012    ANC 100  CMP Latest Ref Rng & Units 08/18/2017 05/20/2017 02/16/2017  Glucose 70 - 140 mg/dL 101 106 105  BUN 7 - 26 mg/dL 11 12.9 11.8  Creatinine 0.70 - 1.30 mg/dL 0.79 0.9 0.8  Sodium 136 - 145 mmol/L 139 140 140  Potassium 3.5 - 5.1 mmol/L 4.0 4.1 4.2  Chloride 98 - 109 mmol/L 103 - -  CO2 22 - 29 mmol/L _0 Calcium 8.4 - 10.4 mg/dL 10.1 9.3 9.8  Total Protein 6.4 - 8.3 g/dL 7.2 6.8 7.1  Total Bilirubin 0.2 - 1.2 mg/dL 0.8 0.78 0.70  Alkaline Phos 40 - 150 U/L 79 86 92  AST 5 - 34 U/L _1 ALT 0 - 55 U/L _2 . Lab Results  Component Value Date   LDH 163 08/18/2017    RADIOGRAPHIC STUDIES: I have personally reviewed the radiological images as listed and agreed with the findings in the report.  .No results found.  ASSESSMENT & PLAN:   67 year old Caucasian male with  #1 Stage IVB Burkitt's lymphoma with t(8;14) translocation Patient has completed cycle 6 of EPOCH-R and CNS prophylaxis with IT MTX - tolerated it well with no prohibitive toxicities.  Patient was evaluated by radiation oncology and no ISRT was recommended. His LDH level appears to have been increased and he had a repeat PET/CT scan on 10/29/2015 that shows no overt evidence of lymphoma recurrence. PLAN Repeat LDH levels remain normalize today at 163 -Discussed pt labwork today; blood counts are all WNL, -No indication for additional treatment of his NHL at this time.  -No clinical or lab evidence of disease recurrence at this time. -Recommend pt use lactase before consuming milk products.   #2 s/p neutropenia post Rituxan delayed neutropenia- remains resolved. Resolved now [place] at .3.9k   #3  left sacral ala and S1 fracture. L5-S1 and S1-S2 left-sided nerve root compression -resolving symptoms.  #6 history of peripheral arterial  disease  #7 chemotherapy related neuropathy - nearly resolved.  #8 Hypertension, dyslipidemia, peripheral  arterial disease -Continue management as per primary care physician.  #9 Ex-smoker with 80-pack-year history of smoking.  PLAN:  -maintain active lifestyle. -He was reminded to get his Prevnar and PCV with PCP, which he says he will do.   RTC with Dr Irene Limbo in 3 months with labs  Orders Placed This Encounter  Procedures  . CBC & Diff and Retic    Standing Status:   Future    Standing Expiration Date:   08/19/2018  . CMP ([place] only)    Standing Status:   Future    Standing Expiration Date:   08/19/2018  . Lactate dehydrogenase    Standing Status:   Future    Standing Expiration Date:   08/19/2018   . The total time spent in the appointment was 20 minutes and more than 50% was on counseling and direct patient cares.     Sullivan Lone MD [place] AAHIVMS [facility] Hematology/Oncology Physician [place]  (Office):       [phone] (Work cell):  [phone] (Fax):           [phone]  This document serves as a record of services personally performed by Sullivan Lone, MD. It was created on his behalf by Baldwin Jamaica, a trained medical scribe. The creation of this record is based on the scribe's personal observations and the provider's statements to them.   .I have reviewed the above documentation for accuracy and completeness, and I agree with the above. Brunetta Genera MD MS

## 2017-08-18 ENCOUNTER — Inpatient Hospital Stay: Payer: PPO

## 2017-08-18 ENCOUNTER — Inpatient Hospital Stay: Payer: PPO | Attending: Hematology | Admitting: Hematology

## 2017-08-18 ENCOUNTER — Telehealth: Payer: Self-pay | Admitting: Hematology

## 2017-08-18 ENCOUNTER — Encounter: Payer: Self-pay | Admitting: Hematology

## 2017-08-18 VITALS — BP 144/72 | HR 75 | Temp 97.7°F | Resp 18 | Ht 72.0 in | Wt 207.5 lb

## 2017-08-18 DIAGNOSIS — E875 Hyperkalemia: Secondary | ICD-10-CM

## 2017-08-18 DIAGNOSIS — Z8572 Personal history of non-Hodgkin lymphomas: Secondary | ICD-10-CM | POA: Diagnosis not present

## 2017-08-18 DIAGNOSIS — I739 Peripheral vascular disease, unspecified: Secondary | ICD-10-CM

## 2017-08-18 DIAGNOSIS — E785 Hyperlipidemia, unspecified: Secondary | ICD-10-CM | POA: Insufficient documentation

## 2017-08-18 DIAGNOSIS — I10 Essential (primary) hypertension: Secondary | ICD-10-CM

## 2017-08-18 DIAGNOSIS — C8378 Burkitt lymphoma, lymph nodes of multiple sites: Secondary | ICD-10-CM

## 2017-08-18 LAB — COMPREHENSIVE METABOLIC PANEL
ALBUMIN: 4.3 g/dL (ref 3.5–5.0)
ALK PHOS: 79 U/L (ref 40–150)
ALT: 31 U/L (ref 0–55)
AST: 26 U/L (ref 5–34)
Anion gap: 10 (ref 3–11)
BILIRUBIN TOTAL: 0.8 mg/dL (ref 0.2–1.2)
BUN: 11 mg/dL (ref 7–26)
CO2: 26 mmol/L (ref 22–29)
CREATININE: 0.79 mg/dL (ref 0.70–1.30)
Calcium: 10.1 mg/dL (ref 8.4–10.4)
Chloride: 103 mmol/L (ref 98–109)
GFR calc Af Amer: >60 mL/min (ref >60)
GFR calc non Af Amer: >60 mL/min (ref >60)
GLUCOSE: 101 mg/dL (ref 70–140)
Potassium: 4 mmol/L (ref 3.5–5.1)
SODIUM: 139 mmol/L (ref 136–145)
Total Protein: 7.2 g/dL (ref 6.4–8.3)

## 2017-08-18 LAB — CBC WITH DIFFERENTIAL (CANCER CENTER ONLY)
Basophils Absolute: 0 10*3/uL (ref 0.0–0.1)
Basophils Relative: 0 %
EOS PCT: 2 %
Eosinophils Absolute: 0.2 10*3/uL (ref 0.0–0.5)
HCT: 40 % (ref 38.4–49.9)
HEMOGLOBIN: 13.6 g/dL (ref 13.0–17.1)
LYMPHS ABS: 1.9 10*3/uL (ref 0.9–3.3)
Lymphocytes Relative: 30 %
MCH: 29.5 pg (ref 27.2–33.4)
MCHC: 34 g/dL (ref 32.0–36.0)
MCV: 86.8 fL (ref 79.3–98.0)
MONO ABS: 0.5 10*3/uL (ref 0.1–0.9)
MONOS PCT: 8 %
Neutro Abs: 3.9 10*3/uL (ref 1.5–6.5)
Neutrophils Relative %: 60 %
PLATELETS: 219 10*3/uL (ref 140–400)
RBC: 4.61 MIL/uL (ref 4.20–5.82)
RDW: 14.1 % (ref 11.0–14.6)
WBC Count: 6.5 10*3/uL (ref 4.0–10.3)

## 2017-08-18 LAB — RETICULOCYTES
RBC.: 4.61 MIL/uL (ref 4.20–5.82)
RETIC CT PCT: 1.5 % (ref 0.8–1.8)
Retic Count, Absolute: 69.2 10*3/uL (ref 34.8–93.9)

## 2017-08-18 LAB — LACTATE DEHYDROGENASE: LDH: 163 U/L (ref 125–245)

## 2017-08-18 NOTE — Patient Instructions (Signed)
Thank you for choosing [facility] to provide your oncology and hematology care.  To afford each patient quality time with our providers, please arrive 30 minutes before your scheduled appointment time.  If you arrive late for your appointment, you may be asked to reschedule.  We strive to give you quality time with our providers, and arriving late affects you and other patients whose appointments are after yours.   If you are a no show for multiple scheduled visits, you may be dismissed from the clinic at the providers discretion.    Again, thank you for choosing [facility], our hope is that these requests will decrease the amount of time that you wait before being seen by our physicians.  ______________________________________________________________________  Should you have questions after your visit to the  Cancer Center, please contact our office at [phone] between the hours of 8:30 and 4:30 p.m.    Voicemails left after 4:30p.m will not be returned until the following business day.    For prescription refill requests, please have your pharmacy contact us directly.  Please also try to allow 48 hours for prescription requests.    Please contact the scheduling department for questions regarding scheduling.  For scheduling of procedures such as PET scans, CT scans, MRI, Ultrasound, etc please contact central scheduling at [phone].    Resources For Cancer Patients and Caregivers:   Oncolink.org:  A wonderful resource for patients and healthcare providers for information regarding your disease, ways to tract your treatment, what to expect, etc.     American Cancer Society:  800-227-2345  Can help patients locate various types of support and financial assistance  Cancer Care: 1-800-813-HOPE (4673) Provides financial assistance, online support groups, medication/co-pay assistance.    [place] DSS:  336-641-3447 Where to apply for food  stamps, Medicaid, and utility assistance  Medicare Rights Center: 800-333-4114 Helps people with Medicare understand their rights and benefits, navigate the Medicare system, and secure the quality healthcare they deserve  SCAT: 336-333-6589 [place] [employer]'s shared-ride transportation service for eligible riders who have a disability that prevents them from riding the fixed route bus.    For additional information on assistance programs please contact our social worker:   Grier Hock/Abigail Elmore:  [phone]            

## 2017-08-18 NOTE — Telephone Encounter (Signed)
Appts scheduled AVS printed per 3/21 los

## 2017-11-17 NOTE — Progress Notes (Signed)
[name]    HEMATOLOGY/ONCOLOGY CLINIC NOTE  Date of Service: 11/18/17   Patient Care Team: Ernestene Kiel, MD as PCP - General (Internal Medicine) [name], MD as Consulting Physician (Orthopedic Surgery)  CHIEF COMPLAINTS/PURPOSE OF CONSULTATION:  F/u for Burkitt's lymphoma  DIAGNOSIS  Burkitts Lymphoma currently in remission  Stage IVB Burkitt's lymphoma with t(8;14) translocation diagnosed 12/23/2015. This appears to be involving the sacrum causing sacral and root involvement with possible neurogenic bladder. PET CT scan as noted above shows extensive involvement with lymphoma. CSF negative for involvement. Bone marrow biopsy negative for involvement by lymphoma.  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 PET CT scan as noted above shows extensive involvement with lymphoma. CSF negative for involvement. Bone marrow biopsy negative for involvement by lymphoma. PET/CT scan after 3 cycles of EPOCH-R  on 03/02/2016  showed significant interval metabolic response.  Patient has completed cycle 6 of EPOCH-R and tolerated it well with no prohibitive toxicities. Grade 1 fatigue.  He has had some episodes of fevers including a periodontal abscess,  non-neutropenic fevers - unclear etiology, recent Escherichia coli urinary tract infection x 2 and Clostridium difficile diarrhea.  PET CT scan done after completion of this planned chemotherapy shows no clear evidence of residual lymphoma . Some borderline FDG avidity in the left sacral ala ?pathologic fracture vs residual lymphoma  Patient was evaluated by radiation oncology - no RT recommended to sacral lesion.  CURRENT TREATMENT -Active surveillance  Previous treatment The patient has completed 6 cycles of EPOCH -R and IT methotrexate x 4 doses starting with cycle 3 for CNS prophylaxis   HISTORY OF  PRESENTING ILLNESS:  Please see my previous note for details  INTERVAL HISTORY:   Erik Vaughn is here for a follow-up of his Burkitts lymphoma. The patient's last visit with [place] was on 08/18/17. The pt reports that he is doing well overall.   The pt reports that he is taking metamucil and he moves his bowels, with normal stools, once a day. He notes that he continues to be active, continues to regain strength, is eating well and reports good energy levels. He notes that his radiculopathy from L5 remains stable and is not too bothersome.   He does have occasional abdominal cramps, and notes that he doesn't drink enough water.   Lab results today (11/18/17) of CBC, CMP, and Reticulocytes is as follows: all values are WNL. LDH 11/18/17 is pending  On review of systems, pt reports good energy levels, occasional abdominal cramps, normal bowel movements, and denies leg swelling, urinary discomfort, fevers, chills, night sweats, sensations of full bladder, and any other symptoms.     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   He smoked about 2 packs a day for 40 years.  SURGICAL HISTORY:  #1 left submandibular salivary gland benign tumor excision #2 port placement - 12/31/2015  SOCIAL HISTORY: Social History   Socioeconomic History  . Marital status: Married    Spouse name: Not on file  . Number of children: Not on file  . Years of education: Not on file  . Highest education level: Not on file  Occupational History  . Occupation: Geneticist, molecular  Social Needs  . Financial resource strain: Not on file  . Food  insecurity:    Worry: Not on file    Inability: Not on file  . Transportation needs:    Medical: Not on file    Non-medical: Not on file  Tobacco Use  . Smoking status: Former Smoker     Packs/day: 2.00    Years: 16.00    Pack years: 32.00    Last attempt to quit: 12/22/2005    Years since quitting: 11.9  . Smokeless tobacco: Current User    Types: Chew  Substance and Sexual Activity  . Alcohol use: No  . Drug use: No  . Sexual activity: Never  Lifestyle  . Physical activity:    Days per week: Not on file    Minutes per session: Not on file  . Stress: Not on file  Relationships  . Social connections:    Talks on phone: Not on file    Gets together: Not on file    Attends religious service: Not on file    Active member of club or organization: Not on file    Attends meetings of clubs or organizations: Not on file    Relationship status: Not on file  . Intimate partner violence:    Fear of current or ex partner: Not on file    Emotionally abused: Not on file    Physically abused: Not on file    Forced sexual activity: Not on file  Other Topics Concern  . Not on file  Social History Narrative  . Not on file   smoked 2 packs of cigarettes per day for 40 years quit about 8-9 years ago. Denies significant alcohol use. 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  FAMILY HISTORY:  Sister had breast cancer at age 72 years. Unknown if she had genetic testing One maternal uncle had melanoma Second maternal uncle had pancreatic cancer under the age of 60yrPaternal uncle with prostate cancer Dad had prostate cancer at age 1935years   ALLERGIES:  has No Known Allergies.  MEDICATIONS:  Current Outpatient Medications  Medication Sig Dispense Refill  . amLODipine (NORVASC) 10 MG tablet Take 1 tablet (10 mg total) by mouth every evening. 30 tablet 0  . b complex vitamins tablet Take 1 tablet by mouth daily.    . clopidogrel (PLAVIX) 75 MG tablet Take 1 tablet (75 mg total) by mouth every evening.      .[name] Kitchenlisinopril (PRINIVIL,ZESTRIL) 20 MG tablet Take 20 mg by mouth every evening.     . rosuvastatin (CRESTOR) 5 MG tablet Take 5 mg by mouth daily.     No current facility-administered medications for this visit.     REVIEW OF SYSTEMS:    A 10+ POINT REVIEW OF SYSTEMS WAS OBTAINED including neurology, dermatology, psychiatry, cardiac, respiratory, lymph, extremities, GI, GU, Musculoskeletal, constitutional, breasts, reproductive, HEENT.  All pertinent positives are noted in the HPI.  All others are negative.   PHYSICAL EXAMINATION: ECOG PERFORMANCE STATUS: 1 - Symptomatic but completely ambulatory  GENERAL:alert, in no acute distress and comfortable SKIN: no acute rashes, no significant lesions EYES: conjunctiva are pink and non-injected, sclera anicteric OROPHARYNX: MMM, no exudates, no oropharyngeal erythema or ulceration NECK: supple, no JVD LYMPH:  no palpable lymphadenopathy in the cervical, axillary or inguinal regions LUNGS: clear to auscultation b/l with normal respiratory effort HEART: regular rate &  rhythm ABDOMEN:  normoactive bowel sounds , non tender, not distended. Extremity: no pedal edema PSYCH: alert & oriented x 3 with fluent speech NEURO: no focal motor/sensory deficits   LABORATORY DATA:  I have reviewed the data as listed . CBC Latest Ref Rng & Units 11/18/2017 08/18/2017 05/20/2017  WBC 4.0 - 10.3 K/uL 6.1 6.5 6.1  Hemoglobin 13.0 - 17.1 g/dL 13.6 13.6 13.0  Hematocrit 38.4 - 49.9 % 40.0 40.0 39.4  Platelets 140 - 400 K/uL 206 219 207   HGB 13.6  CBC    Component Value Date/Time   WBC 6.1 11/18/2017 1039   RBC 4.55 11/18/2017 1039   RBC 4.55 11/18/2017 1039   HGB 13.6 11/18/2017 1039   HGB 13.6 08/18/2017 1124   HGB 13.0 05/20/2017 1012   HCT 40.0 11/18/2017 1039   HCT 39.4 05/20/2017 1012   PLT 206 11/18/2017 1039   PLT 219 08/18/2017 1124   PLT 207 05/20/2017 1012   MCV 87.9 11/18/2017 1039   MCV 86.4 05/20/2017 1012   MCH 29.9 11/18/2017  1039   MCHC 34.0 11/18/2017 1039   RDW 13.2 11/18/2017 1039   RDW 13.7 05/20/2017 1012   LYMPHSABS 1.6 11/18/2017 1039   LYMPHSABS 1.4 05/20/2017 1012   MONOABS 0.8 11/18/2017 1039   MONOABS 0.6 05/20/2017 1012   EOSABS 0.1 11/18/2017 1039   EOSABS 0.2 05/20/2017 1012   BASOSABS 0.0 11/18/2017 1039   BASOSABS 0.0 05/20/2017 1012    ANC 100  CMP Latest Ref Rng & Units 11/18/2017 08/18/2017 05/20/2017  Glucose 70 - 140 mg/dL 108 101 106  BUN 7 - 26 mg/dL 12 11 12.9  Creatinine 0.70 - 1.30 mg/dL 0.88 0.79 0.9  Sodium 136 - 145 mmol/L 139 139 140  Potassium 3.5 - 5.1 mmol/L 4.5 4.0 4.1  Chloride 98 - 109 mmol/L 105 103 -  CO2 22 - 29 mmol/L 26 26 25   Calcium 8.4 - 10.4 mg/dL 10.0 10.1 9.3  Total Protein 6.4 - 8.3 g/dL 7.0 7.2 6.8  Total Bilirubin 0.2 - 1.2 mg/dL 0.7 0.8 0.78  Alkaline Phos 40 - 150 U/L 85 79 86  AST 5 - 34 U/L 27 26 23   ALT 0 - 55 U/L 29 31 25   . Lab Results  Component Value Date   LDH 151 11/18/2017    RADIOGRAPHIC STUDIES: I have personally reviewed the radiological images as listed and agreed with the findings in the report.  .No results found.  ASSESSMENT & PLAN:   67 y.o. 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  Patient was evaluated by radiation oncology and no ISRT was recommended. His LDH level appears to have been increased and he had a repeat PET/CT scan on 10/29/2015 that shows no overt evidence of lymphoma recurrence. PLAN -No clinical or lab evidence of disease recurrence at this time. -Discussed pt labwork today, 11/18/17; blood counts and chemistries are normal. LDH is pending -Optimize Vitamin D replacement and increase hydration for mild, occasional cramps -Discuss Crestor with PCP considering occasional muscle cramps -Pt completed chemotherapy in November 2017, and we will  continue 3 month follow ups until the 2 year mark, and space out to 6 months --The pt shows no clinical or lab progression/return of Burkitt's lymphoma at this time.  -No indication for further treatment at this time.    #2  s/p neutropenia post Rituxan delayed neutropenia- remains resolved. Resolved now [place] at .3.9k   #3  left sacral ala and S1 fracture. L5-S1 and S1-S2 left-sided nerve root compression -resolving symptoms.  #6 history of peripheral arterial disease  #7 chemotherapy related neuropathy - nearly resolved.  #8 Hypertension, dyslipidemia, peripheral arterial disease -Continue management as per primary care physician.  #9 Ex-smoker with 80-pack-year history of smoking.  PLAN:  -maintain active lifestyle. -He was reminded to get his Prevnar and PCV with PCP, which he says he will do.   RTC with Dr Irene Limbo with labs in 3 months   Orders Placed This Encounter  Procedures  . CBC with Differential/Platelet    Standing Status:   Future    Standing Expiration Date:   12/23/2018  . CMP ([place] only)    Standing Status:   Future    Standing Expiration Date:   11/19/2018  . Lactate dehydrogenase    Standing Status:   Future    Standing Expiration Date:   11/19/2018   The toal time spent in the appt was 15 minutes and more than 50% was on counseling and direct patient cares.     Erik Lone MD [place] AAHIVMS [facility] Hematology/Oncology Physician [facility]  (Office):       [phone] (Work cell):  [phone] (Fax):           [phone]  I, Erik Vaughn, am acting as a scribe for Dr Irene Limbo.   .I have reviewed the above documentation for accuracy and completeness, and I agree with the above. Erik Genera MD

## 2017-11-18 ENCOUNTER — Inpatient Hospital Stay (HOSPITAL_BASED_OUTPATIENT_CLINIC_OR_DEPARTMENT_OTHER): Payer: PPO | Admitting: Hematology

## 2017-11-18 ENCOUNTER — Inpatient Hospital Stay: Payer: PPO | Attending: Hematology

## 2017-11-18 ENCOUNTER — Telehealth: Payer: Self-pay | Admitting: Hematology

## 2017-11-18 ENCOUNTER — Encounter: Payer: Self-pay | Admitting: Hematology

## 2017-11-18 VITALS — BP 136/77 | HR 69 | Temp 98.0°F | Resp 17 | Ht 72.0 in | Wt 213.5 lb

## 2017-11-18 DIAGNOSIS — I10 Essential (primary) hypertension: Secondary | ICD-10-CM | POA: Insufficient documentation

## 2017-11-18 DIAGNOSIS — Z8572 Personal history of non-Hodgkin lymphomas: Secondary | ICD-10-CM | POA: Diagnosis not present

## 2017-11-18 DIAGNOSIS — C8378 Burkitt lymphoma, lymph nodes of multiple sites: Secondary | ICD-10-CM

## 2017-11-18 LAB — CMP (CANCER CENTER ONLY)
ALBUMIN: 4.3 g/dL (ref 3.5–5.0)
ALT: 29 U/L (ref 0–55)
AST: 27 U/L (ref 5–34)
Alkaline Phosphatase: 85 U/L (ref 40–150)
Anion gap: 8 (ref 3–11)
BUN: 12 mg/dL (ref 7–26)
CHLORIDE: 105 mmol/L (ref 98–109)
CO2: 26 mmol/L (ref 22–29)
Calcium: 10 mg/dL (ref 8.4–10.4)
Creatinine: 0.88 mg/dL (ref 0.70–1.30)
GFR, Est AFR Am: >60 mL/min (ref >60)
GFR, Estimated: >60 mL/min (ref >60)
GLUCOSE: 108 mg/dL (ref 70–140)
POTASSIUM: 4.5 mmol/L (ref 3.5–5.1)
SODIUM: 139 mmol/L (ref 136–145)
Total Bilirubin: 0.7 mg/dL (ref 0.2–1.2)
Total Protein: 7 g/dL (ref 6.4–8.3)

## 2017-11-18 LAB — CBC WITH DIFFERENTIAL/PLATELET
Basophils Absolute: 0 10*3/uL (ref 0.0–0.1)
Basophils Relative: 0 %
EOS PCT: 2 %
Eosinophils Absolute: 0.1 10*3/uL (ref 0.0–0.5)
HCT: 40 % (ref 38.4–49.9)
Hemoglobin: 13.6 g/dL (ref 13.0–17.1)
LYMPHS ABS: 1.6 10*3/uL (ref 0.9–3.3)
LYMPHS PCT: 27 %
MCH: 29.9 pg (ref 27.2–33.4)
MCHC: 34 g/dL (ref 32.0–36.0)
MCV: 87.9 fL (ref 79.3–98.0)
MONO ABS: 0.8 10*3/uL (ref 0.1–0.9)
MONOS PCT: 14 %
Neutro Abs: 3.5 10*3/uL (ref 1.5–6.5)
Neutrophils Relative %: 57 %
PLATELETS: 206 10*3/uL (ref 140–400)
RBC: 4.55 MIL/uL (ref 4.20–5.82)
RDW: 13.2 % (ref 11.0–14.6)
WBC: 6.1 10*3/uL (ref 4.0–10.3)

## 2017-11-18 LAB — RETICULOCYTES
RBC.: 4.55 MIL/uL (ref 4.20–5.82)
Retic Count, Absolute: 72.8 10*3/uL (ref 34.8–93.9)
Retic Ct Pct: 1.6 % (ref 0.8–1.8)

## 2017-11-18 LAB — LACTATE DEHYDROGENASE: LDH: 151 U/L (ref 125–245)

## 2017-11-18 NOTE — Telephone Encounter (Signed)
Appointments scheduled AVS/Calendar printed per 6/21 los

## 2018-01-13 ENCOUNTER — Other Ambulatory Visit: Payer: Self-pay | Admitting: Nurse Practitioner

## 2018-02-16 NOTE — Progress Notes (Signed)
[name]    HEMATOLOGY/ONCOLOGY CLINIC NOTE  Date of Service: 02/17/18      Patient Care Team: Ernestene Kiel, MD as PCP - General (Internal Medicine) [name], MD as Consulting Physician (Orthopedic Surgery)  CHIEF COMPLAINTS/PURPOSE OF CONSULTATION:  F/u for Burkitt's lymphoma  DIAGNOSIS  Burkitts Lymphoma currently in remission  Stage IVB Burkitt's lymphoma with t(8;14) translocation diagnosed 12/23/2015. This appears to be involving the sacrum causing sacral and root involvement with possible neurogenic bladder. PET CT scan as noted above shows extensive involvement with lymphoma. CSF negative for involvement. Bone marrow biopsy negative for involvement by lymphoma.  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 PET CT scan as noted above shows extensive involvement with lymphoma. CSF negative for involvement. Bone marrow biopsy negative for involvement by lymphoma. PET/CT scan after 3 cycles of EPOCH-R  on 03/02/2016  showed significant interval metabolic response.  Patient has completed cycle 6 of EPOCH-R and tolerated it well with no prohibitive toxicities. Grade 1 fatigue.  He has had some episodes of fevers including a periodontal abscess,  non-neutropenic fevers - unclear etiology, recent Escherichia coli urinary tract infection x 2 and Clostridium difficile diarrhea.  PET CT scan done after completion of this planned chemotherapy shows no clear evidence of residual lymphoma . Some borderline FDG avidity in the left sacral ala ?pathologic fracture vs residual lymphoma  Patient was evaluated by radiation oncology - no RT recommended to sacral lesion.  CURRENT TREATMENT -Active surveillance  Previous treatment The patient has completed 6 cycles of EPOCH -R and IT methotrexate x 4 doses starting with cycle 3 for CNS prophylaxis   HISTORY OF  PRESENTING ILLNESS:  Please see my previous note for details  INTERVAL HISTORY:   Mr. Earwood is here for a follow-up of his Burkitts lymphoma. The patient's last visit with [place] was on 11/18/17. The pt reports that he is doing well overall. The pt completed treatment in November 2017.   The pt reports that he has had no new concerns in the interim and he remains at his baseline. The pt denies any constitutional symptoms.   Lab results today (02/17/18) of CBC w/diff, CMP, and Reticulocytes is as follows: all values are WNL except for Glucose at 112. 02/17/18 LDH is WNL at 153  On review of systems, pt reports moving his bowels well, good energy levels, and denies fevers, chills, night sweats, unexpected weight loss, abdominal pains, mouth sores, noticing any new lumps or bumps, chest wall pain, and any other symptoms.   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   He smoked about 2 packs a day for 40 years.  SURGICAL HISTORY:  #1 left submandibular salivary gland benign tumor excision #2 port placement - 12/31/2015  SOCIAL HISTORY: Social History   Socioeconomic History  . Marital status: Married    Spouse name: Not on file  . Number of children: Not on file  . Years of education: Not on file  . Highest education level: Not on file  Occupational History  . Occupation: Geneticist, molecular  Social Needs  . Financial resource strain: Not on file  . Food insecurity:    Worry: Not on file    Inability: Not on file  . Transportation needs:  Medical: Not on file    Non-medical: Not on file  Tobacco Use  . Smoking status: Former Smoker    Packs/day: 2.00    Years: 16.00    Pack years: 32.00    Last attempt to quit: 12/22/2005    Years since quitting: 12.1  . Smokeless tobacco: Current User     Types: Chew  Substance and Sexual Activity  . Alcohol use: No  . Drug use: No  . Sexual activity: Never  Lifestyle  . Physical activity:    Days per week: Not on file    Minutes per session: Not on file  . Stress: Not on file  Relationships  . Social connections:    Talks on phone: Not on file    Gets together: Not on file    Attends religious service: Not on file    Active member of club or organization: Not on file    Attends meetings of clubs or organizations: Not on file    Relationship status: Not on file  . Intimate partner violence:    Fear of current or ex partner: Not on file    Emotionally abused: Not on file    Physically abused: Not on file    Forced sexual activity: Not on file  Other Topics Concern  . Not on file  Social History Narrative  . Not on file   smoked 2 packs of cigarettes per day for 40 years quit about 8-9 years ago. Denies significant alcohol use. 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  FAMILY HISTORY:  Sister had breast cancer at age 71 years. Unknown if she had genetic testing One maternal uncle had melanoma Second maternal uncle had pancreatic cancer under the age of 9yrPaternal uncle with prostate cancer Dad had prostate cancer at age 10615years   ALLERGIES:  has No Known Allergies.  MEDICATIONS:  Current Outpatient Medications  Medication Sig Dispense Refill  . amLODipine (NORVASC) 10 MG tablet Take 1 tablet (10 mg total) by mouth every evening. 30 tablet 0  . b complex vitamins tablet Take 1 tablet by mouth daily.    . clopidogrel (PLAVIX) 75 MG tablet Take 1 tablet (75 mg total) by mouth every evening.    .[name] Kitchenlisinopril (PRINIVIL,ZESTRIL) 20 MG tablet Take 20 mg by mouth every evening.     . rosuvastatin (CRESTOR) 5 MG tablet Take 5 mg by mouth daily.     No  current facility-administered medications for this visit.     REVIEW OF SYSTEMS:    A 10+ POINT REVIEW OF SYSTEMS WAS OBTAINED including neurology, dermatology, psychiatry, cardiac, respiratory, lymph, extremities, GI, GU, Musculoskeletal, constitutional, breasts, reproductive, HEENT.  All pertinent positives are noted in the HPI.  All others are negative.   PHYSICAL EXAMINATION: ECOG PERFORMANCE STATUS: 1 - Symptomatic but completely ambulatory  GENERAL:alert, in no acute distress and comfortable SKIN: no acute rashes, no significant lesions EYES: conjunctiva are pink and non-injected, sclera anicteric OROPHARYNX: MMM, no exudates, no oropharyngeal erythema or ulceration NECK: supple, no JVD LYMPH:  no palpable lymphadenopathy in the cervical, axillary or inguinal regions LUNGS: clear to auscultation b/l with normal respiratory effort HEART: regular rate & rhythm ABDOMEN:  normoactive bowel sounds , non tender, not distended. No palpable hepatosplenomegaly.  Extremity: no pedal edema PSYCH: alert &  oriented x 3 with fluent speech NEURO: no focal motor/sensory deficits   LABORATORY DATA:  I have reviewed the data as listed . CBC Latest Ref Rng & Units 02/17/2018 11/18/2017 08/18/2017  WBC 4.0 - 10.3 K/uL 5.9 6.1 6.5  Hemoglobin 13.0 - 17.1 g/dL 14.5 13.6 13.6  Hematocrit 38.4 - 49.9 % 43.1 40.0 40.0  Platelets 140 - 400 K/uL 207 206 219   HGB 13.6  CBC    Component Value Date/Time   WBC 5.9 02/17/2018 0935   RBC 4.96 02/17/2018 0935   HGB 14.5 02/17/2018 0935   HGB 13.6 08/18/2017 1124   HGB 13.0 05/20/2017 1012   HCT 43.1 02/17/2018 0935   HCT 39.4 05/20/2017 1012   PLT 207 02/17/2018 0935   PLT 219 08/18/2017 1124   PLT 207 05/20/2017 1012   MCV 86.9 02/17/2018 0935   MCV 86.4 05/20/2017 1012   MCH 29.2 02/17/2018 0935   MCHC 33.6 02/17/2018 0935   RDW 13.6 02/17/2018 0935   RDW 13.7 05/20/2017 1012   LYMPHSABS 1.6 02/17/2018 0935   LYMPHSABS 1.4 05/20/2017 1012     MONOABS 0.6 02/17/2018 0935   MONOABS 0.6 05/20/2017 1012   EOSABS 0.2 02/17/2018 0935   EOSABS 0.2 05/20/2017 1012   BASOSABS 0.0 02/17/2018 0935   BASOSABS 0.0 05/20/2017 1012    ANC 100  CMP Latest Ref Rng & Units 02/17/2018 11/18/2017 08/18/2017  Glucose 70 - 99 mg/dL 112(H) 108 101  BUN 8 - 23 mg/dL 9 12 11   Creatinine 0.61 - 1.24 mg/dL 0.81 0.88 0.79  Sodium 135 - 145 mmol/L 143 139 139  Potassium 3.5 - 5.1 mmol/L 4.3 4.5 4.0  Chloride 98 - 111 mmol/L 105 105 103  CO2 22 - 32 mmol/L 27 26 26   Calcium 8.9 - 10.3 mg/dL 10.0 10.0 10.1  Total Protein 6.5 - 8.1 g/dL 7.4 7.0 7.2  Total Bilirubin 0.3 - 1.2 mg/dL 0.9 0.7 0.8  Alkaline Phos 38 - 126 U/L 90 85 79  AST 15 - 41 U/L 28 27 26   ALT 0 - 44 U/L 32 29 31  . Lab Results  Component Value Date   LDH 153 02/17/2018    RADIOGRAPHIC STUDIES: I have personally reviewed the radiological images as listed and agreed with the findings in the report.  .No results found.  ASSESSMENT & PLAN:   67 y.o. 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  Patient was evaluated by radiation oncology and no ISRT was recommended. His LDH level appears to have been increased and he had a repeat PET/CT scan on 10/29/2015 that shows no overt evidence of lymphoma recurrence.  PLAN: -Discussed pt labwork today, 02/17/18; blood counts and chemistries are normal. LDH is WNL at 153.  -Advised that the pt receive pneumonia vaccinations, and will order Prevnar today -Pneumovax at next visit in 4 months  -Advised that the pt receive his annual flu shot, he notes that he will receive this with his PCP at least a week after Prevnar  -The pt shows no clinical or lab progression of Burkitt's lymphoma at this time.  -No indication for further treatment at this time.   -Will see the pt back in 4  months  -Optimize Vitamin D replacement and increase hydration for mild, occasional cramps -Discuss Crestor with PCP considering occasional muscle cramps -Pt completed  chemotherapy in November 2017, and we will continue 3-4 month follow ups until the 2 year mark, and space out to 6 months   #2 s/p neutropenia post Rituxan delayed neutropenia- remains resolved.  #3  left sacral ala and S1 fracture. L5-S1 and S1-S2 left-sided nerve root compression -resolving symptoms.  #4 history of peripheral arterial disease  #5 chemotherapy related neuropathy - nearly resolved.  #6 Hypertension, dyslipidemia, peripheral arterial disease -Continue management as per primary care physician.  #7 Ex-smoker with 80-pack-year history of smoking.  PLAN:  -maintain active lifestyle   Prevnar vaccine today Flu shot with PCP in 2 weeks Pneumovax on f/u in 4 months RTC with dr Irene Limbo with labs in 4 months    Orders Placed This Encounter  Procedures  . CBC with Differential/Platelet    Standing Status:   Future    Standing Expiration Date:   03/24/2019  . CMP ([place] only)    Standing Status:   Future    Standing Expiration Date:   02/18/2019  . Lactate dehydrogenase    Standing Status:   Future    Standing Expiration Date:   02/18/2019   The total time spent in the appt was 20 minutes and more than 50% was on counseling and direct patient cares.     Sullivan Lone MD MS AAHIVMS [facility] Hematology/Oncology Physician [facility]  (Office):       [phone] (Work cell):  [phone] (Fax):           [phone]  I, Baldwin [place], am acting as a scribe for Dr. Irene Limbo  .I have reviewed the above documentation for accuracy and completeness, and I agree with the above. Brunetta Genera MD

## 2018-02-17 ENCOUNTER — Inpatient Hospital Stay (HOSPITAL_BASED_OUTPATIENT_CLINIC_OR_DEPARTMENT_OTHER): Payer: PPO | Admitting: Hematology

## 2018-02-17 ENCOUNTER — Telehealth: Payer: Self-pay

## 2018-02-17 ENCOUNTER — Other Ambulatory Visit: Payer: Self-pay

## 2018-02-17 ENCOUNTER — Inpatient Hospital Stay: Payer: PPO | Attending: Hematology

## 2018-02-17 VITALS — BP 143/70 | HR 71 | Temp 97.8°F | Resp 18 | Ht 72.0 in | Wt 212.6 lb

## 2018-02-17 DIAGNOSIS — Z8572 Personal history of non-Hodgkin lymphomas: Secondary | ICD-10-CM

## 2018-02-17 DIAGNOSIS — C8378 Burkitt lymphoma, lymph nodes of multiple sites: Secondary | ICD-10-CM

## 2018-02-17 DIAGNOSIS — Z23 Encounter for immunization: Secondary | ICD-10-CM | POA: Diagnosis not present

## 2018-02-17 LAB — CMP (CANCER CENTER ONLY)
ALBUMIN: 4.5 g/dL (ref 3.5–5.0)
ALK PHOS: 90 U/L (ref 38–126)
ALT: 32 U/L (ref 0–44)
AST: 28 U/L (ref 15–41)
Anion gap: 11 (ref 5–15)
BILIRUBIN TOTAL: 0.9 mg/dL (ref 0.3–1.2)
BUN: 9 mg/dL (ref 8–23)
CALCIUM: 10 mg/dL (ref 8.9–10.3)
CO2: 27 mmol/L (ref 22–32)
Chloride: 105 mmol/L (ref 98–111)
Creatinine: 0.81 mg/dL (ref 0.61–1.24)
GFR, Est AFR Am: >60 mL/min (ref >60)
GFR, Estimated: >60 mL/min (ref >60)
Glucose, Bld: 112 mg/dL — ABNORMAL HIGH (ref 70–99)
POTASSIUM: 4.3 mmol/L (ref 3.5–5.1)
Sodium: 143 mmol/L (ref 135–145)
TOTAL PROTEIN: 7.4 g/dL (ref 6.5–8.1)

## 2018-02-17 LAB — CBC WITH DIFFERENTIAL/PLATELET
BASOS ABS: 0 10*3/uL (ref 0.0–0.1)
Basophils Relative: 1 %
EOS PCT: 3 %
Eosinophils Absolute: 0.2 10*3/uL (ref 0.0–0.5)
HCT: 43.1 % (ref 38.4–49.9)
Hemoglobin: 14.5 g/dL (ref 13.0–17.1)
LYMPHS PCT: 27 %
Lymphs Abs: 1.6 10*3/uL (ref 0.9–3.3)
MCH: 29.2 pg (ref 27.2–33.4)
MCHC: 33.6 g/dL (ref 32.0–36.0)
MCV: 86.9 fL (ref 79.3–98.0)
MONO ABS: 0.6 10*3/uL (ref 0.1–0.9)
Monocytes Relative: 9 %
Neutro Abs: 3.5 10*3/uL (ref 1.5–6.5)
Neutrophils Relative %: 60 %
Platelets: 207 10*3/uL (ref 140–400)
RBC: 4.96 MIL/uL (ref 4.20–5.82)
RDW: 13.6 % (ref 11.0–14.6)
WBC: 5.9 10*3/uL (ref 4.0–10.3)

## 2018-02-17 LAB — LACTATE DEHYDROGENASE: LDH: 153 U/L (ref 98–192)

## 2018-02-17 MED ORDER — PNEUMOCOCCAL 13-VAL CONJ VACC IM SUSP
0.5000 mL | Freq: Once | INTRAMUSCULAR | Status: DC
  Filled 2018-02-17: qty 0.5

## 2018-02-17 MED ORDER — PNEUMOCOCCAL 13-VAL CONJ VACC IM SUSP
0.5000 mL | Freq: Once | INTRAMUSCULAR | Status: AC
  Administered 2018-02-17: 0.5 mL via INTRAMUSCULAR
  Filled 2018-02-17: qty 0.5

## 2018-02-17 NOTE — Telephone Encounter (Signed)
Printed avs and calender of upcoming appointment. per 9/20 los

## 2018-03-03 ENCOUNTER — Ambulatory Visit (INDEPENDENT_AMBULATORY_CARE_PROVIDER_SITE_OTHER): Payer: PPO | Admitting: Cardiology

## 2018-03-03 ENCOUNTER — Encounter: Payer: Self-pay | Admitting: Cardiology

## 2018-03-03 VITALS — BP 140/62 | HR 74 | Ht 73.0 in | Wt 212.6 lb

## 2018-03-03 DIAGNOSIS — I739 Peripheral vascular disease, unspecified: Secondary | ICD-10-CM | POA: Diagnosis not present

## 2018-03-03 DIAGNOSIS — E785 Hyperlipidemia, unspecified: Secondary | ICD-10-CM

## 2018-03-03 DIAGNOSIS — I10 Essential (primary) hypertension: Secondary | ICD-10-CM

## 2018-03-03 DIAGNOSIS — C8378 Burkitt lymphoma, lymph nodes of multiple sites: Secondary | ICD-10-CM | POA: Diagnosis not present

## 2018-03-03 DIAGNOSIS — R011 Cardiac murmur, unspecified: Secondary | ICD-10-CM

## 2018-03-03 DIAGNOSIS — C8590 Non-Hodgkin lymphoma, unspecified, unspecified site: Secondary | ICD-10-CM

## 2018-03-03 NOTE — Addendum Note (Signed)
Addended by: Ashok Norris on: 03/03/2018 10:56 AM   Modules accepted: Orders

## 2018-03-03 NOTE — Progress Notes (Signed)
Cardiology Office Note:    Date:  03/03/2018   ID:  Erik Vaughn, DOB [birth year], MRN [id number]  PCP:  Ernestene Kiel, MD  Cardiologist:  Jenne Campus, MD    Referring MD: Ernestene Kiel, MD   Chief Complaint  Patient presents with  . Follow-up  Doing well but complained of having pain in my legs  History of Present Illness:    Erik Vaughn is a 67 y.o. male with peripheral vascular disease, also lymphoma dyslipidemia hypertension overall seems to be doing well but complained of pelvic pain the left leg while walking denies have any chest pain tightness squeezing pressure burning chest no shortness of breath.  He walks on the regular basis with his dog and he enjoyed but again does complain about any pain in the left leg.  Past Medical History:  Diagnosis Date  . Cancer ([name])    Burkett Lymphoma  . Hypertension   . PAD (peripheral artery disease) (HCC) left leg    Past Surgical History:  Procedure Laterality Date  . IR GENERIC HISTORICAL  12/31/2015   IR FLUORO GUIDE CV LINE RIGHT 12/31/2015 Arne Cleveland, MD [facility]  . IR GENERIC HISTORICAL  12/31/2015   IR US GUIDE VASC ACCESS RIGHT 12/31/2015 Arne Cleveland, MD [facility]  . IR GENERIC HISTORICAL  07/20/2016   IR REMOVAL TUN ACCESS W/ PORT W/O FL MOD SED 07/20/2016 Jacqulynn Cadet, MD [facility]  . left salivary Left    removed    Current Medications: Current Meds  Medication Sig  . amLODipine (NORVASC) 10 MG tablet Take 1 tablet (10 mg total) by mouth every evening.  [name] b complex vitamins tablet Take 1 tablet by mouth daily.  . Cholecalciferol (VITAMIN D) 2000 units tablet Take 1 tablet by mouth daily.  . clopidogrel (PLAVIX) 75 MG tablet Take 1 tablet (75 mg total) by mouth every evening.  [name] lisinopril (PRINIVIL,ZESTRIL) 20 MG tablet Take 20 mg by mouth every evening.   . rosuvastatin (CRESTOR) 5 MG tablet Take 5 mg by mouth daily.     Allergies:   Patient has no known allergies.   Social  History   Socioeconomic History  . Marital status: Married    Spouse name: Not on file  . Number of children: Not on file  . Years of education: Not on file  . Highest education level: Not on file  Occupational History  . Occupation: Geneticist, molecular  Social Needs  . Financial resource strain: Not on file  . Food insecurity:    Worry: Not on file    Inability: Not on file  . Transportation needs:    Medical: Not on file    Non-medical: Not on file  Tobacco Use  . Smoking status: Former Smoker    Packs/day: 2.00    Years: 16.00    Pack years: 32.00    Last attempt to quit: 12/22/2005    Years since quitting: 12.2  . Smokeless tobacco: Current User    Types: Chew  Substance and Sexual Activity  . Alcohol use: No  . Drug use: No  . Sexual activity: Never  Lifestyle  . Physical activity:    Days per week: Not on file    Minutes per session: Not on file  . Stress: Not on file  Relationships  . Social connections:    Talks on phone: Not on file    Gets together: Not on file    Attends religious service: Not on file  Active member of club or organization: Not on file    Attends meetings of clubs or organizations: Not on file    Relationship status: Not on file  Other Topics Concern  . Not on file  Social History Narrative  . Not on file     Family History: The patient's family history includes Alzheimer's disease (age of onset: 46) in his father. ROS:   Please see the history of present illness.    All 14 point review of systems negative except as described per history of present illness  EKGs/Labs/Other Studies Reviewed:    Normal sinus rhythm, normal P interval normal QS complex duration morphology no ST segment changes   Recent Labs: 02/17/2018: ALT 32; BUN 9; Creatinine 0.81; Hemoglobin 14.5; Platelets 207; Potassium 4.3; Sodium 143  Recent Lipid Panel No results found for: CHOL, TRIG, HDL, CHOLHDL, VLDL, LDLCALC, LDLDIRECT  Physical Exam:    VS:  BP  140/62   Pulse 74   Ht 6\' 1"  (1.854 m)   Wt 212 lb 9.6 oz (96.4 kg)   SpO2 96%   BMI 28.05 kg/m     Wt Readings from Last 3 Encounters:  03/03/18 212 lb 9.6 oz (96.4 kg)  02/17/18 212 lb 9.6 oz (96.4 kg)  11/18/17 213 lb 8 oz (96.8 kg)     GEN:  Well nourished, well developed in no acute distress HEENT: Normal NECK: No JVD; No carotid bruits LYMPHATICS: No lymphadenopathy CARDIAC: RRR, no murmurs, no rubs, no gallops RESPIRATORY:  Clear to auscultation without rales, wheezing or rhonchi  ABDOMEN: Soft, non-tender, non-distended MUSCULOSKELETAL:  No edema; No deformity  SKIN: Warm and dry LOWER EXTREMITIES: no swelling NEUROLOGIC:  Alert and oriented x 3 PSYCHIATRIC:  Normal affect   ASSESSMENT:    1. Peripheral vascular disease ([place])   2. Essential hypertension   3. 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 Claudication ([place])   5. Dyslipidemia   6. Lymphoma in remission ([place])    PLAN:    In order of problems listed above:  1. Peripheral vascular disease I will ask him to have carotid ultrasounds as well as arterial duplex of lower extremities to look for any significant stenosis.  He is on Plavix aspirin has been withdrawn when he started treatment for his lymphoma. 2. Essential hypertension blood pressure well controlled continue present management. 3. Burkitt lymphoma.  Apparently in remission follow-up on the regular basis with oncology team. 4. Dyslipidemia.  Small dose of Crestor will check his fasting lipid profile.   Medication Adjustments/Labs and Tests Ordered: Current medicines are reviewed at length with the patient today.  Concerns regarding medicines are outlined above.  No orders of the defined types were placed in this encounter.  Medication changes: No orders of the defined types were placed in this encounter.   Signed, Park Liter, MD, [facility] 03/03/2018 10:39 AM    [place]

## 2018-03-03 NOTE — Patient Instructions (Signed)
Medication Instructions:  Your physician recommends that you continue on your current medications as directed. Please refer to the Current Medication list given to you today.  If you need a refill on your cardiac medications before your next appointment, please call your pharmacy.   Lab work: None.  If you have labs (blood work) drawn today and your tests are completely normal, you will receive your results only by: [name] MyChart Message (if you have MyChart) OR . A paper copy in the mail If you have any lab test that is abnormal or we need to change your treatment, we will call you to review the results.  Testing/Procedures: Your physician has requested that you have a carotid duplex. This test is an ultrasound of the carotid arteries in your neck. It looks at blood flow through these arteries that supply the brain with blood. Allow one hour for this exam. There are no restrictions or special instructions.  Your physician has requested that you have a lower or upper extremity arterial duplex. This test is an ultrasound of the arteries in the legs or arms. It looks at arterial blood flow in the legs and arms. Allow one hour for Lower and Upper Arterial scans. There are no restrictions or special instructions   Follow-Up: At [facility], you and your health needs are our priority.  As part of our continuing mission to provide you with exceptional heart care, we have created designated Provider Care Teams.  These Care Teams include your primary Cardiologist (physician) and Advanced Practice Providers (APPs -  Physician Assistants and Nurse Practitioners) who all work together to provide you with the care you need, when you need it. You will need a follow up appointment in 5 months.  Please call our office 2 months in advance to schedule this appointment.  You may see No primary care provider on file. or another member of our [employer] Provider Team in [place]: Shirlee More, MD . Jyl Heinz, MD  Any Other Special Instructions Will Be Listed Below (If Applicable).

## 2018-04-11 ENCOUNTER — Other Ambulatory Visit: Payer: Self-pay

## 2018-04-11 DIAGNOSIS — I739 Peripheral vascular disease, unspecified: Secondary | ICD-10-CM

## 2018-04-11 DIAGNOSIS — C8378 Burkitt lymphoma, lymph nodes of multiple sites: Secondary | ICD-10-CM

## 2018-04-11 MED ORDER — ROSUVASTATIN CALCIUM 5 MG PO TABS: 5.0000 mg | ORAL_TABLET | Freq: Every day | ORAL | 1 refills | Status: DC

## 2018-04-11 MED ORDER — LISINOPRIL 20 MG PO TABS: 20.0000 mg | ORAL_TABLET | Freq: Every evening | ORAL | 1 refills | Status: DC

## 2018-04-11 MED ORDER — CLOPIDOGREL BISULFATE 75 MG PO TABS: 75.0000 mg | ORAL_TABLET | Freq: Every evening | ORAL | 1 refills | Status: DC

## 2018-04-11 MED ORDER — AMLODIPINE BESYLATE 10 MG PO TABS: 10.0000 mg | ORAL_TABLET | Freq: Every evening | ORAL | 1 refills | Status: DC

## 2018-04-25 ENCOUNTER — Ambulatory Visit (INDEPENDENT_AMBULATORY_CARE_PROVIDER_SITE_OTHER): Payer: PPO

## 2018-04-25 ENCOUNTER — Telehealth: Payer: Self-pay | Admitting: Cardiology

## 2018-04-25 DIAGNOSIS — I739 Peripheral vascular disease, unspecified: Secondary | ICD-10-CM

## 2018-04-25 DIAGNOSIS — E785 Hyperlipidemia, unspecified: Secondary | ICD-10-CM | POA: Diagnosis not present

## 2018-04-25 DIAGNOSIS — C8378 Burkitt lymphoma, lymph nodes of multiple sites: Secondary | ICD-10-CM

## 2018-04-25 MED ORDER — CLOPIDOGREL BISULFATE 75 MG PO TABS: 75.0000 mg | ORAL_TABLET | Freq: Every evening | ORAL | 1 refills | Status: DC

## 2018-04-25 MED ORDER — ROSUVASTATIN CALCIUM 5 MG PO TABS: 5.0000 mg | ORAL_TABLET | Freq: Every day | ORAL | 1 refills | Status: DC

## 2018-04-25 MED ORDER — AMLODIPINE BESYLATE 10 MG PO TABS: 10.0000 mg | ORAL_TABLET | Freq: Every evening | ORAL | 1 refills | Status: DC

## 2018-04-25 MED ORDER — LISINOPRIL 20 MG PO TABS: 20.0000 mg | ORAL_TABLET | Freq: Every evening | ORAL | 1 refills | Status: DC

## 2018-04-25 NOTE — Progress Notes (Signed)
Complete lower extremity arterial duplex exam has been performed. Severe arterial disease with monophasic flow was present bilaterally.  Jimmy Teyonna Plaisted RDCS, RVT

## 2018-04-25 NOTE — Telephone Encounter (Signed)
Please call in scripts for Plavix,Crestor and Lisinopril and Amlodipine, patient is down to 14 days and needs called to [employer].[name]

## 2018-04-25 NOTE — Progress Notes (Signed)
Complete carotid artery duplex exam has been performed. Left ICA is  40-59% stenosed, Bilateral ECA velocities were increased.  Jimmy Emoni Whitworth RDCS, RVT

## 2018-04-25 NOTE — Telephone Encounter (Signed)
Amlodipine, Lisinopril, Plavix, and Crestor refilled.

## 2018-04-26 ENCOUNTER — Other Ambulatory Visit: Payer: Self-pay | Admitting: *Deleted

## 2018-04-26 DIAGNOSIS — E785 Hyperlipidemia, unspecified: Secondary | ICD-10-CM | POA: Diagnosis not present

## 2018-04-26 LAB — LIPID PANEL
Chol/HDL Ratio: 3 ratio (ref 0.0–5.0)
Cholesterol, Total: 94 mg/dL — ABNORMAL LOW (ref 100–199)
HDL: 31 mg/dL — ABNORMAL LOW (ref >39)
LDL CALC: 40 mg/dL (ref 0–99)
Triglycerides: 117 mg/dL (ref 0–149)
VLDL Cholesterol Cal: 23 mg/dL (ref 5–40)

## 2018-05-15 ENCOUNTER — Encounter: Payer: Self-pay | Admitting: Cardiology

## 2018-05-15 ENCOUNTER — Ambulatory Visit (INDEPENDENT_AMBULATORY_CARE_PROVIDER_SITE_OTHER): Payer: PPO | Admitting: Cardiology

## 2018-05-15 VITALS — BP 144/68 | HR 67 | Wt 210.6 lb

## 2018-05-15 DIAGNOSIS — I739 Peripheral vascular disease, unspecified: Secondary | ICD-10-CM | POA: Diagnosis not present

## 2018-05-15 DIAGNOSIS — E785 Hyperlipidemia, unspecified: Secondary | ICD-10-CM

## 2018-05-15 DIAGNOSIS — C8378 Burkitt lymphoma, lymph nodes of multiple sites: Secondary | ICD-10-CM

## 2018-05-15 DIAGNOSIS — I10 Essential (primary) hypertension: Secondary | ICD-10-CM

## 2018-05-15 MED ORDER — ROSUVASTATIN CALCIUM 10 MG PO TABS: 10.0000 mg | ORAL_TABLET | Freq: Every day | ORAL | 1 refills | Status: DC

## 2018-05-15 NOTE — Patient Instructions (Signed)
Medication Instructions:  Your physician has recommended you make the following change in your medication:  INCREASE your Crestor to 10 mg daily  If you need a refill on your cardiac medications before your next appointment, please call your pharmacy.   Lab work: None ordered If you have labs (blood work) drawn today and your tests are completely normal, you will receive your results only by: [name] MyChart Message (if you have MyChart) OR . A paper copy in the mail If you have any lab test that is abnormal or we need to change your treatment, we will call you to review the results.  Testing/Procedures: None ordered  Follow-Up: At [facility], you and your health needs are our priority.  As part of our continuing mission to provide you with exceptional heart care, we have created designated Provider Care Teams.  These Care Teams include your primary Cardiologist (physician) and Advanced Practice Providers (APPs -  Physician Assistants and Nurse Practitioners) who all work together to provide you with the care you need, when you need it. You will need a follow up appointment in 3 months.  Please call our office 2 months in advance to schedule this appointment.  You may see  Jenne Campus or another member of our [employer] Provider Team in [place]: Shirlee More, MD . Jyl Heinz, MD  Any Other Special Instructions Will Be Listed Below (If Applicable).

## 2018-05-15 NOTE — Progress Notes (Signed)
Cardiology Office Note:    Date:  05/15/2018   ID:  Erik Vaughn, DOB [birth year], MRN [id number]  PCP:  Ernestene Kiel, MD  Cardiologist:  Jenne Campus, MD    Referring MD: Ernestene Kiel, MD   Chief Complaint  Patient presents with  . Follow up testing  Doing better  History of Present Illness:    Erik Vaughn is a 67 y.o. male with claudications, hypertension.  Comes to the office to discuss results of test.  Carotid ultrasounds was done which showed noncritical stenosis in the carotid arteries, he also got arterial duplex of lower extremities which shows some significant stenosis of the artery and left lower extremities.  We talked about options in this situation he said the leg is getting overall better he does walk with his dog on a regular basis and encouraged him to do that I told him that alternative to this would be angiogram with potential intervention he prefers to simply walk.  Past Medical History:  Diagnosis Date  . Cancer ([place])    Burkett Lymphoma  . Hypertension   . PAD (peripheral artery disease) (HCC) left leg    Past Surgical History:  Procedure Laterality Date  . IR GENERIC HISTORICAL  12/31/2015   IR FLUORO GUIDE CV LINE RIGHT 12/31/2015 Arne Cleveland, MD [facility]  . IR GENERIC HISTORICAL  12/31/2015   IR US GUIDE VASC ACCESS RIGHT 12/31/2015 Arne Cleveland, MD [facility]  . IR GENERIC HISTORICAL  07/20/2016   IR REMOVAL TUN ACCESS W/ PORT W/O FL MOD SED 07/20/2016 Jacqulynn Cadet, MD [facility]  . left salivary Left    removed    Current Medications: Current Meds  Medication Sig  . amLODipine (NORVASC) 10 MG tablet Take 1 tablet (10 mg total) by mouth every evening.  [name] b complex vitamins tablet Take 1 tablet by mouth daily.  . clopidogrel (PLAVIX) 75 MG tablet Take 1 tablet (75 mg total) by mouth every evening.  [name] lisinopril (PRINIVIL,ZESTRIL) 20 MG tablet Take 1 tablet (20 mg total) by mouth every evening.  . rosuvastatin  (CRESTOR) 5 MG tablet Take 1 tablet (5 mg total) by mouth daily.     Allergies:   Patient has no known allergies.   Social History   Socioeconomic History  . Marital status: Married    Spouse name: Not on file  . Number of children: Not on file  . Years of education: Not on file  . Highest education level: Not on file  Occupational History  . Occupation: Geneticist, molecular  Social Needs  . Financial resource strain: Not on file  . Food insecurity:    Worry: Not on file    Inability: Not on file  . Transportation needs:    Medical: Not on file    Non-medical: Not on file  Tobacco Use  . Smoking status: Former Smoker    Packs/day: 2.00    Years: 16.00    Pack years: 32.00    Last attempt to quit: 12/22/2005    Years since quitting: 12.4  . Smokeless tobacco: Current User    Types: Chew  Substance and Sexual Activity  . Alcohol use: No  . Drug use: No  . Sexual activity: Never  Lifestyle  . Physical activity:    Days per week: Not on file    Minutes per session: Not on file  . Stress: Not on file  Relationships  . Social connections:    Talks on phone: Not on  file    Gets together: Not on file    Attends religious service: Not on file    Active member of club or organization: Not on file    Attends meetings of clubs or organizations: Not on file    Relationship status: Not on file  Other Topics Concern  . Not on file  Social History Narrative  . Not on file     Family History: The patient's family history includes Alzheimer's disease (age of onset: 71) in his father. ROS:   Please see the history of present illness.    All 14 point review of systems negative except as described per history of present illness  EKGs/Labs/Other Studies Reviewed:      Recent Labs: 02/17/2018: ALT 32; BUN 9; Creatinine 0.81; Hemoglobin 14.5; Platelets 207; Potassium 4.3; Sodium 143  Recent Lipid Panel    Component Value Date/Time   CHOL 94 (L) 04/26/2018 0814   TRIG 117  04/26/2018 0814   HDL 31 (L) 04/26/2018 0814   CHOLHDL 3.0 04/26/2018 0814   LDLCALC 40 04/26/2018 0814    Physical Exam:    VS:  BP (!) 144/68   Pulse 67   Wt 210 lb 9.6 oz (95.5 kg)   SpO2 97%   BMI 27.79 kg/m     Wt Readings from Last 3 Encounters:  05/15/18 210 lb 9.6 oz (95.5 kg)  03/03/18 212 lb 9.6 oz (96.4 kg)  02/17/18 212 lb 9.6 oz (96.4 kg)     GEN:  Well nourished, well developed in no acute distress HEENT: Normal NECK: No JVD; No carotid bruits LYMPHATICS: No lymphadenopathy CARDIAC: RRR, no murmurs, no rubs, no gallops RESPIRATORY:  Clear to auscultation without rales, wheezing or rhonchi  ABDOMEN: Soft, non-tender, non-distended MUSCULOSKELETAL:  No edema; No deformity  SKIN: Warm and dry LOWER EXTREMITIES: no swelling NEUROLOGIC:  Alert and oriented x 3 PSYCHIATRIC:  Normal affect   ASSESSMENT:    1. Essential hypertension   2. Peripheral vascular disease (HCC)   3. 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 Claudication ([place])   5. Dyslipidemia    PLAN:    In order of problems listed above:  1. Essential hypertension blood pressure well controlled continue present management. 2. Peripheral vascular disease again options discussed he prefer medical therapy and conservative approach which we will do 3. Lymphoma stable. 4. Claudication improving. Dyslipidemia since he does have significant peripheral vascular disease I recommended increasing dose of Crestor from 5-10.    Medication Adjustments/Labs and Tests Ordered: Current medicines are reviewed at length with the patient today.  Concerns regarding medicines are outlined above.  No orders of the defined types were placed in this encounter.  Medication changes: No orders of the defined types were placed in this encounter.   Signed, Park Liter, MD, [facility] 05/15/2018 9:29 AM    [place]

## 2018-06-06 DIAGNOSIS — E785 Hyperlipidemia, unspecified: Secondary | ICD-10-CM | POA: Diagnosis not present

## 2018-06-06 DIAGNOSIS — Z1331 Encounter for screening for depression: Secondary | ICD-10-CM | POA: Diagnosis not present

## 2018-06-06 DIAGNOSIS — Z1159 Encounter for screening for other viral diseases: Secondary | ICD-10-CM | POA: Diagnosis not present

## 2018-06-06 DIAGNOSIS — Z6829 Body mass index (BMI) 29.0-29.9, adult: Secondary | ICD-10-CM | POA: Diagnosis not present

## 2018-06-06 DIAGNOSIS — Z0000 Encounter for general adult medical examination without abnormal findings: Secondary | ICD-10-CM | POA: Diagnosis not present

## 2018-06-06 DIAGNOSIS — Z1339 Encounter for screening examination for other mental health and behavioral disorders: Secondary | ICD-10-CM | POA: Diagnosis not present

## 2018-06-06 DIAGNOSIS — Z125 Encounter for screening for malignant neoplasm of prostate: Secondary | ICD-10-CM | POA: Diagnosis not present

## 2018-06-15 NOTE — Progress Notes (Signed)
[name]    HEMATOLOGY/ONCOLOGY CLINIC NOTE  Date of Service: 06/16/18      Patient Care Team: Ernestene Kiel, MD as PCP - General (Internal Medicine) [name], MD as Consulting Physician (Orthopedic Surgery)  CHIEF COMPLAINTS/PURPOSE OF CONSULTATION:  F/u for Burkitt's lymphoma  DIAGNOSIS  Burkitts Lymphoma currently in remission  Stage IVB Burkitt's lymphoma with t(8;14) translocation diagnosed 12/23/2015. This appears to be involving the sacrum causing sacral and root involvement with possible neurogenic bladder. PET CT scan as noted above shows extensive involvement with lymphoma. CSF negative for involvement. Bone marrow biopsy negative for involvement by lymphoma.  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 PET CT scan as noted above shows extensive involvement with lymphoma. CSF negative for involvement. Bone marrow biopsy negative for involvement by lymphoma. PET/CT scan after 3 cycles of EPOCH-R  on 03/02/2016  showed significant interval metabolic response.  Patient has completed cycle 6 of EPOCH-R and tolerated it well with no prohibitive toxicities. Grade 1 fatigue.  He has had some episodes of fevers including a periodontal abscess,  non-neutropenic fevers - unclear etiology, recent Escherichia coli urinary tract infection x 2 and Clostridium difficile diarrhea.  PET CT scan done after completion of this planned chemotherapy shows no clear evidence of residual lymphoma . Some borderline FDG avidity in the left sacral ala ?pathologic fracture vs residual lymphoma  Patient was evaluated by radiation oncology - no RT recommended to sacral lesion.  CURRENT TREATMENT -Active surveillance  Previous treatment The patient has completed 6 cycles of EPOCH -R and IT methotrexate x 4 doses starting with cycle 3 for CNS prophylaxis   HISTORY OF  PRESENTING ILLNESS:  Please see my previous note for details  INTERVAL HISTORY:   Erik Vaughn is here for a follow-up of his Burkitts lymphoma. The patient's last visit with [place] was on 02/17/18. The pt reports that he is doing well overall.   The pt reports that he has not developed any new concerns in the interim. He denies any new back pain, pain along the spine or worsened energy levels. He endorses good energy levels and is staying moderately active. The pt also denies abdominal pains and denies leg swelling. Overall, the pt notes that he is continuing to do al he wishes to do in life and [place]   Lab results today (06/16/18) of CBC w/diff is as follows: all values are WNL except for HGB at 12.6, HCT at 37.1.  On review of systems, pt reports good energy levels, staying active, eating well, and denies back pain, bladder issues, worsened feet pain, pain along the spine, abdominal pains, leg swelling, and any other symptoms.   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   He smoked about 2 packs a day for 40 years.  SURGICAL HISTORY:  #1 left submandibular salivary gland benign tumor excision #2 port placement - 12/31/2015  SOCIAL HISTORY: Social History   Socioeconomic History  . Marital status: Married    Spouse name: Not on file  . Number of children: Not on file  . Years of education: Not on file  . Highest education level: Not on file  Occupational History  . Occupation: Geneticist, molecular  Social Needs  .  Financial resource strain: Not on file  . Food insecurity:    Worry: Not on file    Inability: Not on file  . Transportation needs:    Medical: Not on file    Non-medical: Not on file  Tobacco Use  . Smoking status: Former Smoker    Packs/day: 2.00     Years: 16.00    Pack years: 32.00    Last attempt to quit: 12/22/2005    Years since quitting: 12.4  . Smokeless tobacco: Current User    Types: Chew  Substance and Sexual Activity  . Alcohol use: No  . Drug use: No  . Sexual activity: Never  Lifestyle  . Physical activity:    Days per week: Not on file    Minutes per session: Not on file  . Stress: Not on file  Relationships  . Social connections:    Talks on phone: Not on file    Gets together: Not on file    Attends religious service: Not on file    Active member of club or organization: Not on file    Attends meetings of clubs or organizations: Not on file    Relationship status: Not on file  . Intimate partner violence:    Fear of current or ex partner: Not on file    Emotionally abused: Not on file    Physically abused: Not on file    Forced sexual activity: Not on file  Other Topics Concern  . Not on file  Social History Narrative  . Not on file   smoked 2 packs of cigarettes per day for 40 years quit about 8-9 years ago. Denies significant alcohol use. 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  FAMILY HISTORY:  Sister had breast cancer at age 25 years. Unknown if she had genetic testing One maternal uncle had melanoma Second maternal uncle had pancreatic cancer under the age of 42yrPaternal uncle with prostate cancer Dad had prostate cancer at age 7988years   ALLERGIES:  has No Known Allergies.  MEDICATIONS:  Current Outpatient Medications  Medication Sig Dispense Refill  . amLODipine (NORVASC) 10 MG tablet Take 1 tablet (10 mg total) by mouth every evening. 90 tablet 1  . b complex vitamins tablet Take 1 tablet by mouth daily.    . clopidogrel (PLAVIX) 75 MG tablet Take 1 tablet (75 mg total) by mouth every evening. 90 tablet 1  .  lisinopril (PRINIVIL,ZESTRIL) 20 MG tablet Take 1 tablet (20 mg total) by mouth every evening. 90 tablet 1  . rosuvastatin (CRESTOR) 10 MG tablet Take 1 tablet (10 mg total) by mouth daily. 90 tablet 1   No current facility-administered medications for this visit.     REVIEW OF SYSTEMS:    A 10+ POINT REVIEW OF SYSTEMS WAS OBTAINED including neurology, dermatology, psychiatry, cardiac, respiratory, lymph, extremities, GI, GU, Musculoskeletal, constitutional, breasts, reproductive, HEENT.  All pertinent positives are noted in the HPI.  All others are negative.   PHYSICAL EXAMINATION: ECOG PERFORMANCE STATUS: 1 - Symptomatic but completely ambulatory  GENERAL:alert, in no acute distress and comfortable SKIN: no acute rashes, no significant lesions EYES: conjunctiva are pink and non-injected, sclera anicteric OROPHARYNX: MMM, no exudates, no oropharyngeal erythema or ulceration NECK: supple, no JVD LYMPH:  no palpable lymphadenopathy in the cervical,  axillary or inguinal regions LUNGS: clear to auscultation b/l with normal respiratory effort HEART: regular rate & rhythm ABDOMEN:  normoactive bowel sounds , non tender, not distended. No palpable hepatosplenomegaly.  Extremity: no pedal edema PSYCH: alert & oriented x 3 with fluent speech NEURO: no focal motor/sensory deficits   LABORATORY DATA:  I have reviewed the data as listed . CBC Latest Ref Rng & Units 06/16/2018 02/17/2018 11/18/2017  WBC 4.0 - 10.5 K/uL 5.4 5.9 6.1  Hemoglobin 13.0 - 17.0 g/dL 12.6(L) 14.5 13.6  Hematocrit 39.0 - 52.0 % 37.1(L) 43.1 40.0  Platelets 150 - 400 K/uL 197 207 206   HGB 13.6  CBC    Component Value Date/Time   WBC 5.4 06/16/2018 1005   RBC 4.25 06/16/2018 1005   HGB 12.6 (L) 06/16/2018 1005   HGB 13.6 08/18/2017 1124   HGB 13.0 05/20/2017 1012   HCT 37.1 (L) 06/16/2018 1005   HCT 39.4 05/20/2017 1012   PLT 197 06/16/2018 1005   PLT 219 08/18/2017 1124   PLT 207 05/20/2017 1012   MCV 87.3  06/16/2018 1005   MCV 86.4 05/20/2017 1012   MCH 29.6 06/16/2018 1005   MCHC 34.0 06/16/2018 1005   RDW 12.9 06/16/2018 1005   RDW 13.7 05/20/2017 1012   LYMPHSABS 1.4 06/16/2018 1005   LYMPHSABS 1.4 05/20/2017 1012   MONOABS 0.4 06/16/2018 1005   MONOABS 0.6 05/20/2017 1012   EOSABS 0.1 06/16/2018 1005   EOSABS 0.2 05/20/2017 1012   BASOSABS 0.0 06/16/2018 1005   BASOSABS 0.0 05/20/2017 1012    ANC 100  CMP Latest Ref Rng & Units 06/16/2018 02/17/2018 11/18/2017  Glucose 70 - 99 mg/dL 204(H) 112(H) 108  BUN 8 - 23 mg/dL _0 Creatinine 0.61 - 1.24 mg/dL 0.83 0.81 0.88  Sodium 135 - 145 mmol/L 140 143 139  Potassium 3.5 - 5.1 mmol/L 3.6 4.3 4.5  Chloride 98 - 111 mmol/L 108 105 105  CO2 22 - 32 mmol/L _1 Calcium 8.9 - 10.3 mg/dL 8.8(L) 10.0 10.0  Total Protein 6.5 - 8.1 g/dL 6.4(L) 7.4 7.0  Total Bilirubin 0.3 - 1.2 mg/dL 0.7 0.9 0.7  Alkaline Phos 38 - 126 U/L 75 90 85  AST 15 - 41 U/L _2 ALT 0 - 44 U/L 28 32 29  . Lab Results  Component Value Date   LDH 145 06/16/2018    RADIOGRAPHIC STUDIES: I have personally reviewed the radiological images as listed and agreed with the findings in the report.  .No results found.  ASSESSMENT & PLAN:   68 y.o. 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  Patient was evaluated by radiation oncology and no ISRT was recommended. His LDH level appears to have been increased and he had a repeat PET/CT scan on 10/29/2015 that shows no overt evidence of lymphoma recurrence.  PLAN: -Discussed pt labwork today, 06/16/18; blood counts are stable, LDH WNL -Will give pt Pneumovax in clinic today on 06/16/18 -Pt prefers to obtain flu vaccine at his pharmacy, and will bring certification of this at next visit -The pt shows no clinical or lab progression/return of  Burkitt's lymphoma at this time.  -No indication for further treatment at this time. -Pt received Prevnar on 02/17/18 -Optimize Vitamin D replacement and increase hydration for mild, occasional cramps -Pt  completed chemotherapy in November 2017 -Will see the pt back in 6 months, sooner if any new concerns    #2 s/p neutropenia post Rituxan delayed neutropenia- remains resolved.  #3  left sacral ala and S1 fracture. L5-S1 and S1-S2 left-sided nerve root compression -resolving symptoms.  #4 history of peripheral arterial disease  #5 chemotherapy related neuropathy - nearly resolved.  #6 Hypertension, dyslipidemia, peripheral arterial disease -Continue management as per primary care physician.  #7 Ex-smoker with 80-pack-year history of smoking.  PLAN:  -maintain active lifestyle   RTC with dr Irene Limbo with labs in 6 months   No orders of the defined types were placed in this encounter.  The total time spent in the appt was 20 minutes and more than 50% was on counseling and direct patient cares.     Sullivan Lone MD [place] AAHIVMS [facility] Hematology/Oncology Physician [facility]  (Office):       [phone] (Work cell):  [phone] (Fax):           [phone]  I, Baldwin [place], am acting as a scribe for Dr. Sullivan Lone.   .I have reviewed the above documentation for accuracy and completeness, and I agree with the above. Brunetta Genera MD

## 2018-06-16 ENCOUNTER — Telehealth: Payer: Self-pay | Admitting: Hematology

## 2018-06-16 ENCOUNTER — Inpatient Hospital Stay (HOSPITAL_BASED_OUTPATIENT_CLINIC_OR_DEPARTMENT_OTHER): Payer: PPO | Admitting: Hematology

## 2018-06-16 ENCOUNTER — Inpatient Hospital Stay: Payer: PPO

## 2018-06-16 ENCOUNTER — Inpatient Hospital Stay: Payer: PPO | Attending: Hematology

## 2018-06-16 VITALS — BP 146/69 | HR 68 | Temp 98.2°F | Resp 20 | Ht 73.0 in | Wt 209.5 lb

## 2018-06-16 DIAGNOSIS — C8378 Burkitt lymphoma, lymph nodes of multiple sites: Secondary | ICD-10-CM

## 2018-06-16 DIAGNOSIS — I10 Essential (primary) hypertension: Secondary | ICD-10-CM

## 2018-06-16 DIAGNOSIS — Z8572 Personal history of non-Hodgkin lymphomas: Secondary | ICD-10-CM | POA: Diagnosis not present

## 2018-06-16 DIAGNOSIS — Z23 Encounter for immunization: Secondary | ICD-10-CM | POA: Diagnosis not present

## 2018-06-16 LAB — CBC WITH DIFFERENTIAL/PLATELET
Abs Immature Granulocytes: 0.01 10*3/uL (ref 0.00–0.07)
Basophils Absolute: 0 10*3/uL (ref 0.0–0.1)
Basophils Relative: 1 %
Eosinophils Absolute: 0.1 10*3/uL (ref 0.0–0.5)
Eosinophils Relative: 2 %
HCT: 37.1 % — ABNORMAL LOW (ref 39.0–52.0)
Hemoglobin: 12.6 g/dL — ABNORMAL LOW (ref 13.0–17.0)
Immature Granulocytes: 0 %
Lymphocytes Relative: 25 %
Lymphs Abs: 1.4 10*3/uL (ref 0.7–4.0)
MCH: 29.6 pg (ref 26.0–34.0)
MCHC: 34 g/dL (ref 30.0–36.0)
MCV: 87.3 fL (ref 80.0–100.0)
MONOS PCT: 7 %
Monocytes Absolute: 0.4 10*3/uL (ref 0.1–1.0)
Neutro Abs: 3.5 10*3/uL (ref 1.7–7.7)
Neutrophils Relative %: 65 %
Platelets: 197 10*3/uL (ref 150–400)
RBC: 4.25 MIL/uL (ref 4.22–5.81)
RDW: 12.9 % (ref 11.5–15.5)
WBC: 5.4 10*3/uL (ref 4.0–10.5)
nRBC: 0 % (ref 0.0–0.2)

## 2018-06-16 LAB — CMP (CANCER CENTER ONLY)
ALK PHOS: 75 U/L (ref 38–126)
ALT: 28 U/L (ref 0–44)
AST: 26 U/L (ref 15–41)
Albumin: 4 g/dL (ref 3.5–5.0)
Anion gap: 9 (ref 5–15)
BILIRUBIN TOTAL: 0.7 mg/dL (ref 0.3–1.2)
BUN: 11 mg/dL (ref 8–23)
CO2: 23 mmol/L (ref 22–32)
Calcium: 8.8 mg/dL — ABNORMAL LOW (ref 8.9–10.3)
Chloride: 108 mmol/L (ref 98–111)
Creatinine: 0.83 mg/dL (ref 0.61–1.24)
GFR, Est AFR Am: >60 mL/min (ref >60)
GFR, Estimated: >60 mL/min (ref >60)
Glucose, Bld: 204 mg/dL — ABNORMAL HIGH (ref 70–99)
Potassium: 3.6 mmol/L (ref 3.5–5.1)
Sodium: 140 mmol/L (ref 135–145)
Total Protein: 6.4 g/dL — ABNORMAL LOW (ref 6.5–8.1)

## 2018-06-16 LAB — LACTATE DEHYDROGENASE: LDH: 145 U/L (ref 98–192)

## 2018-06-16 MED ORDER — PNEUMOCOCCAL VAC POLYVALENT 25 MCG/0.5ML IJ INJ
0.5000 mL | INJECTION | Freq: Once | INTRAMUSCULAR | Status: AC
  Administered 2018-06-16: 0.5 mL via INTRAMUSCULAR
  Filled 2018-06-16: qty 0.5

## 2018-06-16 NOTE — Patient Instructions (Signed)
Pneumococcal Vaccine, Polyvalent solution for injection Qu es este medicamento? La [employer] ANTINEUMOCCICA POLIVALENTE es una vacuna que ayuda a prevenir la infeccin causada por cepas de neumococos. Estas bacterias son la causa principal de las infecciones del odo, infecciones de la garganta por estreptococos y neumona, meningitis o infecciones sanguneas graves en todo el mundo. Estas vacunas ayudan al cuerpo a producir anticuerpos (sustancias protectoras) que le ayudan a combatir estas bacterias. La vacuna se recomienda para las personas de 2 aos de edad o mayor con problemas de [employer]	[place]. Se recomienda tambin para todos los adultos mayores de 13 aos de [place]. Esta vacuna no sirve para tratar a una infeccin. Este medicamento puede ser utilizado para otros usos; si tiene alguna pregunta consulte con su proveedor de atencin mdica o con su farmacutico. MARCAS COMUNES: Pneumovax 23 Qu le debo informar a mi profesional de la salud antes de tomar este medicamento? Necesita saber si usted presenta alguno de los [employer] o situaciones: -problemas de sangrado -transplante de mdula sea u de rganos -cncer, enfermedad de Hodgkin -fiebre -infeccin -problemas del sistema inmunolgico -baja cantidad de plaquetas en la sangre -convulsiones -una reaccin alrgica o inusual a la vacuna antineumoccica, a la difteria toxoid, a otras vacunas, al ltex, a otros medicamentos, alimentos, colorantes o conservantes -si est embarazada o buscando quedar embarazada -si est amamantando a un beb Cmo debo utilizar este medicamento? Esta vacuna se administra mediante inyeccin por va intramuscular o subcutnea. Lo administra un profesional de [employer]	[place]. Recibir una copia de informacin escrita sobre la vacuna antes de cada vacuna. Asegrese de leer este folleto cada vez cuidadosamente. Este folleto puede cambiar con frecuencia. Hable con su pediatra para informarse acerca del uso de este  medicamento en nios. Aunque este medicamento ha sido recetado a nios tan menores como de 2 aos de edad para condiciones selectivas, las precauciones se aplican. Sobredosis: Pngase en contacto inmediatamente con un centro toxicolgico o una sala de urgencia si usted cree que haya tomado demasiado medicamento. ATENCIN: [employer] es solo para usted. No comparta este medicamento con nadie. Qu sucede si me olvido de una dosis? Es importante no olvidar ninguna dosis. Informe a su mdico o a su profesional de la salud si no puede asistir a Photographer. Qu puede interactuar con este medicamento? -medicamentos para la quimioterapia contra el cncer -medicamentos que suprimen su funcin inmunolgica -medicamentos que tratan o previenen cogulos sanguneos, tales como warfarina, enoxaparina y dalteparina -medicamentos esteroideos, como la prednisona o la cortisona Puede ser que esta lista no menciona todas las posibles interacciones. Informe a su profesional de [employer]	[place] de [employer] productos a base de hierbas, medicamentos de [place] o suplementos nutritivos que est tomando. Si usted fuma, consume bebidas alcohlicas o si utiliza drogas ilegales, indqueselo tambin a su profesional de [employer]	[place]. Algunas sustancias pueden interactuar con su medicamento. A qu debo estar atento al usar [employer]? La fiebre leve y Conservation officer, historic buildings deben desaparecer despus de 3 das o menos. Informe a su mdico o su profesional de la salud si se presentan sntomas inusuales. Qu efectos secundarios puedo tener al [employer] este medicamento? Efectos secundarios que debe informar a su mdico o a Barrister's clerk de la salud tan pronto como sea posible: -reacciones alrgicas como erupcin cutnea, picazn o urticarias, hinchazn de la cara, labios o lengua -problemas respiratorios -confusin -fiebre ms de 102 grados F -dolor, hormigueo, entumecimiento [employer] o pies -convulsiones -sangrado, magulladuras  inusuales -cansancio o debilidad inusual Efectos  secundarios que, por lo general, no requieren atencin mdica (debe informarlos a su mdico o a su profesional de la salud si persisten o si son molestos): -molestias o dolores -diarrea -fiebre de 60 grados F o menos -dolor de cabeza -irritablidad -prdida del apetito -dolor, sensibildad en el lugar de la inyeccin -dificultad para conciliar el sueo Puede ser que esta lista no menciona todos los posibles efectos secundarios. Comunquese a su mdico por asesoramiento mdico [employer]. Usted puede informar los efectos secundarios a la FDA por telfono al 1-800-FDA-1088. Dnde debo guardar mi medicina? No se aplica en este caso. Esta vacuna se administrar en una clnica, la oficina de su mdico o en el consultorio de un profesional de la salud y no necesitar guardarlo en su domicilio. ATENCIN: Este folleto es un resumen. Puede ser que no cubra toda la posible informacin. Si usted tiene preguntas acerca de esta medicina, consulte con su mdico, su farmacutico o su profesional de Technical sales engineer.  2019 Elsevier/Gold Standard (2014-07-10 00:00:00)

## 2018-06-16 NOTE — Telephone Encounter (Signed)
Printed calendar and avs. °

## 2018-07-18 DIAGNOSIS — Z1211 Encounter for screening for malignant neoplasm of colon: Secondary | ICD-10-CM | POA: Diagnosis not present

## 2018-08-22 ENCOUNTER — Ambulatory Visit: Payer: PPO | Admitting: Cardiology

## 2018-10-09 DIAGNOSIS — R319 Hematuria, unspecified: Secondary | ICD-10-CM | POA: Diagnosis not present

## 2018-10-09 DIAGNOSIS — Z6829 Body mass index (BMI) 29.0-29.9, adult: Secondary | ICD-10-CM | POA: Diagnosis not present

## 2018-10-09 DIAGNOSIS — N401 Enlarged prostate with lower urinary tract symptoms: Secondary | ICD-10-CM | POA: Diagnosis not present

## 2018-10-09 DIAGNOSIS — R310 Gross hematuria: Secondary | ICD-10-CM | POA: Diagnosis not present

## 2018-10-09 DIAGNOSIS — Z8042 Family history of malignant neoplasm of prostate: Secondary | ICD-10-CM | POA: Diagnosis not present

## 2018-10-09 DIAGNOSIS — N390 Urinary tract infection, site not specified: Secondary | ICD-10-CM | POA: Diagnosis not present

## 2018-10-12 DIAGNOSIS — R319 Hematuria, unspecified: Secondary | ICD-10-CM | POA: Diagnosis not present

## 2018-10-12 DIAGNOSIS — R310 Gross hematuria: Secondary | ICD-10-CM | POA: Diagnosis not present

## 2018-10-16 ENCOUNTER — Other Ambulatory Visit: Payer: Self-pay

## 2018-10-16 MED ORDER — ROSUVASTATIN CALCIUM 10 MG PO TABS: 10.0000 mg | ORAL_TABLET | Freq: Every day | ORAL | 1 refills | Status: DC

## 2018-10-18 ENCOUNTER — Telehealth: Payer: Self-pay | Admitting: Cardiology

## 2018-10-18 NOTE — Telephone Encounter (Signed)
Virtual Visit Pre-Appointment Phone Call  "(Name), I am calling you today to discuss your upcoming appointment. We are currently trying to limit exposure to the virus that causes COVID-19 by seeing patients at home rather than in the office."  1. "What is the BEST phone number to call the day of the visit?" - include this in appointment notes  2. Do you have or have access to (through a family member/friend) a smartphone with video capability that we can use for your visit?" a. If yes - list this number in appt notes as cell (if different from BEST phone #) and list the appointment type as a VIDEO visit in appointment notes b. If no - list the appointment type as a PHONE visit in appointment notes  3. Confirm consent - "In the setting of the current Covid19 crisis, you are scheduled for a (phone or video) visit with your provider on (date) at (time).  Just as we do with many in-office visits, in order for you to participate in this visit, we must obtain consent.  If you'd like, I can send this to your mychart (if signed up) or email for you to review.  Otherwise, I can obtain your verbal consent now.  All virtual visits are billed to your insurance company just like a normal visit would be.  By agreeing to a virtual visit, we'd like you to understand that the technology does not allow for your provider to perform an examination, and thus may limit your provider's ability to fully assess your condition. If your provider identifies any concerns that need to be evaluated in person, we will make arrangements to do so.  Finally, though the technology is pretty good, we cannot assure that it will always work on either your or our end, and in the setting of a video visit, we may have to convert it to a phone-only visit.  In either situation, we cannot ensure that we have a secure connection.  Are you willing to proceed?" STAFF: Did the patient verbally acknowledge consent to telehealth visit? Document  YES/NO here: YES  4. Advise patient to be prepared - "Two hours prior to your appointment, go ahead and check your blood pressure, pulse, oxygen saturation, and your weight (if you have the equipment to check those) and write them all down. When your visit starts, your provider will ask you for this information. If you have an Apple Watch or Kardia device, please plan to have heart rate information ready on the day of your appointment. Please have a pen and paper handy nearby the day of the visit as well."  5. Give patient instructions for MyChart download to smartphone OR Doximity/Doxy.me as below if video visit (depending on what platform provider is using)  6. Inform patient they will receive a phone call 15 minutes prior to their appointment time (may be from unknown caller ID) so they should be prepared to answer    TELEPHONE CALL NOTE  Erik Vaughn has been deemed a candidate for a follow-up tele-health visit to limit community exposure during the Covid-19 pandemic. I spoke with the patient via phone to ensure availability of phone/video source, confirm preferred email & phone number, and discuss instructions and expectations.  I reminded Erik Vaughn to be prepared with any vital sign and/or heart rhythm information that could potentially be obtained via home monitoring, at the time of his visit. I reminded Erik Vaughn to expect a phone call prior to  his visit.  [name] 10/18/2018 8:58 AM   INSTRUCTIONS FOR DOWNLOADING THE MYCHART APP TO SMARTPHONE  - The patient must first make sure to have activated MyChart and know their login information - If Apple, go to [employer] and type in MyChart in the search bar and download the app. If Android, ask patient to go to [employer] and type in [place] in the search bar and download the app. The app is free but as with any other app downloads, their phone may require them to verify saved payment information or Apple/Android  password.  - The patient will need to then log into the app with their MyChart username and password, and select [facility] as their healthcare provider to link the account. When it is time for your visit, go to the MyChart app, find appointments, and click Begin Video Visit. Be sure to Select Allow for your device to access the Microphone and Camera for your visit. You will then be connected, and your provider will be with you shortly.  **If they have any issues connecting, or need assistance please contact MyChart service desk [phone])**  **If using a computer, in order to ensure the best quality for their visit they will need to use either of the following Internet Browsers: [employer], or Google Chrome**  IF USING DOXIMITY or DOXY.ME - The patient will receive a link just prior to their visit by text.     FULL LENGTH CONSENT FOR TELE-HEALTH VISIT   I hereby voluntarily request, consent and authorize [facility] and its employed or contracted physicians, physician assistants, nurse practitioners or other licensed health care professionals (the Practitioner), to provide me with telemedicine health care services (the Services") as deemed necessary by the treating Practitioner. I acknowledge and consent to receive the Services by the Practitioner via telemedicine. I understand that the telemedicine visit will involve communicating with the Practitioner through live audiovisual communication technology and the disclosure of certain medical information by electronic transmission. I acknowledge that I have been given the opportunity to request an in-person assessment or other available alternative prior to the telemedicine visit and am voluntarily participating in the telemedicine visit.  I understand that I have the right to withhold or withdraw my consent to the use of telemedicine in the course of my care at any time, without affecting my right to future care or treatment,  and that the Practitioner or I may terminate the telemedicine visit at any time. I understand that I have the right to inspect all information obtained and/or recorded in the course of the telemedicine visit and may receive copies of available information for a reasonable fee.  I understand that some of the potential risks of receiving the Services via telemedicine include:   Delay or interruption in medical evaluation due to technological equipment failure or disruption;  Information transmitted may not be sufficient (e.g. poor resolution of images) to allow for appropriate medical decision making by the Practitioner; and/or   In rare instances, security protocols could fail, causing a breach of personal health information.  Furthermore, I acknowledge that it is my responsibility to provide information about my medical history, conditions and care that is complete and accurate to the best of my ability. I acknowledge that Practitioner's advice, recommendations, and/or decision may be based on factors not within their control, such as incomplete or inaccurate data provided by me or distortions of diagnostic images or specimens that may result from electronic transmissions. I  understand that the practice of medicine is not an exact science and that Practitioner makes no warranties or guarantees regarding treatment outcomes. I acknowledge that I will receive a copy of this consent concurrently upon execution via email to the email address I last provided but may also request a printed copy by calling the office of [facility].    I understand that my insurance will be billed for this visit.   I have read or had this consent read to me.  I understand the contents of this consent, which adequately explains the benefits and risks of the Services being provided via telemedicine.   I have been provided ample opportunity to ask questions regarding this consent and the Services and have had my questions  answered to my satisfaction.  I give my informed consent for the services to be provided through the use of telemedicine in my medical care  By participating in this telemedicine visit I agree to the above.

## 2018-10-26 ENCOUNTER — Other Ambulatory Visit: Payer: Self-pay

## 2018-10-26 ENCOUNTER — Telehealth (INDEPENDENT_AMBULATORY_CARE_PROVIDER_SITE_OTHER): Payer: PPO | Admitting: Cardiology

## 2018-10-26 ENCOUNTER — Encounter: Payer: Self-pay | Admitting: Cardiology

## 2018-10-26 VITALS — BP 132/70

## 2018-10-26 DIAGNOSIS — I739 Peripheral vascular disease, unspecified: Secondary | ICD-10-CM

## 2018-10-26 DIAGNOSIS — I10 Essential (primary) hypertension: Secondary | ICD-10-CM

## 2018-10-26 DIAGNOSIS — E785 Hyperlipidemia, unspecified: Secondary | ICD-10-CM

## 2018-10-26 DIAGNOSIS — C8378 Burkitt lymphoma, lymph nodes of multiple sites: Secondary | ICD-10-CM | POA: Diagnosis not present

## 2018-10-26 NOTE — Progress Notes (Signed)
Virtual Visit via Video Note   This visit type was conducted due to national recommendations for restrictions regarding the COVID-19 Pandemic (e.g. social distancing) in an effort to limit this patient's exposure and mitigate transmission in our community.  Due to his co-morbid illnesses, this patient is at least at moderate risk for complications without adequate follow up.  This format is felt to be most appropriate for this patient at this time.  All issues noted in this document were discussed and addressed.  A limited physical exam was performed with this format.  Please refer to the patient's chart for his consent to telehealth for [facility].  Evaluation Performed:  Follow-up visit  This visit type was conducted due to national recommendations for restrictions regarding the COVID-19 Pandemic (e.g. social distancing).  This format is felt to be most appropriate for this patient at this time.  All issues noted in this document were discussed and addressed.  No physical exam was performed (except for noted visual exam findings with Video Visits).  Please refer to the patient's chart (MyChart message for video visits and phone note for telephone visits) for the patient's consent to telehealth for [facility].  Date:  10/26/2018  ID: Erik Vaughn, DOB 10-21-1950, MRN [id number]   Patient Location: [place] [address]   Provider location:   [place] [facility]  PCP:  Ernestene Kiel, MD  Cardiologist:  Jenne Campus, MD     Chief Complaint: Doing well  History of Present Illness:    Erik Vaughn is a 68 y.o. male  who presents via audio/video conferencing for a telehealth visit today.  With peripheral vascular disease problem with the left lower extremities and circulation to it, dyslipidemia, lymphoma overall doing very well denies having a chest pain tightness squeezing pressure burning chest.  He walks his dog on the regular basis and  have no difficulty doing it.  He said he is left leg giving him very rarely a problem.  We discussed again the issue of potentially fixing his artery in the leg he said he is feeling fine the way he is he does not want to do anything about it except for what he is doing so far.   The patient does not have symptoms concerning for COVID-19 infection (fever, chills, cough, or new SHORTNESS OF BREATH).    Prior CV studies:   The following studies were reviewed today:       Past Medical History:  Diagnosis Date  . Cancer ([place])    Burkett Lymphoma  . Hypertension   . PAD (peripheral artery disease) (HCC) left leg    Past Surgical History:  Procedure Laterality Date  . IR GENERIC HISTORICAL  12/31/2015   IR FLUORO GUIDE CV LINE RIGHT 12/31/2015 Arne Cleveland, MD [facility]INTER[facility]  . IR GENERIC HISTORICAL  12/31/2015   IR US GUIDE VASC ACCESS RIGHT 12/31/2015 Arne Cleveland, MD [facility]  . IR GENERIC HISTORICAL  07/20/2016   IR REMOVAL TUN ACCESS W/ PORT W/O FL MOD SED 07/20/2016 Jacqulynn Cadet, MD [facility]  . left salivary Left    removed     Current Meds  Medication Sig  . amLODipine (NORVASC) 10 MG tablet Take 1 tablet (10 mg total) by mouth every evening.  [name] b complex vitamins tablet Take 1 tablet by mouth daily.  . clopidogrel (PLAVIX) 75 MG tablet Take 1 tablet (75 mg total) by mouth every evening.  [name] lisinopril (PRINIVIL,ZESTRIL)  20 MG tablet Take 1 tablet (20 mg total) by mouth every evening.  . rosuvastatin (CRESTOR) 10 MG tablet Take 1 tablet (10 mg total) by mouth daily.      Family History: The patient's family history includes Alzheimer's disease (age of onset: 79) in his father.   ROS:   Please see the history of present illness.     All other systems reviewed and are negative.   Labs/Other Tests and Data Reviewed:     Recent Labs: 06/16/2018: ALT 28; BUN 11; Creatinine 0.83; Hemoglobin 12.6; Platelets 197; Potassium 3.6; Sodium 140  Recent Lipid Panel     Component Value Date/Time   CHOL 94 (L) 04/26/2018 0814   TRIG 117 04/26/2018 0814   HDL 31 (L) 04/26/2018 0814   CHOLHDL 3.0 04/26/2018 0814   LDLCALC 40 04/26/2018 0814      Exam:    Vital Signs:  BP 132/70     Wt Readings from Last 3 Encounters:  06/16/18 209 lb 8 oz (95 kg)  05/15/18 210 lb 9.6 oz (95.5 kg)  03/03/18 212 lb 9.6 oz (96.4 kg)     Well nourished, well developed in no acute distress. Alert awake oriented x3.  Talking to me through the video link.  Not in any distress  Diagnosis for this visit:   1. Essential hypertension   2. Peripheral vascular disease (HCC)   3. Burkitt's lymphoma of lymph nodes of multiple regions ([place])   4. Dyslipidemia      ASSESSMENT & PLAN:    1.  Essential hypertension blood pressure when he check at home is usually 130/80 we will continue present management. 2.  Peripheral vascular disease in form of left lower extremities claudication.  Stable he is walking on a regular basis and gradually getting better.  Does not want to do any intervention. 3.  Burkitt's lymphoma followed by internal medicine and oncology team stable. 4.  Dyslipidemia last fasting lipid profile show LDL at the very good level only 40 however his HDL is low at only 31 we discussed the measures he can take to increase this.  We were talking about exercises on the regular basis as well as good diet we discussed basic of Mediterranean diet.  He is wife was participating in our conversation and she was very eager to learn more about diets.  Luckily they were doing already quite well  COVID-19 Education: The signs and symptoms of COVID-19 were discussed with the patient and how to seek care for testing (follow up with PCP or arrange E-visit).  The importance of social distancing was discussed today.  Patient Risk:   After full review of this patients clinical status, I feel that they are at least moderate risk at this time.  Time:   Today, I have spent 21  minutes with the patient with telehealth technology discussing pt health issues.  I spent 5 minutes reviewing her chart before the visit.  Visit was finished at 9:40 AM.    Medication Adjustments/Labs and Tests Ordered: Current medicines are reviewed at length with the patient today.  Concerns regarding medicines are outlined above.  No orders of the defined types were placed in this encounter.  Medication changes: No orders of the defined types were placed in this encounter.    Disposition: Follow-up in 5 months  Signed, Park Liter, MD, [facility] 10/26/2018 9:41 AM    [place]

## 2018-10-26 NOTE — Patient Instructions (Signed)
Medication Instructions:  Your physician recommends that you continue on your current medications as directed. Please refer to the Current Medication list given to you today.  If you need a refill on your cardiac medications before your next appointment, please call your pharmacy.   Lab work: None.  If you have labs (blood work) drawn today and your tests are completely normal, you will receive your results only by: . MyChart Message (if you have MyChart) OR . A paper copy in the mail If you have any lab test that is abnormal or we need to change your treatment, we will call you to review the results.  Testing/Procedures: None.   Follow-Up: At [facility], you and your health needs are our priority.  As part of our continuing mission to provide you with exceptional heart care, we have created designated Provider Care Teams.  These Care Teams include your primary Cardiologist (physician) and Advanced Practice Providers (APPs -  Physician Assistants and Nurse Practitioners) who all work together to provide you with the care you need, when you need it. You will need a follow up appointment in 5 months.  Please call our office 2 months in advance to schedule this appointment.  You may see No primary care provider on file. or another member of our [facility] Provider Team in [place]: Brian Munley, MD . Rajan Revankar, MD  Any Other Special Instructions Will Be Listed Below (If Applicable).    

## 2018-11-01 DIAGNOSIS — N401 Enlarged prostate with lower urinary tract symptoms: Secondary | ICD-10-CM | POA: Diagnosis not present

## 2018-11-01 DIAGNOSIS — Z8042 Family history of malignant neoplasm of prostate: Secondary | ICD-10-CM | POA: Diagnosis not present

## 2018-11-01 DIAGNOSIS — R319 Hematuria, unspecified: Secondary | ICD-10-CM | POA: Diagnosis not present

## 2018-12-07 NOTE — Progress Notes (Signed)
[name]    HEMATOLOGY/ONCOLOGY CLINIC NOTE  Date of Service: 12/15/18      Patient Care Team: Ernestene Kiel, MD as PCP - General (Internal Medicine) [name], MD as Consulting Physician (Orthopedic Surgery)  CHIEF COMPLAINTS/PURPOSE OF CONSULTATION:  F/u for Burkitt's lymphoma  DIAGNOSIS  Burkitts Lymphoma currently in remission  Stage IVB Burkitt's lymphoma with t(8;14) translocation diagnosed 12/23/2015. This appears to be involving the sacrum causing sacral and root involvement with possible neurogenic bladder. PET CT scan as noted above shows extensive involvement with lymphoma. CSF negative for involvement. Bone marrow biopsy negative for involvement by lymphoma.  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 PET CT scan as noted above shows extensive involvement with lymphoma. CSF negative for involvement. Bone marrow biopsy negative for involvement by lymphoma. PET/CT scan after 3 cycles of EPOCH-R  on 03/02/2016  showed significant interval metabolic response.  Patient has completed cycle 6 of EPOCH-R and tolerated it well with no prohibitive toxicities. Grade 1 fatigue.  He has had some episodes of fevers including a periodontal abscess,  non-neutropenic fevers - unclear etiology, recent Escherichia coli urinary tract infection x 2 and Clostridium difficile diarrhea.  PET CT scan done after completion of this planned chemotherapy shows no clear evidence of residual lymphoma . Some borderline FDG avidity in the left sacral ala ?pathologic fracture vs residual lymphoma  Patient was evaluated by radiation oncology - no RT recommended to sacral lesion.  CURRENT TREATMENT -Active surveillance  Previous treatment The patient has completed 6 cycles of EPOCH -R and IT methotrexate x 4 doses starting with cycle 3 for CNS prophylaxis   HISTORY OF  PRESENTING ILLNESS:  Please see my previous note for details  INTERVAL HISTORY:   Erik Vaughn is here for a follow-up of his Burkitts lymphoma. The patient's last visit with [place] was on 06/16/2018. The pt reports that he is doing well overall.  The pt reports that he is having some cramping in his left leg that wake him up in the middle of the night. These cramps are worse after he stretches and get better as he walks around. He is taking magnesium and Vit D. Denies back pain, fever, chills, night sweats, new lumps or bumps. He saw a urologist who said he has a benign enlarged prostate and put him on Proscar. He is up to date on his vaccines.  Lab results today (12/15/2018) of CBC w/diff and CMP is as follows: all values are WNL except for glucose bld at 113 12/15/2018 LDH at 155 12/15/2018 Reticulocytes WNL  On review of systems, pt reports eating well, stable weight, cramping in the left leg and denies back pain, fever, chills, new lumps and bumps, and any other symptoms.   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   He smoked about 2 packs a day for 40 years.  SURGICAL HISTORY:  #1 left submandibular salivary gland benign tumor excision #2 port placement - 12/31/2015  SOCIAL HISTORY: Social History   Socioeconomic History  . Marital status: Married    Spouse name: Not on file  . Number of children: Not on file  . Years of education: Not on file  . Highest education level: Not on file  Occupational History  . Occupation: meter  specialist  Social Needs  . Financial resource strain: Not on file  . Food insecurity    Worry: Not on file    Inability: Not on file  . Transportation needs    Medical: Not on file    Non-medical: Not on file  Tobacco Use  . Smoking status: Former Smoker     Packs/day: 2.00    Years: 16.00    Pack years: 32.00    Quit date: 12/22/2005    Years since quitting: 12.9  . Smokeless tobacco: Current User    Types: Chew  Substance and Sexual Activity  . Alcohol use: No  . Drug use: No  . Sexual activity: Never  Lifestyle  . Physical activity    Days per week: Not on file    Minutes per session: Not on file  . Stress: Not on file  Relationships  . Social Herbalist on phone: Not on file    Gets together: Not on file    Attends religious service: Not on file    Active member of club or organization: Not on file    Attends meetings of clubs or organizations: Not on file    Relationship status: Not on file  . Intimate partner violence    Fear of current or ex partner: Not on file    Emotionally abused: Not on file    Physically abused: Not on file    Forced sexual activity: Not on file  Other Topics Concern  . Not on file  Social History Narrative  . Not on file   smoked 2 packs of cigarettes per day for 40 years quit about 8-9 years ago. Denies significant alcohol use. 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  FAMILY HISTORY:  Sister had breast cancer at age 91 years. Unknown if she had genetic testing One maternal uncle had melanoma Second maternal uncle had pancreatic cancer under the age of 63yrPaternal uncle with prostate cancer Dad had prostate cancer at age 4974years   ALLERGIES:  has No Known Allergies.  MEDICATIONS:  Current Outpatient Medications  Medication Sig Dispense Refill  . amLODipine (NORVASC) 10 MG tablet Take 1 tablet (10 mg total) by mouth every evening. 90 tablet 1  . b complex vitamins tablet Take 1 tablet by mouth daily.    . clopidogrel (PLAVIX) 75 MG tablet Take 1 tablet (75 mg total) by mouth every evening. 90 tablet 1  .  lisinopril (PRINIVIL,ZESTRIL) 20 MG tablet Take 1 tablet (20 mg total) by mouth every evening. 90 tablet 1  . rosuvastatin (CRESTOR) 10 MG tablet Take 1 tablet (10 mg total) by mouth daily. 90 tablet 1   No current facility-administered medications for this visit.     REVIEW OF SYSTEMS:    A 10+ POINT REVIEW OF SYSTEMS WAS OBTAINED including neurology, dermatology, psychiatry, cardiac, respiratory, lymph, extremities, GI, GU, Musculoskeletal, constitutional, breasts, reproductive, HEENT.  All pertinent positives are noted in the HPI.  All others are negative.   PHYSICAL EXAMINATION: ECOG PERFORMANCE STATUS: 1 - Symptomatic but completely ambulatory  GENERAL:alert, in no acute distress and comfortable SKIN: no acute rashes, no significant lesions EYES: conjunctiva are pink and non-injected, sclera anicteric OROPHARYNX: MMM, no exudates, no oropharyngeal erythema or ulceration NECK: supple, no JVD LYMPH:  no palpable  lymphadenopathy in the cervical, axillary or inguinal regions LUNGS: clear to auscultation b/l with normal respiratory effort HEART: regular rate & rhythm ABDOMEN:  normoactive bowel sounds , non tender, not distended. No palpable hepatosplenomegaly.  Extremity: no pedal edema PSYCH: alert & oriented x 3 with fluent speech NEURO: no focal motor/sensory deficits   LABORATORY DATA:  I have reviewed the data as listed . CBC Latest Ref Rng & Units 12/15/2018 06/16/2018 02/17/2018  WBC 4.0 - 10.5 K/uL 6.5 5.4 5.9  Hemoglobin 13.0 - 17.0 g/dL 13.5 12.6(L) 14.5  Hematocrit 39.0 - 52.0 % 40.3 37.1(L) 43.1  Platelets 150 - 400 K/uL 213 197 207   HGB 13.6  CBC    Component Value Date/Time   WBC 6.5 12/15/2018 1019   WBC 5.4 06/16/2018 1005   RBC 4.53 12/15/2018 1019   RBC 4.62 12/15/2018 1019   HGB 13.5 12/15/2018 1019   HGB 13.0 05/20/2017 1012   HCT 40.3 12/15/2018 1019   HCT 39.4 05/20/2017 1012   PLT 213 12/15/2018 1019   PLT 207 05/20/2017 1012   MCV 87.2  12/15/2018 1019   MCV 86.4 05/20/2017 1012   MCH 29.2 12/15/2018 1019   MCHC 33.5 12/15/2018 1019   RDW 13.0 12/15/2018 1019   RDW 13.7 05/20/2017 1012   LYMPHSABS 1.6 12/15/2018 1019   LYMPHSABS 1.4 05/20/2017 1012   MONOABS 0.6 12/15/2018 1019   MONOABS 0.6 05/20/2017 1012   EOSABS 0.1 12/15/2018 1019   EOSABS 0.2 05/20/2017 1012   BASOSABS 0.0 12/15/2018 1019   BASOSABS 0.0 05/20/2017 1012    ANC 100  CMP Latest Ref Rng & Units 12/15/2018 06/16/2018 02/17/2018  Glucose 70 - 99 mg/dL 113(H) 204(H) 112(H)  BUN 8 - 23 mg/dL 15 11 9   Creatinine 0.61 - 1.24 mg/dL 0.81 0.83 0.81  Sodium 135 - 145 mmol/L 139 140 143  Potassium 3.5 - 5.1 mmol/L 4.2 3.6 4.3  Chloride 98 - 111 mmol/L 106 108 105  CO2 22 - 32 mmol/L 23 23 27   Calcium 8.9 - 10.3 mg/dL 9.1 8.8(L) 10.0  Total Protein 6.5 - 8.1 g/dL 7.1 6.4(L) 7.4  Total Bilirubin 0.3 - 1.2 mg/dL 0.6 0.7 0.9  Alkaline Phos 38 - 126 U/L 94 75 90  AST 15 - 41 U/L 28 26 28   ALT 0 - 44 U/L 35 28 32  . Lab Results  Component Value Date   LDH 155 12/15/2018    RADIOGRAPHIC STUDIES: I have personally reviewed the radiological images as listed and agreed with the findings in the report.  .No results found.  ASSESSMENT & PLAN:   68 y.o. 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  Patient was evaluated by radiation oncology and no ISRT was recommended. His LDH level appears to have been increased and he had a repeat PET/CT scan on 10/29/2015 that shows no overt evidence of lymphoma recurrence.  PLAN: -Discussed pt labwork today, 12/15/2018; blood counts and chemistries are normal. -Discussed causes of cramping including dehydration, electrolyte imbalances, pinched nerves in the back. Recommend following up with PCP. -The pt shows no clinical or lab progression/return of Burkitt's  lymphoma at this time.  -No indication for further treatment at this time. -Pt completed chemotherapy in November 2017 -Will see the pt back in 6 months with labs.   #2 s/p neutropenia post Rituxan delayed  neutropenia- remains resolved.  #3  left sacral ala and S1 fracture. L5-S1 and S1-S2 left-sided nerve root compression -resolving symptoms.  #4 history of peripheral arterial disease  #5 chemotherapy related neuropathy - nearly resolved.  #6 Hypertension, dyslipidemia, peripheral arterial disease -Continue management as per primary care physician.  #7 Ex-smoker with 80-pack-year history of smoking.  PLAN:  -maintain active lifestyle   RTC with Dr Irene Limbo with labs in 6 months   No orders of the defined types were placed in this encounter.  The total time spent in the appt was 20 minutes and more than 50% was on counseling and direct patient cares.   Sullivan Lone MD MS AAHIVMS [facility] Hematology/Oncology Physician [place]  (Office):       [phone] (Work cell):  [phone] (Fax):           [phone]  By signing my name below, I, De Burrs, attest that this documentation has been prepared under the direction and in the presence of Irene Limbo, Cloria Spring, MD. Electronically Signed: De Burrs, Medical Scribe. 12/15/18. 11:54 AM.  .I have reviewed the above documentation for accuracy and completeness, and I agree with the above. Brunetta Genera MD

## 2018-12-14 ENCOUNTER — Other Ambulatory Visit: Payer: Self-pay | Admitting: *Deleted

## 2018-12-14 DIAGNOSIS — C8378 Burkitt lymphoma, lymph nodes of multiple sites: Secondary | ICD-10-CM

## 2018-12-15 ENCOUNTER — Inpatient Hospital Stay: Payer: PPO | Attending: Hematology

## 2018-12-15 ENCOUNTER — Inpatient Hospital Stay (HOSPITAL_BASED_OUTPATIENT_CLINIC_OR_DEPARTMENT_OTHER): Payer: PPO | Admitting: Hematology

## 2018-12-15 ENCOUNTER — Other Ambulatory Visit: Payer: Self-pay

## 2018-12-15 VITALS — BP 144/71 | HR 68 | Temp 98.5°F | Resp 18 | Ht 73.0 in | Wt 205.2 lb

## 2018-12-15 DIAGNOSIS — Z87891 Personal history of nicotine dependence: Secondary | ICD-10-CM

## 2018-12-15 DIAGNOSIS — R252 Cramp and spasm: Secondary | ICD-10-CM | POA: Insufficient documentation

## 2018-12-15 DIAGNOSIS — G620 Drug-induced polyneuropathy: Secondary | ICD-10-CM | POA: Insufficient documentation

## 2018-12-15 DIAGNOSIS — E785 Hyperlipidemia, unspecified: Secondary | ICD-10-CM

## 2018-12-15 DIAGNOSIS — Z8572 Personal history of non-Hodgkin lymphomas: Secondary | ICD-10-CM | POA: Insufficient documentation

## 2018-12-15 DIAGNOSIS — I739 Peripheral vascular disease, unspecified: Secondary | ICD-10-CM

## 2018-12-15 DIAGNOSIS — I10 Essential (primary) hypertension: Secondary | ICD-10-CM

## 2018-12-15 DIAGNOSIS — C8378 Burkitt lymphoma, lymph nodes of multiple sites: Secondary | ICD-10-CM

## 2018-12-15 LAB — RETICULOCYTES
Immature Retic Fract: 13.1 % (ref 2.3–15.9)
RBC.: 4.53 MIL/uL (ref 4.22–5.81)
Retic Count, Absolute: 66.1 10*3/uL (ref 19.0–186.0)
Retic Ct Pct: 1.5 % (ref 0.4–3.1)

## 2018-12-15 LAB — CMP (CANCER CENTER ONLY)
ALT: 35 U/L (ref 0–44)
AST: 28 U/L (ref 15–41)
Albumin: 4.2 g/dL (ref 3.5–5.0)
Alkaline Phosphatase: 94 U/L (ref 38–126)
Anion gap: 10 (ref 5–15)
BUN: 15 mg/dL (ref 8–23)
CO2: 23 mmol/L (ref 22–32)
Calcium: 9.1 mg/dL (ref 8.9–10.3)
Chloride: 106 mmol/L (ref 98–111)
Creatinine: 0.81 mg/dL (ref 0.61–1.24)
GFR, Est AFR Am: >60 mL/min (ref >60)
GFR, Estimated: >60 mL/min (ref >60)
Glucose, Bld: 113 mg/dL — ABNORMAL HIGH (ref 70–99)
Potassium: 4.2 mmol/L (ref 3.5–5.1)
Sodium: 139 mmol/L (ref 135–145)
Total Bilirubin: 0.6 mg/dL (ref 0.3–1.2)
Total Protein: 7.1 g/dL (ref 6.5–8.1)

## 2018-12-15 LAB — CBC WITH DIFFERENTIAL (CANCER CENTER ONLY)
Abs Immature Granulocytes: 0.01 10*3/uL (ref 0.00–0.07)
Basophils Absolute: 0 10*3/uL (ref 0.0–0.1)
Basophils Relative: 1 %
Eosinophils Absolute: 0.1 10*3/uL (ref 0.0–0.5)
Eosinophils Relative: 2 %
HCT: 40.3 % (ref 39.0–52.0)
Hemoglobin: 13.5 g/dL (ref 13.0–17.0)
Immature Granulocytes: 0 %
Lymphocytes Relative: 25 %
Lymphs Abs: 1.6 10*3/uL (ref 0.7–4.0)
MCH: 29.2 pg (ref 26.0–34.0)
MCHC: 33.5 g/dL (ref 30.0–36.0)
MCV: 87.2 fL (ref 80.0–100.0)
Monocytes Absolute: 0.6 10*3/uL (ref 0.1–1.0)
Monocytes Relative: 10 %
Neutro Abs: 4 10*3/uL (ref 1.7–7.7)
Neutrophils Relative %: 62 %
Platelet Count: 213 10*3/uL (ref 150–400)
RBC: 4.62 MIL/uL (ref 4.22–5.81)
RDW: 13 % (ref 11.5–15.5)
WBC Count: 6.5 10*3/uL (ref 4.0–10.5)
nRBC: 0 % (ref 0.0–0.2)

## 2018-12-15 LAB — LACTATE DEHYDROGENASE: LDH: 155 U/L (ref 98–192)

## 2018-12-18 ENCOUNTER — Telehealth: Payer: Self-pay | Admitting: Hematology

## 2018-12-18 NOTE — Telephone Encounter (Signed)
Scheduled appt per 7/17. Printed and mailed appt calendar.

## 2019-01-02 DIAGNOSIS — R319 Hematuria, unspecified: Secondary | ICD-10-CM | POA: Diagnosis not present

## 2019-01-02 DIAGNOSIS — N401 Enlarged prostate with lower urinary tract symptoms: Secondary | ICD-10-CM | POA: Diagnosis not present

## 2019-01-02 DIAGNOSIS — Z8042 Family history of malignant neoplasm of prostate: Secondary | ICD-10-CM | POA: Diagnosis not present

## 2019-01-15 DIAGNOSIS — N401 Enlarged prostate with lower urinary tract symptoms: Secondary | ICD-10-CM | POA: Diagnosis not present

## 2019-01-15 DIAGNOSIS — R319 Hematuria, unspecified: Secondary | ICD-10-CM | POA: Diagnosis not present

## 2019-02-27 DIAGNOSIS — N401 Enlarged prostate with lower urinary tract symptoms: Secondary | ICD-10-CM | POA: Diagnosis not present

## 2019-02-27 DIAGNOSIS — R319 Hematuria, unspecified: Secondary | ICD-10-CM | POA: Diagnosis not present

## 2019-03-26 ENCOUNTER — Other Ambulatory Visit: Payer: Self-pay | Admitting: Cardiology

## 2019-03-26 DIAGNOSIS — C8378 Burkitt lymphoma, lymph nodes of multiple sites: Secondary | ICD-10-CM

## 2019-03-26 DIAGNOSIS — I739 Peripheral vascular disease, unspecified: Secondary | ICD-10-CM

## 2019-03-27 DIAGNOSIS — Z6829 Body mass index (BMI) 29.0-29.9, adult: Secondary | ICD-10-CM | POA: Diagnosis not present

## 2019-03-27 DIAGNOSIS — G63 Polyneuropathy in diseases classified elsewhere: Secondary | ICD-10-CM | POA: Diagnosis not present

## 2019-03-27 DIAGNOSIS — R252 Cramp and spasm: Secondary | ICD-10-CM | POA: Diagnosis not present

## 2019-03-27 DIAGNOSIS — I10 Essential (primary) hypertension: Secondary | ICD-10-CM | POA: Diagnosis not present

## 2019-03-27 DIAGNOSIS — E785 Hyperlipidemia, unspecified: Secondary | ICD-10-CM | POA: Diagnosis not present

## 2019-03-27 DIAGNOSIS — Z79899 Other long term (current) drug therapy: Secondary | ICD-10-CM | POA: Diagnosis not present

## 2019-03-27 DIAGNOSIS — I739 Peripheral vascular disease, unspecified: Secondary | ICD-10-CM | POA: Diagnosis not present

## 2019-03-29 ENCOUNTER — Other Ambulatory Visit: Payer: Self-pay

## 2019-03-29 ENCOUNTER — Encounter: Payer: Self-pay | Admitting: Cardiology

## 2019-03-29 ENCOUNTER — Telehealth (INDEPENDENT_AMBULATORY_CARE_PROVIDER_SITE_OTHER): Payer: PPO | Admitting: Cardiology

## 2019-03-29 VITALS — BP 138/70 | HR 80 | Ht 73.0 in | Wt 203.2 lb

## 2019-03-29 DIAGNOSIS — I739 Peripheral vascular disease, unspecified: Secondary | ICD-10-CM

## 2019-03-29 DIAGNOSIS — C8378 Burkitt lymphoma, lymph nodes of multiple sites: Secondary | ICD-10-CM

## 2019-03-29 DIAGNOSIS — I10 Essential (primary) hypertension: Secondary | ICD-10-CM

## 2019-03-29 NOTE — Patient Instructions (Signed)
Medication Instructions:  Your physician recommends that you continue on your current medications as directed. Please refer to the Current Medication list given to you today.  *If you need a refill on your cardiac medications before your next appointment, please call your pharmacy*  Lab Work: Your physician recommends that you return for lab work when you come back for echo fasting : Lipids   If you have labs (blood work) drawn today and your tests are completely normal, you will receive your results only by: [name] MyChart Message (if you have MyChart) OR . A paper copy in the mail If you have any lab test that is abnormal or we need to change your treatment, we will call you to review the results.  Testing/Procedures: Your physician has requested that you have an echocardiogram. Echocardiography is a painless test that uses sound waves to create images of your heart. It provides your doctor with information about the size and shape of your heart and how well your heart's chambers and valves are working. This procedure takes approximately one hour. There are no restrictions for this procedure.    Follow-Up: At [facility], you and your health needs are our priority.  As part of our continuing mission to provide you with exceptional heart care, we have created designated Provider Care Teams.  These Care Teams include your primary Cardiologist (physician) and Advanced Practice Providers (APPs -  Physician Assistants and Nurse Practitioners) who all work together to provide you with the care you need, when you need it.  Your next appointment:   6 months  The format for your next appointment:   In Person  Provider:   You will see Dr. Agustin Cree.  Or, you can be scheduled with the following Advanced Practice Provider on your designated Care Team (at our [facility]):  Laurann Montana, FNP    Other Instructions    Echocardiogram An echocardiogram is a procedure that uses painless  sound waves (ultrasound) to produce an image of the heart. Images from an echocardiogram can provide important information about:  Signs of coronary artery disease (CAD).  Aneurysm detection. An aneurysm is a weak or damaged part of an artery wall that bulges out from the normal force of blood pumping through the body.  Heart size and shape. Changes in the size or shape of the heart can be associated with certain conditions, including heart failure, aneurysm, and CAD.  Heart muscle function.  Heart valve function.  Signs of a past heart attack.  Fluid buildup around the heart.  Thickening of the heart muscle.  A tumor or infectious growth around the heart valves. Tell a health care provider about:  Any allergies you have.  All medicines you are taking, including vitamins, herbs, eye drops, creams, and over-the-counter medicines.  Any blood disorders you have.  Any surgeries you have had.  Any medical conditions you have.  Whether you are pregnant or may be pregnant. What are the risks? Generally, this is a safe procedure. However, problems may occur, including:  Allergic reaction to dye (contrast) that may be used during the procedure. What happens before the procedure? No specific preparation is needed. You may eat and drink normally. What happens during the procedure?   An IV tube may be inserted into one of your veins.  You may receive contrast through this tube. A contrast is an injection that improves the quality of the pictures from your heart.  A gel will be applied to your chest.  A wand-like tool (transducer) will be moved over your chest. The gel will help to transmit the sound waves from the transducer.  The sound waves will harmlessly bounce off of your heart to allow the heart images to be captured in real-time motion. The images will be recorded on a computer. The procedure may vary among health care providers and hospitals. What happens after the  procedure?  You may return to your normal, everyday life, including diet, activities, and medicines, unless your health care provider tells you not to do that. Summary  An echocardiogram is a procedure that uses painless sound waves (ultrasound) to produce an image of the heart.  Images from an echocardiogram can provide important information about the size and shape of your heart, heart muscle function, heart valve function, and fluid buildup around your heart.  You do not need to do anything to prepare before this procedure. You may eat and drink normally.  After the echocardiogram is completed, you may return to your normal, everyday life, unless your health care provider tells you not to do that. This information is not intended to replace advice given to you by your health care provider. Make sure you discuss any questions you have with your health care provider. Document Released: 05/14/2000 Document Revised: 09/07/2018 Document Reviewed: 06/19/2016 Elsevier Patient Education  2020 [employer].

## 2019-03-29 NOTE — Progress Notes (Signed)
Virtual Visit via Video Note   This visit type was conducted due to national recommendations for restrictions regarding the COVID-19 Pandemic (e.g. social distancing) in an effort to limit this patient's exposure and mitigate transmission in our community.  Due to his co-morbid illnesses, this patient is at least at moderate risk for complications without adequate follow up.  This format is felt to be most appropriate for this patient at this time.  All issues noted in this document were discussed and addressed.  A limited physical exam was performed with this format.  Please refer to the patient's chart for his consent to telehealth for [facility].  Evaluation Performed:  Follow-up visit  This visit type was conducted due to national recommendations for restrictions regarding the COVID-19 Pandemic (e.g. social distancing).  This format is felt to be most appropriate for this patient at this time.  All issues noted in this document were discussed and addressed.  No physical exam was performed (except for noted visual exam findings with Video Visits).  Please refer to the patient's chart (MyChart message for video visits and phone note for telephone visits) for the patient's consent to telehealth for [facility].  Date:  03/29/2019  ID: [name], DOB [birth year], MRN [id number]   Patient Location: [place] [address]   Provider location:   [place] Office  PCP:  Ernestene Kiel, MD  Cardiologist:  Jenne Campus, MD     Chief Complaint: Doing well  History of Present Illness:    Erik Vaughn is a 68 y.o. male  who presents via audio/video conferencing for a telehealth visit today.  With past medical history significant for hypertension, peripheral vascular disease in form of lower extremities arterial disease, history of lymphoma comes today to my office for visit however we have to do a video visit he is doing well denies have any  chest pain tightness squeezing pressure burning chest.  He does have some exertional tightness in his legs but overall getting better.   The patient does not have symptoms concerning for COVID-19 infection (fever, chills, cough, or new SHORTNESS OF BREATH).    Prior CV studies:   The following studies were reviewed today:       Past Medical History:  Diagnosis Date  . Cancer ([place])    Burkett Lymphoma  . Hypertension   . PAD (peripheral artery disease) (HCC) left leg    Past Surgical History:  Procedure Laterality Date  . IR GENERIC HISTORICAL  12/31/2015   IR FLUORO GUIDE CV LINE RIGHT 12/31/2015 Arne Cleveland, MD [facility]-INTERV RAD  . IR GENERIC HISTORICAL  12/31/2015   IR US GUIDE VASC ACCESS RIGHT 12/31/2015 Arne Cleveland, MD [facility]INTER[facility]  . IR GENERIC HISTORICAL  07/20/2016   IR REMOVAL TUN ACCESS W/ PORT W/O FL MOD SED 07/20/2016 Jacqulynn Cadet, MD [facility]INTER[facility]  . left salivary Left    removed     Current Meds  Medication Sig  . amLODipine (NORVASC) 10 MG tablet TAKE 1 TABLET BY MOUTH EVERY EVENING.  [name] b complex vitamins tablet Take 1 tablet by mouth daily.  . clopidogrel (PLAVIX) 75 MG tablet TAKE 1 TABLET BY MOUTH EVERY EVENING.  . finasteride (PROSCAR) 5 MG tablet Take 5 mg by mouth daily.  [name] lisinopril (ZESTRIL) 20 MG tablet TAKE 1 TABLET BY MOUTH EVERY EVENING.  . rosuvastatin (CRESTOR) 10 MG tablet TAKE 1 TABLET BY MOUTH DAILY.  . tamsulosin (FLOMAX) 0.4 MG  CAPS capsule Take 0.4 mg by mouth daily.      Family History: The patient's family history includes Alzheimer's disease (age of onset: 80) in his father.   ROS:   Please see the history of present illness.     All other systems reviewed and are negative.   Labs/Other Tests and Data Reviewed:     Recent Labs: 12/15/2018: ALT 35; BUN 15; Creatinine 0.81; Hemoglobin 13.5; Platelet Count 213; Potassium 4.2; Sodium 139  Recent Lipid Panel    Component Value Date/Time   CHOL 94 (L) 04/26/2018 0814    TRIG 117 04/26/2018 0814   HDL 31 (L) 04/26/2018 0814   CHOLHDL 3.0 04/26/2018 0814   LDLCALC 40 04/26/2018 0814      Exam:    Vital Signs:  BP 138/70   Pulse 80   Ht 6\' 1"  (1.854 m)   Wt 203 lb 3.2 oz (92.2 kg)   SpO2 97%   BMI 26.81 kg/m     Wt Readings from Last 3 Encounters:  03/29/19 203 lb 3.2 oz (92.2 kg)  12/15/18 205 lb 3.2 oz (93.1 kg)  06/16/18 209 lb 8 oz (95 kg)     Well nourished, well developed in no acute distress. Alert awake and x3 not in distress doing well doing video visit with him he is not in distress  Diagnosis for this visit:   1. Essential hypertension   2. Peripheral vascular disease (HCC)   3. Burkitt's lymphoma of lymph nodes of multiple regions [facility])      ASSESSMENT & PLAN:    1.  Essential hypertension he does have whitecoat hypertension his blood pressure is elevated today 1 but when he check it at home it is always normal we will continue present management.  Asking to have an echocardiogram done to look at left ventricle hypertrophy if he does have it we need to be more aggressive with his blood pressure measurements if not we can to keep present management. Peripheral vascular disease again we discussed option potentially intervening on his lower extremities or simply doing exercises he preferred doing exercises. History of lymphoma doing well from that point review.  Followed by oncology team  COVID-19 Education: The signs and symptoms of COVID-19 were discussed with the patient and how to seek care for testing (follow up with PCP or arrange E-visit).  The importance of social distancing was discussed today.  Patient Risk:   After full review of this patients clinical status, I feel that they are at least moderate risk at this time.  Time:   Today, I have spent 5 minutes with the patient with telehealth technology discussing pt health issues.  I spent 15 minutes reviewing her chart before the visit.  Visit was finished at 8:45 AM.     Medication Adjustments/Labs and Tests Ordered: Current medicines are reviewed at length with the patient today.  Concerns regarding medicines are outlined above.  No orders of the defined types were placed in this encounter.  Medication changes: No orders of the defined types were placed in this encounter.    Disposition: Follow-up in 6 months  Signed, Park Liter, MD, [facility] 03/29/2019 8:43 AM    [place]

## 2019-05-08 ENCOUNTER — Other Ambulatory Visit: Payer: Self-pay

## 2019-05-08 ENCOUNTER — Ambulatory Visit (INDEPENDENT_AMBULATORY_CARE_PROVIDER_SITE_OTHER): Payer: PPO

## 2019-05-08 DIAGNOSIS — I739 Peripheral vascular disease, unspecified: Secondary | ICD-10-CM | POA: Diagnosis not present

## 2019-05-08 DIAGNOSIS — I10 Essential (primary) hypertension: Secondary | ICD-10-CM

## 2019-05-08 NOTE — Progress Notes (Signed)
Complete echocardiogram has been performed.  Jimmy Dalila Arca RDCS, RVT 

## 2019-05-09 LAB — LIPID PANEL
Chol/HDL Ratio: 2.4 ratio (ref 0.0–5.0)
Cholesterol, Total: 87 mg/dL — ABNORMAL LOW (ref 100–199)
HDL: 36 mg/dL — ABNORMAL LOW (ref >39)
LDL Chol Calc (NIH): 31 mg/dL (ref 0–99)
Triglycerides: 109 mg/dL (ref 0–149)
VLDL Cholesterol Cal: 20 mg/dL (ref 5–40)

## 2019-06-12 DIAGNOSIS — Z0000 Encounter for general adult medical examination without abnormal findings: Secondary | ICD-10-CM | POA: Diagnosis not present

## 2019-06-12 DIAGNOSIS — Z1331 Encounter for screening for depression: Secondary | ICD-10-CM | POA: Diagnosis not present

## 2019-06-12 DIAGNOSIS — Z6829 Body mass index (BMI) 29.0-29.9, adult: Secondary | ICD-10-CM | POA: Diagnosis not present

## 2019-06-12 DIAGNOSIS — H9193 Unspecified hearing loss, bilateral: Secondary | ICD-10-CM | POA: Diagnosis not present

## 2019-06-12 DIAGNOSIS — Z1339 Encounter for screening examination for other mental health and behavioral disorders: Secondary | ICD-10-CM | POA: Diagnosis not present

## 2019-06-20 ENCOUNTER — Other Ambulatory Visit: Payer: Self-pay

## 2019-06-20 ENCOUNTER — Inpatient Hospital Stay (HOSPITAL_BASED_OUTPATIENT_CLINIC_OR_DEPARTMENT_OTHER): Payer: PPO | Admitting: Hematology

## 2019-06-20 ENCOUNTER — Inpatient Hospital Stay: Payer: PPO | Attending: Hematology

## 2019-06-20 VITALS — BP 133/67 | HR 71 | Temp 97.4°F | Resp 16 | Ht 73.0 in | Wt 206.4 lb

## 2019-06-20 DIAGNOSIS — C8378 Burkitt lymphoma, lymph nodes of multiple sites: Secondary | ICD-10-CM

## 2019-06-20 DIAGNOSIS — R5383 Other fatigue: Secondary | ICD-10-CM | POA: Insufficient documentation

## 2019-06-20 DIAGNOSIS — Z87891 Personal history of nicotine dependence: Secondary | ICD-10-CM | POA: Diagnosis not present

## 2019-06-20 DIAGNOSIS — Z79899 Other long term (current) drug therapy: Secondary | ICD-10-CM | POA: Insufficient documentation

## 2019-06-20 DIAGNOSIS — I739 Peripheral vascular disease, unspecified: Secondary | ICD-10-CM | POA: Diagnosis not present

## 2019-06-20 DIAGNOSIS — I10 Essential (primary) hypertension: Secondary | ICD-10-CM | POA: Insufficient documentation

## 2019-06-20 DIAGNOSIS — C8370 Burkitt lymphoma, unspecified site: Secondary | ICD-10-CM | POA: Insufficient documentation

## 2019-06-20 DIAGNOSIS — E785 Hyperlipidemia, unspecified: Secondary | ICD-10-CM | POA: Diagnosis not present

## 2019-06-20 LAB — CBC WITH DIFFERENTIAL/PLATELET
Abs Immature Granulocytes: 0.01 10*3/uL (ref 0.00–0.07)
Basophils Absolute: 0 10*3/uL (ref 0.0–0.1)
Basophils Relative: 1 %
Eosinophils Absolute: 0.2 10*3/uL (ref 0.0–0.5)
Eosinophils Relative: 3 %
HCT: 39.6 % (ref 39.0–52.0)
Hemoglobin: 13.1 g/dL (ref 13.0–17.0)
Immature Granulocytes: 0 %
Lymphocytes Relative: 21 %
Lymphs Abs: 1.4 10*3/uL (ref 0.7–4.0)
MCH: 29 pg (ref 26.0–34.0)
MCHC: 33.1 g/dL (ref 30.0–36.0)
MCV: 87.6 fL (ref 80.0–100.0)
Monocytes Absolute: 0.7 10*3/uL (ref 0.1–1.0)
Monocytes Relative: 10 %
Neutro Abs: 4.4 10*3/uL (ref 1.7–7.7)
Neutrophils Relative %: 65 %
Platelets: 216 10*3/uL (ref 150–400)
RBC: 4.52 MIL/uL (ref 4.22–5.81)
RDW: 13.2 % (ref 11.5–15.5)
WBC: 6.6 10*3/uL (ref 4.0–10.5)
nRBC: 0 % (ref 0.0–0.2)

## 2019-06-20 LAB — CMP (CANCER CENTER ONLY)
ALT: 28 U/L (ref 0–44)
AST: 24 U/L (ref 15–41)
Albumin: 4 g/dL (ref 3.5–5.0)
Alkaline Phosphatase: 90 U/L (ref 38–126)
Anion gap: 9 (ref 5–15)
BUN: 10 mg/dL (ref 8–23)
CO2: 25 mmol/L (ref 22–32)
Calcium: 8.7 mg/dL — ABNORMAL LOW (ref 8.9–10.3)
Chloride: 106 mmol/L (ref 98–111)
Creatinine: 0.8 mg/dL (ref 0.61–1.24)
GFR, Est AFR Am: >60 mL/min
GFR, Estimated: >60 mL/min
Glucose, Bld: 141 mg/dL — ABNORMAL HIGH (ref 70–99)
Potassium: 4.4 mmol/L (ref 3.5–5.1)
Sodium: 140 mmol/L (ref 135–145)
Total Bilirubin: 0.6 mg/dL (ref 0.3–1.2)
Total Protein: 6.6 g/dL (ref 6.5–8.1)

## 2019-06-20 LAB — LACTATE DEHYDROGENASE: LDH: 137 U/L (ref 98–192)

## 2019-06-20 NOTE — Progress Notes (Signed)
Erik Vaughn    HEMATOLOGY/ONCOLOGY CLINIC NOTE  Date of Service: 06/20/19      Patient Care Team: Erik Kiel, MD as PCP - General (Internal Medicine) Erik Day, MD as Consulting Physician (Orthopedic Surgery)  CHIEF COMPLAINTS/PURPOSE OF CONSULTATION:  F/u for Burkitt's lymphoma  DIAGNOSIS  Burkitts Lymphoma currently in remission  Stage IVB Burkitt's lymphoma with t(8;14) translocation diagnosed 12/23/2015. This appears to be involving the sacrum causing sacral and root involvement with possible neurogenic bladder. PET CT scan as noted above shows extensive involvement with lymphoma. CSF negative for involvement. Bone marrow biopsy negative for involvement by lymphoma.  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 PET CT scan as noted above shows extensive involvement with lymphoma. CSF negative for involvement. Bone marrow biopsy negative for involvement by lymphoma. PET/CT scan after 3 cycles of EPOCH-R  on 03/02/2016  showed significant interval metabolic response.  Patient has completed cycle 6 of EPOCH-R and tolerated it well with no prohibitive toxicities. Grade 1 fatigue.  He has had some episodes of fevers including a periodontal abscess,  non-neutropenic fevers - unclear etiology, recent Escherichia coli urinary tract infection x 2 and Clostridium difficile diarrhea.  PET CT scan done after completion of this planned chemotherapy shows no clear evidence of residual lymphoma . Some borderline FDG avidity in the left sacral ala ?pathologic fracture vs residual lymphoma  Patient was evaluated by radiation oncology - no RT recommended to sacral lesion.  CURRENT TREATMENT -Active surveillance  Previous treatment The patient has completed 6 cycles of EPOCH -R and IT methotrexate x 4 doses starting with cycle 3 for CNS prophylaxis   HISTORY OF  PRESENTING ILLNESS:  Please see my previous note for details  INTERVAL HISTORY:   Erik Vaughn is here for a follow-up of his Burkitts lymphoma. The patient's last visit with [place] was on 12/15/2018. The pt reports that he is doing well overall.  The pt reports that he has been well and has had no concerns in the interim. He has been eating well and doing his best to stay safe in the midst of the pandemic. Pt has continued to follow with his other specialists. His prostate was giving him some issues but he was prescribed medication by his Urologist and his symptoms are currently being controlled.   Pt has received his annual flu vaccine. He got his first dose of the COVID19 vaccine on 06/17/2019 and will get his second dose on 07/07/2019. Pt received his pneumonia vaccines in September 2019 and January 2020.  Lab results today (06/20/19) of CBC w/diff and CMP is as follows: all values are WNL except for Glucose at 141, Calcium at 8.7. 06/20/19 LDH at 137  On review of systems, pt reports eating well and denies fevers, chills, night sweats, unexpected weight loss, skin rashes, abdominal pain, leg swelling and any other symptoms.   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   He smoked about 2 packs a Vaughn for 40 years.  SURGICAL HISTORY:  #1 left submandibular salivary gland benign tumor excision #2 port placement - 12/31/2015  SOCIAL HISTORY: Social History   Socioeconomic History  . Marital status: Married    Spouse name: Not on file  . Number of children: Not on file  .  Years of education: Not on file  . Highest education level: Not on file  Occupational History  . Occupation: Geneticist, molecular  Tobacco Use  . Smoking status: Former Smoker    Packs/Vaughn: 2.00    Years: 16.00    Pack years: 32.00     Quit date: 12/22/2005    Years since quitting: 13.5  . Smokeless tobacco: Current User    Types: Chew  Substance and Sexual Activity  . Alcohol use: No  . Drug use: No  . Sexual activity: Never  Other Topics Concern  . Not on file  Social History Narrative  . Not on file   Social Determinants of Health   Financial Resource Strain:   . Difficulty of Paying Living Expenses: Not on file  Food Insecurity:   . Worried About Charity fundraiser in the Last Year: Not on file  . Ran Out of Food in the Last Year: Not on file  Transportation Needs:   . Lack of Transportation (Medical): Not on file  . Lack of Transportation (Non-Medical): Not on file  Physical Activity:   . Days of Exercise per Week: Not on file  . Minutes of Exercise per Session: Not on file  Stress:   . Feeling of Stress : Not on file  Social Connections:   . Frequency of Communication with Friends and Family: Not on file  . Frequency of Social Gatherings with Friends and Family: Not on file  . Attends Religious Services: Not on file  . Active Member of Clubs or Organizations: Not on file  . Attends Archivist Meetings: Not on file  . Marital Status: Not on file  Intimate Partner Violence:   . Fear of Current or Ex-Partner: Not on file  . Emotionally Abused: Not on file  . Physically Abused: Not on file  . Sexually Abused: Not on file   smoked 2 packs of cigarettes per Vaughn for 40 years quit about 8-9 years ago. Denies significant alcohol use. 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  FAMILY HISTORY:  Sister had breast cancer at age 9 years. Unknown if she had genetic testing One maternal uncle had melanoma Second maternal uncle had pancreatic cancer under the age of 58yrPaternal uncle with prostate cancer Dad had prostate  cancer at age 259years   ALLERGIES:  has No Known Allergies.  MEDICATIONS:  Current Outpatient Medications  Medication Sig Dispense Refill  . amLODipine (NORVASC) 10 MG tablet TAKE 1 TABLET BY MOUTH EVERY EVENING. 90 tablet 1  . b complex vitamins tablet Take 1 tablet by mouth daily.    . clopidogrel (PLAVIX) 75 MG tablet TAKE 1 TABLET BY MOUTH EVERY EVENING. 90 tablet 1  . finasteride (PROSCAR) 5 MG tablet Take 5 mg by mouth daily.    .Erik Vaughn Kitchenlisinopril (ZESTRIL) 20 MG tablet TAKE 1 TABLET BY MOUTH EVERY EVENING. 90 tablet 1  . rosuvastatin (CRESTOR) 10 MG tablet TAKE 1 TABLET BY MOUTH DAILY. 90 tablet 1  . tamsulosin (FLOMAX) 0.4 MG CAPS capsule Take 0.4 mg by mouth daily.     No current facility-administered medications for this visit.    REVIEW OF SYSTEMS:   A 10+ POINT REVIEW OF SYSTEMS WAS OBTAINED including neurology, dermatology, psychiatry, cardiac, respiratory, lymph, extremities, GI, GU, Musculoskeletal, constitutional, breasts, reproductive,  HEENT.  All pertinent positives are noted in the HPI.  All others are negative.   PHYSICAL EXAMINATION: ECOG PERFORMANCE STATUS: 1 - Symptomatic but completely ambulatory   GENERAL:alert, in no acute distress and comfortable SKIN: no acute rashes, no significant lesions EYES: conjunctiva are pink and non-injected, sclera anicteric OROPHARYNX: MMM, no exudates, no oropharyngeal erythema or ulceration NECK: supple, no JVD LYMPH:  no palpable lymphadenopathy in the cervical, axillary or inguinal regions LUNGS: clear to auscultation b/l with normal respiratory effort HEART: regular rate & rhythm ABDOMEN:  normoactive bowel sounds , non tender, not distended. No palpable hepatosplenomegaly.  Extremity: no pedal edema PSYCH: alert & oriented x 3 with fluent speech NEURO: no focal motor/sensory deficits  LABORATORY DATA:  I have reviewed the data as listed . CBC Latest Ref Rng & Units 06/20/2019 12/15/2018 06/16/2018  WBC 4.0 - 10.5 K/uL 6.6  6.5 5.4  Hemoglobin 13.0 - 17.0 g/dL 13.1 13.5 12.6(L)  Hematocrit 39.0 - 52.0 % 39.6 40.3 37.1(L)  Platelets 150 - 400 K/uL 216 213 197   HGB 13.6  CBC    Component Value Date/Time   WBC 6.6 06/20/2019 1142   RBC 4.52 06/20/2019 1142   HGB 13.1 06/20/2019 1142   HGB 13.5 12/15/2018 1019   HGB 13.0 05/20/2017 1012   HCT 39.6 06/20/2019 1142   HCT 39.4 05/20/2017 1012   PLT 216 06/20/2019 1142   PLT 213 12/15/2018 1019   PLT 207 05/20/2017 1012   MCV 87.6 06/20/2019 1142   MCV 86.4 05/20/2017 1012   MCH 29.0 06/20/2019 1142   MCHC 33.1 06/20/2019 1142   RDW 13.2 06/20/2019 1142   RDW 13.7 05/20/2017 1012   LYMPHSABS 1.4 06/20/2019 1142   LYMPHSABS 1.4 05/20/2017 1012   MONOABS 0.7 06/20/2019 1142   MONOABS 0.6 05/20/2017 1012   EOSABS 0.2 06/20/2019 1142   EOSABS 0.2 05/20/2017 1012   BASOSABS 0.0 06/20/2019 1142   BASOSABS 0.0 05/20/2017 1012    ANC 100  CMP Latest Ref Rng & Units 06/20/2019 12/15/2018 06/16/2018  Glucose 70 - 99 mg/dL 141(H) 113(H) 204(H)  BUN 8 - 23 mg/dL _0 Creatinine 0.61 - 1.24 mg/dL 0.80 0.81 0.83  Sodium 135 - 145 mmol/L 140 139 140  Potassium 3.5 - 5.1 mmol/L 4.4 4.2 3.6  Chloride 98 - 111 mmol/L 106 106 108  CO2 22 - 32 mmol/L _1 Calcium 8.9 - 10.3 mg/dL 8.7(L) 9.1 8.8(L)  Total Protein 6.5 - 8.1 g/dL 6.6 7.1 6.4(L)  Total Bilirubin 0.3 - 1.2 mg/dL 0.6 0.6 0.7  Alkaline Phos 38 - 126 U/L 90 94 75  AST 15 - 41 U/L _2 ALT 0 - 44 U/L 28 35 28  . Lab Results  Component Value Date   LDH 137 06/20/2019    RADIOGRAPHIC STUDIES: I have personally reviewed the radiological images as listed and agreed with the findings in the report.  .No results found.  ASSESSMENT & PLAN:   70 y.o. 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  Patient was evaluated by  radiation oncology and no ISRT was recommended. His LDH level appears to have been increased and he had a repeat PET/CT scan on 10/29/2015 that shows no overt evidence of lymphoma recurrence.  PLAN: -Discussed pt labwork today, 06/20/19; blood counts are  nml, blood chemistries are steady -Discussed 06/20/19 LDH is WNL at 137 -Pt completed chemotherapy in November 2017, is now over three years out -The pt shows no clinical or lab progression/return of Burkitt's lymphoma at this time.  -No indication for further treatment at this time.  -Will continue watching with labs and scans every 6 months  -Will move to annual visits five years post-treatment -Recommend pt f/u with second dose of COVID19 vaccine as scheduled -Will see back in 6 months with labs  #2 s/p neutropenia post Rituxan delayed neutropenia- remains resolved.  #3  left sacral ala and S1 fracture. L5-S1 and S1-S2 left-sided nerve root compression -resolving symptoms.  #4 history of peripheral arterial disease  #5 chemotherapy related neuropathy - nearly resolved.  #6 Hypertension, dyslipidemia, peripheral arterial disease -Continue management as per primary care physician.  #7 Ex-smoker with 80-pack-year history of smoking.  PLAN:  -maintain active lifestyle   FOLLOW UP: RTC with Dr Irene Limbo with labs in 6 months   The total time spent in the appt was 20 minutes and more than 50% was on counseling and direct patient cares.  All of the patient's questions were answered with apparent satisfaction. The patient knows to call the clinic with any problems, questions or concerns.    Sullivan Lone MD [place] AAHIVMS [facility] Hematology/Oncology Physician [facility]  (Office):       [phone] (Work cell):  [phone] (Fax):           [phone]  I, Yevette Edwards, am acting as a scribe for Dr. Sullivan Lone.   .I have reviewed the above documentation for accuracy and completeness, and I agree with the  above. Brunetta Genera MD

## 2019-06-21 ENCOUNTER — Telehealth: Payer: Self-pay | Admitting: Hematology

## 2019-06-21 NOTE — Telephone Encounter (Signed)
Scheduled appt per 1/20 los.  Sent a message to HIM pool to get a calendar mailed out. 

## 2019-08-28 ENCOUNTER — Telehealth: Payer: Self-pay | Admitting: Hematology

## 2019-08-28 NOTE — Telephone Encounter (Signed)
R/s 7/20 appt per provider template change. Called and left msg. Mailed printout

## 2019-09-05 DIAGNOSIS — N401 Enlarged prostate with lower urinary tract symptoms: Secondary | ICD-10-CM | POA: Diagnosis not present

## 2019-09-05 DIAGNOSIS — R319 Hematuria, unspecified: Secondary | ICD-10-CM | POA: Diagnosis not present

## 2019-09-10 ENCOUNTER — Other Ambulatory Visit: Payer: Self-pay | Admitting: Cardiology

## 2019-09-10 DIAGNOSIS — C8378 Burkitt lymphoma, lymph nodes of multiple sites: Secondary | ICD-10-CM

## 2019-09-10 DIAGNOSIS — I739 Peripheral vascular disease, unspecified: Secondary | ICD-10-CM

## 2019-12-04 ENCOUNTER — Other Ambulatory Visit: Payer: Self-pay | Admitting: Cardiology

## 2019-12-04 DIAGNOSIS — I739 Peripheral vascular disease, unspecified: Secondary | ICD-10-CM

## 2019-12-04 DIAGNOSIS — C8378 Burkitt lymphoma, lymph nodes of multiple sites: Secondary | ICD-10-CM

## 2019-12-18 ENCOUNTER — Inpatient Hospital Stay (HOSPITAL_BASED_OUTPATIENT_CLINIC_OR_DEPARTMENT_OTHER): Payer: PPO | Admitting: Hematology

## 2019-12-18 ENCOUNTER — Ambulatory Visit: Payer: PPO | Admitting: Hematology

## 2019-12-18 ENCOUNTER — Other Ambulatory Visit: Payer: Self-pay

## 2019-12-18 ENCOUNTER — Other Ambulatory Visit: Payer: PPO

## 2019-12-18 ENCOUNTER — Inpatient Hospital Stay: Payer: PPO | Attending: Hematology

## 2019-12-18 VITALS — BP 128/67 | HR 69 | Temp 98.1°F | Resp 18 | Ht 73.0 in | Wt 203.4 lb

## 2019-12-18 DIAGNOSIS — E785 Hyperlipidemia, unspecified: Secondary | ICD-10-CM | POA: Diagnosis not present

## 2019-12-18 DIAGNOSIS — C8378 Burkitt lymphoma, lymph nodes of multiple sites: Secondary | ICD-10-CM | POA: Diagnosis not present

## 2019-12-18 DIAGNOSIS — Z8572 Personal history of non-Hodgkin lymphomas: Secondary | ICD-10-CM | POA: Diagnosis not present

## 2019-12-18 DIAGNOSIS — Z79899 Other long term (current) drug therapy: Secondary | ICD-10-CM | POA: Insufficient documentation

## 2019-12-18 DIAGNOSIS — I10 Essential (primary) hypertension: Secondary | ICD-10-CM | POA: Diagnosis not present

## 2019-12-18 DIAGNOSIS — Z87891 Personal history of nicotine dependence: Secondary | ICD-10-CM | POA: Insufficient documentation

## 2019-12-18 LAB — CBC WITH DIFFERENTIAL/PLATELET
Abs Immature Granulocytes: 0.01 10*3/uL (ref 0.00–0.07)
Basophils Absolute: 0 10*3/uL (ref 0.0–0.1)
Basophils Relative: 1 %
Eosinophils Absolute: 0.2 10*3/uL (ref 0.0–0.5)
Eosinophils Relative: 3 %
HCT: 38.9 % — ABNORMAL LOW (ref 39.0–52.0)
Hemoglobin: 12.5 g/dL — ABNORMAL LOW (ref 13.0–17.0)
Immature Granulocytes: 0 %
Lymphocytes Relative: 27 %
Lymphs Abs: 1.8 10*3/uL (ref 0.7–4.0)
MCH: 27.8 pg (ref 26.0–34.0)
MCHC: 32.1 g/dL (ref 30.0–36.0)
MCV: 86.6 fL (ref 80.0–100.0)
Monocytes Absolute: 0.8 10*3/uL (ref 0.1–1.0)
Monocytes Relative: 11 %
Neutro Abs: 3.9 10*3/uL (ref 1.7–7.7)
Neutrophils Relative %: 58 %
Platelets: 218 10*3/uL (ref 150–400)
RBC: 4.49 MIL/uL (ref 4.22–5.81)
RDW: 13.6 % (ref 11.5–15.5)
WBC: 6.6 10*3/uL (ref 4.0–10.5)
nRBC: 0 % (ref 0.0–0.2)

## 2019-12-18 LAB — LACTATE DEHYDROGENASE: LDH: 147 U/L (ref 98–192)

## 2019-12-18 LAB — CMP (CANCER CENTER ONLY)
ALT: 26 U/L (ref 0–44)
AST: 23 U/L (ref 15–41)
Albumin: 4.1 g/dL (ref 3.5–5.0)
Alkaline Phosphatase: 91 U/L (ref 38–126)
Anion gap: 9 (ref 5–15)
BUN: 17 mg/dL (ref 8–23)
CO2: 25 mmol/L (ref 22–32)
Calcium: 9.6 mg/dL (ref 8.9–10.3)
Chloride: 106 mmol/L (ref 98–111)
Creatinine: 0.84 mg/dL (ref 0.61–1.24)
GFR, Est AFR Am: >60 mL/min (ref >60)
GFR, Estimated: >60 mL/min (ref >60)
Glucose, Bld: 122 mg/dL — ABNORMAL HIGH (ref 70–99)
Potassium: 4.3 mmol/L (ref 3.5–5.1)
Sodium: 140 mmol/L (ref 135–145)
Total Bilirubin: 0.6 mg/dL (ref 0.3–1.2)
Total Protein: 7 g/dL (ref 6.5–8.1)

## 2019-12-18 NOTE — Progress Notes (Signed)
Erik Vaughn    HEMATOLOGY/ONCOLOGY CLINIC NOTE  Date of Service: 12/18/19      Patient Care Team: Ernestene Kiel, MD as PCP - General (Internal Medicine) [name], MD as Consulting Physician (Orthopedic Surgery)  CHIEF COMPLAINTS/PURPOSE OF CONSULTATION:  F/u for Burkitt's lymphoma  DIAGNOSIS  Burkitts Lymphoma currently in remission  Stage IVB Burkitt's lymphoma with t(8;14) translocation diagnosed 12/23/2015. This appears to be involving the sacrum causing sacral and root involvement with possible neurogenic bladder. PET CT scan as noted above shows extensive involvement with lymphoma. CSF negative for involvement. Bone marrow biopsy negative for involvement by lymphoma.  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 PET CT scan as noted above shows extensive involvement with lymphoma. CSF negative for involvement. Bone marrow biopsy negative for involvement by lymphoma. PET/CT scan after 3 cycles of EPOCH-R  on 03/02/2016  showed significant interval metabolic response.  Patient has completed cycle 6 of EPOCH-R and tolerated it well with no prohibitive toxicities. Grade 1 fatigue.  He has had some episodes of fevers including a periodontal abscess,  non-neutropenic fevers - unclear etiology, recent Escherichia coli urinary tract infection x 2 and Clostridium difficile diarrhea.  PET CT scan done after completion of this planned chemotherapy shows no clear evidence of residual lymphoma . Some borderline FDG avidity in the left sacral ala ?pathologic fracture vs residual lymphoma  Patient was evaluated by radiation oncology - no RT recommended to sacral lesion.  CURRENT TREATMENT -Active surveillance  Previous treatment The patient has completed 6 cycles of EPOCH -R and IT methotrexate x 4 doses starting with cycle 3 for CNS prophylaxis   HISTORY OF  PRESENTING ILLNESS:  Please see my previous note for details  INTERVAL HISTORY:  Erik Vaughn is here for a follow-up of his Burkitts lymphoma. The patient's last visit with [place] was on 06/20/2019. The pt reports that he is doing well overall.  The pt reports that he has been eating and feeling well. He has had some difficulty getting rest at night. Pt is waking up several times per night to urinate. He is taking Flomax to improve these symptoms. He has had his COVID19 vaccines.   Lab results today (12/18/19) of CBC w/diff and CMP is as follows: all values are WNL except for Hgb at 12.5, HCT at 38.9, Glucose at 122. 12/18/2019 LDH at 147  On review of systems, pt reports polyuria and denies fevers, chills, night sweats, abdominal pain, loss of appetite, unexpected weight loss, bowel habit changes and any other symptoms.    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   He smoked about 2 packs a day for 40 years.  SURGICAL HISTORY:  #1 left submandibular salivary gland benign tumor excision #2 port placement - 12/31/2015  SOCIAL HISTORY: Social History   Socioeconomic History  . Marital status: Married    Spouse name: Not on file  . Number of children: Not on file  . Years of education: Not on file  . Highest education level: Not on file  Occupational History  . Occupation: Geneticist, molecular  Tobacco Use  . Smoking status: Former Smoker    Packs/day: 2.00    Years: 16.00    Pack years: 32.00    Quit date: 12/22/2005  Years since quitting: 13.9  . Smokeless tobacco: Current User    Types: Chew  Vaping Use  . Vaping Use: Never used  Substance and Sexual Activity  . Alcohol use: No  . Drug use: No  . Sexual activity: Never  Other Topics Concern  . Not on file  Social History Narrative  .  Not on file   Social Determinants of Health   Financial Resource Strain:   . Difficulty of Paying Living Expenses:   Food Insecurity:   . Worried About Charity fundraiser in the Last Year:   . Arboriculturist in the Last Year:   Transportation Needs:   . Film/video editor (Medical):   Erik Vaughn Lack of Transportation (Non-Medical):   Physical Activity:   . Days of Exercise per Week:   . Minutes of Exercise per Session:   Stress:   . Feeling of Stress :   Social Connections:   . Frequency of Communication with Friends and Family:   . Frequency of Social Gatherings with Friends and Family:   . Attends Religious Services:   . Active Member of Clubs or Organizations:   . Attends Archivist Meetings:   Erik Vaughn Marital Status:   Intimate Partner Violence:   . Fear of Current or Ex-Partner:   . Emotionally Abused:   Erik Vaughn Physically Abused:   . Sexually Abused:    smoked 2 packs of cigarettes per day for 40 years quit about 8-9 years ago. Denies significant alcohol use. 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  FAMILY HISTORY:  Sister had breast cancer at age 26 years. Unknown if she had genetic testing One maternal uncle had melanoma Second maternal uncle had pancreatic cancer under the age of 26yrPaternal uncle with prostate cancer Dad had prostate cancer at age 1583years   ALLERGIES:  has No Known Allergies.  MEDICATIONS:  Current Outpatient Medications  Medication Sig Dispense Refill  . amLODipine (NORVASC) 10 MG tablet TAKE 1 TABLET BY MOUTH EVERY EVENING 90 tablet 0  . b complex vitamins tablet Take 1 tablet by mouth daily.    . clopidogrel (PLAVIX) 75 MG tablet TAKE 1 TABLET BY MOUTH EVERY EVENING 90 tablet 0  . finasteride (PROSCAR) 5 MG tablet Take 5 mg by mouth daily.    .Erik Kitchenlisinopril (ZESTRIL)  20 MG tablet TAKE 1 TABLET BY MOUTH EVERY EVENING 90 tablet 0  . rosuvastatin (CRESTOR) 10 MG tablet TAKE 1 TABLET BY MOUTH DAILY 90 tablet 0  . tamsulosin (FLOMAX) 0.4 MG CAPS capsule Take 0.4 mg by mouth daily.     No current facility-administered medications for this visit.    REVIEW OF SYSTEMS:   A 10+ POINT REVIEW OF SYSTEMS WAS OBTAINED including neurology, dermatology, psychiatry, cardiac, respiratory, lymph, extremities, GI, GU, Musculoskeletal, constitutional, breasts, reproductive, HEENT.  All pertinent positives are noted in the HPI.  All others are negative.   PHYSICAL EXAMINATION: ECOG PERFORMANCE STATUS: 1 - Symptomatic but completely ambulatory   GENERAL:alert, in no acute distress and comfortable SKIN: no acute rashes, no significant lesions EYES: conjunctiva are pink and non-injected, sclera anicteric OROPHARYNX: MMM, no exudates, no oropharyngeal erythema or ulceration NECK: supple, no JVD LYMPH:  no palpable lymphadenopathy in the cervical, axillary or inguinal regions LUNGS: clear to auscultation b/l with  normal respiratory effort HEART: regular rate & rhythm ABDOMEN:  normoactive bowel sounds , non tender, not distended. No palpable hepatosplenomegaly.  Extremity: no pedal edema PSYCH: alert & oriented x 3 with fluent speech NEURO: no focal motor/sensory deficits  LABORATORY DATA:  I have reviewed the data as listed . CBC Latest Ref Rng & Units 12/18/2019 06/20/2019 12/15/2018  WBC 4.0 - 10.5 K/uL 6.6 6.6 6.5  Hemoglobin 13.0 - 17.0 g/dL 12.5(L) 13.1 13.5  Hematocrit 39 - 52 % 38.9(L) 39.6 40.3  Platelets 150 - 400 K/uL 218 216 213   HGB 13.6  CBC    Component Value Date/Time   WBC 6.6 12/18/2019 1303   RBC 4.49 12/18/2019 1303   HGB 12.5 (L) 12/18/2019 1303   HGB 13.5 12/15/2018 1019   HGB 13.0 05/20/2017 1012   HCT 38.9 (L) 12/18/2019 1303   HCT 39.4 05/20/2017 1012   PLT 218 12/18/2019 1303   PLT 213 12/15/2018 1019   PLT 207 05/20/2017 1012    MCV 86.6 12/18/2019 1303   MCV 86.4 05/20/2017 1012   MCH 27.8 12/18/2019 1303   MCHC 32.1 12/18/2019 1303   RDW 13.6 12/18/2019 1303   RDW 13.7 05/20/2017 1012   LYMPHSABS 1.8 12/18/2019 1303   LYMPHSABS 1.4 05/20/2017 1012   MONOABS 0.8 12/18/2019 1303   MONOABS 0.6 05/20/2017 1012   EOSABS 0.2 12/18/2019 1303   EOSABS 0.2 05/20/2017 1012   BASOSABS 0.0 12/18/2019 1303   BASOSABS 0.0 05/20/2017 1012    ANC 100  CMP Latest Ref Rng & Units 12/18/2019 06/20/2019 12/15/2018  Glucose 70 - 99 mg/dL 122(H) 141(H) 113(H)  BUN 8 - 23 mg/dL 17 10 15   Creatinine 0.61 - 1.24 mg/dL 0.84 0.80 0.81  Sodium 135 - 145 mmol/L 140 140 139  Potassium 3.5 - 5.1 mmol/L 4.3 4.4 4.2  Chloride 98 - 111 mmol/L 106 106 106  CO2 22 - 32 mmol/L 25 25 23   Calcium 8.9 - 10.3 mg/dL 9.6 8.7(L) 9.1  Total Protein 6.5 - 8.1 g/dL 7.0 6.6 7.1  Total Bilirubin 0.3 - 1.2 mg/dL 0.6 0.6 0.6  Alkaline Phos 38 - 126 U/L 91 90 94  AST 15 - 41 U/L 23 24 28   ALT 0 - 44 U/L 26 28 35  . Lab Results  Component Value Date   LDH 147 12/18/2019    RADIOGRAPHIC STUDIES: I have personally reviewed the radiological images as listed and agreed with the findings in the report.  .No results found.  ASSESSMENT & PLAN:   69 y.o. 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  Patient was evaluated by radiation oncology and no ISRT was recommended. His LDH level appears to have been increased and he had a repeat PET/CT scan on 10/29/2015 that shows no overt evidence of lymphoma recurrence.  PLAN: -Discussed pt labwork today, 12/18/19; minimal anemia, other blood counts and chemistries are nml, LDH is WNL -No constitutional symptoms, no lymphadenopathy or splenomegaly -The pt shows no lab or clinical evidence of Burkitts lymphoma recurrence/progression at this time.   -No indication for further treatment at this time. Will continue watching with labs and scans every 6 months.  -Pt completed chemotherapy in November 2017. Will move to annual visits five years post-treatment.  -Will see back in 6 months with labs   #2 s/p neutropenia post Rituxan  delayed neutropenia- remains resolved.  #3  left sacral ala and S1 fracture. L5-S1 and S1-S2 left-sided nerve root compression -resolving symptoms.  #4 history of peripheral arterial disease  #5 chemotherapy related neuropathy - nearly resolved.  #6 Hypertension, dyslipidemia, peripheral arterial disease -Continue management as per primary care physician.  #7 Ex-smoker with 80-pack-year history of smoking.  PLAN:  -maintain active lifestyle   FOLLOW UP: RTC with Dr Irene Limbo with labs in 6 months   The total time spent in the appt was 86mnutes and more than 50% was on counseling and direct patient cares.  All of the patient's questions were answered with apparent satisfaction. The patient knows to call the clinic with any problems, questions or concerns.    GSullivan LoneMD MOsceolaAAHIVMS SSt. Alexius [facility] CampusCNorthwest Specialty HospitalHematology/Oncology Physician [facility] (Office):       [phone](Work cell):  [phone](Fax):           [phone] I, JYevette Edwards am acting as a scribe for Dr. GSullivan Lone   .I have reviewed the above documentation for accuracy and completeness, and I agree with the above. .Brunetta GeneraMD

## 2020-02-12 DIAGNOSIS — Z1211 Encounter for screening for malignant neoplasm of colon: Secondary | ICD-10-CM | POA: Diagnosis not present

## 2020-02-25 ENCOUNTER — Other Ambulatory Visit: Payer: Self-pay | Admitting: Cardiology

## 2020-02-25 DIAGNOSIS — I739 Peripheral vascular disease, unspecified: Secondary | ICD-10-CM

## 2020-02-25 DIAGNOSIS — C8378 Burkitt lymphoma, lymph nodes of multiple sites: Secondary | ICD-10-CM

## 2020-03-06 DIAGNOSIS — N401 Enlarged prostate with lower urinary tract symptoms: Secondary | ICD-10-CM | POA: Diagnosis not present

## 2020-03-06 DIAGNOSIS — R319 Hematuria, unspecified: Secondary | ICD-10-CM | POA: Diagnosis not present

## 2020-03-25 ENCOUNTER — Other Ambulatory Visit: Payer: Self-pay | Admitting: Cardiology

## 2020-03-25 DIAGNOSIS — I739 Peripheral vascular disease, unspecified: Secondary | ICD-10-CM

## 2020-03-25 DIAGNOSIS — C8378 Burkitt lymphoma, lymph nodes of multiple sites: Secondary | ICD-10-CM

## 2020-04-14 ENCOUNTER — Other Ambulatory Visit: Payer: Self-pay | Admitting: Cardiology

## 2020-04-14 DIAGNOSIS — C8378 Burkitt lymphoma, lymph nodes of multiple sites: Secondary | ICD-10-CM

## 2020-04-14 DIAGNOSIS — I739 Peripheral vascular disease, unspecified: Secondary | ICD-10-CM

## 2020-05-12 ENCOUNTER — Other Ambulatory Visit: Payer: Self-pay | Admitting: Cardiology

## 2020-05-12 DIAGNOSIS — C8378 Burkitt lymphoma, lymph nodes of multiple sites: Secondary | ICD-10-CM

## 2020-05-12 DIAGNOSIS — I739 Peripheral vascular disease, unspecified: Secondary | ICD-10-CM

## 2020-05-12 NOTE — Telephone Encounter (Signed)
Rx refill sent to pharmacy. 

## 2020-06-09 ENCOUNTER — Other Ambulatory Visit: Payer: Self-pay | Admitting: Cardiology

## 2020-06-09 DIAGNOSIS — I739 Peripheral vascular disease, unspecified: Secondary | ICD-10-CM

## 2020-06-09 DIAGNOSIS — C8378 Burkitt lymphoma, lymph nodes of multiple sites: Secondary | ICD-10-CM

## 2020-06-09 NOTE — Telephone Encounter (Signed)
All script sent and advise to arrange an appt asap. 1rst attempt

## 2020-06-18 ENCOUNTER — Other Ambulatory Visit: Payer: Self-pay

## 2020-06-18 ENCOUNTER — Inpatient Hospital Stay (HOSPITAL_BASED_OUTPATIENT_CLINIC_OR_DEPARTMENT_OTHER): Payer: PPO | Admitting: Hematology

## 2020-06-18 ENCOUNTER — Inpatient Hospital Stay: Payer: PPO | Attending: Hematology

## 2020-06-18 VITALS — BP 144/62 | HR 73 | Temp 97.8°F | Resp 18 | Ht 73.0 in | Wt 208.2 lb

## 2020-06-18 DIAGNOSIS — Z79899 Other long term (current) drug therapy: Secondary | ICD-10-CM | POA: Diagnosis not present

## 2020-06-18 DIAGNOSIS — C8378 Burkitt lymphoma, lymph nodes of multiple sites: Secondary | ICD-10-CM

## 2020-06-18 DIAGNOSIS — E785 Hyperlipidemia, unspecified: Secondary | ICD-10-CM | POA: Insufficient documentation

## 2020-06-18 DIAGNOSIS — Z9221 Personal history of antineoplastic chemotherapy: Secondary | ICD-10-CM | POA: Insufficient documentation

## 2020-06-18 DIAGNOSIS — I739 Peripheral vascular disease, unspecified: Secondary | ICD-10-CM | POA: Diagnosis not present

## 2020-06-18 DIAGNOSIS — Z87891 Personal history of nicotine dependence: Secondary | ICD-10-CM | POA: Diagnosis not present

## 2020-06-18 DIAGNOSIS — C8370 Burkitt lymphoma, unspecified site: Secondary | ICD-10-CM | POA: Diagnosis not present

## 2020-06-18 DIAGNOSIS — Z7901 Long term (current) use of anticoagulants: Secondary | ICD-10-CM | POA: Insufficient documentation

## 2020-06-18 DIAGNOSIS — I10 Essential (primary) hypertension: Secondary | ICD-10-CM | POA: Insufficient documentation

## 2020-06-18 LAB — CMP (CANCER CENTER ONLY)
ALT: 26 U/L (ref 0–44)
AST: 25 U/L (ref 15–41)
Albumin: 4.2 g/dL (ref 3.5–5.0)
Alkaline Phosphatase: 86 U/L (ref 38–126)
Anion gap: 10 (ref 5–15)
BUN: 12 mg/dL (ref 8–23)
CO2: 24 mmol/L (ref 22–32)
Calcium: 9.4 mg/dL (ref 8.9–10.3)
Chloride: 106 mmol/L (ref 98–111)
Creatinine: 0.82 mg/dL (ref 0.61–1.24)
GFR, Estimated: >60 mL/min (ref >60)
Glucose, Bld: 109 mg/dL — ABNORMAL HIGH (ref 70–99)
Potassium: 4.6 mmol/L (ref 3.5–5.1)
Sodium: 140 mmol/L (ref 135–145)
Total Bilirubin: 0.6 mg/dL (ref 0.3–1.2)
Total Protein: 7.2 g/dL (ref 6.5–8.1)

## 2020-06-18 LAB — CBC WITH DIFFERENTIAL/PLATELET
Abs Immature Granulocytes: 0.01 10*3/uL (ref 0.00–0.07)
Basophils Absolute: 0.1 10*3/uL (ref 0.0–0.1)
Basophils Relative: 1 %
Eosinophils Absolute: 0.1 10*3/uL (ref 0.0–0.5)
Eosinophils Relative: 2 %
HCT: 39.7 % (ref 39.0–52.0)
Hemoglobin: 13.1 g/dL (ref 13.0–17.0)
Immature Granulocytes: 0 %
Lymphocytes Relative: 23 %
Lymphs Abs: 1.6 10*3/uL (ref 0.7–4.0)
MCH: 28.4 pg (ref 26.0–34.0)
MCHC: 33 g/dL (ref 30.0–36.0)
MCV: 86.1 fL (ref 80.0–100.0)
Monocytes Absolute: 0.8 10*3/uL (ref 0.1–1.0)
Monocytes Relative: 11 %
Neutro Abs: 4.2 10*3/uL (ref 1.7–7.7)
Neutrophils Relative %: 63 %
Platelets: 225 10*3/uL (ref 150–400)
RBC: 4.61 MIL/uL (ref 4.22–5.81)
RDW: 13.2 % (ref 11.5–15.5)
WBC: 6.8 10*3/uL (ref 4.0–10.5)
nRBC: 0 % (ref 0.0–0.2)

## 2020-06-18 LAB — LACTATE DEHYDROGENASE: LDH: 151 U/L (ref 98–192)

## 2020-06-18 NOTE — Progress Notes (Signed)
Erik Vaughn    HEMATOLOGY/ONCOLOGY CLINIC NOTE  Date of Service: 06/18/20      Patient Care Team: Ernestene Kiel, MD as PCP - General (Internal Medicine) [name], MD as Consulting Physician (Orthopedic Surgery)  CHIEF COMPLAINTS/PURPOSE OF CONSULTATION:  F/u for Burkitt's lymphoma  DIAGNOSIS  Burkitts Lymphoma currently in remission  Stage IVB Burkitt's lymphoma with t(8;14) translocation diagnosed 12/23/2015. This appears to be involving the sacrum causing sacral and root involvement with possible neurogenic bladder. PET CT scan as noted above shows extensive involvement with lymphoma. CSF negative for involvement. Bone marrow biopsy negative for involvement by lymphoma.  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 PET CT scan as noted above shows extensive involvement with lymphoma. CSF negative for involvement. Bone marrow biopsy negative for involvement by lymphoma. PET/CT scan after 3 cycles of EPOCH-R  on 03/02/2016  showed significant interval metabolic response.  Patient has completed cycle 6 of EPOCH-R and tolerated it well with no prohibitive toxicities. Grade 1 fatigue.  He has had some episodes of fevers including a periodontal abscess,  non-neutropenic fevers - unclear etiology, recent Escherichia coli urinary tract infection x 2 and Clostridium difficile diarrhea.  PET CT scan done after completion of this planned chemotherapy shows no clear evidence of residual lymphoma . Some borderline FDG avidity in the left sacral ala ?pathologic fracture vs residual lymphoma  Patient was evaluated by radiation oncology - no RT recommended to sacral lesion.  CURRENT TREATMENT -Active surveillance  Previous treatment The patient has completed 6 cycles of EPOCH -R and IT methotrexate x 4 doses starting with cycle 3 for CNS prophylaxis   HISTORY OF  PRESENTING ILLNESS:  Please see my previous note for details  INTERVAL HISTORY:   [name] is here for a follow-up of his Burkitts lymphoma. The patient's last visit with [place] was on 12/18/2019. The pt reports that he is doing well overall.  The pt reports that everything has been very good recently. He notes that he has been very active, walking his dog daily. The pt notes no new concerns.  Lab results today 06/18/2020 of CBC w/diff and CMP is as follows: all values are WNL except for Glucose at 109. 06/18/2020 LDH is in progress.  On review of systems, pt denies fevers, chills, night sweats, n/v/d, new bone pains, new lumps/bumps, abdominal pain, testicular pain/swelling, new headaches and any other symptoms.   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   He smoked about 2 packs a day for 40 years.  SURGICAL HISTORY:  #1 left submandibular salivary gland benign tumor excision #2 port placement - 12/31/2015  SOCIAL HISTORY: Social History   Socioeconomic History  . Marital status: Married    Spouse name: Not on file  . Number of children: Not on file  . Years of education: Not on file  . Highest education level: Not on file  Occupational History  . Occupation: Geneticist, molecular  Tobacco Use  . Smoking status: Former Smoker    Packs/day: 2.00    Years: 16.00    Pack years: 32.00    Quit date: 12/22/2005    Years since quitting: 14.4  . Smokeless tobacco: Current User    Types: Chew  Vaping Use  . Vaping Use: Never  used  Substance and Sexual Activity  . Alcohol use: No  . Drug use: No  . Sexual activity: Never  Other Topics Concern  . Not on file  Social History Narrative  . Not on file   Social Determinants of Health   Financial Resource Strain: Not on file  Food Insecurity:  Not on file  Transportation Needs: Not on file  Physical Activity: Not on file  Stress: Not on file  Social Connections: Not on file  Intimate Partner Violence: Not on file   smoked 2 packs of cigarettes per day for 40 years quit about 8-9 years ago. Denies significant alcohol use. 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  FAMILY HISTORY:  Sister had breast cancer at age 20 years. Unknown if she had genetic testing One maternal uncle had melanoma Second maternal uncle had pancreatic cancer under the age of 35yrPaternal uncle with prostate cancer Dad had prostate cancer at age 2221years   ALLERGIES:  has No Known Allergies.  MEDICATIONS:  Current Outpatient Medications  Medication Sig Dispense Refill  . amLODipine (NORVASC) 10 MG tablet TAKE 1 TABLET BY MOUTH EVERY EVENING 15 tablet 0  . b complex vitamins tablet Take 1 tablet by mouth daily.    . clopidogrel (PLAVIX) 75 MG tablet TAKE 1 TABLET BY MOUTH EVERY EVENING 15 tablet 0  . finasteride (PROSCAR) 5 MG tablet Take 5 mg by mouth daily.    .Erik Kitchenlisinopril (ZESTRIL) 20 MG tablet TAKE 1 TABLET BY MOUTH EVERY EVENING 15 tablet 0  . rosuvastatin (CRESTOR) 10 MG tablet TAKE 1 TABLET BY MOUTH DAILY 15 tablet 0  . tamsulosin (FLOMAX) 0.4 MG CAPS capsule Take 0.4 mg by mouth daily.     No current facility-administered medications for this visit.    REVIEW OF SYSTEMS:   10 Point review of Systems was done is negative except as noted above.   PHYSICAL EXAMINATION: ECOG PERFORMANCE STATUS: 1 - Symptomatic but completely ambulatory   GENERAL:alert, in no acute distress and comfortable SKIN: no acute rashes, no significant lesions EYES: conjunctiva are pink and non-injected, sclera anicteric OROPHARYNX: MMM, no exudates, no oropharyngeal erythema or  ulceration NECK: supple, no JVD LYMPH:  no palpable lymphadenopathy in the cervical, axillary or inguinal regions LUNGS: clear to auscultation b/l with normal respiratory effort HEART: regular rate & rhythm ABDOMEN:  normoactive bowel sounds , non tender, not distended. Extremity: no pedal edema PSYCH: alert & oriented x 3 with fluent speech NEURO: no focal motor/sensory deficits   LABORATORY DATA:  I have reviewed the data as listed . CBC Latest Ref Rng & Units 12/18/2019 06/20/2019 12/15/2018  WBC 4.0 - 10.5 K/uL 6.6 6.6 6.5  Hemoglobin 13.0 - 17.0 g/dL 12.5(L) 13.1 13.5  Hematocrit 39.0 - 52.0 % 38.9(L) 39.6 40.3  Platelets 150 - 400 K/uL 218 216 213   HGB 13.6  CBC    Component Value Date/Time   WBC 6.6 12/18/2019 1303   RBC 4.49 12/18/2019 1303   HGB 12.5 (L) 12/18/2019 1303   HGB 13.5 12/15/2018 1019   HGB 13.0 05/20/2017 1012   HCT 38.9 (L) 12/18/2019 1303   HCT 39.4 05/20/2017 1012   PLT 218 12/18/2019 1303   PLT 213 12/15/2018 1019   PLT 207 05/20/2017 1012   MCV 86.6 12/18/2019 1303  MCV 86.4 05/20/2017 1012   MCH 27.8 12/18/2019 1303   MCHC 32.1 12/18/2019 1303   RDW 13.6 12/18/2019 1303   RDW 13.7 05/20/2017 1012   LYMPHSABS 1.8 12/18/2019 1303   LYMPHSABS 1.4 05/20/2017 1012   MONOABS 0.8 12/18/2019 1303   MONOABS 0.6 05/20/2017 1012   EOSABS 0.2 12/18/2019 1303   EOSABS 0.2 05/20/2017 1012   BASOSABS 0.0 12/18/2019 1303   BASOSABS 0.0 05/20/2017 1012    ANC 100  CMP Latest Ref Rng & Units 12/18/2019 06/20/2019 12/15/2018  Glucose 70 - 99 mg/dL 122(H) 141(H) 113(H)  BUN 8 - 23 mg/dL 17 10 15   Creatinine 0.61 - 1.24 mg/dL 0.84 0.80 0.81  Sodium 135 - 145 mmol/L 140 140 139  Potassium 3.5 - 5.1 mmol/L 4.3 4.4 4.2  Chloride 98 - 111 mmol/L 106 106 106  CO2 22 - 32 mmol/L 25 25 23   Calcium 8.9 - 10.3 mg/dL 9.6 8.7(L) 9.1  Total Protein 6.5 - 8.1 g/dL 7.0 6.6 7.1  Total Bilirubin 0.3 - 1.2 mg/dL 0.6 0.6 0.6  Alkaline Phos 38 - 126 U/L 91 90 94  AST  15 - 41 U/L 23 24 28   ALT 0 - 44 U/L 26 28 35  . Lab Results  Component Value Date   LDH 147 12/18/2019    RADIOGRAPHIC STUDIES: I have personally reviewed the radiological images as listed and agreed with the findings in the report.  .No results found.  ASSESSMENT & PLAN:   69 y.o. 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  Patient was evaluated by radiation oncology and no ISRT was recommended. His LDH level appears to have been increased and he had a repeat PET/CT scan on 10/29/2015 that shows no overt evidence of lymphoma recurrence.  PLAN: -Discussed pt labwork today,06/18/2020; blood counts and chemistries normal -Advised pt the chances of recurrence is decreasing with time. -Advised pt that he continues to be in remission. -The pt shows no lab or clinical evidence of Burkitts lymphoma recurrence/progression at this time.  -Pt completed chemotherapy in November 2017. Will move to annual visits five years post-treatment.  -Will see back in 6 months with labs.  #2 s/p neutropenia post Rituxan delayed neutropenia- remains resolved.  #3 s/p  left sacral ala and S1 fracture. L5-S1 and S1-S2 left-sided nerve root compression -resolved  #4 history of peripheral arterial disease  #5 chemotherapy related neuropathy - nearly resolved.  #6 Hypertension, dyslipidemia, peripheral arterial disease -Continue management as per primary care physician.  #7 Ex-smoker with 80-pack-year history of smoking.  PLAN:  -maintain active lifestyle   FOLLOW UP: RTC with Dr Irene Limbo with labs in 6 months   The total time spent in the appointment was 20 minutes and more than 50% was on counseling and direct patient cares.  All of the patient's questions were answered with apparent satisfaction. The patient knows to call the clinic  with any problems, questions or concerns.    Sullivan Lone MD [place] AAHIVMS [facility] Hematology/Oncology Physician [facility]  (Office):       [phone] (Work cell):  [phone] (Fax):           [phone]  Note completed by Reinaldo Raddle as medical scribe.  .I have reviewed the above documentation for accuracy and completeness, and I agree with the above. Brunetta Genera  MD

## 2020-07-01 ENCOUNTER — Other Ambulatory Visit: Payer: Self-pay | Admitting: Cardiology

## 2020-07-01 DIAGNOSIS — I739 Peripheral vascular disease, unspecified: Secondary | ICD-10-CM

## 2020-07-01 DIAGNOSIS — C8378 Burkitt lymphoma, lymph nodes of multiple sites: Secondary | ICD-10-CM

## 2020-07-10 ENCOUNTER — Other Ambulatory Visit: Payer: Self-pay | Admitting: Cardiology

## 2020-07-10 DIAGNOSIS — I739 Peripheral vascular disease, unspecified: Secondary | ICD-10-CM

## 2020-07-10 DIAGNOSIS — C8378 Burkitt lymphoma, lymph nodes of multiple sites: Secondary | ICD-10-CM

## 2020-07-19 DIAGNOSIS — B029 Zoster without complications: Secondary | ICD-10-CM | POA: Diagnosis not present

## 2020-07-21 ENCOUNTER — Other Ambulatory Visit: Payer: Self-pay | Admitting: Cardiology

## 2020-07-21 DIAGNOSIS — C8378 Burkitt lymphoma, lymph nodes of multiple sites: Secondary | ICD-10-CM

## 2020-07-21 DIAGNOSIS — I739 Peripheral vascular disease, unspecified: Secondary | ICD-10-CM

## 2020-07-21 NOTE — Telephone Encounter (Signed)
Refill sent to pharmacy.   

## 2020-08-15 ENCOUNTER — Other Ambulatory Visit: Payer: Self-pay

## 2020-08-15 DIAGNOSIS — C801 Malignant (primary) neoplasm, unspecified: Secondary | ICD-10-CM | POA: Insufficient documentation

## 2020-08-15 DIAGNOSIS — I10 Essential (primary) hypertension: Secondary | ICD-10-CM | POA: Insufficient documentation

## 2020-08-15 DIAGNOSIS — I739 Peripheral vascular disease, unspecified: Secondary | ICD-10-CM | POA: Insufficient documentation

## 2020-08-18 ENCOUNTER — Encounter: Payer: Self-pay | Admitting: Cardiology

## 2020-08-18 ENCOUNTER — Ambulatory Visit: Payer: PPO | Admitting: Cardiology

## 2020-08-18 ENCOUNTER — Other Ambulatory Visit: Payer: Self-pay

## 2020-08-18 VITALS — BP 128/64 | HR 75 | Ht 72.0 in | Wt 204.0 lb

## 2020-08-18 DIAGNOSIS — I739 Peripheral vascular disease, unspecified: Secondary | ICD-10-CM | POA: Diagnosis not present

## 2020-08-18 DIAGNOSIS — I10 Essential (primary) hypertension: Secondary | ICD-10-CM | POA: Diagnosis not present

## 2020-08-18 DIAGNOSIS — C8590 Non-Hodgkin lymphoma, unspecified, unspecified site: Secondary | ICD-10-CM

## 2020-08-18 DIAGNOSIS — E785 Hyperlipidemia, unspecified: Secondary | ICD-10-CM | POA: Diagnosis not present

## 2020-08-18 DIAGNOSIS — C859A Non-Hodgkin lymphoma, unspecified, in remission: Secondary | ICD-10-CM

## 2020-08-18 NOTE — Patient Instructions (Signed)
Medication Instructions:  Your physician recommends that you continue on your current medications as directed. Please refer to the Current Medication list given to you today.  *If you need a refill on your cardiac medications before your next appointment, please call your pharmacy*   Lab Work: Your physician recommends that you return for lab work today: lipid  If you have labs (blood work) drawn today and your tests are completely normal, you will receive your results only by: [name] MyChart Message (if you have MyChart) OR . A paper copy in the mail If you have any lab test that is abnormal or we need to change your treatment, we will call you to review the results.   Testing/Procedures: Your physician has requested that you have a carotid duplex. This test is an ultrasound of the carotid arteries in your neck. It looks at blood flow through these arteries that supply the brain with blood. Allow one hour for this exam. There are no restrictions or special instructions.     Follow-Up: At [facility], you and your health needs are our priority.  As part of our continuing mission to provide you with exceptional heart care, we have created designated Provider Care Teams.  These Care Teams include your primary Cardiologist (physician) and Advanced Practice Providers (APPs -  Physician Assistants and Nurse Practitioners) who all work together to provide you with the care you need, when you need it.  We recommend signing up for the patient portal called "MyChart".  Sign up information is provided on this After Visit Summary.  MyChart is used to connect with patients for Virtual Visits (Telemedicine).  Patients are able to view lab/test results, encounter notes, upcoming appointments, etc.  Non-urgent messages can be sent to your provider as well.   To learn more about what you can do with MyChart, go to [email or web address].    Your next appointment:   6 month(s)  The format for your next  appointment:   In Person  Provider:   Jenne Campus, MD   Other Instructions

## 2020-08-18 NOTE — Progress Notes (Signed)
Cardiology Office Note:    Date:  08/18/2020   ID:  SANDRA TELLEFSEN, DOB 1951/02/12, MRN [id number]  PCP:  Ernestene Kiel, MD  Cardiologist:  Jenne Campus, MD    Referring MD: Ernestene Kiel, MD   Chief Complaint  Patient presents with  . Follow-up  . Medication Refill    History of Present Illness:    Erik Vaughn is a 70 y.o. male with past medical history significant for peripheral vascular disease.  Cardiac ultrasounds done in 2020 showed noncritical lesions, he also had duplex done of lower extremities which did show some significant stenosis.  He does have claudications but overall exercise and regular basis improving from that aspect.  He does have history of lymphoma that being under control.  Essential hypertension, dyslipidemia.  Past Medical History:  Diagnosis Date  . Acute respiratory failure with hypoxia ([place]) 02/07/2016  . Burkitt lymphoma of lymph nodes of multiple sites ([place]) 03/08/2016  . Burkitt's lymphoma of lymph nodes of multiple regions ([place]) 12/25/2015  . Cancer ([place])    Burkett Lymphoma  . Claudication ([place]) 04/28/2015  . Dyslipidemia 04/28/2015  . Encounter for antineoplastic chemotherapy   . Essential hypertension 04/28/2015  . Hypertension   . Murmur 02/07/2016  . Neoplasm related pain   . PAD (peripheral artery disease) ([facility]) left leg    Past Surgical History:  Procedure Laterality Date  . IR GENERIC HISTORICAL  12/31/2015   IR FLUORO GUIDE CV LINE RIGHT 12/31/2015 Arne Cleveland, MD [facility]INTER[facility]  . IR GENERIC HISTORICAL  12/31/2015   IR US GUIDE VASC ACCESS RIGHT 12/31/2015 Arne Cleveland, MD [facility]INTER[facility]  . IR GENERIC HISTORICAL  07/20/2016   IR REMOVAL TUN ACCESS W/ PORT W/O FL MOD SED 07/20/2016 Jacqulynn Cadet, MD [facility]INTER[facility]  . left salivary Left    removed    Current Medications: Current Meds  Medication Sig  . amLODipine (NORVASC) 10 MG tablet TAKE 1 TABLET BY MOUTH EVERY EVENING  . b complex vitamins tablet Take 1 tablet by  mouth daily.  . clopidogrel (PLAVIX) 75 MG tablet TAKE 1 TABLET BY MOUTH EVERY EVENING  . lisinopril (ZESTRIL) 20 MG tablet TAKE 1 TABLET BY MOUTH EVERY EVENING  . rosuvastatin (CRESTOR) 10 MG tablet TAKE 1 TABLET BY MOUTH DAILY  . tamsulosin (FLOMAX) 0.4 MG CAPS capsule Take 0.4 mg by mouth daily.     Allergies:   Patient has no known allergies.   Social History   Socioeconomic History  . Marital status: Married    Spouse name: Not on file  . Number of children: Not on file  . Years of education: Not on file  . Highest education level: Not on file  Occupational History  . Occupation: Geneticist, molecular  Tobacco Use  . Smoking status: Former Smoker    Packs/day: 2.00    Years: 16.00    Pack years: 32.00    Quit date: 12/22/2005    Years since quitting: 14.6  . Smokeless tobacco: Current User    Types: Chew  Vaping Use  . Vaping Use: Never used  Substance and Sexual Activity  . Alcohol use: No  . Drug use: No  . Sexual activity: Never  Other Topics Concern  . Not on file  Social History Narrative  . Not on file   Social Determinants of Health   Financial Resource Strain: Not on file  Food Insecurity: Not on file  Transportation Needs: Not on file  Physical Activity: Not on file  Stress: Not on file  Social Connections: Not on file     Family History: The patient's family history includes Alzheimer's disease (age of onset: 96) in his father. ROS:   Please see the history of present illness.    All 14 point review of systems negative except as described per history of present illness  EKGs/Labs/Other Studies Reviewed:      Recent Labs: 06/18/2020: ALT 26; BUN 12; Creatinine 0.82; Hemoglobin 13.1; Platelets 225; Potassium 4.6; Sodium 140  Recent Lipid Panel    Component Value Date/Time   CHOL 87 (L) 05/08/2019 0851   TRIG 109 05/08/2019 0851   HDL 36 (L) 05/08/2019 0851   CHOLHDL 2.4 05/08/2019 0851   LDLCALC 31 05/08/2019 0851    Physical Exam:    VS:   BP 128/64 (BP Location: Right Arm, Patient Position: Sitting)   Pulse 75   Ht 6' (1.829 m)   Wt 204 lb (92.5 kg)   SpO2 95%   BMI 27.67 kg/m     Wt Readings from Last 3 Encounters:  08/18/20 204 lb (92.5 kg)  06/18/20 208 lb 3.2 oz (94.4 kg)  12/18/19 203 lb 6.4 oz (92.3 kg)     GEN:  Well nourished, well developed in no acute distress HEENT: Normal NECK: No JVD; No carotid bruits LYMPHATICS: No lymphadenopathy CARDIAC: RRR, no murmurs, no rubs, no gallops RESPIRATORY:  Clear to auscultation without rales, wheezing or rhonchi  ABDOMEN: Soft, non-tender, non-distended MUSCULOSKELETAL:  No edema; No deformity  SKIN: Warm and dry LOWER EXTREMITIES: no swelling NEUROLOGIC:  Alert and oriented x 3 PSYCHIATRIC:  Normal affect   ASSESSMENT:    1. Dyslipidemia   2. Peripheral vascular disease ([place])   3. Essential hypertension   4. Lymphoma in remission ([place])   5. Claudication [facility])    PLAN:    In order of problems listed above:  1. Dyslipidemia we will ask him to have fasting lipid profile done today we will continue statin. 2. Peripheral vascular disease he is doing well from claudication point review but carotic ultrasound need to be checked. 3. Essential hypertension blood pressure well controlled continue present management. 4. Lymphoma will be followed by entire medicine team.   Medication Adjustments/Labs and Tests Ordered: Current medicines are reviewed at length with the patient today.  Concerns regarding medicines are outlined above.  Orders Placed This Encounter  Procedures  . Lipid panel  . EKG 12-Lead  . VAS US CAROTID   Medication changes: No orders of the defined types were placed in this encounter.   Signed, Park Liter, MD, [facility] 08/18/2020 11:42 AM    [place]

## 2020-08-19 LAB — LIPID PANEL
Chol/HDL Ratio: 2.9 ratio (ref 0.0–5.0)
Cholesterol, Total: 86 mg/dL — ABNORMAL LOW (ref 100–199)
HDL: 30 mg/dL — ABNORMAL LOW (ref >39)
LDL Chol Calc (NIH): 35 mg/dL (ref 0–99)
Triglycerides: 112 mg/dL (ref 0–149)
VLDL Cholesterol Cal: 21 mg/dL (ref 5–40)

## 2020-08-21 ENCOUNTER — Telehealth: Payer: Self-pay | Admitting: Cardiology

## 2020-08-21 DIAGNOSIS — I739 Peripheral vascular disease, unspecified: Secondary | ICD-10-CM

## 2020-08-21 DIAGNOSIS — C8378 Burkitt lymphoma, lymph nodes of multiple sites: Secondary | ICD-10-CM

## 2020-08-21 MED ORDER — CLOPIDOGREL BISULFATE 75 MG PO TABS: 75.0000 mg | ORAL_TABLET | Freq: Every evening | ORAL | 1 refills | Status: DC

## 2020-08-21 MED ORDER — AMLODIPINE BESYLATE 10 MG PO TABS: 10.0000 mg | ORAL_TABLET | Freq: Every evening | ORAL | 1 refills | Status: DC

## 2020-08-21 MED ORDER — ROSUVASTATIN CALCIUM 10 MG PO TABS: 10.0000 mg | ORAL_TABLET | Freq: Every day | ORAL | 1 refills | Status: DC

## 2020-08-21 MED ORDER — LISINOPRIL 20 MG PO TABS: 20.0000 mg | ORAL_TABLET | Freq: Every evening | ORAL | 1 refills | Status: DC

## 2020-08-21 NOTE — Telephone Encounter (Signed)
All medications filled.

## 2020-08-21 NOTE — Telephone Encounter (Signed)
   *  STAT* If patient is at the pharmacy, call can be transferred to refill team.   1. Which medications need to be refilled? (please list name of each medication and dose if known)   amLODipine (NORVASC) 10 MG tablet    clopidogrel (PLAVIX) 75 MG tablet    lisinopril (ZESTRIL) 20 MG tablet    rosuvastatin (CRESTOR) 10 MG tablet   2. Which pharmacy/location (including street and city if local pharmacy) is medication to be sent to? [place], [place]  3. Do they need a 30 day or 90 day supply? 90 days

## 2020-09-04 DIAGNOSIS — N401 Enlarged prostate with lower urinary tract symptoms: Secondary | ICD-10-CM | POA: Diagnosis not present

## 2020-09-04 DIAGNOSIS — R319 Hematuria, unspecified: Secondary | ICD-10-CM | POA: Diagnosis not present

## 2020-09-04 DIAGNOSIS — Z8042 Family history of malignant neoplasm of prostate: Secondary | ICD-10-CM | POA: Diagnosis not present

## 2020-09-09 ENCOUNTER — Other Ambulatory Visit: Payer: Self-pay

## 2020-09-09 ENCOUNTER — Ambulatory Visit (INDEPENDENT_AMBULATORY_CARE_PROVIDER_SITE_OTHER): Payer: PPO

## 2020-09-09 DIAGNOSIS — I6523 Occlusion and stenosis of bilateral carotid arteries: Secondary | ICD-10-CM

## 2020-09-09 DIAGNOSIS — E785 Hyperlipidemia, unspecified: Secondary | ICD-10-CM | POA: Diagnosis not present

## 2020-09-09 NOTE — Progress Notes (Signed)
Complete echocardiogram performed.  Jimmy Danilynn Jemison RDCS, RVT  

## 2020-09-09 NOTE — Progress Notes (Addendum)
Carotid duplex exam has been performed.  Jimmy Sumiko Ceasar RDCS, RVT 

## 2020-09-11 ENCOUNTER — Telehealth: Payer: Self-pay | Admitting: Cardiology

## 2020-09-11 NOTE — Telephone Encounter (Signed)
Patient was calling to discuss results from his  Carotid Ultrasound done 09/09/20.

## 2020-09-12 NOTE — Telephone Encounter (Signed)
Left a message to return my call.

## 2020-09-17 NOTE — Telephone Encounter (Signed)
Left a message to return my call.

## 2020-09-17 NOTE — Telephone Encounter (Signed)
Patient notified of test results 

## 2020-12-08 DIAGNOSIS — R972 Elevated prostate specific antigen [PSA]: Secondary | ICD-10-CM | POA: Diagnosis not present

## 2020-12-16 ENCOUNTER — Telehealth: Payer: Self-pay | Admitting: Hematology

## 2020-12-16 ENCOUNTER — Inpatient Hospital Stay: Payer: PPO

## 2020-12-16 ENCOUNTER — Inpatient Hospital Stay: Payer: PPO | Admitting: Hematology

## 2020-12-16 NOTE — Telephone Encounter (Signed)
Pt called in to r/s appt. R/s appt, pt aware.

## 2021-01-01 NOTE — Progress Notes (Signed)
Erik Vaughn    HEMATOLOGY/ONCOLOGY CLINIC NOTE  Date of Service: .01/02/2021  Patient Care Team: Erik Kiel, MD as PCP - General (Internal Medicine) Erik Day, MD as Consulting Physician (Orthopedic Surgery)  CHIEF COMPLAINTS/PURPOSE OF CONSULTATION:  F/u for Burkitt's lymphoma  DIAGNOSIS  Burkitts Lymphoma currently in remission  Stage IVB Burkitt's lymphoma with t(8;14) translocation diagnosed 12/23/2015. This appears to be involving the sacrum causing sacral and root involvement with possible neurogenic bladder. PET CT scan as noted above shows extensive involvement with lymphoma. CSF negative for involvement. Bone marrow biopsy negative for involvement by lymphoma.  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 PET CT scan as noted above shows extensive involvement with lymphoma. CSF negative for involvement. Bone marrow biopsy negative for involvement by lymphoma. PET/CT scan after 3 cycles of EPOCH-R  on 03/02/2016  showed significant interval metabolic response.  Patient has completed cycle 6 of EPOCH-R and tolerated it well with no prohibitive toxicities. Grade 1 fatigue.  He has had some episodes of fevers including a periodontal abscess,  non-neutropenic fevers - unclear etiology, recent Escherichia coli urinary tract infection x 2 and Clostridium difficile diarrhea.  PET CT scan done after completion of this planned chemotherapy shows no clear evidence of residual lymphoma . Some borderline FDG avidity in the left sacral ala ?pathologic fracture vs residual lymphoma  Patient was evaluated by radiation oncology - no RT recommended to sacral lesion.  CURRENT TREATMENT -Active surveillance  Previous treatment The patient has completed 6 cycles of EPOCH -R and IT methotrexate x 4 doses starting with cycle 3 for CNS prophylaxis   HISTORY OF  PRESENTING ILLNESS:  Please see my previous note for details  INTERVAL HISTORY:   Erik Vaughn is here for a follow-up of his Burkitts lymphoma. The patient's last visit with [place] was on 06/18/2020. The pt reports that he is doing well overall.  The pt reports no acute new symptoms. No fevers, chills, night sweats, weight loss. No new lumps or bumps.   Lab results today 01/02/2021 of CBC w/diff and CMP - unremarkable. LDH wnl 147  On review of systems, pt reports no other new concerns.   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   He smoked about 2 packs a Vaughn for 40 years.  SURGICAL HISTORY:  #1 left submandibular salivary gland benign tumor excision #2 port placement - 12/31/2015  SOCIAL HISTORY: Social History   Socioeconomic History   Marital status: Married    Spouse name: Not on file   Number of children: Not on file   Years of education: Not on file   Highest education level: Not on file  Occupational History   Occupation: meter specialist  Tobacco Use   Smoking status: Former    Packs/Vaughn: 2.00    Years: 16.00    Pack years: 32.00    Types: Cigarettes    Quit date: 12/22/2005    Years since quitting: 15.0   Smokeless tobacco: Current    Types: Chew  Vaping Use   Vaping Use: Never used  Substance and Sexual Activity   Alcohol use: No   Drug use: No   Sexual activity: Never  Other Topics Concern   Not on file  Social History Narrative   Not  on file   Social Determinants of Health   Financial Resource Strain: Not on file  Food Insecurity: Not on file  Transportation Needs: Not on file  Physical Activity: Not on file  Stress: Not on file  Social Connections: Not on file  Intimate Partner Violence: Not on file   smoked 2 packs of cigarettes per Vaughn for 40 years quit  about 8-9 years ago. Denies significant alcohol use. 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  FAMILY HISTORY:  Sister had breast cancer at age 72 years. Unknown if she had genetic testing One maternal uncle had melanoma Second maternal uncle had pancreatic cancer under the age of 36yrPaternal uncle with prostate cancer Dad had prostate cancer at age 595years   ALLERGIES:  has No Known Allergies.  MEDICATIONS:  Current Outpatient Medications  Medication Sig Dispense Refill   amLODipine (NORVASC) 10 MG tablet Take 1 tablet (10 mg total) by mouth every evening. 90 tablet 1   b complex vitamins tablet Take 1 tablet by mouth daily.     clopidogrel (PLAVIX) 75 MG tablet Take 1 tablet (75 mg total) by mouth every evening. 90 tablet 1   lisinopril (ZESTRIL) 20 MG tablet Take 1 tablet (20 mg total) by mouth every evening. 90 tablet 1   rosuvastatin (CRESTOR) 10 MG tablet Take 1 tablet (10 mg total) by mouth daily. 90 tablet 1   tamsulosin (FLOMAX) 0.4 MG CAPS capsule Take 0.4 mg by mouth daily.     No current facility-administered medications for this visit.    REVIEW OF SYSTEMS:   10 Point review of Systems was done is negative except as noted above.   PHYSICAL EXAMINATION: ECOG PERFORMANCE STATUS: 1 - Symptomatic but completely ambulatory   NAD GENERAL:alert, in no acute distress and comfortable SKIN: no acute rashes, no significant lesions EYES: conjunctiva are pink and non-injected, sclera anicteric OROPHARYNX: MMM, no exudates, no oropharyngeal erythema or ulceration NECK: supple, no JVD LYMPH:  no palpable lymphadenopathy in the cervical, axillary or inguinal regions LUNGS: clear to auscultation b/l with normal respiratory effort HEART: regular rate & rhythm ABDOMEN:  normoactive bowel sounds , non tender,  not distended. Extremity: no pedal edema PSYCH: alert & oriented x 3 with fluent speech NEURO: no focal motor/sensory deficits   LABORATORY DATA:  I have reviewed the data as listed . CBC Latest Ref Rng & Units 06/18/2020 12/18/2019 06/20/2019  WBC 4.0 - 10.5 K/uL 6.8 6.6 6.6  Hemoglobin 13.0 - 17.0 g/dL 13.1 12.5(L) 13.1  Hematocrit 39.0 - 52.0 % 39.7 38.9(L) 39.6  Platelets 150 - 400 K/uL 225 218 216   HGB 13.6  CBC    Component Value Date/Time   WBC 6.8 06/18/2020 1310   RBC 4.61 06/18/2020 1310   HGB 13.1 06/18/2020 1310   HGB 13.5 12/15/2018 1019   HGB 13.0 05/20/2017 1012   HCT 39.7 06/18/2020 1310   HCT 39.4 05/20/2017 1012   PLT 225 06/18/2020 1310   PLT 213 12/15/2018 1019   PLT 207 05/20/2017 1012   MCV 86.1 06/18/2020 1310   MCV 86.4 05/20/2017 1012   MCH 28.4 06/18/2020 1310   MCHC 33.0 06/18/2020 1310   RDW 13.2 06/18/2020 1310   RDW 13.7 05/20/2017 1012   LYMPHSABS 1.6 06/18/2020 1310   LYMPHSABS 1.4 05/20/2017  1012   MONOABS 0.8 06/18/2020 1310   MONOABS 0.6 05/20/2017 1012   EOSABS 0.1 06/18/2020 1310   EOSABS 0.2 05/20/2017 1012   BASOSABS 0.1 06/18/2020 1310   BASOSABS 0.0 05/20/2017 1012    ANC 100  CMP Latest Ref Rng & Units 06/18/2020 12/18/2019 06/20/2019  Glucose 70 - 99 mg/dL 109(H) 122(H) 141(H)  BUN 8 - 23 mg/dL 12 17 10   Creatinine 0.61 - 1.24 mg/dL 0.82 0.84 0.80  Sodium 135 - 145 mmol/L 140 140 140  Potassium 3.5 - 5.1 mmol/L 4.6 4.3 4.4  Chloride 98 - 111 mmol/L 106 106 106  CO2 22 - 32 mmol/L 24 25 25   Calcium 8.9 - 10.3 mg/dL 9.4 9.6 8.7(L)  Total Protein 6.5 - 8.1 g/dL 7.2 7.0 6.6  Total Bilirubin 0.3 - 1.2 mg/dL 0.6 0.6 0.6  Alkaline Phos 38 - 126 U/L 86 91 90  AST 15 - 41 U/L 25 23 24   ALT 0 - 44 U/L 26 26 28   . Lab Results  Component Value Date   LDH 151 06/18/2020    RADIOGRAPHIC STUDIES: I have personally reviewed the radiological images as listed and agreed with the findings in the report.  .No results  found.  ASSESSMENT & PLAN:   70 y.o. 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  Patient was evaluated by radiation oncology and no ISRT was recommended. His LDH level appears to have been increased and he had a repeat PET/CT scan on 10/29/2015 that shows no overt evidence of lymphoma recurrence.  #2 s/p neutropenia post Rituxan delayed neutropenia- remains resolved.  #3 s/p  left sacral ala and S1 fracture. L5-S1 and S1-S2 left-sided nerve root compression -resolved  #4 history of peripheral arterial disease  #5 chemotherapy related neuropathy - nearly resolved.  #6 Hypertension, dyslipidemia, peripheral arterial disease -Continue management as per primary care physician.  #7 Ex-smoker with 80-pack-year history of smoking.  PLAN: -Discussed pt labwork today, 01/02/2021; discussed with patient - unremarkable. -The pt shows no lab or clinical evidence of Burkitts lymphoma recurrence/progression at this time.  -Pt completed chemotherapy in November 2017. Will move to annual visits five years post-treatment.  -maintain active lifestyle  FOLLOW UP: RTC with Dr Irene Limbo with labs in 12 months   The total time spent in the appointment was 20 minutes and more than 50% was on counseling and direct patient cares.  All of the patient's questions were answered with apparent satisfaction. The patient knows to call the clinic with any problems, questions or concerns.    Sullivan Lone MD [place] AAHIVMS [facility] Hematology/Oncology [facility]  (Office):       [phone] (Work cell):  [phone] (Fax):           [phone]  I, Reinaldo Raddle, am acting as scribe for Dr. Sullivan Lone, MD.    .I have reviewed the above documentation for accuracy and completeness, and I agree with the above. Brunetta Genera  MD

## 2021-01-02 ENCOUNTER — Inpatient Hospital Stay: Payer: PPO | Attending: Hematology

## 2021-01-02 ENCOUNTER — Other Ambulatory Visit: Payer: Self-pay

## 2021-01-02 ENCOUNTER — Inpatient Hospital Stay: Payer: PPO | Admitting: Hematology

## 2021-01-02 VITALS — BP 141/63 | HR 65 | Temp 97.8°F | Resp 18 | Wt 196.9 lb

## 2021-01-02 DIAGNOSIS — Z87891 Personal history of nicotine dependence: Secondary | ICD-10-CM | POA: Diagnosis not present

## 2021-01-02 DIAGNOSIS — Z79899 Other long term (current) drug therapy: Secondary | ICD-10-CM | POA: Insufficient documentation

## 2021-01-02 DIAGNOSIS — E785 Hyperlipidemia, unspecified: Secondary | ICD-10-CM | POA: Insufficient documentation

## 2021-01-02 DIAGNOSIS — C8378 Burkitt lymphoma, lymph nodes of multiple sites: Secondary | ICD-10-CM

## 2021-01-02 DIAGNOSIS — Z8572 Personal history of non-Hodgkin lymphomas: Secondary | ICD-10-CM | POA: Diagnosis not present

## 2021-01-02 DIAGNOSIS — I10 Essential (primary) hypertension: Secondary | ICD-10-CM | POA: Insufficient documentation

## 2021-01-02 DIAGNOSIS — Z9221 Personal history of antineoplastic chemotherapy: Secondary | ICD-10-CM | POA: Insufficient documentation

## 2021-01-02 DIAGNOSIS — I739 Peripheral vascular disease, unspecified: Secondary | ICD-10-CM | POA: Diagnosis not present

## 2021-01-02 LAB — CMP (CANCER CENTER ONLY)
ALT: 27 U/L (ref 0–44)
AST: 26 U/L (ref 15–41)
Albumin: 3.9 g/dL (ref 3.5–5.0)
Alkaline Phosphatase: 84 U/L (ref 38–126)
Anion gap: 8 (ref 5–15)
BUN: 12 mg/dL (ref 8–23)
CO2: 25 mmol/L (ref 22–32)
Calcium: 9 mg/dL (ref 8.9–10.3)
Chloride: 108 mmol/L (ref 98–111)
Creatinine: 0.85 mg/dL (ref 0.61–1.24)
GFR, Estimated: >60 mL/min (ref >60)
Glucose, Bld: 134 mg/dL — ABNORMAL HIGH (ref 70–99)
Potassium: 4.1 mmol/L (ref 3.5–5.1)
Sodium: 141 mmol/L (ref 135–145)
Total Bilirubin: 0.6 mg/dL (ref 0.3–1.2)
Total Protein: 6.4 g/dL — ABNORMAL LOW (ref 6.5–8.1)

## 2021-01-02 LAB — CBC WITH DIFFERENTIAL/PLATELET
Abs Immature Granulocytes: 0.01 10*3/uL (ref 0.00–0.07)
Basophils Absolute: 0 10*3/uL (ref 0.0–0.1)
Basophils Relative: 1 %
Eosinophils Absolute: 0.2 10*3/uL (ref 0.0–0.5)
Eosinophils Relative: 3 %
HCT: 37.6 % — ABNORMAL LOW (ref 39.0–52.0)
Hemoglobin: 12.8 g/dL — ABNORMAL LOW (ref 13.0–17.0)
Immature Granulocytes: 0 %
Lymphocytes Relative: 25 %
Lymphs Abs: 1.5 10*3/uL (ref 0.7–4.0)
MCH: 30.2 pg (ref 26.0–34.0)
MCHC: 34 g/dL (ref 30.0–36.0)
MCV: 88.7 fL (ref 80.0–100.0)
Monocytes Absolute: 0.7 10*3/uL (ref 0.1–1.0)
Monocytes Relative: 12 %
Neutro Abs: 3.7 10*3/uL (ref 1.7–7.7)
Neutrophils Relative %: 59 %
Platelets: 179 10*3/uL (ref 150–400)
RBC: 4.24 MIL/uL (ref 4.22–5.81)
RDW: 13.5 % (ref 11.5–15.5)
WBC: 6.1 10*3/uL (ref 4.0–10.5)
nRBC: 0 % (ref 0.0–0.2)

## 2021-01-02 LAB — LACTATE DEHYDROGENASE: LDH: 147 U/L (ref 98–192)

## 2021-01-05 ENCOUNTER — Telehealth: Payer: Self-pay | Admitting: Hematology

## 2021-01-05 NOTE — Telephone Encounter (Signed)
Scheduled appt per 8/5 los. Pt aware.

## 2021-01-15 DIAGNOSIS — Z1211 Encounter for screening for malignant neoplasm of colon: Secondary | ICD-10-CM | POA: Diagnosis not present

## 2021-01-15 DIAGNOSIS — I10 Essential (primary) hypertension: Secondary | ICD-10-CM | POA: Diagnosis not present

## 2021-01-15 DIAGNOSIS — G5792 Unspecified mononeuropathy of left lower limb: Secondary | ICD-10-CM | POA: Diagnosis not present

## 2021-01-15 DIAGNOSIS — I739 Peripheral vascular disease, unspecified: Secondary | ICD-10-CM | POA: Diagnosis not present

## 2021-01-15 DIAGNOSIS — Z1331 Encounter for screening for depression: Secondary | ICD-10-CM | POA: Diagnosis not present

## 2021-01-15 DIAGNOSIS — Z0000 Encounter for general adult medical examination without abnormal findings: Secondary | ICD-10-CM | POA: Diagnosis not present

## 2021-01-15 DIAGNOSIS — Z7189 Other specified counseling: Secondary | ICD-10-CM | POA: Diagnosis not present

## 2021-01-15 DIAGNOSIS — E785 Hyperlipidemia, unspecified: Secondary | ICD-10-CM | POA: Diagnosis not present

## 2021-02-18 ENCOUNTER — Ambulatory Visit: Payer: PPO | Admitting: Cardiology

## 2021-02-18 ENCOUNTER — Other Ambulatory Visit: Payer: Self-pay

## 2021-02-18 ENCOUNTER — Encounter: Payer: Self-pay | Admitting: Cardiology

## 2021-02-18 VITALS — BP 140/76 | HR 72 | Ht 73.0 in | Wt 197.0 lb

## 2021-02-18 DIAGNOSIS — I2510 Atherosclerotic heart disease of native coronary artery without angina pectoris: Secondary | ICD-10-CM

## 2021-02-18 DIAGNOSIS — C8378 Burkitt lymphoma, lymph nodes of multiple sites: Secondary | ICD-10-CM

## 2021-02-18 DIAGNOSIS — I739 Peripheral vascular disease, unspecified: Secondary | ICD-10-CM | POA: Diagnosis not present

## 2021-02-18 DIAGNOSIS — E785 Hyperlipidemia, unspecified: Secondary | ICD-10-CM | POA: Diagnosis not present

## 2021-02-18 DIAGNOSIS — I10 Essential (primary) hypertension: Secondary | ICD-10-CM | POA: Diagnosis not present

## 2021-02-18 DIAGNOSIS — R011 Cardiac murmur, unspecified: Secondary | ICD-10-CM

## 2021-02-18 DIAGNOSIS — R0602 Shortness of breath: Secondary | ICD-10-CM | POA: Diagnosis not present

## 2021-02-18 MED ORDER — CLOPIDOGREL BISULFATE 75 MG PO TABS: 75.0000 mg | ORAL_TABLET | Freq: Every evening | ORAL | 3 refills | Status: DC

## 2021-02-18 MED ORDER — ROSUVASTATIN CALCIUM 10 MG PO TABS: 10.0000 mg | ORAL_TABLET | Freq: Every day | ORAL | 3 refills | Status: DC

## 2021-02-18 MED ORDER — LISINOPRIL 20 MG PO TABS: 20.0000 mg | ORAL_TABLET | Freq: Every evening | ORAL | 3 refills | Status: DC

## 2021-02-18 MED ORDER — AMLODIPINE BESYLATE 10 MG PO TABS: 10.0000 mg | ORAL_TABLET | Freq: Every evening | ORAL | 3 refills | Status: DC

## 2021-02-18 NOTE — Progress Notes (Signed)
Cardiology Office Note:    Date:  02/18/2021   ID:  Erik Vaughn, DOB [birth year], MRN [id number]  PCP:  Ernestene Kiel, MD  Cardiologist:  Jenne Campus, MD    Referring MD: Ernestene Kiel, MD   Chief Complaint  Patient presents with   Follow-up  I am doing fine  History of Present Illness:    Erik Vaughn is a 70 y.o. male with past medical history significant for peripheral vascular disease.  He does have known critical carotic arterial stenosis as well as more significant stenosis of arteries and lower extremities.  He also Burkitt's lymphoma seems to be under control essential hypertension, dyslipidemia. He comes today to my office for follow-up overall he is doing very well he is very active.  He walks and work outside he likes to do work in the garden is building a deck and is very happy denies having any claudications.  There is no TIA CVA-like symptoms, there is no chest pain tightness squeezing pressure burning chest.  Past Medical History:  Diagnosis Date   Acute respiratory failure with hypoxia ([place]) 02/07/2016   Burkitt lymphoma of lymph nodes of multiple sites ([place]) 03/08/2016   Burkitt's lymphoma of lymph nodes of multiple regions ([place]) 12/25/2015   Cancer ([place])    Burkett Lymphoma   Claudication ([place]) 04/28/2015   Dyslipidemia 04/28/2015   Encounter for antineoplastic chemotherapy    Essential hypertension 04/28/2015   Hypertension    Murmur 02/07/2016   Neoplasm related pain    PAD (peripheral artery disease) ([place]) left leg    Past Surgical History:  Procedure Laterality Date   IR GENERIC HISTORICAL  12/31/2015   IR FLUORO GUIDE CV LINE RIGHT 12/31/2015 Arne Cleveland, MD [facility]INTER[facility] RAD   IR GENERIC HISTORICAL  12/31/2015   IR US GUIDE VASC ACCESS RIGHT 12/31/2015 Arne Cleveland, MD [facility]   IR GENERIC HISTORICAL  07/20/2016   IR REMOVAL TUN ACCESS W/ PORT W/O FL MOD SED 07/20/2016 Jacqulynn Cadet, MD [facility]INTER[facility]   left salivary Left    removed     Current Medications: Current Meds  Medication Sig   amLODipine (NORVASC) 10 MG tablet Take 1 tablet (10 mg total) by mouth every evening.   b complex vitamins tablet Take 1 tablet by mouth daily. Unknown strenght   clopidogrel (PLAVIX) 75 MG tablet Take 1 tablet (75 mg total) by mouth every evening.   lisinopril (ZESTRIL) 20 MG tablet Take 1 tablet (20 mg total) by mouth every evening.   rosuvastatin (CRESTOR) 10 MG tablet Take 1 tablet (10 mg total) by mouth daily.   tamsulosin (FLOMAX) 0.4 MG CAPS capsule Take 0.4 mg by mouth daily.     Allergies:   Patient has no known allergies.   Social History   Socioeconomic History   Marital status: Married    Spouse name: Not on file   Number of children: Not on file   Years of education: Not on file   Highest education level: Not on file  Occupational History   Occupation: meter specialist  Tobacco Use   Smoking status: Former    Packs/day: 2.00    Years: 16.00    Pack years: 32.00    Types: Cigarettes    Quit date: 12/22/2005    Years since quitting: 15.1   Smokeless tobacco: Current    Types: Chew  Vaping Use   Vaping Use: Never used  Substance and Sexual Activity   Alcohol use: No   Drug use:  No   Sexual activity: Never  Other Topics Concern   Not on file  Social History Narrative   Not on file   Social Determinants of Health   Financial Resource Strain: Not on file  Food Insecurity: Not on file  Transportation Needs: Not on file  Physical Activity: Not on file  Stress: Not on file  Social Connections: Not on file     Family History: The patient's family history includes Alzheimer's disease (age of onset: 96) in his father. ROS:   Please see the history of present illness.    All 14 point review of systems negative except as described per history of present illness  EKGs/Labs/Other Studies Reviewed:      Recent Labs: 01/02/2021: ALT 27; BUN 12; Creatinine 0.85; Hemoglobin 12.8; Platelets 179; Potassium  4.1; Sodium 141  Recent Lipid Panel    Component Value Date/Time   CHOL 86 (L) 08/18/2020 1139   TRIG 112 08/18/2020 1139   HDL 30 (L) 08/18/2020 1139   CHOLHDL 2.9 08/18/2020 1139   LDLCALC 35 08/18/2020 1139    Physical Exam:    VS:  BP 140/76 (BP Location: Left Arm, Patient Position: Sitting)   Pulse 72   Ht 6\' 1"  (1.854 m)   Wt 197 lb (89.4 kg)   SpO2 96%   BMI 25.99 kg/m     Wt Readings from Last 3 Encounters:  02/18/21 197 lb (89.4 kg)  01/02/21 196 lb 14.4 oz (89.3 kg)  08/18/20 204 lb (92.5 kg)     GEN:  Well nourished, well developed in no acute distress HEENT: Normal NECK: No JVD; No carotid bruits LYMPHATICS: No lymphadenopathy CARDIAC: RRR, systolic ejection murmur grade 1/6 best heard right upper portion of the sternum, no rubs, no gallops RESPIRATORY:  Clear to auscultation without rales, wheezing or rhonchi  ABDOMEN: Soft, non-tender, non-distended MUSCULOSKELETAL:  No edema; No deformity  SKIN: Warm and dry LOWER EXTREMITIES: no swelling NEUROLOGIC:  Alert and oriented x 3 PSYCHIATRIC:  Normal affect   ASSESSMENT:    1. Peripheral vascular disease ([place])   2. Essential hypertension   3. Burkitt's lymphoma of lymph nodes of multiple regions ([place])   4. Dyslipidemia    PLAN:    In order of problems listed above:  Peripheral vascular disease stable from that point review time to repeat his carotic ultrasounds which I will do. Essential hypertension blood pressure well controlled continue present medications. Dyslipidemia I did review his K PN which show me data from his primary care physician done in March with LDL of 35 HDL 30 we will continue present management which include Crestor 10. Burkitt's lymphoma has been followed by oncology stable. Heart murmur heard on the physical exam he be scheduled to have echocardiogram to assess the lesion.  I suspect his mild aortic stenosis   Medication Adjustments/Labs and Tests Ordered: Current medicines  are reviewed at length with the patient today.  Concerns regarding medicines are outlined above.  No orders of the defined types were placed in this encounter.  Medication changes: No orders of the defined types were placed in this encounter.   Signed, Park Liter, MD, [facility] 02/18/2021 10:43 AM    [place]

## 2021-02-18 NOTE — Patient Instructions (Signed)
Medication Instructions:  Your physician recommends that you continue on your current medications as directed. Please refer to the Current Medication list given to you today.  *If you need a refill on your cardiac medications before your next appointment, please call your pharmacy*   Lab Work: None If you have labs (blood work) drawn today and your tests are completely normal, you will receive your results only by: [place] (if you have MyChart) OR A paper copy in the mail If you have any lab test that is abnormal or we need to change your treatment, we will call you to review the results.   Testing/Procedures: Your physician has requested that you have an echocardiogram. Echocardiography is a painless test that uses sound waves to create images of your heart. It provides your doctor with information about the size and shape of your heart and how well your heart's chambers and valves are working. This procedure takes approximately one hour. There are no restrictions for this procedure.  Your physician has requested that you have a carotid duplex. This test is an ultrasound of the carotid arteries in your neck. It looks at blood flow through these arteries that supply the brain with blood. Allow one hour for this exam. There are no restrictions or special instructions.    Follow-Up: At [facility], you and your health needs are our priority.  As part of our continuing mission to provide you with exceptional heart care, we have created designated Provider Care Teams.  These Care Teams include your primary Cardiologist (physician) and Advanced Practice Providers (APPs -  Physician Assistants and Nurse Practitioners) who all work together to provide you with the care you need, when you need it.  We recommend signing up for the patient portal called "MyChart".  Sign up information is provided on this After Visit Summary.  MyChart is used to connect with patients for Virtual Visits  (Telemedicine).  Patients are able to view lab/test results, encounter notes, upcoming appointments, etc.  Non-urgent messages can be sent to your provider as well.   To learn more about what you can do with MyChart, go to [email or web address].    Your next appointment:   6 month(s)  The format for your next appointment:   In Person  Provider:   Jenne Campus, MD   Other Instructions Echocardiogram An echocardiogram is a test that uses sound waves (ultrasound) to produce images of the heart. Images from an echocardiogram can provide important information about: Heart size and shape. The size and thickness and movement of your heart's walls. Heart muscle function and strength. Heart valve function or if you have stenosis. Stenosis is when the heart valves are too narrow. If blood is flowing backward through the heart valves (regurgitation). A tumor or infectious growth around the heart valves. Areas of heart muscle that are not working well because of poor blood flow or injury from a heart attack. Aneurysm detection. An aneurysm is a weak or damaged part of an artery wall. The wall bulges out from the normal force of blood pumping through the body. Tell a health care provider about: Any allergies you have. All medicines you are taking, including vitamins, herbs, eye drops, creams, and over-the-counter medicines. Any blood disorders you have. Any surgeries you have had. Any medical conditions you have. Whether you are pregnant or may be pregnant. What are the risks? Generally, this is a safe test. However, problems may occur, including an allergic reaction to dye (contrast) that may be  used during the test. What happens before the test? No specific preparation is needed. You may eat and drink normally. What happens during the test?  You will take off your clothes from the waist up and put on a hospital gown. Electrodes or electrocardiogram (ECG)patches may be placed on your  chest. The electrodes or patches are then connected to a device that monitors your heart rate and rhythm. You will lie down on a table for an ultrasound exam. A gel will be applied to your chest to help sound waves pass through your skin. A handheld device, called a transducer, will be pressed against your chest and moved over your heart. The transducer produces sound waves that travel to your heart and bounce back (or "echo" back) to the transducer. These sound waves will be captured in real-time and changed into images of your heart that can be viewed on a video monitor. The images will be recorded on a computer and reviewed by your health care provider. You may be asked to change positions or hold your breath for a short time. This makes it easier to get different views or better views of your heart. In some cases, you may receive contrast through an IV in one of your veins. This can improve the quality of the pictures from your heart. The procedure may vary among health care providers and hospitals. What can I expect after the test? You may return to your normal, everyday life, including diet, activities, and medicines, unless your health care provider tells you not to do that. Follow these instructions at home: It is up to you to get the results of your test. Ask your health care provider, or the department that is doing the test, when your results will be ready. Keep all follow-up visits. This is important. Summary An echocardiogram is a test that uses sound waves (ultrasound) to produce images of the heart. Images from an echocardiogram can provide important information about the size and shape of your heart, heart muscle function, heart valve function, and other possible heart problems. You do not need to do anything to prepare before this test. You may eat and drink normally. After the echocardiogram is completed, you may return to your normal, everyday life, unless your health care provider  tells you not to do that. This information is not intended to replace advice given to you by your health care provider. Make sure you discuss any questions you have with your health care provider. Document Revised: 01/08/2020 Document Reviewed: 01/08/2020 Elsevier Patient Education  2022 [employer].

## 2021-02-18 NOTE — Addendum Note (Signed)
Addended by: Orvan July on: 02/18/2021 10:59 AM   Modules accepted: Orders

## 2021-03-03 ENCOUNTER — Other Ambulatory Visit: Payer: PPO

## 2021-03-03 DIAGNOSIS — L03119 Cellulitis of unspecified part of limb: Secondary | ICD-10-CM | POA: Diagnosis not present

## 2021-03-03 DIAGNOSIS — Z23 Encounter for immunization: Secondary | ICD-10-CM | POA: Diagnosis not present

## 2021-03-03 DIAGNOSIS — S91332A Puncture wound without foreign body, left foot, initial encounter: Secondary | ICD-10-CM | POA: Diagnosis not present

## 2021-03-10 ENCOUNTER — Other Ambulatory Visit: Payer: PPO

## 2021-03-20 ENCOUNTER — Other Ambulatory Visit: Payer: Self-pay

## 2021-03-20 ENCOUNTER — Ambulatory Visit (INDEPENDENT_AMBULATORY_CARE_PROVIDER_SITE_OTHER): Payer: PPO

## 2021-03-20 DIAGNOSIS — I6522 Occlusion and stenosis of left carotid artery: Secondary | ICD-10-CM

## 2021-03-20 DIAGNOSIS — R0602 Shortness of breath: Secondary | ICD-10-CM

## 2021-03-20 DIAGNOSIS — I2510 Atherosclerotic heart disease of native coronary artery without angina pectoris: Secondary | ICD-10-CM

## 2021-03-20 DIAGNOSIS — R011 Cardiac murmur, unspecified: Secondary | ICD-10-CM

## 2021-03-20 LAB — ECHOCARDIOGRAM COMPLETE
AR max vel: 1.54 cm2
AV Area VTI: 1.58 cm2
AV Area mean vel: 1.59 cm2
AV Mean grad: 14.8 mmHg
AV Peak grad: 28.6 mmHg
Ao pk vel: 2.68 m/s
Area-P 1/2: 2.75 cm2
S' Lateral: 2.3 cm

## 2021-03-26 DIAGNOSIS — Z1211 Encounter for screening for malignant neoplasm of colon: Secondary | ICD-10-CM | POA: Diagnosis not present

## 2021-03-27 DIAGNOSIS — N401 Enlarged prostate with lower urinary tract symptoms: Secondary | ICD-10-CM | POA: Diagnosis not present

## 2021-03-27 DIAGNOSIS — R972 Elevated prostate specific antigen [PSA]: Secondary | ICD-10-CM | POA: Diagnosis not present

## 2021-08-19 ENCOUNTER — Ambulatory Visit: Payer: PPO | Admitting: Cardiology

## 2021-08-19 ENCOUNTER — Encounter: Payer: Self-pay | Admitting: Cardiology

## 2021-08-19 VITALS — BP 116/66 | HR 73 | Ht 72.0 in | Wt 198.0 lb

## 2021-08-19 DIAGNOSIS — C8378 Burkitt lymphoma, lymph nodes of multiple sites: Secondary | ICD-10-CM | POA: Diagnosis not present

## 2021-08-19 DIAGNOSIS — I739 Peripheral vascular disease, unspecified: Secondary | ICD-10-CM

## 2021-08-19 DIAGNOSIS — I10 Essential (primary) hypertension: Secondary | ICD-10-CM | POA: Diagnosis not present

## 2021-08-19 DIAGNOSIS — E785 Hyperlipidemia, unspecified: Secondary | ICD-10-CM | POA: Diagnosis not present

## 2021-08-19 NOTE — Patient Instructions (Signed)

## 2021-08-19 NOTE — Progress Notes (Signed)
?Cardiology Office Note:   ? ?Date:  08/19/2021  ? ?ID:  Erik Vaughn, DOB Nov 03, 1950, MRN [id number] ? ?PCP:  Erik Kiel, MD  ?Cardiologist:  Erik Campus, MD   ? ?Referring MD: Erik Kiel, MD  ? ?Chief Complaint  ?Patient presents with  ? Follow-up  ?I am doing very well ? ?History of Present Illness:   ? ?Erik Vaughn is a 71 y.o. male with past medical history significant for peripheral vascular disease, he does have carotic arterial disease last time checked in fall of last year showing up to 69% stenosis, also stenosis of lower extremities he does have history of Burkitt's lymphoma essential hypertension dyslipidemia.  Comes today 2 months for follow-up overall he is doing very well.  Still very active walks around have no difficulty doing this.  Denies have any chest pain tightness squeezing pressure burning chest.  No palpitations no dizziness. ? ?Past Medical History:  ?Diagnosis Date  ? Acute respiratory failure with hypoxia ([place]) 02/07/2016  ? Burkitt lymphoma of lymph nodes of multiple sites ([place]) 03/08/2016  ? Burkitt's lymphoma of lymph nodes of multiple regions ([place]) 12/25/2015  ? Cancer [facility])   ? Burkett Lymphoma  ? Claudication ([place]) 04/28/2015  ? Dyslipidemia 04/28/2015  ? Encounter for antineoplastic chemotherapy   ? Essential hypertension 04/28/2015  ? Hypertension   ? Murmur 02/07/2016  ? Neoplasm related pain   ? PAD (peripheral artery disease) (HCC) left leg  ? ? ?Past Surgical History:  ?Procedure Laterality Date  ? IR GENERIC HISTORICAL  12/31/2015  ? IR FLUORO GUIDE CV LINE RIGHT 12/31/2015 Erik Cleveland, MD [facility]  ? IR GENERIC HISTORICAL  12/31/2015  ? IR US GUIDE VASC ACCESS RIGHT 12/31/2015 Erik Cleveland, MD [facility]  ? IR GENERIC HISTORICAL  07/20/2016  ? IR REMOVAL TUN ACCESS W/ PORT W/O FL MOD SED 07/20/2016 Erik Cadet, MD [facility]  ? left salivary Left   ? removed  ? ? ?Current Medications: ?Current Meds  ?Medication Sig  ? amLODipine (NORVASC) 10 MG  tablet Take 1 tablet (10 mg total) by mouth every evening.  ? b complex vitamins tablet Take 1 tablet by mouth daily. Unknown strenght  ? clopidogrel (PLAVIX) 75 MG tablet Take 1 tablet (75 mg total) by mouth every evening.  ? lisinopril (ZESTRIL) 20 MG tablet Take 1 tablet (20 mg total) by mouth every evening.  ? rosuvastatin (CRESTOR) 10 MG tablet Take 1 tablet (10 mg total) by mouth daily.  ? tamsulosin (FLOMAX) 0.4 MG CAPS capsule Take 0.4 mg by mouth daily.  ?  ? ?Allergies:   Patient has no known allergies.  ? ?Social History  ? ?Socioeconomic History  ? Marital status: Married  ?  Spouse name: Not on file  ? Number of children: Not on file  ? Years of education: Not on file  ? Highest education level: Not on file  ?Occupational History  ? Occupation: meter specialist  ?Tobacco Use  ? Smoking status: Former  ?  Packs/day: 2.00  ?  Years: 16.00  ?  Pack years: 32.00  ?  Types: Cigarettes  ?  Quit date: 12/22/2005  ?  Years since quitting: 15.6  ? Smokeless tobacco: Current  ?  Types: Chew  ?Vaping Use  ? Vaping Use: Never used  ?Substance and Sexual Activity  ? Alcohol use: No  ? Drug use: No  ? Sexual activity: Never  ?Other Topics Concern  ? Not on file  ?Social History Narrative  ?  Not on file  ? ?Social Determinants of Health  ? ?Financial Resource Strain: Not on file  ?Food Insecurity: Not on file  ?Transportation Needs: Not on file  ?Physical Activity: Not on file  ?Stress: Not on file  ?Social Connections: Not on file  ?  ? ?Family History: ?The patient's family history includes Alzheimer's disease (age of onset: 58) in his father. ?ROS:   ?Please see the history of present illness.    ?All 14 point review of systems negative except as described per history of present illness ? ?EKGs/Labs/Other Studies Reviewed:   ? ? ? ?Recent Labs: ?01/02/2021: ALT 27; BUN 12; Creatinine 0.85; Hemoglobin 12.8; Platelets 179; Potassium 4.1; Sodium 141  ?Recent Lipid Panel ?   ?Component Value Date/Time  ? CHOL 86 (L)  08/18/2020 1139  ? TRIG 112 08/18/2020 1139  ? HDL 30 (L) 08/18/2020 1139  ? CHOLHDL 2.9 08/18/2020 1139  ? LDLCALC 35 08/18/2020 1139  ? ? ?Physical Exam:   ? ?VS:  BP 116/66 (BP Location: Right Arm, Patient Position: Sitting)   Pulse 73   Ht 6' (1.829 m)   Wt 198 lb (89.8 kg)   SpO2 94%   BMI 26.85 kg/m?    ? ?Wt Readings from Last 3 Encounters:  ?08/19/21 198 lb (89.8 kg)  ?02/18/21 197 lb (89.4 kg)  ?01/02/21 196 lb 14.4 oz (89.3 kg)  ?  ? ?GEN:  Well nourished, well developed in no acute distress ?HEENT: Normal ?NECK: No JVD; No carotid bruits ?LYMPHATICS: No lymphadenopathy ?CARDIAC: RRR, no murmurs, no rubs, no gallops ?RESPIRATORY:  Clear to auscultation without rales, wheezing or rhonchi  ?ABDOMEN: Soft, non-tender, non-distended ?MUSCULOSKELETAL:  No edema; No deformity  ?SKIN: Warm and dry ?LOWER EXTREMITIES: no swelling ?NEUROLOGIC:  Alert and oriented x 3 ?PSYCHIATRIC:  Normal affect  ? ?ASSESSMENT:   ? ?1. Essential hypertension   ?2. Burkitt's lymphoma of lymph nodes of multiple regions Erik Vaughn)   ?3. Claudication (Erik Vaughn)   ?4. Dyslipidemia   ? ?PLAN:   ? ?In order of problems listed above: ? ?Essential hypertension blood pressure is well controlled continue present management. ?History of Burkitt lymphoma doing well from that point review.  Continue present management. ?Leg pain probably related to chemotherapy.  Still walking a lot have no difficulty doing it.  He discovered that cramps can be helped by stretching his legs and he does not have the time and he has no cramps anymore.  Dyslipidemia I did review his K PN which show me his LDL of 35 HDL 30.  We will continue present management.  That include Crestor 10 which I will continue ? ? ?Medication Adjustments/Labs and Tests Ordered: ?Current medicines are reviewed at length with the patient today.  Concerns regarding medicines are outlined above.  ?No orders of the defined types were placed in this encounter. ? ?Medication changes: No orders of  the defined types were placed in this encounter. ? ? ?Signed, ?Park Liter, MD, [facility] ?08/19/2021 11:34 AM    ?[place] ?

## 2021-09-23 DIAGNOSIS — N401 Enlarged prostate with lower urinary tract symptoms: Secondary | ICD-10-CM | POA: Diagnosis not present

## 2021-09-23 DIAGNOSIS — R972 Elevated prostate specific antigen [PSA]: Secondary | ICD-10-CM | POA: Diagnosis not present

## 2022-01-04 ENCOUNTER — Other Ambulatory Visit: Payer: Self-pay

## 2022-01-04 DIAGNOSIS — C8378 Burkitt lymphoma, lymph nodes of multiple sites: Secondary | ICD-10-CM

## 2022-01-05 ENCOUNTER — Inpatient Hospital Stay: Payer: PPO | Attending: Hematology

## 2022-01-05 ENCOUNTER — Other Ambulatory Visit: Payer: Self-pay

## 2022-01-05 ENCOUNTER — Inpatient Hospital Stay: Payer: PPO | Admitting: Hematology

## 2022-01-05 VITALS — BP 144/62 | HR 70 | Temp 97.9°F | Resp 20 | Wt 200.9 lb

## 2022-01-05 DIAGNOSIS — Z9221 Personal history of antineoplastic chemotherapy: Secondary | ICD-10-CM | POA: Insufficient documentation

## 2022-01-05 DIAGNOSIS — C8378 Burkitt lymphoma, lymph nodes of multiple sites: Secondary | ICD-10-CM

## 2022-01-05 DIAGNOSIS — Z8572 Personal history of non-Hodgkin lymphomas: Secondary | ICD-10-CM | POA: Diagnosis not present

## 2022-01-05 LAB — CBC WITH DIFFERENTIAL (CANCER CENTER ONLY)
Abs Immature Granulocytes: 0.02 10*3/uL (ref 0.00–0.07)
Basophils Absolute: 0.1 10*3/uL (ref 0.0–0.1)
Basophils Relative: 1 %
Eosinophils Absolute: 0.2 10*3/uL (ref 0.0–0.5)
Eosinophils Relative: 3 %
HCT: 40.1 % (ref 39.0–52.0)
Hemoglobin: 14 g/dL (ref 13.0–17.0)
Immature Granulocytes: 0 %
Lymphocytes Relative: 24 %
Lymphs Abs: 1.7 10*3/uL (ref 0.7–4.0)
MCH: 30.2 pg (ref 26.0–34.0)
MCHC: 34.9 g/dL (ref 30.0–36.0)
MCV: 86.6 fL (ref 80.0–100.0)
Monocytes Absolute: 0.7 10*3/uL (ref 0.1–1.0)
Monocytes Relative: 11 %
Neutro Abs: 4.3 10*3/uL (ref 1.7–7.7)
Neutrophils Relative %: 61 %
Platelet Count: 194 10*3/uL (ref 150–400)
RBC: 4.63 MIL/uL (ref 4.22–5.81)
RDW: 13.1 % (ref 11.5–15.5)
WBC Count: 7 10*3/uL (ref 4.0–10.5)
nRBC: 0 % (ref 0.0–0.2)

## 2022-01-05 LAB — CMP (CANCER CENTER ONLY)
ALT: 27 U/L (ref 0–44)
AST: 25 U/L (ref 15–41)
Albumin: 4.5 g/dL (ref 3.5–5.0)
Alkaline Phosphatase: 81 U/L (ref 38–126)
Anion gap: 6 (ref 5–15)
BUN: 14 mg/dL (ref 8–23)
CO2: 27 mmol/L (ref 22–32)
Calcium: 9.6 mg/dL (ref 8.9–10.3)
Chloride: 104 mmol/L (ref 98–111)
Creatinine: 0.73 mg/dL (ref 0.61–1.24)
GFR, Estimated: >60 mL/min (ref >60)
Glucose, Bld: 99 mg/dL (ref 70–99)
Potassium: 4.1 mmol/L (ref 3.5–5.1)
Sodium: 137 mmol/L (ref 135–145)
Total Bilirubin: 0.7 mg/dL (ref 0.3–1.2)
Total Protein: 7.3 g/dL (ref 6.5–8.1)

## 2022-01-05 LAB — LACTATE DEHYDROGENASE: LDH: 138 U/L (ref 98–192)

## 2022-01-06 ENCOUNTER — Telehealth: Payer: Self-pay | Admitting: Hematology

## 2022-01-06 NOTE — Telephone Encounter (Signed)
Scheduled follow-up appointment per 8/8 los. Patient is aware. 

## 2022-01-06 NOTE — Progress Notes (Signed)
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 This appears to be involving the sacrum causing sacral and root involvement with possible neurogenic bladder. PET CT scan as noted above shows extensive involvement with lymphoma. CSF negative for involvement. Bone marrow biopsy negative for involvement by lymphoma.  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 PET CT scan as noted above shows extensive involvement with lymphoma. CSF negative for involvement. Bone marrow biopsy negative for involvement by lymphoma. PET/CT scan after 3 cycles of EPOCH-R  on 03/02/2016  showed significant interval metabolic response.  Patient has completed cycle 6 of EPOCH-R and tolerated it well with no prohibitive toxicities. Grade 1 fatigue.  He has had some episodes of fevers including a periodontal abscess,  non-neutropenic fevers - unclear etiology, recent Escherichia coli urinary tract infection x 2 and Clostridium difficile diarrhea.  PET CT scan done after completion of this planned chemotherapy shows no clear evidence of residual lymphoma . Some borderline FDG avidity in the left sacral ala ?pathologic fracture vs residual lymphoma  Patient was evaluated by radiation oncology - no RT recommended to sacral lesion.  CURRENT TREATMENT -Active surveillance  Previous treatment The patient has completed 6 cycles of EPOCH -R and IT methotrexate x 4 doses starting with cycle 3 for CNS prophylaxis   HISTORY OF  PRESENTING ILLNESS:  Please see my previous note for details  INTERVAL HISTORY:  Erik Vaughn is a 71 y.o. male here for a follow-up of his Burkitts lymphoma. The patient's last visit with [place] was on 01/02/2021.  Patient reports that he has been doing well and has no acute new focal symptoms or other concerning new symptoms. He reports concern about his wife's overall health and that he has been doing well. He notes no fevers no chills no night sweats. No new lumps or bumps. No unexpected significant weight loss or new fatigue.  Labs done today were reviewed in detail.  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   He smoked about 2 packs a day for 40 years.  SURGICAL HISTORY:  #1 left submandibular salivary gland benign tumor excision #2 port placement - 12/31/2015  SOCIAL HISTORY: Social History   Socioeconomic History   Marital status: Married    Spouse name: Not on file   Number of children: Not on file   Years of education: Not on file   Highest education level: Not on file  Occupational History   Occupation: meter specialist  Tobacco Use   Smoking status: Former    Packs/day: 2.00    Years: 16.00    Total pack years: 32.00    Types: Cigarettes    Quit date: 12/22/2005    Years since quitting: 16.0   Smokeless tobacco: Current    Types: Chew  Vaping Use   Vaping Use: Never used  Substance and Sexual Activity   Alcohol use: No   Drug use: No   Sexual activity: Never  Other  Topics Concern   Not on file  Social History Narrative   Not on file   Social Determinants of Health   Financial Resource Strain: Not on file  Food Insecurity: Not on file  Transportation Needs: Not on file  Physical Activity: Not on file  Stress: Not on file  Social Connections: Not on file  Intimate  Partner Violence: Not on file   smoked 2 packs of cigarettes per day for 40 years quit about 10 years ago. Denies significant alcohol use. 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  FAMILY HISTORY:  Sister had breast cancer at age 74 years. Unknown if she had genetic testing One maternal uncle had melanoma Second maternal uncle had pancreatic cancer under the age of 36yrPaternal uncle with prostate cancer Dad had prostate cancer at age 3871years   ALLERGIES:  has No Known Allergies.  MEDICATIONS:  Current Outpatient Medications  Medication Sig Dispense Refill   amLODipine (NORVASC) 10 MG tablet Take 1 tablet (10 mg total) by mouth every evening. 90 tablet 3   b complex vitamins tablet Take 1 tablet by mouth daily. Unknown strenght     clopidogrel (PLAVIX) 75 MG tablet Take 1 tablet (75 mg total) by mouth every evening. 90 tablet 3   lisinopril (ZESTRIL) 20 MG tablet Take 1 tablet (20 mg total) by mouth every evening. 90 tablet 3   rosuvastatin (CRESTOR) 10 MG tablet Take 1 tablet (10 mg total) by mouth daily. 90 tablet 3   tamsulosin (FLOMAX) 0.4 MG CAPS capsule Take 0.4 mg by mouth daily.     No current facility-administered medications for this visit.    REVIEW OF SYSTEMS:   10 Point review of Systems was done is negative except as noted above.  PHYSICAL EXAMINATION: ECOG PERFORMANCE STATUS: 1 - Symptomatic but completely ambulatory   Today's Vitals   01/05/22 1338  BP: (!) 144/62  Pulse: 70  Resp: 20  Temp: 97.9 F (36.6 C)  Weight: 200 lb 14.4 oz (91.1 kg)   Body mass index is 27.25 kg/m. .Erik KitchenGENERAL:alert, in no acute distress and comfortable SKIN: no acute rashes, no significant lesions EYES: conjunctiva are pink and non-injected, sclera anicteric OROPHARYNX: MMM, no exudates, no oropharyngeal  erythema or ulceration NECK: supple, no JVD LYMPH:  no palpable lymphadenopathy in the cervical, axillary or inguinal regions LUNGS: clear to auscultation b/l with normal respiratory effort HEART: regular rate & rhythm ABDOMEN:  normoactive bowel sounds , non tender, not distended. Extremity: no pedal edema PSYCH: alert & oriented x 3 with fluent speech NEURO: no focal motor/sensory deficits   LABORATORY DATA:  I have reviewed the data as listed .    Latest Ref Rng & Units 01/05/2022    1:31 PM 01/02/2021   10:34 AM 06/18/2020    1:10 PM  CBC  WBC 4.0 - 10.5 K/uL 7.0  6.1  6.8   Hemoglobin 13.0 - 17.0 g/dL 14.0  12.8  13.1   Hematocrit 39.0 - 52.0 % 40.1  37.6  39.7   Platelets 150 - 400 K/uL 194  179  225    HGB 13.6  CBC    Component Value Date/Time   WBC 7.0 01/05/2022 1331   WBC 6.1 01/02/2021 1034   RBC 4.63 01/05/2022 1331   HGB 14.0 01/05/2022 1331  HGB 13.0 05/20/2017 1012   HCT 40.1 01/05/2022 1331   HCT 39.4 05/20/2017 1012   PLT 194 01/05/2022 1331   PLT 207 05/20/2017 1012   MCV 86.6 01/05/2022 1331   MCV 86.4 05/20/2017 1012   MCH 30.2 01/05/2022 1331   MCHC 34.9 01/05/2022 1331   RDW 13.1 01/05/2022 1331   RDW 13.7 05/20/2017 1012   LYMPHSABS 1.7 01/05/2022 1331   LYMPHSABS 1.4 05/20/2017 1012   MONOABS 0.7 01/05/2022 1331   MONOABS 0.6 05/20/2017 1012   EOSABS 0.2 01/05/2022 1331   EOSABS 0.2 05/20/2017 1012   BASOSABS 0.1 01/05/2022 1331   BASOSABS 0.0 05/20/2017 1012    ANC 100     Latest Ref Rng & Units 01/05/2022    1:31 PM 01/02/2021   10:34 AM 06/18/2020    1:10 PM  CMP  Glucose 70 - 99 mg/dL 99  134  109   BUN 8 - 23 mg/dL 14  12  12    Creatinine 0.61 - 1.24 mg/dL 0.73  0.85  0.82   Sodium 135 - 145 mmol/L 137  141  140   Potassium 3.5 - 5.1 mmol/L 4.1  4.1  4.6   Chloride 98 - 111 mmol/L 104  108  106   CO2 22 - 32 mmol/L 27  25  24    Calcium 8.9 - 10.3 mg/dL 9.6  9.0  9.4   Total Protein 6.5 - 8.1 g/dL 7.3  6.4  7.2   Total  Bilirubin 0.3 - 1.2 mg/dL 0.7  0.6  0.6   Alkaline Phos 38 - 126 U/L 81  84  86   AST 15 - 41 U/L 25  26  25    ALT 0 - 44 U/L 27  27  26    . Lab Results  Component Value Date   LDH 138 01/05/2022    RADIOGRAPHIC STUDIES: I have personally reviewed the radiological images as listed and agreed with the findings in the report.  .No results found.  ASSESSMENT & PLAN:   71 y.o. 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  Patient was evaluated by radiation oncology and no ISRT was recommended. His LDH level appears to have been increased and he had a repeat PET/CT scan on 10/29/2015 that shows no overt evidence of lymphoma recurrence.  #2 s/p neutropenia post Rituxan delayed neutropenia- remains resolved.  #3 s/p  left sacral ala and S1 fracture. L5-S1 and S1-S2 left-sided nerve root compression -resolved  #4 history of peripheral arterial disease  #5 chemotherapy related neuropathy - nearly resolved.  #6 Hypertension, dyslipidemia, peripheral arterial disease -Continue management as per primary care physician.  #7 Ex-smoker with 80-pack-year history of smoking.  PLAN: -Discussed pt labwork today, 01/05/2022; discussed with patient - unremarkable. -CBC CMP and LDH within normal limits -The pt shows no lab or clinical evidence of Burkitts lymphoma recurrence/progression at this time.  -maintain active lifestyle -Was counseled on staying up to date with vaccinations with his primary care physician including his COVID booster yearly flu shot updated pneumonia vaccines etc. -He expresses significant gratitude for all his treatment and neurology cares thus far.  FOLLOW UP: RTC with Dr Irene Limbo with labs in 12 months   The total time spent in the appointment was 22 minutes*.  All of the patient's questions were answered  with apparent satisfaction. The  patient knows to call the clinic with any problems, questions or concerns.   Erik Lone MD MS AAHIVMS [facility] Hematology/Oncology Physician [facility]  .*Total Encounter Time as defined by the Centers for Medicare and Medicaid Services includes, in addition to the face-to-face time of a patient visit (documented in the note above) non-face-to-face time: obtaining and reviewing outside history, ordering and reviewing medications, tests or procedures, care coordination (communications with other health care professionals or caregivers) and documentation in the medical record.   I, Erik Vaughn, am acting as scribe for Dr. Sullivan Lone, MD.  .I have reviewed the above documentation for accuracy and completeness, and I agree with the above. Erik Genera MD

## 2022-01-13 DIAGNOSIS — H04123 Dry eye syndrome of bilateral lacrimal glands: Secondary | ICD-10-CM | POA: Diagnosis not present

## 2022-01-13 DIAGNOSIS — H524 Presbyopia: Secondary | ICD-10-CM | POA: Diagnosis not present

## 2022-01-23 DIAGNOSIS — R6884 Jaw pain: Secondary | ICD-10-CM | POA: Diagnosis not present

## 2022-01-23 DIAGNOSIS — K029 Dental caries, unspecified: Secondary | ICD-10-CM | POA: Diagnosis not present

## 2022-02-05 DIAGNOSIS — G4700 Insomnia, unspecified: Secondary | ICD-10-CM | POA: Diagnosis not present

## 2022-02-05 DIAGNOSIS — F411 Generalized anxiety disorder: Secondary | ICD-10-CM | POA: Diagnosis not present

## 2022-02-17 DIAGNOSIS — G4700 Insomnia, unspecified: Secondary | ICD-10-CM | POA: Diagnosis not present

## 2022-02-19 ENCOUNTER — Encounter: Payer: Self-pay | Admitting: Cardiology

## 2022-02-19 ENCOUNTER — Ambulatory Visit: Payer: PPO | Attending: Cardiology | Admitting: Cardiology

## 2022-02-19 VITALS — BP 134/76 | HR 65 | Ht 72.0 in | Wt 201.8 lb

## 2022-02-19 DIAGNOSIS — C8590 Non-Hodgkin lymphoma, unspecified, unspecified site: Secondary | ICD-10-CM | POA: Diagnosis not present

## 2022-02-19 DIAGNOSIS — E785 Hyperlipidemia, unspecified: Secondary | ICD-10-CM

## 2022-02-19 DIAGNOSIS — I739 Peripheral vascular disease, unspecified: Secondary | ICD-10-CM | POA: Diagnosis not present

## 2022-02-19 DIAGNOSIS — I10 Essential (primary) hypertension: Secondary | ICD-10-CM | POA: Diagnosis not present

## 2022-02-19 DIAGNOSIS — C859A Non-Hodgkin lymphoma, unspecified, in remission: Secondary | ICD-10-CM

## 2022-02-19 NOTE — Progress Notes (Signed)
Cardiology Office Note:    Date:  02/19/2022   ID:  Erik Vaughn, DOB [birth year], MRN [id number]  PCP:  Townsend Roger, MD  Cardiologist:  Jenne Campus, MD    Referring MD: Ernestene Kiel, MD   Chief Complaint  Patient presents with   Follow-up  Doing well  History of Present Illness:    Erik Vaughn is a 71 y.o. male with past medical history significant for peripheral vascular disease, he does have carotic arterial disease with up to 69% stenosis on the left side.  He comes to my office today for follow-up overall he is doing very well.  He denies of any chest pain tightness squeezing pressure burning chest.  Able to do activities of daily living with no difficulties.  He is also telling me that he is cutting grass on the regular basis with a push mower and have no problem.  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 ([place]) 03/08/2016   Burkitt's lymphoma of lymph nodes of multiple regions ([place]) 12/25/2015   Cancer ([place])    Burkett Lymphoma   Claudication ([place]) 04/28/2015   Dyslipidemia 04/28/2015   Encounter for antineoplastic chemotherapy    Essential hypertension 04/28/2015   Hypertension    Murmur 02/07/2016   Neoplasm related pain    PAD (peripheral artery disease) ([place]) left leg    Past Surgical History:  Procedure Laterality Date   IR GENERIC HISTORICAL  12/31/2015   IR FLUORO GUIDE CV LINE RIGHT 12/31/2015 Arne Cleveland, MD [facility]   IR GENERIC HISTORICAL  12/31/2015   IR US GUIDE VASC ACCESS RIGHT 12/31/2015 Arne Cleveland, MD [facility]   IR GENERIC HISTORICAL  07/20/2016   IR REMOVAL TUN ACCESS W/ PORT W/O FL MOD SED 07/20/2016 Jacqulynn Cadet, MD [facility]   left salivary Left    removed    Current Medications: Current Meds  Medication Sig   amLODipine (NORVASC) 10 MG tablet Take 1 tablet (10 mg total) by mouth every evening.   b complex  vitamins tablet Take 1 tablet by mouth daily. Unknown strenght   clopidogrel (PLAVIX) 75 MG tablet Take 1 tablet (75 mg total) by mouth every evening.   lisinopril (ZESTRIL) 20 MG tablet Take 1 tablet (20 mg total) by mouth every evening.   rosuvastatin (CRESTOR) 10 MG tablet Take 1 tablet (10 mg total) by mouth daily.   tamsulosin (FLOMAX) 0.4 MG CAPS capsule Take 0.4 mg by mouth daily.   traZODone (DESYREL) 50 MG tablet Take 50 mg by mouth at bedtime.     Allergies:   Patient has no known allergies.   Social History   Socioeconomic History   Marital status: Married    Spouse name: Not on file   Number of children: Not on file   Years of education: Not on file   Highest education level: Not on file  Occupational History   Occupation: meter specialist  Tobacco Use   Smoking status: Former    Packs/day: 2.00    Years: 16.00    Total pack years: 32.00    Types: Cigarettes    Quit date: 12/22/2005    Years since quitting: 16.1   Smokeless tobacco: Current    Types: Chew  Vaping Use   Vaping Use: Never used  Substance and Sexual Activity   Alcohol use: No   Drug use: No   Sexual  activity: Never  Other Topics Concern   Not on file  Social History Narrative   Not on file   Social Determinants of Health   Financial Resource Strain: Not on file  Food Insecurity: Not on file  Transportation Needs: Not on file  Physical Activity: Not on file  Stress: Not on file  Social Connections: Not on file     Family History: The patient's family history includes Alzheimer's disease (age of onset: 35) in his father. ROS:   Please see the history of present illness.    All 14 point review of systems negative except as described per history of present illness  EKGs/Labs/Other Studies Reviewed:      Recent Labs: 01/05/2022: ALT 27; BUN 14; Creatinine 0.73; Hemoglobin 14.0; Platelet Count 194; Potassium 4.1; Sodium 137  Recent Lipid Panel    Component Value Date/Time   CHOL 86  (L) 08/18/2020 1139   TRIG 112 08/18/2020 1139   HDL 30 (L) 08/18/2020 1139   CHOLHDL 2.9 08/18/2020 1139   LDLCALC 35 08/18/2020 1139    Physical Exam:    VS:  BP 134/76 (BP Location: Left Arm, Patient Position: Sitting)   Pulse 65   Ht 6' (1.829 m)   Wt 201 lb 12.8 oz (91.5 kg)   SpO2 94%   BMI 27.37 kg/m     Wt Readings from Last 3 Encounters:  02/19/22 201 lb 12.8 oz (91.5 kg)  01/05/22 200 lb 14.4 oz (91.1 kg)  08/19/21 198 lb (89.8 kg)     GEN:  Well nourished, well developed in no acute distress HEENT: Normal NECK: No JVD; No carotid bruits LYMPHATICS: No lymphadenopathy CARDIAC: RRR, no murmurs, no rubs, no gallops RESPIRATORY:  Clear to auscultation without rales, wheezing or rhonchi  ABDOMEN: Soft, non-tender, non-distended MUSCULOSKELETAL:  No edema; No deformity  SKIN: Warm and dry LOWER EXTREMITIES: no swelling NEUROLOGIC:  Alert and oriented x 3 PSYCHIATRIC:  Normal affect   ASSESSMENT:    1. Peripheral vascular disease ([place])   2. Essential hypertension   3. Primary hypertension   4. Dyslipidemia   5. Lymphoma in remission [facility])    PLAN:    In order of problems listed above:  Peripheral vascular disease.  Time to repeat his carotic ultrasounds which I will do.  In the meantime continue antiplatelet therapy and statin. Essential hypertension: Blood pressure well controlled continue present management. Dyslipidemia.  I did review his K PN which show me his LDL 35 and HDL 30.  He is on moderate intensity statin form of Crestor 10 which I will continue.  Fasting lipid profile will be done Lymphoma in remission.  He has been followed by oncology team   Medication Adjustments/Labs and Tests Ordered: Current medicines are reviewed at length with the patient today.  Concerns regarding medicines are outlined above.  No orders of the defined types were placed in this encounter.  Medication changes: No orders of the defined types were placed in this  encounter.   Signed, Park Liter, MD, [facility] 02/19/2022 1:33 PM    [facility]

## 2022-02-19 NOTE — Patient Instructions (Signed)
Medication Instructions:  Your physician recommends that you continue on your current medications as directed. Please refer to the Current Medication list given to you today.  *If you need a refill on your cardiac medications before your next appointment, please call your pharmacy*   Lab Work: Your physician recommends that you return for lab work in: [place] today If you have labs (blood work) drawn today and your tests are completely normal, you will receive your results only by: [place] (if you have MyChart) OR A paper copy in the mail If you have any lab test that is abnormal or we need to change your treatment, we will call you to review the results.   Testing/Procedures: Your physician has requested that you have a carotid duplex. This test is an ultrasound of the carotid arteries in your neck. It looks at blood flow through these arteries that supply the brain with blood. Allow one hour for this exam. There are no restrictions or special instructions.    Follow-Up: At [facility], you and your health needs are our priority.  As part of our continuing mission to provide you with exceptional heart care, we have created designated Provider Care Teams.  These Care Teams include your primary Cardiologist (physician) and Advanced Practice Providers (APPs -  Physician Assistants and Nurse Practitioners) who all work together to provide you with the care you need, when you need it.  We recommend signing up for the patient portal called "MyChart".  Sign up information is provided on this After Visit Summary.  MyChart is used to connect with patients for Virtual Visits (Telemedicine).  Patients are able to view lab/test results, encounter notes, upcoming appointments, etc.  Non-urgent messages can be sent to your provider as well.   To learn more about what you can do with MyChart, go to [email or web address].    Your next appointment:   6 month(s)  The format for  your next appointment:   In Person  Provider:   Jenne Campus, MD    Other Instructions   Important Information About Sugar

## 2022-02-19 NOTE — Addendum Note (Signed)
Addended by: Darrel Reach on: 02/19/2022 01:43 PM   Modules accepted: Orders

## 2022-02-20 LAB — LIPID PANEL
Chol/HDL Ratio: 2.8 ratio (ref 0.0–5.0)
Cholesterol, Total: 96 mg/dL — ABNORMAL LOW (ref 100–199)
HDL: 34 mg/dL — ABNORMAL LOW (ref >39)
LDL Chol Calc (NIH): 39 mg/dL (ref 0–99)
Triglycerides: 127 mg/dL (ref 0–149)
VLDL Cholesterol Cal: 23 mg/dL (ref 5–40)

## 2022-02-25 ENCOUNTER — Telehealth: Payer: Self-pay

## 2022-02-25 NOTE — Telephone Encounter (Signed)
Results reviewed with pt as per Dr. Krasowski's note.  Pt verbalized understanding and had no additional questions. Routed to PCP  

## 2022-03-05 ENCOUNTER — Other Ambulatory Visit: Payer: Self-pay

## 2022-03-05 DIAGNOSIS — Z0000 Encounter for general adult medical examination without abnormal findings: Secondary | ICD-10-CM | POA: Diagnosis not present

## 2022-03-05 DIAGNOSIS — I6529 Occlusion and stenosis of unspecified carotid artery: Secondary | ICD-10-CM | POA: Diagnosis not present

## 2022-03-05 DIAGNOSIS — I739 Peripheral vascular disease, unspecified: Secondary | ICD-10-CM | POA: Diagnosis not present

## 2022-03-05 DIAGNOSIS — E559 Vitamin D deficiency, unspecified: Secondary | ICD-10-CM | POA: Diagnosis not present

## 2022-03-05 DIAGNOSIS — N400 Enlarged prostate without lower urinary tract symptoms: Secondary | ICD-10-CM | POA: Diagnosis not present

## 2022-03-05 DIAGNOSIS — Z1331 Encounter for screening for depression: Secondary | ICD-10-CM | POA: Diagnosis not present

## 2022-03-05 DIAGNOSIS — E7800 Pure hypercholesterolemia, unspecified: Secondary | ICD-10-CM | POA: Diagnosis not present

## 2022-03-05 DIAGNOSIS — Z6823 Body mass index (BMI) 23.0-23.9, adult: Secondary | ICD-10-CM | POA: Diagnosis not present

## 2022-03-05 DIAGNOSIS — I10 Essential (primary) hypertension: Secondary | ICD-10-CM | POA: Diagnosis not present

## 2022-03-06 LAB — CBC WITH DIFFERENTIAL/PLATELET
Basophils Absolute: 0.1 10*3/uL (ref 0.0–0.2)
Basos: 1 %
EOS (ABSOLUTE): 0.1 10*3/uL (ref 0.0–0.4)
Eos: 2 %
Hematocrit: 41.5 % (ref 37.5–51.0)
Hemoglobin: 14.2 g/dL (ref 13.0–17.7)
Immature Grans (Abs): 0 10*3/uL (ref 0.0–0.1)
Immature Granulocytes: 0 %
Lymphocytes Absolute: 1.6 10*3/uL (ref 0.7–3.1)
Lymphs: 27 %
MCH: 30.7 pg (ref 26.6–33.0)
MCHC: 34.2 g/dL (ref 31.5–35.7)
MCV: 90 fL (ref 79–97)
Monocytes Absolute: 0.5 10*3/uL (ref 0.1–0.9)
Monocytes: 9 %
Neutrophils Absolute: 3.8 10*3/uL (ref 1.4–7.0)
Neutrophils: 61 %
Platelets: 211 10*3/uL (ref 150–450)
RBC: 4.63 x10E6/uL (ref 4.14–5.80)
RDW: 12.8 % (ref 11.6–15.4)
WBC: 6.1 10*3/uL (ref 3.4–10.8)

## 2022-03-06 LAB — COMPREHENSIVE METABOLIC PANEL
ALT: 30 IU/L (ref 0–44)
AST: 29 IU/L (ref 0–40)
Albumin/Globulin Ratio: 2.2 (ref 1.2–2.2)
Albumin: 4.7 g/dL (ref 3.9–4.9)
Alkaline Phosphatase: 96 IU/L (ref 44–121)
BUN/Creatinine Ratio: 10 (ref 10–24)
BUN: 9 mg/dL (ref 8–27)
Bilirubin Total: 0.8 mg/dL (ref 0.0–1.2)
CO2: 26 mmol/L (ref 20–29)
Calcium: 9.7 mg/dL (ref 8.6–10.2)
Chloride: 101 mmol/L (ref 96–106)
Creatinine, Ser: 0.87 mg/dL (ref 0.76–1.27)
Globulin, Total: 2.1 g/dL (ref 1.5–4.5)
Glucose: 107 mg/dL — ABNORMAL HIGH (ref 70–99)
Potassium: 4.7 mmol/L (ref 3.5–5.2)
Sodium: 139 mmol/L (ref 134–144)
Total Protein: 6.8 g/dL (ref 6.0–8.5)
eGFR: 93 mL/min/{1.73_m2} (ref >59)

## 2022-03-06 LAB — VITAMIN D 25 HYDROXY (VIT D DEFICIENCY, FRACTURES): Vit D, 25-Hydroxy: 41.5 ng/mL (ref 30.0–100.0)

## 2022-03-06 LAB — HGB A1C W/O EAG: Hgb A1c MFr Bld: 5.9 % — ABNORMAL HIGH (ref 4.8–5.6)

## 2022-03-06 LAB — TSH: TSH: 1.57 u[IU]/mL (ref 0.450–4.500)

## 2022-03-08 ENCOUNTER — Ambulatory Visit: Payer: PPO | Attending: Cardiology

## 2022-03-08 DIAGNOSIS — I739 Peripheral vascular disease, unspecified: Secondary | ICD-10-CM

## 2022-03-08 DIAGNOSIS — I6523 Occlusion and stenosis of bilateral carotid arteries: Secondary | ICD-10-CM | POA: Diagnosis not present

## 2022-03-09 ENCOUNTER — Telehealth: Payer: Self-pay

## 2022-03-09 DIAGNOSIS — Z1211 Encounter for screening for malignant neoplasm of colon: Secondary | ICD-10-CM | POA: Diagnosis not present

## 2022-03-09 NOTE — Telephone Encounter (Signed)
Patient notified of results via mychart

## 2022-03-09 NOTE — Telephone Encounter (Signed)
-----   Message from Richardo Priest, MD sent at 03/09/2022  9:37 AM EDT ----- Good result very mild blockage in the carotids bilaterally.

## 2022-03-16 DIAGNOSIS — G4700 Insomnia, unspecified: Secondary | ICD-10-CM | POA: Diagnosis not present

## 2022-03-17 NOTE — Telephone Encounter (Signed)
Results reviewed with pt as per Dr. Munley's note.  Pt verbalized understanding and had no additional questions. Routed to PCP  

## 2022-03-17 NOTE — Telephone Encounter (Signed)
Patient is calling asking to the nurse to give him a call to go over his results. Please advise

## 2022-03-23 ENCOUNTER — Other Ambulatory Visit: Payer: Self-pay | Admitting: Cardiology

## 2022-03-23 DIAGNOSIS — I739 Peripheral vascular disease, unspecified: Secondary | ICD-10-CM

## 2022-03-23 DIAGNOSIS — C8378 Burkitt lymphoma, lymph nodes of multiple sites: Secondary | ICD-10-CM

## 2022-03-30 DIAGNOSIS — N401 Enlarged prostate with lower urinary tract symptoms: Secondary | ICD-10-CM | POA: Diagnosis not present

## 2022-03-30 DIAGNOSIS — R972 Elevated prostate specific antigen [PSA]: Secondary | ICD-10-CM | POA: Diagnosis not present

## 2022-05-07 ENCOUNTER — Ambulatory Visit (INDEPENDENT_AMBULATORY_CARE_PROVIDER_SITE_OTHER): Payer: PPO | Admitting: Internal Medicine

## 2022-05-07 ENCOUNTER — Encounter: Payer: Self-pay | Admitting: Internal Medicine

## 2022-05-07 VITALS — BP 110/60 | HR 73 | Temp 97.9°F | Resp 16 | Ht 73.0 in | Wt 202.0 lb

## 2022-05-07 DIAGNOSIS — E7800 Pure hypercholesterolemia, unspecified: Secondary | ICD-10-CM

## 2022-05-07 DIAGNOSIS — G4700 Insomnia, unspecified: Secondary | ICD-10-CM | POA: Diagnosis not present

## 2022-05-07 DIAGNOSIS — I10 Essential (primary) hypertension: Secondary | ICD-10-CM

## 2022-05-07 NOTE — Progress Notes (Signed)
Office Visit  Subjective   Patient ID: Erik Vaughn   DOB: [birth year]   Age: 71 y.o.   MRN: [id number]   Chief Complaint Chief Complaint  Patient presents with   Follow-up    Hypertension     History of Present Illness The patient returns today to discuss his insomnia. I saw 2 months ago where we increased his trazodone from 150mg  to 200mg  nightly.  Over the interim, he states this is helping with his sleep.  He is now sleeping 7-8 hours at night.  He still gets up to urinate but now is able to go back to sleep.  He denies any side effects from the trazodone.  He was having problems with his mind racing at night when he would lie down but this has improved greatly.  He was having previous problems where he would take a few hours to go to sleep and once he is asleep, he will wake up frequently.  Now he is able to go right to sleep as sleep as described above.  The patient denies any snoring.   The patient is a 71 year old Caucasian/White male who presents for a follow-up evaluation of hypertension.  Since his last visit, he has not had any problems.   He was diagnosed with HTN around 2015. The patient has been checking his blood pressure at home. The patient's blood pressure has ranged 130's. The patient's current medications include: amlodipine 10mg  daily and lisinopril 20mg  daily. The patient has been tolerating his medications well. The patient denies any headache, visual changes, dizziness, lightheadness, chest pain, shortness of breath, weakness/numbness, and edema.   Erik Vaughn returns today for routine followup on his cholesterol. He was diagnosed with hypercholesterolemia around 2015 as well. Overall, he states he is doing well and is without any complaints or problems at this time. He specifically denies abdominal pain, nausea, vomiting, diarrhea, myalgias, and fatigue. He remains on dietary management as well as the following cholesterol lowering medications rosuvastatin 10 mg  tablet. He did see Dr. Raliegh Vaughn in 01/2022 and they did his FLP check at that time and his LDL was 39.     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  Eyes:  Negative for blurred vision and double vision.  Respiratory:  Negative for cough and shortness of breath.   Cardiovascular:  Negative for chest pain, palpitations and leg swelling.  Gastrointestinal:  Negative for abdominal pain, constipation, diarrhea, heartburn, nausea and vomiting.  Musculoskeletal:  Negative for myalgias.  Skin:  Negative for itching and rash.  Neurological:  Negative for dizziness, weakness and headaches.  Psychiatric/Behavioral:  The patient does not have insomnia.        Objective:    Vitals BP 110/60   Pulse 73   Temp 97.9 F (36.6 C)   Resp 16  Ht 6\' 1"  (1.854 m)   Wt 202 lb (91.6 kg)   SpO2 96%   BMI 26.65 kg/m    Physical Examination Physical Exam Constitutional:      Appearance: Normal appearance. He is not ill-appearing.  Cardiovascular:     Rate and Rhythm: Normal rate and regular rhythm.     Pulses: Normal pulses.     Heart sounds: Normal heart sounds. No murmur heard.    No friction rub. No gallop.  Pulmonary:     Effort: Pulmonary effort is normal. No respiratory distress.     Breath sounds: Normal breath sounds. No wheezing, rhonchi or rales.   Abdominal:     General: Abdomen is flat. Bowel sounds are normal. There is no distension.     Palpations: Abdomen is soft.     Tenderness: There is no abdominal tenderness.  Musculoskeletal:     Right lower leg: No edema.     Left lower leg: No edema.  Skin:    General: Skin is warm and dry.     Findings: No rash.  Neurological:     General: No focal deficit present.     Mental Status: He is alert and oriented to person, place, and time.  Psychiatric:        Mood and Affect: Mood normal.        Behavior: Behavior normal.        Assessment & Plan:   Essential hypertension His BP is doing well.  We will continue to monitor.  Hypercholesterolemia He did see Dr. Raliegh Vaughn in 01/2022 and his cholesterol is controlled.   I want him to continue his statin, eat healthy and exeercise.  Insomnia His insomnia is doing well on his current dose.  We will continue to monitor.    Return in about 3 months (around 08/06/2022).   Erik Roger, MD

## 2022-05-07 NOTE — Assessment & Plan Note (Signed)
His insomnia is doing well on his current dose.  We will continue to monitor.

## 2022-05-07 NOTE — Assessment & Plan Note (Signed)
His BP is doing well.  We will continue to monitor.

## 2022-05-07 NOTE — Assessment & Plan Note (Signed)
He did see Dr. Raliegh Ip in 01/2022 and his cholesterol is controlled.   I want him to continue his statin, eat healthy and exeercise.

## 2022-06-17 ENCOUNTER — Other Ambulatory Visit: Payer: Self-pay

## 2022-06-17 MED ORDER — TRAZODONE HCL 100 MG PO TABS: 200.0000 mg | ORAL_TABLET | Freq: Every day | ORAL | 2 refills | Status: DC

## 2022-08-04 ENCOUNTER — Encounter: Payer: Self-pay | Admitting: Internal Medicine

## 2022-08-04 ENCOUNTER — Ambulatory Visit: Payer: PPO | Admitting: Internal Medicine

## 2022-08-04 VITALS — BP 128/68 | HR 63 | Temp 98.1°F | Resp 18 | Ht 72.0 in | Wt 205.2 lb

## 2022-08-04 DIAGNOSIS — I10 Essential (primary) hypertension: Secondary | ICD-10-CM

## 2022-08-04 DIAGNOSIS — I739 Peripheral vascular disease, unspecified: Secondary | ICD-10-CM | POA: Diagnosis not present

## 2022-08-04 DIAGNOSIS — G4700 Insomnia, unspecified: Secondary | ICD-10-CM

## 2022-08-04 NOTE — Assessment & Plan Note (Signed)
His BP is well controlled.  We will continue his current meds.

## 2022-08-04 NOTE — Progress Notes (Signed)
Office Visit  Subjective   Patient ID: Erik Vaughn   DOB: 03-18-1951   Age: 72 y.o.   MRN: [id number]   Chief Complaint Chief Complaint  Patient presents with   Follow-up    26M     History of Present Illness The patient is a 72 year old Caucasian/White male who presents for a follow-up evaluation of hypertension.  Since his last visit, he has not had any problems.   He was diagnosed with HTN around 2015. The patient has not been checking his blood pressure at home. The patient's current medications include: amlodipine '10mg'$  daily and lisinopril '20mg'$  daily. The patient has been tolerating his medications well. The patient denies any headache, visual changes, dizziness, lightheadness, chest pain, shortness of breath, weakness/numbness, and edema.   The patient returns today for followup of his anxiety and insomnia. He had some anxiousness when he started chemotherapy for his Burkett's Lymphoma in 2017. He was having stressors with his wife having some health problems and he states he was concerned about his BPH with Dr. Nila Nephew. His biggest complaint last year was his insomnia where I increased his trazodone from '150mg'$  to '200mg'$  nightly.  Over the interim, he states this is helping with his sleep.  He is now sleeping 7-8 hours at night.  He denies any anxiety or depression.  He denies any side effects from the trazodone.  He was having problems with his mind racing at night when he would lie down but this has improved greatly.  He was having previous problems where he would take a few hours to go to sleep and once he is asleep, he will wake up frequently.  Now he is able to go right to sleep as sleep as described above.  The patient denies any snoring.  The patient also has a history of HCL where he has peripheral arterial disease with carotid stenosis.  He is currently followed by Dr. Raliegh Ip who did a FLP on him in 01/2022 and his cholesterol was controlled.  He did have a repeat carotid US on 03/08/2022  and this showed a right ICA consistent with a 1-39% stenosis and a left ICA consistent with a 1-39% stenosis.   He remains on risk factor modification and is on Plavix daily. They also did ABI's on his legs from what he is telling me and he has some PAD in his legs as well.  He remains on rosuvastatin.       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 Unknown strenght, Disp: , Rfl:    clopidogrel (PLAVIX) 75 MG tablet, TAKE 1 TABLET BY MOUTH EVERY EVENING., Disp: 90 tablet, Rfl: 2   lisinopril (ZESTRIL) 20 MG tablet, TAKE 1 TABLET BY MOUTH EVERY EVENING., Disp: 90 tablet, Rfl: 2   rosuvastatin (CRESTOR) 10 MG tablet, TAKE 1 TABLET  BY MOUTH DAILY., Disp: 90 tablet, Rfl: 2   tamsulosin (FLOMAX) 0.4 MG CAPS capsule, Take 0.4 mg by mouth daily., Disp: , Rfl:    traZODone (DESYREL) 100 MG tablet, Take 2 tablets (200 mg total) by mouth at bedtime., Disp: 60 tablet, Rfl: 2   Review of Systems Review of Systems  Constitutional:  Negative for chills and fever.  Eyes:  Negative for blurred vision and double vision.  Respiratory:  Negative for cough and shortness of breath.   Cardiovascular:  Negative for chest pain, palpitations and leg swelling.  Gastrointestinal:  Negative for abdominal pain, constipation, diarrhea, nausea and vomiting.  Musculoskeletal:  Negative for myalgias.   Neurological:  Negative for dizziness, weakness and headaches.  Psychiatric/Behavioral:  Negative for depression. The patient is not nervous/anxious and does not have insomnia.        Objective:    Vitals BP 128/68 (BP Location: Left Arm, Patient Position: Sitting, Cuff Size: Normal)   Pulse 63   Temp 98.1 F (36.7 C) (Temporal)   Resp 18   Ht 6' (1.829 m)   Wt 205 lb 3.2 oz (93.1 kg)   SpO2 96%   BMI 27.83 kg/m    Physical Examination Physical Exam Constitutional:      Appearance: Normal appearance. He is not ill-appearing.  Neck:     Vascular: No carotid bruit.  Cardiovascular:     Rate and Rhythm: Normal rate and regular rhythm.     Pulses: Normal pulses.     Heart sounds: No murmur heard.    No friction rub. No gallop.  Pulmonary:     Effort: Pulmonary effort is normal. No respiratory distress.     Breath sounds: No wheezing, rhonchi or rales.  Abdominal:     General: Abdomen is flat. Bowel sounds are normal. There is no distension.     Palpations: Abdomen is soft.     Tenderness: There is no abdominal tenderness.  Musculoskeletal:     Right lower leg: No edema.     Left lower leg: No edema.  Skin:    General: Skin is warm and dry.     Findings: No rash.  Neurological:     General: No focal deficit present.     Mental Status: He is alert and oriented to person, place, and time.  Psychiatric:        Mood and Affect: Mood normal.        Behavior: Behavior normal.        Assessment & Plan:   PAD (peripheral artery disease) ([place]) He will continue on risk factor modification.  He is on a statin and he is on plavix.  I reviewed his carotid US from this past year.  We will check his FLP on his next visit when he is fasting.  Essential hypertension His BP is well controlled.  We will continue his current meds.  Insomnia The increase in trazodone has helped.  He denies any problems with anxiety today.    Return in about 3 months (around 11/04/2022).    Townsend Roger, MD

## 2022-08-04 NOTE — Assessment & Plan Note (Addendum)
He will continue on risk factor modification.  He is on a statin and he is on plavix.  I reviewed his carotid US from this past year.  We will check his FLP on his next visit when he is fasting.

## 2022-08-04 NOTE — Assessment & Plan Note (Signed)
The increase in trazodone has helped.  He denies any problems with anxiety today.

## 2022-08-20 ENCOUNTER — Ambulatory Visit: Payer: PPO | Attending: Cardiology | Admitting: Cardiology

## 2022-08-20 ENCOUNTER — Encounter: Payer: Self-pay | Admitting: Cardiology

## 2022-08-20 VITALS — BP 130/64 | HR 57 | Ht 72.0 in | Wt 207.0 lb

## 2022-08-20 DIAGNOSIS — I10 Essential (primary) hypertension: Secondary | ICD-10-CM

## 2022-08-20 DIAGNOSIS — R0989 Other specified symptoms and signs involving the circulatory and respiratory systems: Secondary | ICD-10-CM

## 2022-08-20 DIAGNOSIS — C8590 Non-Hodgkin lymphoma, unspecified, unspecified site: Secondary | ICD-10-CM | POA: Diagnosis not present

## 2022-08-20 DIAGNOSIS — I350 Nonrheumatic aortic (valve) stenosis: Secondary | ICD-10-CM

## 2022-08-20 DIAGNOSIS — I739 Peripheral vascular disease, unspecified: Secondary | ICD-10-CM

## 2022-08-20 DIAGNOSIS — E785 Hyperlipidemia, unspecified: Secondary | ICD-10-CM

## 2022-08-20 NOTE — Addendum Note (Signed)
Addended by: Jacobo Forest D on: 08/20/2022 11:57 AM   Modules accepted: Orders

## 2022-08-20 NOTE — Patient Instructions (Addendum)
Medication Instructions:  Your physician recommends that you continue on your current medications as directed. Please refer to the Current Medication list given to you today.  *If you need a refill on your cardiac medications before your next appointment, please call your pharmacy*   Lab Work: None Ordered If you have labs (blood work) drawn today and your tests are completely normal, you will receive your results only by: [place] (if you have MyChart) OR A paper copy in the mail If you have any lab test that is abnormal or we need to change your treatment, we will call you to review the results.   Testing/Procedures: Your physician has requested that you have an echocardiogram. Echocardiography is a painless test that uses sound waves to create images of your heart. It provides your doctor with information about the size and shape of your heart and how well your heart's chambers and valves are working. This procedure takes approximately one hour. There are no restrictions for this procedure. Please do NOT wear cologne, perfume, aftershave, or lotions (deodorant is allowed). Please arrive 15 minutes prior to your appointment time.   NOVEMBER 2024 Your physician has requested that you have a carotid duplex. This test is an ultrasound of the carotid arteries in your neck. It looks at blood flow through these arteries that supply the brain with blood. Allow one hour for this exam. There are no restrictions or special instructions.    Follow-Up: At [facility], you and your health needs are our priority.  As part of our continuing mission to provide you with exceptional heart care, we have created designated Provider Care Teams.  These Care Teams include your primary Cardiologist (physician) and Advanced Practice Providers (APPs -  Physician Assistants and Nurse Practitioners) who all work together to provide you with the care you need, when you need it.  We recommend signing up for  the patient portal called "MyChart".  Sign up information is provided on this After Visit Summary.  MyChart is used to connect with patients for Virtual Visits (Telemedicine).  Patients are able to view lab/test results, encounter notes, upcoming appointments, etc.  Non-urgent messages can be sent to your provider as well.   To learn more about what you can do with MyChart, go to [email or web address].    Your next appointment:   8 month(s)  The format for your next appointment:   In Person  Provider:   Jenne Campus, MD    Other Instructions NA

## 2022-08-20 NOTE — Progress Notes (Signed)
Cardiology Office Note:    Date:  08/20/2022   ID:  Erik Vaughn, DOB 1950/12/28, MRN [id number]  PCP:  Townsend Roger, MD  Cardiologist:  Jenne Campus, MD    Referring MD: Nona Dell, Corene Cornea, MD   Chief Complaint  Patient presents with   Follow-up  Doing well  History of Present Illness:    Erik Vaughn is a 72 y.o. male past medical history significant for aortic stenosis which is only mild, essential hypertension, peripheral vascular disease in form of carotid arterial stenosis up to 39% stenosis bilaterally, dyslipidemia.  He comes today to months for follow-up.  Overall doing very well.  Denies have any chest pain tightness squeezing pressure burning chest.  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 ([place]) 03/08/2016   Carotid stenosis    Claudication ([place]) 04/28/2015   Dyslipidemia 04/28/2015   Encounter for antineoplastic chemotherapy    Essential hypertension 04/28/2015   Murmur 02/07/2016   Neoplasm related pain    PAD (peripheral artery disease) ([place]) left leg    Past Surgical History:  Procedure Laterality Date   IR GENERIC HISTORICAL  12/31/2015   IR FLUORO GUIDE CV LINE RIGHT 12/31/2015 Arne Cleveland, MD [facility]   IR GENERIC HISTORICAL  12/31/2015   IR US GUIDE VASC ACCESS RIGHT 12/31/2015 Arne Cleveland, MD [facility]   IR GENERIC HISTORICAL  07/20/2016   IR REMOVAL TUN ACCESS W/ PORT W/O FL MOD SED 07/20/2016 Jacqulynn Cadet, MD [facility]   left salivary Left    removed    Current Medications: Current Meds  Medication Sig   amLODipine (NORVASC) 10 MG tablet TAKE 1 TABLET BY MOUTH EVERY EVENING.   b complex vitamins tablet Take 1 tablet by mouth daily. Unknown strenght   clopidogrel (PLAVIX) 75 MG tablet TAKE 1 TABLET BY MOUTH EVERY EVENING.   lisinopril (ZESTRIL) 20 MG tablet TAKE 1 TABLET BY  MOUTH EVERY EVENING.   rosuvastatin (CRESTOR) 10 MG tablet TAKE 1 TABLET BY MOUTH DAILY.   tamsulosin (FLOMAX) 0.4 MG CAPS capsule Take 0.4 mg by mouth daily.   traZODone (DESYREL) 100 MG tablet Take 2 tablets (200 mg total) by mouth at bedtime.     Allergies:   Patient has no known allergies.   Social History   Socioeconomic History   Marital status: Married    Spouse name: Not on file   Number of children: Not on file   Years of education: Not on file   Highest education level: Not on file  Occupational History   Occupation: meter specialist  Tobacco Use   Smoking status: Former    Packs/day: 2.00    Years: 16.00    Additional pack years: 0.00    Total pack years: 32.00    Types: Cigarettes    Quit date: 12/22/2005    Years since quitting: 16.6   Smokeless tobacco: Current    Types: Chew  Vaping Use   Vaping Use: Never used  Substance and Sexual Activity   Alcohol use: No   Drug use: No   Sexual activity: Never  Other Topics Concern   Not on file  Social History Narrative   Not on file   Social Determinants of Health   Financial Resource Strain: Not on file  Food Insecurity: Not on file  Transportation Needs:  Not on file  Physical Activity: Not on file  Stress: Not on file  Social Connections: Not on file     Family History: The patient's family history includes Alzheimer's disease (age of onset: 79) in his father. ROS:   Please see the history of present illness.    All 14 point review of systems negative except as described per history of present illness  EKGs/Labs/Other Studies Reviewed:      Recent Labs: 03/05/2022: ALT 30; BUN 9; Creatinine, Ser 0.87; Hemoglobin 14.2; Platelets 211; Potassium 4.7; Sodium 139; TSH 1.570  Recent Lipid Panel    Component Value Date/Time   CHOL 96 (L) 02/19/2022 1347   TRIG 127 02/19/2022 1347   HDL 34 (L) 02/19/2022 1347   CHOLHDL 2.8 02/19/2022 1347   LDLCALC 39 02/19/2022 1347    Physical Exam:    VS:  BP  130/64 (BP Location: Left Arm, Patient Position: Sitting, Cuff Size: Normal)   Pulse (!) 57   Ht 6' (1.829 m)   Wt 207 lb (93.9 kg)   SpO2 96%   BMI 28.07 kg/m     Wt Readings from Last 3 Encounters:  08/20/22 207 lb (93.9 kg)  08/04/22 205 lb 3.2 oz (93.1 kg)  05/07/22 202 lb (91.6 kg)     GEN:  Well nourished, well developed in no acute distress HEENT: Normal NECK: No JVD; No carotid bruits LYMPHATICS: No lymphadenopathy CARDIAC: RRR, systolic ejection murmur grade 1/6 best heard right upper portion of the sternum, no rubs, no gallops RESPIRATORY:  Clear to auscultation without rales, wheezing or rhonchi  ABDOMEN: Soft, non-tender, non-distended MUSCULOSKELETAL:  No edema; No deformity  SKIN: Warm and dry LOWER EXTREMITIES: no swelling NEUROLOGIC:  Alert and oriented x 3 PSYCHIATRIC:  Normal affect   ASSESSMENT:    1. PAD (peripheral artery disease) ([place])   2. Essential hypertension   3. Dyslipidemia   4. Lymphoma in remission ([place])   5. Nonrheumatic aortic valve stenosis    PLAN:    In order of problems listed above:  Peripheral vascular disease.  Stable on antiplatelet therapy which I will continue.  He is up to 39% stenosis bilaterally in carotic arteries.  Continue monitoring. Essential hypertension blood pressure well-controlled continue present management: Dyslipidemia I did review K PN LDL 39 HDL 34 continue present management which include Crestor 10 mg daily. Aortic stenosis I scheduled him to have echocardiogram done.  Last assessment only mild   Medication Adjustments/Labs and Tests Ordered: Current medicines are reviewed at length with the patient today.  Concerns regarding medicines are outlined above.  No orders of the defined types were placed in this encounter.  Medication changes: No orders of the defined types were placed in this encounter.   Signed, Park Liter, MD, [facility] 08/20/2022 11:49 AM    [place]

## 2022-09-01 ENCOUNTER — Ambulatory Visit: Payer: PPO | Attending: Cardiology

## 2022-09-01 DIAGNOSIS — I350 Nonrheumatic aortic (valve) stenosis: Secondary | ICD-10-CM

## 2022-09-01 LAB — ECHOCARDIOGRAM COMPLETE
AR max vel: 1.68 cm2
AV Area VTI: 1.74 cm2
AV Area mean vel: 1.69 cm2
AV Mean grad: 13 mmHg
AV Peak grad: 24.3 mmHg
Ao pk vel: 2.47 m/s
S' Lateral: 3.5 cm

## 2022-09-02 ENCOUNTER — Telehealth: Payer: Self-pay | Admitting: Cardiology

## 2022-09-02 ENCOUNTER — Telehealth: Payer: Self-pay

## 2022-09-02 NOTE — Telephone Encounter (Signed)
Patient is returning call in regards to echo results. Requesting return call.

## 2022-09-02 NOTE — Telephone Encounter (Signed)
Patient notified of results.

## 2022-09-02 NOTE — Telephone Encounter (Signed)
-----   Message from Park Liter, MD sent at 09/02/2022 10:29 AM EDT ----- Echocardiogram showed preserved left ventricle ejection fraction, mild mitral valve regurgitation, dilated left atrium.

## 2022-09-16 ENCOUNTER — Other Ambulatory Visit: Payer: Self-pay | Admitting: Internal Medicine

## 2022-09-29 DIAGNOSIS — R972 Elevated prostate specific antigen [PSA]: Secondary | ICD-10-CM | POA: Diagnosis not present

## 2022-09-29 DIAGNOSIS — N401 Enlarged prostate with lower urinary tract symptoms: Secondary | ICD-10-CM | POA: Diagnosis not present

## 2022-11-08 ENCOUNTER — Encounter: Payer: Self-pay | Admitting: Internal Medicine

## 2022-11-08 ENCOUNTER — Ambulatory Visit: Payer: PPO | Admitting: Internal Medicine

## 2022-11-08 VITALS — BP 122/62 | HR 70 | Temp 97.9°F | Resp 16 | Ht 73.0 in | Wt 199.8 lb

## 2022-11-08 DIAGNOSIS — I10 Essential (primary) hypertension: Secondary | ICD-10-CM

## 2022-11-08 DIAGNOSIS — E7800 Pure hypercholesterolemia, unspecified: Secondary | ICD-10-CM

## 2022-11-08 NOTE — Assessment & Plan Note (Signed)
I am going to check his FLP today with goal LDL <70 with his history of PAD and carotid stenosis.

## 2022-11-08 NOTE — Progress Notes (Signed)
Office Visit  Subjective   Patient ID: Erik Vaughn   DOB: 1951-01-22   Age: 72 y.o.   MRN: [id number]   Chief Complaint Chief Complaint  Patient presents with   Follow-up     History of Present Illness The patient is a 72 year old Caucasian/White male who presents for a follow-up evaluation of hypertension.  Since his last visit, he has not had any problems.   He was diagnosed with HTN around 2015. The patient has not been checking his blood pressure at home. The patient's current medications include: amlodipine 10mg  daily and lisinopril 20mg  daily. The patient has been tolerating his medications well. The patient denies any headache, visual changes, dizziness, lightheadness, chest pain, shortness of breath, weakness/numbness, and edema.   The patient also has a history of HCL where he has peripheral arterial disease with carotid stenosis.  He is currently followed by Dr. Kirtland Bouchard who did a FLP on him in 01/2022 and his cholesterol was controlled.  He did have a repeat carotid US on 03/08/2022 and this showed a right ICA consistent with a 1-39% stenosis and a left ICA consistent with a 1-39% stenosis.   He remains on risk factor modification and is on Plavix daily. They also did ABI's on his legs from what he is telling me and he has some PAD in his legs as well.  Overall, he states he is doing well and is without any complaints or problems at this time. He specifically denies abdominal pain, nausea, vomiting, diarrhea, myalgias, and fatigue. He remains on dietary management as well as the following cholesterol lowering medications rosuvastatin 10 mg tablet. He is fasting in anticipation for labs today.       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 Unknown strenght, Disp: , Rfl:    clopidogrel (PLAVIX) 75 MG tablet, TAKE 1 TABLET BY MOUTH EVERY EVENING., Disp: 90 tablet, Rfl: 2   lisinopril (ZESTRIL) 20 MG tablet, TAKE 1 TABLET BY MOUTH EVERY EVENING., Disp: 90 tablet, Rfl: 2   rosuvastatin (CRESTOR) 10 MG tablet, TAKE 1 TABLET BY MOUTH DAILY., Disp: 90 tablet, Rfl: 2   tamsulosin (FLOMAX) 0.4 MG CAPS capsule, Take 0.4 mg by mouth daily., Disp: , Rfl:    traZODone (DESYREL) 100 MG tablet, TAKE 2 TABLETS BY MOUTH AT BEDTIME., Disp: 60 tablet, Rfl: 1   Review of Systems Review of Systems  Constitutional:  Negative for chills, fever and malaise/fatigue.  Eyes:  Negative for blurred vision and double vision.  Respiratory:  Negative for wheezing.   Cardiovascular:  Negative for chest pain, palpitations and leg swelling.  Gastrointestinal:  Negative for abdominal pain, constipation, diarrhea, nausea and vomiting.  Musculoskeletal:  Negative for myalgias.  Neurological:  Negative for dizziness, weakness and headaches.       Objective:    Vitals  BP 122/62   Pulse 70   Temp 97.9 F (36.6 C)   Resp 16   Ht 6\' 1"  (1.854 m)   Wt 199 lb 12.8 oz (90.6 kg)   SpO2 94%   BMI 26.36 kg/m    Physical Examination Physical Exam Constitutional:      Appearance: Normal appearance. He is not ill-appearing.  Cardiovascular:     Rate and Rhythm: Normal rate and regular rhythm.     Pulses: Normal pulses.     Heart sounds: No murmur heard.    No friction rub. No gallop.  Pulmonary:     Effort: Pulmonary effort is normal. No respiratory distress.     Breath sounds: No  wheezing, rhonchi or rales.  Abdominal:     General: Abdomen is flat. Bowel sounds are normal. There is no distension.     Palpations: Abdomen is soft.     Tenderness: There is no abdominal tenderness.  Musculoskeletal:     Right lower leg: No edema.     Left lower leg: No edema.  Skin:    General: Skin is warm and dry.     Findings: No rash.  Neurological:     Mental Status: He is alert.        Assessment & Plan:   Essential hypertension His BP is well controlled.  We will continue his current meds.  Hypercholesterolemia I am going to check his FLP today with goal LDL <70 with his history of PAD and carotid stenosis.      Return in about 4 months (around 03/10/2023) for annual.   Crist Fat, MD

## 2022-11-08 NOTE — Assessment & Plan Note (Signed)
His BP is well controlled.  We will continue his current meds. 

## 2022-11-09 LAB — CMP14 + ANION GAP
ALT: 25 IU/L (ref 0–44)
AST: 25 IU/L (ref 0–40)
Albumin/Globulin Ratio: 1.9
Albumin: 4.4 g/dL (ref 3.9–4.9)
Alkaline Phosphatase: 103 IU/L (ref 44–121)
Anion Gap: 15 mmol/L (ref 10.0–18.0)
BUN/Creatinine Ratio: 15 (ref 10–24)
BUN: 13 mg/dL (ref 8–27)
Bilirubin Total: 0.5 mg/dL (ref 0.0–1.2)
CO2: 24 mmol/L (ref 20–29)
Calcium: 9.4 mg/dL (ref 8.6–10.2)
Chloride: 102 mmol/L (ref 96–106)
Creatinine, Ser: 0.85 mg/dL (ref 0.76–1.27)
Globulin, Total: 2.3 g/dL (ref 1.5–4.5)
Glucose: 108 mg/dL — ABNORMAL HIGH (ref 70–99)
Potassium: 5 mmol/L (ref 3.5–5.2)
Sodium: 141 mmol/L (ref 134–144)
Total Protein: 6.7 g/dL (ref 6.0–8.5)
eGFR: 93 mL/min/{1.73_m2} (ref >59)

## 2022-11-09 LAB — LIPID PANEL
Chol/HDL Ratio: 2.7 ratio (ref 0.0–5.0)
Cholesterol, Total: 96 mg/dL — ABNORMAL LOW (ref 100–199)
HDL: 36 mg/dL — ABNORMAL LOW (ref >39)
LDL Chol Calc (NIH): 36 mg/dL (ref 0–99)
Triglycerides: 134 mg/dL (ref 0–149)
VLDL Cholesterol Cal: 24 mg/dL (ref 5–40)

## 2022-11-30 ENCOUNTER — Other Ambulatory Visit: Payer: Self-pay | Admitting: Internal Medicine

## 2022-12-21 ENCOUNTER — Other Ambulatory Visit: Payer: Self-pay | Admitting: Cardiology

## 2022-12-21 DIAGNOSIS — C8378 Burkitt lymphoma, lymph nodes of multiple sites: Secondary | ICD-10-CM

## 2022-12-21 DIAGNOSIS — I739 Peripheral vascular disease, unspecified: Secondary | ICD-10-CM

## 2022-12-31 ENCOUNTER — Other Ambulatory Visit: Payer: Self-pay | Admitting: Internal Medicine

## 2023-01-06 ENCOUNTER — Other Ambulatory Visit: Payer: Self-pay

## 2023-01-06 DIAGNOSIS — C8378 Burkitt lymphoma, lymph nodes of multiple sites: Secondary | ICD-10-CM

## 2023-01-07 ENCOUNTER — Other Ambulatory Visit: Payer: Self-pay

## 2023-01-07 ENCOUNTER — Inpatient Hospital Stay: Payer: PPO

## 2023-01-07 ENCOUNTER — Inpatient Hospital Stay: Payer: PPO | Attending: Hematology | Admitting: Hematology

## 2023-01-07 VITALS — BP 153/77 | HR 63 | Temp 97.5°F | Resp 17 | Wt 205.4 lb

## 2023-01-07 DIAGNOSIS — Z8042 Family history of malignant neoplasm of prostate: Secondary | ICD-10-CM | POA: Insufficient documentation

## 2023-01-07 DIAGNOSIS — Z79899 Other long term (current) drug therapy: Secondary | ICD-10-CM | POA: Insufficient documentation

## 2023-01-07 DIAGNOSIS — Z800 Family history of malignant neoplasm of digestive organs: Secondary | ICD-10-CM | POA: Diagnosis not present

## 2023-01-07 DIAGNOSIS — R5383 Other fatigue: Secondary | ICD-10-CM | POA: Insufficient documentation

## 2023-01-07 DIAGNOSIS — C8378 Burkitt lymphoma, lymph nodes of multiple sites: Secondary | ICD-10-CM

## 2023-01-07 DIAGNOSIS — Z87891 Personal history of nicotine dependence: Secondary | ICD-10-CM | POA: Insufficient documentation

## 2023-01-07 DIAGNOSIS — I10 Essential (primary) hypertension: Secondary | ICD-10-CM | POA: Diagnosis not present

## 2023-01-07 DIAGNOSIS — T451X5A Adverse effect of antineoplastic and immunosuppressive drugs, initial encounter: Secondary | ICD-10-CM | POA: Insufficient documentation

## 2023-01-07 DIAGNOSIS — M549 Dorsalgia, unspecified: Secondary | ICD-10-CM | POA: Insufficient documentation

## 2023-01-07 DIAGNOSIS — E785 Hyperlipidemia, unspecified: Secondary | ICD-10-CM | POA: Diagnosis not present

## 2023-01-07 DIAGNOSIS — C8376 Burkitt lymphoma, intrapelvic lymph nodes: Secondary | ICD-10-CM | POA: Diagnosis not present

## 2023-01-07 DIAGNOSIS — Z803 Family history of malignant neoplasm of breast: Secondary | ICD-10-CM | POA: Diagnosis not present

## 2023-01-07 DIAGNOSIS — Z87440 Personal history of urinary (tract) infections: Secondary | ICD-10-CM | POA: Insufficient documentation

## 2023-01-07 DIAGNOSIS — G620 Drug-induced polyneuropathy: Secondary | ICD-10-CM | POA: Insufficient documentation

## 2023-01-07 DIAGNOSIS — Z86018 Personal history of other benign neoplasm: Secondary | ICD-10-CM | POA: Diagnosis not present

## 2023-01-07 LAB — CBC WITH DIFFERENTIAL (CANCER CENTER ONLY)
Abs Immature Granulocytes: 0.01 10*3/uL (ref 0.00–0.07)
Basophils Absolute: 0 10*3/uL (ref 0.0–0.1)
Basophils Relative: 1 %
Eosinophils Absolute: 0.1 10*3/uL (ref 0.0–0.5)
Eosinophils Relative: 2 %
HCT: 38.6 % — ABNORMAL LOW (ref 39.0–52.0)
Hemoglobin: 13.2 g/dL (ref 13.0–17.0)
Immature Granulocytes: 0 %
Lymphocytes Relative: 25 %
Lymphs Abs: 1.6 10*3/uL (ref 0.7–4.0)
MCH: 30.3 pg (ref 26.0–34.0)
MCHC: 34.2 g/dL (ref 30.0–36.0)
MCV: 88.5 fL (ref 80.0–100.0)
Monocytes Absolute: 0.7 10*3/uL (ref 0.1–1.0)
Monocytes Relative: 11 %
Neutro Abs: 3.9 10*3/uL (ref 1.7–7.7)
Neutrophils Relative %: 61 %
Platelet Count: 192 10*3/uL (ref 150–400)
RBC: 4.36 MIL/uL (ref 4.22–5.81)
RDW: 13.3 % (ref 11.5–15.5)
WBC Count: 6.3 10*3/uL (ref 4.0–10.5)
nRBC: 0 % (ref 0.0–0.2)

## 2023-01-07 LAB — CMP (CANCER CENTER ONLY)
ALT: 26 U/L (ref 0–44)
AST: 24 U/L (ref 15–41)
Albumin: 4.3 g/dL (ref 3.5–5.0)
Alkaline Phosphatase: 76 U/L (ref 38–126)
Anion gap: 7 (ref 5–15)
BUN: 14 mg/dL (ref 8–23)
CO2: 25 mmol/L (ref 22–32)
Calcium: 9.2 mg/dL (ref 8.9–10.3)
Chloride: 105 mmol/L (ref 98–111)
Creatinine: 0.88 mg/dL (ref 0.61–1.24)
GFR, Estimated: >60 mL/min (ref >60)
Glucose, Bld: 114 mg/dL — ABNORMAL HIGH (ref 70–99)
Potassium: 4.3 mmol/L (ref 3.5–5.1)
Sodium: 137 mmol/L (ref 135–145)
Total Bilirubin: 0.5 mg/dL (ref 0.3–1.2)
Total Protein: 6.7 g/dL (ref 6.5–8.1)

## 2023-01-07 LAB — LACTATE DEHYDROGENASE: LDH: 136 U/L (ref 98–192)

## 2023-01-07 NOTE — Progress Notes (Signed)
HEMATOLOGY/ONCOLOGY CLINIC NOTE  Date of Service: 01/07/23  Patient Care Team: Crist Fat, MD as PCP - General (Internal Medicine) Jene Every, MD as Consulting Physician (Orthopedic Surgery)  CHIEF COMPLAINTS/PURPOSE OF CONSULTATION:  F/u for Burkitt's lymphoma  DIAGNOSIS  Burkitts Lymphoma currently in remission  Stage IVB Burkitt's lymphoma with t(8;14) translocation diagnosed 12/23/2015. This appears to be involving the sacrum causing sacral and root involvement with possible neurogenic bladder. PET CT scan as noted above shows extensive involvement with lymphoma. CSF negative for involvement. Bone marrow biopsy negative for involvement by lymphoma.  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 PET CT scan as noted above shows extensive involvement with lymphoma. CSF negative for involvement. Bone marrow biopsy negative for involvement by lymphoma. PET/CT scan after 3 cycles of EPOCH-R  on 03/02/2016  showed significant interval metabolic response.  Patient has completed cycle 6 of EPOCH-R and tolerated it well with no prohibitive toxicities. Grade 1 fatigue.  He has had some episodes of fevers including a periodontal abscess,  non-neutropenic fevers - unclear etiology, recent Escherichia coli urinary tract infection x 2 and Clostridium difficile diarrhea.  PET CT scan done after completion of this planned chemotherapy shows no clear evidence of residual lymphoma . Some borderline FDG avidity in the left sacral ala ?pathologic fracture vs residual lymphoma  Patient was evaluated by radiation oncology - no RT recommended to sacral lesion.  CURRENT TREATMENT -Active surveillance  Previous treatment The patient has completed 6 cycles of EPOCH -R and IT methotrexate x 4 doses starting with cycle 3 for CNS prophylaxis   HISTORY OF PRESENTING  ILLNESS:  Please see my previous note for details  INTERVAL HISTORY:  Erik Vaughn is a 72 y.o. male here for a follow-up of his Burkitts lymphoma. The patient's last visit with [place] was on 01/05/2022 and was doing well overall with no new medical concerns.   Today, he reports that he has been feeling well overall since his last clinical visit. Patient reports normal eating and sleeping habits and denies any lumps/bumps, fevers, chills, night sweats, testicular pain/swelling or major medication changes.   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   He smoked about 2 packs a day for 40 years.  SURGICAL HISTORY:  #1 left submandibular salivary gland benign tumor excision #2 port placement - 12/31/2015  SOCIAL HISTORY: Social History   Socioeconomic History   Marital status: Married    Spouse name: Not on file   Number of children: Not on file   Years of education: Not on file   Highest education level: Not on file  Occupational History   Occupation: meter specialist  Tobacco Use   Smoking status: Former    Current packs/day: 0.00    Average packs/day: 2.0 packs/day for 16.0 years (32.0 ttl pk-yrs)    Types: Cigarettes    Start date: 12/22/1989    Quit date: 12/22/2005    Years since quitting: 17.0   Smokeless tobacco: Current    Types: Chew  Vaping Use   Vaping status: Never Used  Substance and Sexual Activity   Alcohol use: No   Drug use: No   Sexual activity: Never  Other Topics Concern   Not on file  Social History  Narrative   Not on file   Social Determinants of Health   Financial Resource Strain: Not on file  Food Insecurity: Not on file  Transportation Needs: Not on file  Physical Activity: Not on file  Stress: Not on file  Social Connections: Not on file  Intimate Partner  Violence: Not on file   smoked 2 packs of cigarettes per day for 40 years quit about 10 years ago. Denies significant alcohol use. 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  FAMILY HISTORY:  Sister had breast cancer at age 26 years. Unknown if she had genetic testing One maternal uncle had melanoma Second maternal uncle had pancreatic cancer under the age of 85yr Paternal uncle with prostate cancer Dad had prostate cancer at age 72 years   ALLERGIES:  has No Known Allergies.  MEDICATIONS:  Current Outpatient Medications  Medication Sig Dispense Refill   amLODipine (NORVASC) 10 MG tablet TAKE 1 TABLET BY MOUTH EVERY EVENING. 90 tablet 2   b complex vitamins tablet Take 1 tablet by mouth daily. Unknown strenght     clopidogrel (PLAVIX) 75 MG tablet TAKE 1 TABLET BY MOUTH EVERY EVENING. 90 tablet 2   lisinopril (ZESTRIL) 20 MG tablet TAKE 1 TABLET BY MOUTH EVERY EVENING. 90 tablet 2   rosuvastatin (CRESTOR) 10 MG tablet TAKE 1 TABLET BY MOUTH DAILY. 90 tablet 2   tamsulosin (FLOMAX) 0.4 MG CAPS capsule Take 0.4 mg by mouth daily.     traZODone (DESYREL) 100 MG tablet TAKE 2 TABLETS BY MOUTH AT BEDTIME. 60 tablet 0   No current facility-administered medications for this visit.    REVIEW OF SYSTEMS:    10 Point review of Systems was done is negative except as noted above.   PHYSICAL EXAMINATION: ECOG PERFORMANCE STATUS: 1 - Symptomatic but completely ambulatory   There were no vitals filed for this visit.  There is no height or weight on file to calculate BMI.   GENERAL:alert, in no acute distress and comfortable SKIN: no acute rashes, no significant lesions EYES: conjunctiva are pink and non-injected, sclera anicteric OROPHARYNX: MMM, no exudates, no oropharyngeal erythema or ulceration NECK: supple, no  JVD LYMPH:  no palpable lymphadenopathy in the cervical, axillary or inguinal regions LUNGS: clear to auscultation b/l with normal respiratory effort HEART: regular rate & rhythm ABDOMEN:  normoactive bowel sounds , non tender, not distended. Extremity: no pedal edema PSYCH: alert & oriented x 3 with fluent speech NEURO: no focal motor/sensory deficits   LABORATORY DATA:  I have reviewed the data as listed .    Latest Ref Rng & Units 03/05/2022   12:00 AM 01/05/2022    1:31 PM 01/02/2021   10:34 AM  CBC  WBC 3.4 - 10.8 x10E3/uL 6.1  7.0  6.1   Hemoglobin 13.0 - 17.7 g/dL 60.4  54.0  98.1   Hematocrit 37.5 - 51.0 % 41.5  40.1  37.6   Platelets 150 - 450 x10E3/uL 211  194  179    HGB 13.6  CBC    Component Value Date/Time   WBC 6.1 03/05/2022 0000   WBC 7.0 01/05/2022 1331   WBC 6.1 01/02/2021 1034   RBC 4.63 03/05/2022 0000   RBC 4.63 01/05/2022 1331   HGB 14.2 03/05/2022 0000   HGB 13.0 05/20/2017 1012   HCT  41.5 03/05/2022 0000   HCT 39.4 05/20/2017 1012   PLT 211 03/05/2022 0000   MCV 90 03/05/2022 0000   MCV 86.4 05/20/2017 1012   MCH 30.7 03/05/2022 0000   MCH 30.2 01/05/2022 1331   MCHC 34.2 03/05/2022 0000   MCHC 34.9 01/05/2022 1331   RDW 12.8 03/05/2022 0000   RDW 13.7 05/20/2017 1012   LYMPHSABS 1.6 03/05/2022 0000   LYMPHSABS 1.4 05/20/2017 1012   MONOABS 0.7 01/05/2022 1331   MONOABS 0.6 05/20/2017 1012   EOSABS 0.1 03/05/2022 0000   BASOSABS 0.1 03/05/2022 0000   BASOSABS 0.0 05/20/2017 1012    ANC 100     Latest Ref Rng & Units 11/08/2022    9:33 AM 03/05/2022   12:00 AM 01/05/2022    1:31 PM  CMP  Glucose 70 - 99 mg/dL 841  324  99   BUN 8 - 27 mg/dL 13  9  14    Creatinine 0.76 - 1.27 mg/dL 4.01  0.27  2.53   Sodium 134 - 144 mmol/L 141  139  137   Potassium 3.5 - 5.2 mmol/L 5.0  4.7  4.1   Chloride 96 - 106 mmol/L 102  101  104   CO2 20 - 29 mmol/L 24  26  27    Calcium 8.6 - 10.2 mg/dL 9.4  9.7  9.6   Total Protein 6.0 - 8.5 g/dL 6.7  6.8   7.3   Total Bilirubin 0.0 - 1.2 mg/dL 0.5  0.8  0.7   Alkaline Phos 44 - 121 IU/L 103  96  81   AST 0 - 40 IU/L 25  29  25    ALT 0 - 44 IU/L 25  30  27    . Lab Results  Component Value Date   LDH 138 01/05/2022    RADIOGRAPHIC STUDIES: I have personally reviewed the radiological images as listed and agreed with the findings in the report.  .No results found.  ASSESSMENT & PLAN:   72 y.o. 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  Patient was evaluated by radiation oncology and no ISRT was recommended. His LDH level appears to have been increased and he had a repeat PET/CT scan on 10/29/2015 that shows no overt evidence of lymphoma recurrence.  #2 s/p neutropenia post Rituxan delayed neutropenia- remains resolved.  #3 s/p  left sacral ala and S1 fracture. L5-S1 and S1-S2 left-sided nerve root compression -resolved  #4 history of peripheral arterial disease  #5 chemotherapy related neuropathy - nearly resolved.  #6 Hypertension, dyslipidemia, peripheral arterial disease -Continue management as per primary care physician.  #7 Ex-smoker with 80-pack-year history of smoking.  PLAN:  -discussed lab results on 01/07/2023 in detail with patient. CBC normal showed WBC of 6.3K, hemoglobin of 13.2, and platelets of 192K. -CMP normal -LDH normal -Recommend patient to stay UTD with age-appropriate vaccinations including COVID-19, influenza, and RSV -The pt shows no lab or clinical evidence of Burkitts lymphoma recurrence/progression at this time.  -maintain active lifestyle -will continue to monitor with labs in 1 year  FOLLOW-UP: RTC with Dr Candise Che with labs in 12 months  The total time spent in the appointment was *** minutes* .  All of the patient's questions were answered with apparent satisfaction. The patient knows  to call the clinic with any problems, questions or concerns.  Wyvonnia Lora MD MS AAHIVMS [facility] Hematology/Oncology Physician [facility]  .*Total Encounter Time as defined by the Centers for Medicare and Medicaid Services includes, in addition to the face-to-face time of a patient visit (documented in the note above) non-face-to-face time: obtaining and reviewing outside history, ordering and reviewing medications, tests or procedures, care coordination (communications with other health care professionals or caregivers) and documentation in the medical record.    I,Mitra Faeizi,acting as a Neurosurgeon for Wyvonnia Lora, MD.,have documented all relevant documentation on the behalf of Wyvonnia Lora, MD,as directed by  Wyvonnia Lora, MD while in the presence of Wyvonnia Lora, MD.  ***

## 2023-02-02 ENCOUNTER — Other Ambulatory Visit: Payer: Self-pay | Admitting: Internal Medicine

## 2023-03-04 ENCOUNTER — Other Ambulatory Visit: Payer: Self-pay | Admitting: Internal Medicine

## 2023-03-09 ENCOUNTER — Encounter: Payer: Self-pay | Admitting: Internal Medicine

## 2023-03-09 ENCOUNTER — Ambulatory Visit: Payer: PPO | Admitting: Internal Medicine

## 2023-03-09 VITALS — BP 120/78 | HR 77 | Temp 98.5°F | Resp 18 | Ht 73.0 in | Wt 205.0 lb

## 2023-03-09 DIAGNOSIS — N400 Enlarged prostate without lower urinary tract symptoms: Secondary | ICD-10-CM

## 2023-03-09 DIAGNOSIS — I6529 Occlusion and stenosis of unspecified carotid artery: Secondary | ICD-10-CM | POA: Diagnosis not present

## 2023-03-09 DIAGNOSIS — E7800 Pure hypercholesterolemia, unspecified: Secondary | ICD-10-CM

## 2023-03-09 DIAGNOSIS — Z6827 Body mass index (BMI) 27.0-27.9, adult: Secondary | ICD-10-CM | POA: Diagnosis not present

## 2023-03-09 DIAGNOSIS — G4700 Insomnia, unspecified: Secondary | ICD-10-CM | POA: Diagnosis not present

## 2023-03-09 DIAGNOSIS — Z0000 Encounter for general adult medical examination without abnormal findings: Secondary | ICD-10-CM

## 2023-03-09 DIAGNOSIS — I10 Essential (primary) hypertension: Secondary | ICD-10-CM

## 2023-03-09 DIAGNOSIS — F411 Generalized anxiety disorder: Secondary | ICD-10-CM | POA: Diagnosis not present

## 2023-03-09 DIAGNOSIS — I739 Peripheral vascular disease, unspecified: Secondary | ICD-10-CM | POA: Diagnosis not present

## 2023-03-09 DIAGNOSIS — Z8572 Personal history of non-Hodgkin lymphomas: Secondary | ICD-10-CM | POA: Diagnosis not present

## 2023-03-09 MED ORDER — TRAZODONE HCL 100 MG PO TABS: 200.0000 mg | ORAL_TABLET | Freq: Every day | ORAL | 3 refills | Status: DC

## 2023-03-09 NOTE — Assessment & Plan Note (Signed)
This is currently stable at this time.

## 2023-03-09 NOTE — Assessment & Plan Note (Signed)
Cardiology orders surveillance carotid doppler US.  Continue on plavix and his current cholesterol meds.

## 2023-03-09 NOTE — Assessment & Plan Note (Signed)
He will remain on trazodone which does help with his insomnia.

## 2023-03-09 NOTE — Assessment & Plan Note (Signed)
He is not having any urinary symptoms.  His PSA is followed by DR. Chao.

## 2023-03-09 NOTE — Assessment & Plan Note (Signed)
We will continue control of his choelsterol and his BP.  He is followed also by cardiology.

## 2023-03-09 NOTE — Assessment & Plan Note (Signed)
We will check his FLP today.

## 2023-03-09 NOTE — Assessment & Plan Note (Signed)
I want to eat healthy and exercise and keep active.

## 2023-03-09 NOTE — Progress Notes (Signed)
Preventive Screening-Counseling & Management    Erik Vaughn is a 72 year old Caucasian/White male who presents for his annual wellness exam. He is due for the following health maintenance studies: colonoscopy, and screening labs. This patient's past medical history Benign Prostatic Hypertrophy, Burkett Lymphoma, Carotid Stenosis, Hypercholesterolemia, Hypertension, Neuropathy, and Peripheral Vascular Disease.   His last eye exam 12/2021 and he states his vision is doing well and he got new glasses. There is no family history of colorectal cancer. He states his last colonoscopy was about 15 years ago by Dr. Chales Abrahams. I do not have this report but he states that there was polyps. He has not had a repeat colonoscopy but he states that his prior PCP was doing stool guiac cards which have been negative.  He told me in 72 he did not want anymore colonoscopies so we did a FIT test on him in 02/2022 and this was negative.  He states his father did have prostate cancer. He does see Dr. Saddie Benders for BPH where he is on Flomax. He denies any problems with urination. The patient does exercise by walking.  The patient has a history of tobacco abuse where he quit in 2014 where he smoked 1.5 ppd for 50 years.  He does get yearly flu vaccines. He states he did have his Prevnar 13 and pneumovax 23 vaccines in the past. He did get a shingrix vaccine in 2022.  He has refused RSV vaccine in the past.  He has had a total of 3 COVID-19 vaccines including 1 booster. There is no family history of CAD or strokes. He is not on an ASA but is on Plavix 75mg  daily.  The patient is a 72 year old Caucasian/White male who presents for a follow-up evaluation of hypertension.  Since his last visit, he has not had any problems.   He was diagnosed with HTN around 2015. The patient has not been checking his blood pressure at home. The patient's current medications include: amlodipine 10mg  daily and lisinopril 20mg  daily. The patient has been  tolerating his medications well. The patient denies any headache, visual changes, dizziness, lightheadness, chest pain, shortness of breath, weakness/numbness, and edema.    The patient also has a history of HCL where he has peripheral arterial disease with carotid stenosis.  He is currently followed by Dr. Kirtland Bouchard who did a FLP on him in 01/2022 and his cholesterol was controlled.  He did have a repeat carotid US on 03/08/2022 and this showed a right ICA consistent with a 1-39% stenosis and a left ICA consistent with a 1-39% stenosis.   He remains on risk factor modification and is on Plavix daily. They also did ABI's on his legs from what he is telling me and he has some PAD in his legs as well.  Overall, he states he is doing well and is without any complaints or problems at this time. He specifically denies abdominal pain, nausea, vomiting, diarrhea, myalgias, and fatigue. He remains on dietary management as well as the following cholesterol lowering medications rosuvastatin 10 mg tablet. He is fasting in anticipation for labs today.   The patient returns today for followup of his anxiety and insomnia. He had some anxiousness when he started chemotherapy for his Burkett's Lymphoma in 2017. He was having stressors with his wife having some health problems and he states he was concerned about his BPH with Dr. Saddie Benders. His biggest complaint last year was his insomnia where I increased his trazodone from  150mg  to 200mg  nightly.  Over the interim, he states this is helping with his sleep.  He is now sleeping 7-8 hours at night.  He denies any worsening anxiety or depression.  He denies any side effects from the trazodone.  He was having problems with his mind racing at night when he would lie down but this has improved greatly.  He was having previous problems where he would take a few hours to go to sleep and once he is asleep, he will wake up frequently.  Now he is able to go right to sleep as sleep as described above.   The patient denies any snoring.   He was also diagnosed with Burkett's Lymphoma in 2017.  He had Stage IVB Burkitt's lymphoma diagnosed in 11/2015.  This appeared to be involving the sacrum causing sacral and root involvement with possible neurogenic bladder.  He tells me he had a sacral fracture (not associated with chemo) that he had a neuropathy of his left leg with some numbness and no pain.  The patient completed 6 cycles of EPOCH-R with IT methotrexate and tolerated this well.  He had repeat PET scanning done after his chemotherapy which showed no evidence of residual lymphoma.  He has been remission since 03/2016.  He is having active surveillance yearly by Oncology with last visit in 12/2022.        Are there smokers in your home (other than you)? No  Risk Factors Current exercise habits:  as above   Dietary issues discussed: none   Depression Screen (Note: if answer to either of the following is "Yes", a more complete depression screening is indicated)   Over the past two weeks, have you felt down, depressed or hopeless? No  Over the past two weeks, have you felt little interest or pleasure in doing things? No  Have you lost interest or pleasure in daily life? No  Do you often feel hopeless? No  Do you cry easily over simple problems? No  Activities of Daily Living In your present state of health, do you have any difficulty performing the following activities?:  Driving? No Managing money?  No Feeding yourself? No Getting from bed to chair? No Climbing a flight of stairs? No Preparing food and eating?: No Bathing or showering? No Getting dressed: No Getting to the toilet? No Using the toilet:No Moving around from place to place: No In the past year have you fallen or had a near fall?:No   Are you sexually active?  No  Do you have more than one partner?  No  Hearing Difficulties: yes- wears hearing aids Do you often ask people to speak up or repeat themselves? No Do  you experience ringing or noises in your ears? No Do you have difficulty understanding soft or whispered voices? No   Do you feel that you have a problem with memory? No  Do you often misplace items? No  Do you feel safe at home?  Yes  Cognitive Testing  Alert? Yes  Normal Appearance?Yes  Oriented to person? Yes  Place? Yes   Time? Yes  Recall of three objects?  Yes  Can perform simple calculations? Yes  Displays appropriate judgment?Yes  Can read the correct time from a watch face?Yes  Fall Risk Prevention  Any stairs in or around the home? No  If so, are there any without handrails? No  Home free of loose throw rugs in walkways, pet beds, electrical cords, etc? Yes  Adequate lighting in  your home to reduce risk of falls? Yes  Use of a cane, walker or w/c? No    Time Up and Go  Was the test performed? Yes .  Length of time to ambulate 10 feet: 9 sec.   Gait steady and fast without use of assistive device    Advanced Directives have been discussed with the patient? Yes   List the Names of Other Physician/Practitioners you currently use: Patient Care Team: Crist Fat, MD as PCP - General (Internal Medicine) Jene Every, MD as Consulting Physician (Orthopedic Surgery)    Past Medical History:  Diagnosis Date   BPH (benign prostatic hyperplasia)    Burkitt lymphoma of lymph nodes of multiple sites (HCC) 03/08/2016   Carotid stenosis    Claudication (HCC) 04/28/2015   Dyslipidemia 04/28/2015   Encounter for antineoplastic chemotherapy    Essential hypertension 04/28/2015   Murmur 02/07/2016   Neoplasm related pain    PAD (peripheral artery disease) (HCC) left leg    Past Surgical History:  Procedure Laterality Date   IR GENERIC HISTORICAL  12/31/2015   IR FLUORO GUIDE CV LINE RIGHT 12/31/2015 Oley Balm, MD [facility]   IR GENERIC HISTORICAL  12/31/2015   IR US GUIDE VASC ACCESS RIGHT 12/31/2015 Oley Balm, MD [facility]   IR GENERIC HISTORICAL   07/20/2016   IR REMOVAL TUN ACCESS W/ PORT W/O FL MOD SED 07/20/2016 Malachy Moan, MD [facility]   left salivary Left    removed      Current Medications  Current Outpatient Medications  Medication Sig Dispense Refill   amLODipine (NORVASC) 10 MG tablet TAKE 1 TABLET BY MOUTH EVERY EVENING. 90 tablet 2   b complex vitamins tablet Take 1 tablet by mouth daily. Unknown strenght     clopidogrel (PLAVIX) 75 MG tablet TAKE 1 TABLET BY MOUTH EVERY EVENING. 90 tablet 2   lisinopril (ZESTRIL) 20 MG tablet TAKE 1 TABLET BY MOUTH EVERY EVENING. 90 tablet 2   rosuvastatin (CRESTOR) 10 MG tablet TAKE 1 TABLET BY MOUTH DAILY. 90 tablet 2   tamsulosin (FLOMAX) 0.4 MG CAPS capsule Take 0.4 mg by mouth daily.     traZODone (DESYREL) 100 MG tablet TAKE 2 TABLETS BY MOUTH AT BEDTIME. 60 tablet 0   No current facility-administered medications for this visit.    Allergies Patient has no known allergies.   Social History Social History   Tobacco Use   Smoking status: Former    Current packs/day: 0.00    Average packs/day: 2.0 packs/day for 16.0 years (32.0 ttl pk-yrs)    Types: Cigarettes    Start date: 12/22/1989    Quit date: 12/22/2005    Years since quitting: 17.2   Smokeless tobacco: Current    Types: Chew  Substance Use Topics   Alcohol use: No     Review of Systems Review of Systems  Constitutional:  Negative for chills, fever, malaise/fatigue and weight loss.  Eyes:  Negative for blurred vision and double vision.  Respiratory:  Negative for cough, hemoptysis, shortness of breath and wheezing.   Cardiovascular:  Negative for chest pain, palpitations and leg swelling.  Gastrointestinal:  Negative for abdominal pain, blood in stool, constipation, diarrhea, heartburn, melena, nausea and vomiting.  Genitourinary:  Negative for frequency and hematuria.  Musculoskeletal:  Negative for myalgias.  Skin:  Negative for itching.  Neurological:  Negative for dizziness, weakness and  headaches.  Endo/Heme/Allergies:  Negative for polydipsia.  Psychiatric/Behavioral:  Negative for depression. The patient is  not nervous/anxious and does not have insomnia.      Physical Exam:      Body mass index is 27.05 kg/m. BP 120/78   Pulse 77   Temp 98.5 F (36.9 C)   Resp 18   Ht 6\' 1"  (1.854 m)   Wt 205 lb (93 kg)   SpO2 97%   BMI 27.05 kg/m   Physical Exam Constitutional:      Appearance: Normal appearance. He is not ill-appearing.  HENT:     Head: Normocephalic and atraumatic.     Right Ear: Tympanic membrane, ear canal and external ear normal.     Left Ear: Tympanic membrane, ear canal and external ear normal.     Nose: Nose normal. No congestion or rhinorrhea.     Mouth/Throat:     Mouth: Mucous membranes are dry.     Pharynx: Oropharynx is clear. No oropharyngeal exudate or posterior oropharyngeal erythema.  Eyes:     General: No scleral icterus.    Conjunctiva/sclera: Conjunctivae normal.     Pupils: Pupils are equal, round, and reactive to light.  Neck:     Vascular: No carotid bruit.  Cardiovascular:     Rate and Rhythm: Normal rate and regular rhythm.     Pulses: Normal pulses.     Heart sounds: No murmur heard.    No friction rub. No gallop.  Pulmonary:     Effort: Pulmonary effort is normal. No respiratory distress.     Breath sounds: No wheezing, rhonchi or rales.  Abdominal:     General: Abdomen is flat. Bowel sounds are normal. There is no distension.     Palpations: Abdomen is soft.     Tenderness: There is no abdominal tenderness.  Musculoskeletal:     Cervical back: Neck supple. No tenderness.     Right lower leg: No edema.     Left lower leg: No edema.  Lymphadenopathy:     Cervical: No cervical adenopathy.  Skin:    General: Skin is warm and dry.     Findings: No rash.  Neurological:     General: No focal deficit present.     Mental Status: He is alert and oriented to person, place, and time.  Psychiatric:        Mood and  Affect: Mood normal.        Behavior: Behavior normal.      Assessment:      Essential hypertension  PVD (peripheral vascular disease) (HCC)  Stenosis of carotid artery, unspecified laterality  Benign prostatic hyperplasia without lower urinary tract symptoms  Insomnia, unspecified type  GAD (generalized anxiety disorder)  Hypercholesterolemia  History of Burkitt's lymphoma  BMI 27.0-27.9,adult    Plan:     During the course of the visit the patient was educated and counseled about appropriate screening and preventive services including:   Pneumococcal vaccine  Influenza vaccine Colorectal cancer screening  Diet review for nutrition referral? Yes ____  Not Indicated _X___   Patient Instructions (the written plan) was given to the patient.  Essential hypertension His BP is controlled. We will continue on his current medications.  PVD (peripheral vascular disease) (HCC) We will continue control of his choelsterol and his BP.  He is followed also by cardiology.  Stenosis of carotid artery Cardiology orders surveillance carotid doppler US.  Continue on plavix and his current cholesterol meds.  Benign prostatic hyperplasia without lower urinary tract symptoms He is not having any urinary symptoms.  His PSA is  followed by DR. Chao.  BMI 27.0-27.9,adult I want to eat healthy and exercise and keep active.  GAD (generalized anxiety disorder) This is currently stable at this time.  History of Burkitt's lymphoma He is followed yearly by oncology and remains on remission.  Hypercholesterolemia We will check his FLP today.  Insomnia He will remain on trazodone which does help with his insomnia.   Prevention Health maintenance discussed.  I had a discussion regarding advanced directives.  We gave him a FIT test to do.  We will obtain some yearly labs.  Medicare Attestation I have personally reviewed: The patient's medical and social history Their use of  alcohol, tobacco or illicit drugs Their current medications and supplements The patient's functional ability including ADLs,fall risks, home safety risks, cognitive, and hearing and visual impairment Diet and physical activities Evidence for depression or mood disorders  The patient's weight, height, and BMI have been recorded in the chart.  I have made referrals, counseling, and provided education to the patient based on review of the above and I have provided the patient with a written personalized care plan for preventive services.     Crist Fat, MD   03/09/2023

## 2023-03-09 NOTE — Assessment & Plan Note (Signed)
His BP is controlled.  We will continue on his current medications. 

## 2023-03-09 NOTE — Assessment & Plan Note (Signed)
He is followed yearly by oncology and remains on remission.

## 2023-03-10 LAB — CBC WITH DIFFERENTIAL/PLATELET
Basophils Absolute: 0 10*3/uL (ref 0.0–0.2)
Basos: 1 %
EOS (ABSOLUTE): 0.2 10*3/uL (ref 0.0–0.4)
Eos: 3 %
Hematocrit: 41.4 % (ref 37.5–51.0)
Hemoglobin: 13.3 g/dL (ref 13.0–17.7)
Immature Grans (Abs): 0 10*3/uL (ref 0.0–0.1)
Immature Granulocytes: 0 %
Lymphocytes Absolute: 1.6 10*3/uL (ref 0.7–3.1)
Lymphs: 27 %
MCH: 29.8 pg (ref 26.6–33.0)
MCHC: 32.1 g/dL (ref 31.5–35.7)
MCV: 93 fL (ref 79–97)
Monocytes Absolute: 0.6 10*3/uL (ref 0.1–0.9)
Monocytes: 10 %
Neutrophils Absolute: 3.4 10*3/uL (ref 1.4–7.0)
Neutrophils: 59 %
Platelets: 228 10*3/uL (ref 150–450)
RBC: 4.47 x10E6/uL (ref 4.14–5.80)
RDW: 13 % (ref 11.6–15.4)
WBC: 5.8 10*3/uL (ref 3.4–10.8)

## 2023-03-10 LAB — CMP14 + ANION GAP
ALT: 24 [IU]/L (ref 0–44)
AST: 27 [IU]/L (ref 0–40)
Albumin: 4.8 g/dL (ref 3.8–4.8)
Alkaline Phosphatase: 89 [IU]/L (ref 44–121)
Anion Gap: 13 mmol/L (ref 10.0–18.0)
BUN/Creatinine Ratio: 13 (ref 10–24)
BUN: 13 mg/dL (ref 8–27)
Bilirubin Total: 0.5 mg/dL (ref 0.0–1.2)
CO2: 26 mmol/L (ref 20–29)
Calcium: 10.2 mg/dL (ref 8.6–10.2)
Chloride: 100 mmol/L (ref 96–106)
Creatinine, Ser: 1 mg/dL (ref 0.76–1.27)
Globulin, Total: 2 g/dL (ref 1.5–4.5)
Glucose: 104 mg/dL — ABNORMAL HIGH (ref 70–99)
Potassium: 5.2 mmol/L (ref 3.5–5.2)
Sodium: 139 mmol/L (ref 134–144)
Total Protein: 6.8 g/dL (ref 6.0–8.5)
eGFR: 80 mL/min/{1.73_m2} (ref >59)

## 2023-03-10 LAB — LIPID PANEL
Chol/HDL Ratio: 2.4 {ratio} (ref 0.0–5.0)
Cholesterol, Total: 97 mg/dL — ABNORMAL LOW (ref 100–199)
HDL: 40 mg/dL (ref >39)
LDL Chol Calc (NIH): 42 mg/dL (ref 0–99)
Triglycerides: 73 mg/dL (ref 0–149)
VLDL Cholesterol Cal: 15 mg/dL (ref 5–40)

## 2023-03-10 LAB — HEMOGLOBIN A1C
Est. average glucose Bld gHb Est-mCnc: 126 mg/dL
Hgb A1c MFr Bld: 6 % — ABNORMAL HIGH (ref 4.8–5.6)

## 2023-03-10 LAB — TSH: TSH: 1.76 u[IU]/mL (ref 0.450–4.500)

## 2023-03-14 ENCOUNTER — Other Ambulatory Visit: Payer: Self-pay | Admitting: Internal Medicine

## 2023-03-14 ENCOUNTER — Ambulatory Visit: Payer: PPO | Admitting: Internal Medicine

## 2023-03-14 DIAGNOSIS — N400 Enlarged prostate without lower urinary tract symptoms: Secondary | ICD-10-CM | POA: Diagnosis not present

## 2023-03-14 NOTE — Progress Notes (Unsigned)
Nurse visit  Fit test

## 2023-03-15 LAB — FECAL OCCULT BLOOD, IMMUNOCHEMICAL: Fecal Occult Bld: NEGATIVE

## 2023-03-22 NOTE — Progress Notes (Signed)
Tell him that his labs look good but he has dveloped prediabetes. I want him to cut sugars in diet and eat healthy and exercise.  Patient is aware of labs

## 2023-03-22 NOTE — Progress Notes (Signed)
His stool guiac was negative.   Patient aware of labs

## 2023-04-01 DIAGNOSIS — R972 Elevated prostate specific antigen [PSA]: Secondary | ICD-10-CM | POA: Diagnosis not present

## 2023-04-01 DIAGNOSIS — N401 Enlarged prostate with lower urinary tract symptoms: Secondary | ICD-10-CM | POA: Diagnosis not present

## 2023-04-20 ENCOUNTER — Ambulatory Visit: Payer: PPO | Attending: Cardiology

## 2023-04-20 DIAGNOSIS — R0989 Other specified symptoms and signs involving the circulatory and respiratory systems: Secondary | ICD-10-CM

## 2023-04-25 ENCOUNTER — Telehealth: Payer: Self-pay

## 2023-04-25 NOTE — Telephone Encounter (Signed)
Patient notified through my chart.

## 2023-04-25 NOTE — Telephone Encounter (Signed)
-----   Message from Gypsy Balsam sent at 04/25/2023 10:10 AM EST ----- Carotic arterial disease show up to 59% stenosis on the left.  Medical therapy

## 2023-05-16 ENCOUNTER — Encounter: Payer: Self-pay | Admitting: Cardiology

## 2023-05-16 ENCOUNTER — Ambulatory Visit: Payer: PPO | Attending: Cardiology | Admitting: Cardiology

## 2023-05-16 VITALS — BP 138/70 | HR 71 | Ht 73.0 in | Wt 206.0 lb

## 2023-05-16 DIAGNOSIS — I10 Essential (primary) hypertension: Secondary | ICD-10-CM | POA: Diagnosis not present

## 2023-05-16 DIAGNOSIS — I739 Peripheral vascular disease, unspecified: Secondary | ICD-10-CM

## 2023-05-16 DIAGNOSIS — I350 Nonrheumatic aortic (valve) stenosis: Secondary | ICD-10-CM

## 2023-05-16 DIAGNOSIS — I6523 Occlusion and stenosis of bilateral carotid arteries: Secondary | ICD-10-CM

## 2023-05-16 NOTE — Addendum Note (Signed)
Addended by: Baldo Ash D on: 05/16/2023 10:13 AM   Modules accepted: Orders

## 2023-05-16 NOTE — Patient Instructions (Addendum)
Medication Instructions:  Your physician recommends that you continue on your current medications as directed. Please refer to the Current Medication list given to you today.  *If you need a refill on your cardiac medications before your next appointment, please call your pharmacy*   Lab Work: None Ordered If you have labs (blood work) drawn today and your tests are completely normal, you will receive your results only by: MyChart Message (if you have MyChart) OR A paper copy in the mail If you have any lab test that is abnormal or we need to change your treatment, we will call you to review the results.   Testing/Procedures: Your physician has requested that you have a carotid duplex. This test is an ultrasound of the carotid arteries in your neck. It looks at blood flow through these arteries that supply the brain with blood. Allow one hour for this exam. There are no restrictions or special instructions.    Follow-Up: At [facility], you and your health needs are our priority.  As part of our continuing mission to provide you with exceptional heart care, we have created designated Provider Care Teams.  These Care Teams include your primary Cardiologist (physician) and Advanced Practice Providers (APPs -  Physician Assistants and Nurse Practitioners) who all work together to provide you with the care you need, when you need it.  We recommend signing up for the patient portal called "MyChart".  Sign up information is provided on this After Visit Summary.  MyChart is used to connect with patients for Virtual Visits (Telemedicine).  Patients are able to view lab/test results, encounter notes, upcoming appointments, etc.  Non-urgent messages can be sent to your provider as well.   To learn more about what you can do with MyChart, go to [email or web address].    Your next appointment:   6 month(s)  The format for your next appointment:   In Person  Provider:   Robert Krasowski, MD     Other Instructions NA  

## 2023-05-16 NOTE — Progress Notes (Signed)
Cardiology Office Note:    Date:  05/16/2023   ID:  Erik Vaughn, DOB [birth year], MRN [id number]  PCP:  Crist Fat, MD  Cardiologist:  Gypsy Balsam, MD    Referring MD: Leonia Reader, Barbara Cower, MD   Chief Complaint  Patient presents with   Follow-up    History of Present Illness:    Erik Vaughn is a 72 y.o. male past medical history significant for mild aortic stenosis, essential hypertension, peripheral vascular disease in form of carotic arterial stenosis up to 39% stenosis on the right and 59% stenosis on the left, dyslipidemia.  Comes today to months for follow-up overall doing fine.  Denies of any chest pain tightness squeezing pressure burning chest.  Past Medical History:  Diagnosis Date   BPH (benign prostatic hyperplasia)    Burkitt lymphoma of lymph nodes of multiple sites (HCC) 03/08/2016   Carotid stenosis    Claudication (HCC) 04/28/2015   Dyslipidemia 04/28/2015   Encounter for antineoplastic chemotherapy    Essential hypertension 04/28/2015   Murmur 02/07/2016   Neoplasm related pain    PAD (peripheral artery disease) (HCC) left leg    Past Surgical History:  Procedure Laterality Date   IR GENERIC HISTORICAL  12/31/2015   IR FLUORO GUIDE CV LINE RIGHT 12/31/2015 Oley Balm, MD [facility]   IR GENERIC HISTORICAL  12/31/2015   IR US GUIDE VASC ACCESS RIGHT 12/31/2015 Oley Balm, MD [facility]   IR GENERIC HISTORICAL  07/20/2016   IR REMOVAL TUN ACCESS W/ PORT W/O FL MOD SED 07/20/2016 Malachy Moan, MD [facility]   left salivary Left    removed    Current Medications: Current Meds  Medication Sig   amLODipine (NORVASC) 10 MG tablet TAKE 1 TABLET BY MOUTH EVERY EVENING.   b complex vitamins tablet Take 1 tablet by mouth daily. Unknown strenght   clopidogrel (PLAVIX) 75 MG tablet TAKE 1 TABLET BY MOUTH EVERY EVENING.   lisinopril (ZESTRIL) 20 MG tablet TAKE 1 TABLET BY MOUTH EVERY EVENING.   rosuvastatin (CRESTOR) 10 MG tablet TAKE 1  TABLET BY MOUTH DAILY.   tamsulosin (FLOMAX) 0.4 MG CAPS capsule Take 0.4 mg by mouth daily.   traZODone (DESYREL) 100 MG tablet Take 2 tablets (200 mg total) by mouth at bedtime.     Allergies:   Patient has no known allergies.   Social History   Socioeconomic History   Marital status: Married    Spouse name: Not on file   Number of children: Not on file   Years of education: Not on file   Highest education level: Not on file  Occupational History   Occupation: meter specialist  Tobacco Use   Smoking status: Former    Current packs/day: 0.00    Average packs/day: 2.0 packs/day for 16.0 years (32.0 ttl pk-yrs)    Types: Cigarettes    Start date: 12/22/1989    Quit date: 12/22/2005    Years since quitting: 17.4   Smokeless tobacco: Current    Types: Chew  Vaping Use   Vaping status: Never Used  Substance and Sexual Activity   Alcohol use: No   Drug use: No   Sexual activity: Never  Other Topics Concern   Not on file  Social History Narrative   Not on file   Social Drivers of Health   Financial Resource Strain: Not on file  Food Insecurity: Not on file  Transportation Needs: Not on file  Physical Activity: Not on file  Stress:  Not on file  Social Connections: Not on file     Family History: The patient's family history includes Alzheimer's disease (age of onset: 47) in his father. ROS:   Please see the history of present illness.    All 14 point review of systems negative except as described per history of present illness  EKGs/Labs/Other Studies Reviewed:         Recent Labs: 03/09/2023: ALT 24; BUN 13; Creatinine, Ser 1.00; Hemoglobin 13.3; Platelets 228; Potassium 5.2; Sodium 139; TSH 1.760  Recent Lipid Panel    Component Value Date/Time   CHOL 97 (L) 03/09/2023 1426   TRIG 73 03/09/2023 1426   HDL 40 03/09/2023 1426   CHOLHDL 2.4 03/09/2023 1426   LDLCALC 42 03/09/2023 1426    Physical Exam:    VS:  BP 138/70 (BP Location: Right Arm, Patient  Position: Sitting)   Pulse 71   Ht 6\' 1"  (1.854 m)   Wt 206 lb (93.4 kg)   SpO2 95%   BMI 27.18 kg/m     Wt Readings from Last 3 Encounters:  05/16/23 206 lb (93.4 kg)  03/09/23 205 lb (93 kg)  01/07/23 205 lb 6.4 oz (93.2 kg)     GEN:  Well nourished, well developed in no acute distress HEENT: Normal NECK: No JVD; No carotid bruits LYMPHATICS: No lymphadenopathy CARDIAC: RRR, systolic ejection murmur grade 1/6 best heard right upper portion of the sternum, no rubs, no gallops RESPIRATORY:  Clear to auscultation without rales, wheezing or rhonchi  ABDOMEN: Soft, non-tender, non-distended MUSCULOSKELETAL:  No edema; No deformity  SKIN: Warm and dry LOWER EXTREMITIES: no swelling NEUROLOGIC:  Alert and oriented x 3 PSYCHIATRIC:  Normal affect   ASSESSMENT:    1. Nonrheumatic aortic valve stenosis   2. Essential hypertension   3. PVD (peripheral vascular disease) (HCC)   4. Bilateral carotid artery stenosis    PLAN:    In order of problems listed above:  Nonrheumatic arctic valve stenosis only mild.  Continue monitoring. Essential hypertension blood pressure well-controlled continue present management. Peripheral vascular disease in form of carotic arterial stenosis continue antiplatelet therapy statin.  I risk factors modifications carotic ultrasounds will be repeated next year. Bilateral carotic artery stenosis plan as described above   Medication Adjustments/Labs and Tests Ordered: Current medicines are reviewed at length with the patient today.  Concerns regarding medicines are outlined above.  No orders of the defined types were placed in this encounter.  Medication changes: No orders of the defined types were placed in this encounter.   Signed, Georgeanna Lea, MD, [facility] 05/16/2023 10:06 AM    [facility]

## 2023-07-08 ENCOUNTER — Ambulatory Visit: Payer: PPO | Admitting: Internal Medicine

## 2023-07-08 ENCOUNTER — Encounter: Payer: Self-pay | Admitting: Internal Medicine

## 2023-07-08 VITALS — BP 126/78 | HR 74 | Temp 97.7°F | Resp 18 | Ht 73.0 in | Wt 206.0 lb

## 2023-07-08 DIAGNOSIS — E7800 Pure hypercholesterolemia, unspecified: Secondary | ICD-10-CM | POA: Diagnosis not present

## 2023-07-08 DIAGNOSIS — R7303 Prediabetes: Secondary | ICD-10-CM | POA: Diagnosis not present

## 2023-07-08 DIAGNOSIS — I10 Essential (primary) hypertension: Secondary | ICD-10-CM

## 2023-07-08 NOTE — Assessment & Plan Note (Signed)
 His BP has been controlled this past year.  We will continue on his current BP meds.

## 2023-07-08 NOTE — Assessment & Plan Note (Signed)
 I will check his FLP on his next visit.  He has a history of PAD and carotid stenosis.  He remains on crestor  without any problems.

## 2023-07-08 NOTE — Progress Notes (Signed)
 Office Visit  Subjective   Patient ID: Erik Vaughn   DOB: 1950/07/06   Age: 73 y.o.   MRN: [id number]   Chief Complaint Chief Complaint  Patient presents with   Follow-up    4 month follow up     History of Present Illness The patient is a 73 year old male who returns for a follow-up visit for prediabetes.  We discovered on annual lab testing in 03/2023 that he developed prediabetes with a HgBA1c of 6%.   Since the last visit, there have been no problems. He is currently not on any medications and was asked to control this with diet and exercise.  He is trying to lay off sugars and he is walking for exercise.  He specifically denies unexplained abdominal pain, nausea or vomiting. He does not routinely check blood sugars. His last HgBA1c was done 4 months ago and was 6%.  He came in fasting today in anticipation of lab work.   The patient is a 73 year old Caucasian/White male who presents for a follow-up evaluation of hypertension.  Since his last visit, he has not had any problems.   He was diagnosed with HTN around 2015. The patient has not been checking his blood pressure at home. The patient's current medications include: amlodipine  10mg  daily and lisinopril  20mg  daily. The patient has been tolerating his medications well. The patient denies any headache, visual changes, dizziness, lightheadness, chest pain, shortness of breath, weakness/numbness, and edema.    The patient also has a history of HCL where he has peripheral arterial disease with carotid stenosis.  He is currently followed by Dr. MARLA who did a FLP on him in 01/2022 and his cholesterol was controlled.  He did have a repeat carotid US  on 03/08/2022 and this showed a right ICA consistent with a 1-39% stenosis and a left ICA consistent with a 1-39% stenosis.   He remains on risk factor modification and is on Plavix  daily. They also did ABI's on his legs from what he is telling me and he has some PAD in his legs as well.  Overall, he  states he is doing well and is without any complaints or problems at this time. He specifically denies abdominal pain, nausea, vomiting, diarrhea, myalgias, and fatigue. He remains on dietary management as well as the following cholesterol lowering medications rosuvastatin  10 mg tablet. He is fasting in anticipation for labs today.    The patient returns today for followup of his anxiety and insomnia. He had some anxiousness when he started chemotherapy for his Burkett's Lymphoma in 2017. He was having stressors with his wife having some health problems and he states he was concerned about his BPH with Dr. Marda.  He has no further problems with depression or anxiety.  He does have a history of insomnia where he was on trazodone  but the patient started weaning himself off this medication and quit trazodone  about 6 weeks.  He states he is sleeping well.      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 Unknown strenght, Disp: , Rfl:    clopidogrel  (PLAVIX ) 75 MG tablet, TAKE 1 TABLET BY MOUTH EVERY EVENING., Disp: 90 tablet, Rfl: 2   lisinopril  (ZESTRIL ) 20 MG tablet, TAKE 1 TABLET BY MOUTH EVERY EVENING., Disp: 90 tablet, Rfl: 2   rosuvastatin  (CRESTOR ) 10 MG tablet, TAKE 1 TABLET BY MOUTH DAILY., Disp: 90 tablet, Rfl: 2   tamsulosin (FLOMAX) 0.4 MG CAPS capsule, Take 0.4 mg by mouth  daily., Disp: , Rfl:    Review of Systems Review of Systems  Constitutional:  Negative for chills, fever and malaise/fatigue.  Eyes:  Negative for blurred vision and double vision.  Respiratory:  Negative for cough and shortness of breath.   Cardiovascular:  Negative for chest pain, palpitations and leg swelling.  Gastrointestinal:  Negative for abdominal pain, constipation, diarrhea, nausea and vomiting.  Genitourinary:  Negative for frequency.  Musculoskeletal:  Negative for myalgias.  Skin:  Negative for itching and rash.  Neurological:  Negative for dizziness, weakness and headaches.  Endo/Heme/Allergies:  Negative for polydipsia.       Objective:    Vitals BP 126/78 (BP Location: Left Arm, Patient Position: Sitting, Cuff Size: Normal)   Pulse 74   Temp 97.7 F (36.5 C)   Resp 18   Ht 6' 1 (1.854 m)   Wt 206 lb (93.4 kg)   SpO2 99%   BMI 27.18 kg/m    Physical Examination Physical Exam Constitutional:      Appearance: Normal appearance. He is not ill-appearing.  Cardiovascular:     Rate and Rhythm: Normal rate and regular rhythm.     Pulses: Normal pulses.     Heart sounds: No murmur heard.    No friction rub. No gallop.  Pulmonary:     Effort: Pulmonary effort is normal. No respiratory distress.     Breath sounds: No wheezing, rhonchi or rales.  Abdominal:     General: Abdomen is flat. Bowel sounds are normal. There is no distension.     Palpations: Abdomen is soft.     Tenderness: There is no abdominal tenderness.  Musculoskeletal:     Right lower leg: No edema.     Left lower leg: No edema.  Skin:    General: Skin is warm and dry.     Findings: No rash.  Neurological:     General: No focal deficit present.     Mental Status: He is alert and oriented to person, place, and time.  Psychiatric:        Mood and Affect: Mood normal.        Behavior: Behavior normal.        Assessment & Plan:   Essential hypertension His BP has been controlled this  past year.  We will continue on his current BP meds.  Hypercholesterolemia I will check his FLP on his next visit.  He has a history of PAD and carotid stenosis.  He remains on crestor  without any problems.  Prediabetes He will continue to watch his diet and continue to exercise.  WE will check his HgBA1c on his next visit.    Return in about 4 months (around 11/05/2023).   Selinda Fleeta Finger, MD

## 2023-07-08 NOTE — Assessment & Plan Note (Signed)
 He will continue to watch his diet and continue to exercise.  WE will check his HgBA1c on his next visit.

## 2023-09-05 DIAGNOSIS — H04123 Dry eye syndrome of bilateral lacrimal glands: Secondary | ICD-10-CM | POA: Diagnosis not present

## 2023-09-05 DIAGNOSIS — H524 Presbyopia: Secondary | ICD-10-CM | POA: Diagnosis not present

## 2023-09-19 ENCOUNTER — Other Ambulatory Visit: Payer: Self-pay | Admitting: Cardiology

## 2023-09-19 DIAGNOSIS — I739 Peripheral vascular disease, unspecified: Secondary | ICD-10-CM

## 2023-09-19 DIAGNOSIS — C8378 Burkitt lymphoma, lymph nodes of multiple sites: Secondary | ICD-10-CM

## 2023-09-19 NOTE — Telephone Encounter (Signed)
 Prescriptions sent to pharmacy

## 2023-10-05 DIAGNOSIS — R972 Elevated prostate specific antigen [PSA]: Secondary | ICD-10-CM | POA: Diagnosis not present

## 2023-10-05 DIAGNOSIS — R339 Retention of urine, unspecified: Secondary | ICD-10-CM | POA: Diagnosis not present

## 2023-10-05 DIAGNOSIS — N401 Enlarged prostate with lower urinary tract symptoms: Secondary | ICD-10-CM | POA: Diagnosis not present

## 2023-10-13 ENCOUNTER — Ambulatory Visit (HOSPITAL_COMMUNITY)

## 2023-11-01 ENCOUNTER — Ambulatory Visit (INDEPENDENT_AMBULATORY_CARE_PROVIDER_SITE_OTHER)
Admission: RE | Admit: 2023-11-01 | Discharge: 2023-11-01 | Disposition: A | Discharge status: Home / Self Care | Source: Ambulatory Visit | Attending: Cardiology | Admitting: Cardiology

## 2023-11-01 DIAGNOSIS — I6523 Occlusion and stenosis of bilateral carotid arteries: Secondary | ICD-10-CM

## 2023-11-04 ENCOUNTER — Ambulatory Visit: Payer: PPO | Admitting: Internal Medicine

## 2023-11-07 ENCOUNTER — Encounter

## 2023-11-10 ENCOUNTER — Encounter: Payer: Self-pay | Admitting: Internal Medicine

## 2023-11-10 ENCOUNTER — Ambulatory Visit: Admitting: Internal Medicine

## 2023-11-10 VITALS — BP 130/70 | HR 74 | Temp 98.2°F | Resp 18 | Ht 73.0 in | Wt 200.5 lb

## 2023-11-10 DIAGNOSIS — I10 Essential (primary) hypertension: Secondary | ICD-10-CM | POA: Diagnosis not present

## 2023-11-10 DIAGNOSIS — E7800 Pure hypercholesterolemia, unspecified: Secondary | ICD-10-CM | POA: Diagnosis not present

## 2023-11-10 DIAGNOSIS — R7303 Prediabetes: Secondary | ICD-10-CM | POA: Diagnosis not present

## 2023-11-10 NOTE — Assessment & Plan Note (Signed)
 His BP remains controlled at this time.  We will continue his current antihypertensives.

## 2023-11-10 NOTE — Assessment & Plan Note (Signed)
 We will check a HgBA1c on him today.  He controls his prediabetes with diet and exercise.

## 2023-11-10 NOTE — Progress Notes (Signed)
 Office Visit  Subjective   Patient ID: Erik Vaughn   DOB: 1950-08-25   Age: 73 y.o.   MRN: [id number]   Chief Complaint Chief Complaint  Patient presents with   Follow-up    4 Month follow up   Diabetes    4 month follow up     History of Present Illness The patient is a 73 year old male who returns for a follow-up visit for prediabetes.  We discovered on annual lab testing in 03/2023 that he developed prediabetes with a HgBA1c of 6%.   Since the last visit, there have been no problems. He is currently not on any medications and was asked to control this with diet and exercise.  He is trying to lay off sugars and he is walking for exercise.  He specifically denies unexplained abdominal pain, nausea or vomiting. He does not routinely check blood sugars. His last HgBA1c was done 8 months ago and was 6%.  He came in fasting today in anticipation of lab work.    The patient is a 73 year old Caucasian/White male who presents for a follow-up evaluation of hypertension.  He was diagnosed with HTN around 2015. The patient has not been checking his blood pressure at home. The patient's current medications include: amlodipine  10mg  daily and lisinopril  20mg  daily. The patient has been tolerating his medications well. The patient denies any headache, visual changes, dizziness, lightheadness, chest pain, shortness of breath, weakness/numbness, and edema.    The patient also has a history of HCL where he has peripheral arterial disease with carotid stenosis.  He is currently followed by Dr. Linnell Richardson who did a FLP on him in 01/2022 and his cholesterol was controlled.  He did have a repeat carotid US  on 03/08/2022 and this showed a right ICA consistent with a 1-39% stenosis and a left ICA consistent with a 1-39% stenosis.   He remains on risk factor modification and is on Plavix  daily. They also did ABI's on his legs from what he is telling me and he has some PAD in his legs as well.  Overall, he states he is doing  well and is without any complaints or problems at this time. He specifically denies abdominal pain, nausea, vomiting, diarrhea, myalgias, and fatigue. He remains on dietary management as well as the following cholesterol lowering medications rosuvastatin  10 mg tablet. He is fasting in anticipation for labs today.        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 Unknown strenght, Disp: , Rfl:    clopidogrel  (PLAVIX ) 75 MG tablet, TAKE 1 TABLET BY MOUTH EVERY EVENING., Disp: 90 tablet, Rfl: 1   lisinopril  (ZESTRIL ) 20 MG tablet, TAKE 1 TABLET BY MOUTH EVERY EVENING., Disp:  90 tablet, Rfl: 1   rosuvastatin  (CRESTOR ) 10 MG tablet, TAKE 1 TABLET BY MOUTH DAILY., Disp: 90 tablet, Rfl: 1   tamsulosin (FLOMAX) 0.4 MG CAPS capsule, Take 0.4 mg by mouth daily., Disp: , Rfl:    Review of Systems Review of Systems  Constitutional:  Negative for chills, fever and malaise/fatigue.  Eyes:  Negative for blurred vision and double vision.  Respiratory:  Negative for cough and shortness of breath.   Cardiovascular:  Negative for chest pain, palpitations and leg swelling.  Gastrointestinal:  Negative for abdominal pain, constipation, diarrhea, nausea and vomiting.  Genitourinary:  Negative for frequency.   Musculoskeletal:  Negative for myalgias.  Skin:  Negative for itching and rash.  Neurological:  Negative for dizziness, weakness and headaches.  Endo/Heme/Allergies:  Negative for polydipsia.       Objective:    Vitals BP 130/70 (BP Location: Left Arm, Patient Position: Sitting, Cuff Size: Normal)   Pulse 74   Temp 98.2 F (36.8 C)   Resp 18   Ht 6' 1 (1.854 m)   Wt 200 lb 8 oz (90.9 kg)   SpO2 96%   BMI 26.45 kg/m    Physical Examination Physical Exam Constitutional:      Appearance: Normal appearance. He is not ill-appearing.   Cardiovascular:     Rate and Rhythm: Normal rate and regular rhythm.     Pulses: Normal pulses.     Heart sounds: No murmur heard.    No friction rub. No gallop.  Pulmonary:     Effort: Pulmonary effort is normal. No respiratory distress.     Breath sounds: No wheezing, rhonchi or rales.  Abdominal:     General: Abdomen is flat. Bowel sounds are normal. There is no distension.     Palpations: Abdomen is soft.     Tenderness: There is no abdominal tenderness.   Musculoskeletal:     Right lower leg: No edema.     Left lower leg: No edema.   Skin:    General: Skin is warm and dry.     Findings: No rash.   Neurological:     General: No focal deficit present.     Mental Status: He is alert and oriented to person, place, and time.   Psychiatric:        Mood and Affect: Mood normal.        Behavior: Behavior normal.        Assessment & Plan:   Essential hypertension His BP remains controlled at this time.  We will continue his current antihypertensives.  Hypercholesterolemia I am going to check his FLP today and CMP.  He is to continue on his crestor .  Prediabetes We will check a HgBA1c on him today.  He controls his prediabetes with diet and exercise.    Return in about 4 months (around 03/11/2024) for annual.   Wayne Haines, MD

## 2023-11-10 NOTE — Assessment & Plan Note (Signed)
 I am going to check his FLP today and CMP.  He is to continue on his crestor .

## 2023-11-11 LAB — LIPID PANEL
Chol/HDL Ratio: 2.8 ratio (ref 0.0–5.0)
Cholesterol, Total: 86 mg/dL — ABNORMAL LOW (ref 100–199)
HDL: 31 mg/dL — ABNORMAL LOW (ref >39)
LDL Chol Calc (NIH): 34 mg/dL (ref 0–99)
Triglycerides: 111 mg/dL (ref 0–149)
VLDL Cholesterol Cal: 21 mg/dL (ref 5–40)

## 2023-11-11 LAB — CMP14 + ANION GAP
ALT: 26 IU/L (ref 0–44)
AST: 26 IU/L (ref 0–40)
Albumin: 4.4 g/dL (ref 3.8–4.8)
Alkaline Phosphatase: 107 IU/L (ref 44–121)
Anion Gap: 17 mmol/L (ref 10.0–18.0)
BUN/Creatinine Ratio: 20 (ref 10–24)
BUN: 14 mg/dL (ref 8–27)
Bilirubin Total: 0.5 mg/dL (ref 0.0–1.2)
CO2: 22 mmol/L (ref 20–29)
Calcium: 9.3 mg/dL (ref 8.6–10.2)
Chloride: 102 mmol/L (ref 96–106)
Creatinine, Ser: 0.71 mg/dL — ABNORMAL LOW (ref 0.76–1.27)
Globulin, Total: 2.1 g/dL (ref 1.5–4.5)
Glucose: 108 mg/dL — ABNORMAL HIGH (ref 70–99)
Potassium: 4.5 mmol/L (ref 3.5–5.2)
Sodium: 141 mmol/L (ref 134–144)
Total Protein: 6.5 g/dL (ref 6.0–8.5)
eGFR: 98 mL/min/{1.73_m2} (ref >59)

## 2023-11-11 LAB — HEMOGLOBIN A1C
Est. average glucose Bld gHb Est-mCnc: 126 mg/dL
Hgb A1c MFr Bld: 6 % — ABNORMAL HIGH (ref 4.8–5.6)

## 2023-11-14 ENCOUNTER — Ambulatory Visit: Attending: Cardiology | Admitting: Cardiology

## 2023-11-14 ENCOUNTER — Encounter: Payer: Self-pay | Admitting: Cardiology

## 2023-11-14 VITALS — BP 136/60 | HR 78 | Resp 18 | Ht 72.0 in | Wt 200.4 lb

## 2023-11-14 DIAGNOSIS — I6523 Occlusion and stenosis of bilateral carotid arteries: Secondary | ICD-10-CM | POA: Diagnosis not present

## 2023-11-14 DIAGNOSIS — I739 Peripheral vascular disease, unspecified: Secondary | ICD-10-CM

## 2023-11-14 DIAGNOSIS — I350 Nonrheumatic aortic (valve) stenosis: Secondary | ICD-10-CM | POA: Diagnosis not present

## 2023-11-14 DIAGNOSIS — I10 Essential (primary) hypertension: Secondary | ICD-10-CM | POA: Diagnosis not present

## 2023-11-14 DIAGNOSIS — E785 Hyperlipidemia, unspecified: Secondary | ICD-10-CM | POA: Diagnosis not present

## 2023-11-14 NOTE — Patient Instructions (Signed)
Medication Instructions:  Your physician recommends that you continue on your current medications as directed. Please refer to the Current Medication list given to you today.  *If you need a refill on your cardiac medications before your next appointment, please call your pharmacy*   Lab Work: None ordered If you have labs (blood work) drawn today and your tests are completely normal, you will receive your results only by: MyChart Message (if you have MyChart) OR A paper copy in the mail If you have any lab test that is abnormal or we need to change your treatment, we will call you to review the results.   Testing/Procedures: None ordered   Follow-Up: At [facility], you and your health needs are our priority.  As part of our continuing mission to provide you with exceptional heart care, we have created designated Provider Care Teams.  These Care Teams include your primary Cardiologist (physician) and Advanced Practice Providers (APPs -  Physician Assistants and Nurse Practitioners) who all work together to provide you with the care you need, when you need it.  We recommend signing up for the patient portal called "MyChart".  Sign up information is provided on this After Visit Summary.  MyChart is used to connect with patients for Virtual Visits (Telemedicine).  Patients are able to view lab/test results, encounter notes, upcoming appointments, etc.  Non-urgent messages can be sent to your provider as well.   To learn more about what you can do with MyChart, go to [email or web address].    Your next appointment:   6 month(s)  The format for your next appointment:   In Person  Provider:   Gypsy Balsam, MD    Other Instructions none  Important Information About Sugar

## 2023-11-14 NOTE — Progress Notes (Signed)
 Cardiology Office Note:    Date:  11/14/2023   ID:  Erik Vaughn, DOB [birth year], MRN [id number]  PCP:  Erik Haines, MD  Cardiologist:  Erik Burger, MD    Referring MD: Erik Vaughn, Erik Caulk, MD   No chief complaint on file.   History of Present Illness:    Erik Vaughn is a 73 y.o. male past medical history significant for essential hypertension, mild aortic stenosis, peripheral vascular disease, peripheral carotic arterial stenosis up to 59% stenosis on the left.  Comes today 2 months for follow-up.  Overall doing well.  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) 03/08/2016   Carotid stenosis    Claudication (HCC) 04/28/2015   Dyslipidemia 04/28/2015   Encounter for antineoplastic chemotherapy    Essential hypertension 04/28/2015   Murmur 02/07/2016   Neoplasm related pain    PAD (peripheral artery disease) (HCC) left leg    Past Surgical History:  Procedure Laterality Date   IR GENERIC HISTORICAL  12/31/2015   IR FLUORO GUIDE CV LINE RIGHT 12/31/2015 [name] Silvas, MD [facility]   IR GENERIC HISTORICAL  12/31/2015   IR US  GUIDE VASC ACCESS RIGHT 12/31/2015 [name] Silvas, MD [facility]   IR GENERIC HISTORICAL  07/20/2016   IR REMOVAL TUN ACCESS W/ PORT W/O FL MOD SED 07/20/2016 Fernando Hoyer, MD [facility]   left salivary Left    removed    Current Medications: Current Meds  Medication Sig   amLODipine  (NORVASC ) 10 MG tablet TAKE 1 TABLET BY MOUTH EVERY EVENING.   clopidogrel  (PLAVIX ) 75 MG tablet TAKE 1 TABLET BY MOUTH EVERY EVENING.   finasteride (PROSCAR) 5 MG tablet Take 5 mg by mouth daily.   lisinopril  (ZESTRIL ) 20 MG tablet TAKE 1 TABLET BY MOUTH EVERY EVENING.   rosuvastatin  (CRESTOR ) 10 MG tablet TAKE 1 TABLET BY MOUTH  DAILY.   tamsulosin (FLOMAX) 0.4 MG CAPS capsule Take 0.4 mg by mouth daily.     Allergies:   Patient has no known allergies.   Social History   Socioeconomic History   Marital status: Married    Spouse name: Not on file   Number of children: Not on file   Years of education: Not on file   Highest education level: Not on file  Occupational History   Occupation: meter specialist  Tobacco Use   Smoking status: Former    Current packs/day: 0.00    Average packs/day: 2.0 packs/day for 16.0 years (32.0 ttl pk-yrs)    Types: Cigarettes    Start date: 12/22/1989    Quit date: 12/22/2005    Years since quitting: 17.9   Smokeless tobacco: Current    Types: Chew  Vaping Use   Vaping status: Never Used  Substance and Sexual Activity   Alcohol  use: No   Drug use: No   Sexual activity: Never  Other Topics Concern   Not on file  Social History Narrative   Not on file   Social Drivers of Health   Financial Resource Strain: Not on file  Food Insecurity: Not on file  Transportation Needs: Not on file  Physical Activity: Not on file  Stress: Not on file  Social Connections: Not on file     Family History: The patient's family history includes Alzheimer's  disease (age of onset: 55) in his father. ROS:   Please see the history of present illness.    All 14 point review of systems negative except as described per history of present illness  EKGs/Labs/Other Studies Reviewed:         Recent Labs: 03/09/2023: Hemoglobin 13.3; Platelets 228; TSH 1.760 11/10/2023: ALT 26; BUN 14; Creatinine, Ser 0.71; Potassium 4.5; Sodium 141  Recent Lipid Panel    Component Value Date/Time   CHOL 86 (L) 11/10/2023 1053   TRIG 111 11/10/2023 1053   HDL 31 (L) 11/10/2023 1053   CHOLHDL 2.8 11/10/2023 1053   LDLCALC 34 11/10/2023 1053    Physical Exam:    VS:  BP 136/60 (BP Location: Left Arm, Patient Position: Sitting, Cuff Size: Normal)   Pulse 78   Resp 18   Ht 6' (1.829 m)   Wt 200  lb 6.4 oz (90.9 kg)   SpO2 94%   BMI 27.18 kg/m     Wt Readings from Last 3 Encounters:  11/14/23 200 lb 6.4 oz (90.9 kg)  11/10/23 200 lb 8 oz (90.9 kg)  07/08/23 206 lb (93.4 kg)     GEN:  Well nourished, well developed in no acute distress HEENT: Normal NECK: No JVD; No carotid bruits LYMPHATICS: No lymphadenopathy CARDIAC: RRR, no murmurs, no rubs, no gallops RESPIRATORY:  Clear to auscultation without rales, wheezing or rhonchi  ABDOMEN: Soft, non-tender, non-distended MUSCULOSKELETAL:  No edema; No deformity  SKIN: Warm and dry LOWER EXTREMITIES: no swelling NEUROLOGIC:  Alert and oriented x 3 PSYCHIATRIC:  Normal affect   ASSESSMENT:    1. Nonrheumatic aortic valve stenosis   2. Essential hypertension   3. PVD (peripheral vascular disease) (HCC)   4. Bilateral carotid artery stenosis   5. Dyslipidemia    PLAN:    In order of problems listed above:  Nonrheumatic aortic valve stenosis, only mild based on last assessment.  Continue monitoring. Carotic arterial disease unchanged compared to prior up to 59% stenosis on the left.  Continue antiplatelet therapy and statin. Dyslipidemia cholesterol well-controlled we will continue present management I did review cholesterol done by his primary care physician which was done just 4 days ago total cholesterol 86 HDL 31 LDL 34 we will continue present management   Medication Adjustments/Labs and Tests Ordered: Current medicines are reviewed at length with the patient today.  Concerns regarding medicines are outlined above.  No orders of the defined types were placed in this encounter.  Medication changes: No orders of the defined types were placed in this encounter.   Signed, Manfred Seed, MD, [facility] 11/14/2023 1:42 PM    [facility]

## 2023-11-17 ENCOUNTER — Ambulatory Visit: Payer: Self-pay

## 2023-11-17 NOTE — Progress Notes (Signed)
 Patient called.  Patient aware.  I have called and informed the patient  His prediabetes and cholesterol labs look good. Erik Vaughn  Pt aware.

## 2024-01-05 DIAGNOSIS — R972 Elevated prostate specific antigen [PSA]: Secondary | ICD-10-CM | POA: Diagnosis not present

## 2024-01-05 DIAGNOSIS — N401 Enlarged prostate with lower urinary tract symptoms: Secondary | ICD-10-CM | POA: Diagnosis not present

## 2024-01-05 DIAGNOSIS — R339 Retention of urine, unspecified: Secondary | ICD-10-CM | POA: Diagnosis not present

## 2024-01-19 ENCOUNTER — Other Ambulatory Visit: Payer: Self-pay

## 2024-01-19 DIAGNOSIS — C8378 Burkitt lymphoma, lymph nodes of multiple sites: Secondary | ICD-10-CM

## 2024-01-20 ENCOUNTER — Inpatient Hospital Stay: Payer: PPO | Attending: Hematology

## 2024-01-20 ENCOUNTER — Inpatient Hospital Stay: Payer: PPO | Admitting: Hematology

## 2024-01-20 VITALS — BP 152/77 | HR 69 | Temp 98.1°F | Resp 20 | Wt 199.8 lb

## 2024-01-20 DIAGNOSIS — C837A Burkitt lymphoma, in remission: Secondary | ICD-10-CM | POA: Diagnosis not present

## 2024-01-20 DIAGNOSIS — C8378 Burkitt lymphoma, lymph nodes of multiple sites: Secondary | ICD-10-CM

## 2024-01-20 LAB — CMP (CANCER CENTER ONLY)
ALT: 25 U/L (ref 0–44)
AST: 25 U/L (ref 15–41)
Albumin: 4.6 g/dL (ref 3.5–5.0)
Alkaline Phosphatase: 93 U/L (ref 38–126)
Anion gap: 5 (ref 5–15)
BUN: 17 mg/dL (ref 8–23)
CO2: 28 mmol/L (ref 22–32)
Calcium: 9.7 mg/dL (ref 8.9–10.3)
Chloride: 105 mmol/L (ref 98–111)
Creatinine: 0.71 mg/dL (ref 0.61–1.24)
GFR, Estimated: >60 mL/min (ref >60)
Glucose, Bld: 100 mg/dL — ABNORMAL HIGH (ref 70–99)
Potassium: 4.6 mmol/L (ref 3.5–5.1)
Sodium: 138 mmol/L (ref 135–145)
Total Bilirubin: 0.5 mg/dL (ref 0.0–1.2)
Total Protein: 7 g/dL (ref 6.5–8.1)

## 2024-01-20 LAB — CBC WITH DIFFERENTIAL (CANCER CENTER ONLY)
Abs Immature Granulocytes: 0.02 K/uL (ref 0.00–0.07)
Basophils Absolute: 0.1 K/uL (ref 0.0–0.1)
Basophils Relative: 1 %
Eosinophils Absolute: 0.6 K/uL — ABNORMAL HIGH (ref 0.0–0.5)
Eosinophils Relative: 7 %
HCT: 39.2 % (ref 39.0–52.0)
Hemoglobin: 13.1 g/dL (ref 13.0–17.0)
Immature Granulocytes: 0 %
Lymphocytes Relative: 21 %
Lymphs Abs: 1.9 K/uL (ref 0.7–4.0)
MCH: 29.6 pg (ref 26.0–34.0)
MCHC: 33.4 g/dL (ref 30.0–36.0)
MCV: 88.7 fL (ref 80.0–100.0)
Monocytes Absolute: 0.8 K/uL (ref 0.1–1.0)
Monocytes Relative: 9 %
Neutro Abs: 5.7 K/uL (ref 1.7–7.7)
Neutrophils Relative %: 62 %
Platelet Count: 206 K/uL (ref 150–400)
RBC: 4.42 MIL/uL (ref 4.22–5.81)
RDW: 13.3 % (ref 11.5–15.5)
WBC Count: 9 K/uL (ref 4.0–10.5)
nRBC: 0 % (ref 0.0–0.2)

## 2024-01-20 LAB — LACTATE DEHYDROGENASE: LDH: 146 U/L (ref 98–192)

## 2024-01-26 NOTE — Progress Notes (Signed)
 HEMATOLOGY/ONCOLOGY CLINIC NOTE  Date of Service: .01/20/2024   Patient Care Team: [name], MD as PCP - General (Internal Medicine) [name], MD as Consulting Physician (Orthopedic Surgery)  CHIEF COMPLAINTS/PURPOSE OF CONSULTATION:  Follow-up for Burkitt's lymphoma DIAGNOSIS  Burkitts Lymphoma currently in remission  Stage IVB Burkitt's lymphoma with t(8;14) translocation diagnosed 12/23/2015. This appears to be involving the sacrum causing sacral and root involvement with possible neurogenic bladder. PET CT scan as noted above shows extensive involvement with lymphoma. CSF negative for involvement. Bone marrow biopsy negative for involvement by lymphoma.  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 PET CT scan as noted above shows extensive involvement with lymphoma. CSF negative for involvement. Bone marrow biopsy negative for involvement by lymphoma. PET/CT scan after 3 cycles of EPOCH-R  on 03/02/2016  showed significant interval metabolic response.  Patient has completed cycle 6 of EPOCH-R and tolerated it well with no prohibitive toxicities. Grade 1 fatigue.  He has had some episodes of fevers including a periodontal abscess,  non-neutropenic fevers - unclear etiology, recent Escherichia coli urinary tract infection x 2 and Clostridium difficile diarrhea.  PET CT scan done after completion of this planned chemotherapy shows no clear evidence of residual lymphoma . Some borderline FDG avidity in the left sacral ala ?pathologic fracture vs residual lymphoma  Patient was evaluated by radiation oncology - no RT recommended to sacral lesion.  CURRENT TREATMENT -Active surveillance  Previous treatment The patient has completed 6 cycles of EPOCH -R and IT methotrexate  x 4 doses starting with cycle 3 for CNS prophylaxis   HISTORY OF  PRESENTING ILLNESS:  Please see my previous note for details  INTERVAL HISTORY:   Erik Vaughn is a 73 y.o. male is here for his 1 year follow-up for Burkitt's lymphoma.  He notes no acute new symptoms since his last clinic visit.  Expresses significant gratitude for all his cares.. No fevers no chills no night sweats no unexpected weight loss.  No new lumps or bumps.  Notes no chest pain or shortness of breath.  No new abdominal pain or distention. Endorses been feeling well overall.  He is moving closer to [place]. Discussed that he is now more than 8 years out from his full remission posttreatment and the risk of the Burkitt's lymphoma coming back is less than 5% at this time.  Would recommend continued follow-up with his new PCP closer to where she will be moving.   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   He smoked about 2 packs a day for 40 years.  SURGICAL HISTORY:  #1 left submandibular salivary gland benign tumor excision #2 port placement - 12/31/2015  SOCIAL HISTORY: Social History   Socioeconomic History   Marital status: Married    Spouse name: Not on file   Number of children: Not on file   Years of education: Not on file   Highest education level: Not on file  Occupational History   Occupation: meter specialist  Tobacco Use   Smoking status: Former    Current packs/day: 0.00    Average packs/day: 2.0 packs/day for 16.0 years (32.0 ttl pk-yrs)    Types: Cigarettes    Start date: 12/22/1989    Quit date:  12/22/2005    Years since quitting: 18.1   Smokeless tobacco: Current    Types: Chew  Vaping Use   Vaping status: Never Used  Substance and Sexual Activity   Alcohol  use: No   Drug use: No   Sexual activity: Never  Other Topics Concern   Not on file  Social History  Narrative   Not on file   Social Drivers of Health   Financial Resource Strain: Not on file  Food Insecurity: Not on file  Transportation Needs: Not on file  Physical Activity: Not on file  Stress: Not on file  Social Connections: Not on file  Intimate Partner Violence: Not on file   smoked 2 packs of cigarettes per day for 40 years quit about 10 years ago. Denies significant alcohol  use. 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  FAMILY HISTORY:  Sister had breast cancer at age 68 years. Unknown if she had genetic testing One maternal uncle had melanoma Second maternal uncle had pancreatic cancer under the age of 39yr Paternal uncle with prostate cancer Dad had prostate cancer at age 32 years   ALLERGIES:  has no known allergies.  MEDICATIONS:  Current Outpatient Medications  Medication Sig Dispense Refill   amLODipine  (NORVASC ) 10 MG tablet TAKE 1 TABLET BY MOUTH EVERY EVENING. 90 tablet 1   b complex vitamins tablet Take 1 tablet by mouth daily. Unknown strenght     clopidogrel  (PLAVIX ) 75 MG tablet TAKE 1 TABLET BY MOUTH EVERY EVENING. 90 tablet 1   finasteride (PROSCAR) 5 MG tablet Take 5 mg by mouth daily.     lisinopril  (ZESTRIL ) 20 MG tablet TAKE 1 TABLET BY MOUTH EVERY EVENING. 90 tablet 1   rosuvastatin  (CRESTOR ) 10 MG tablet TAKE 1 TABLET BY MOUTH DAILY. 90 tablet 1   tamsulosin (FLOMAX) 0.4 MG CAPS capsule Take 0.4 mg by mouth daily.     No current facility-administered medications for this visit.    REVIEW OF SYSTEMS:   .10 Point review of Systems was done is negative except as noted above.  PHYSICAL EXAMINATION: ECOG PERFORMANCE STATUS: 1 - Symptomatic but completely ambulatory   Today's Vitals   01/20/24 1351 01/20/24 1401  BP: (!) 160/76 (!) 152/77  Pulse: 69   Resp: 20   Temp:  98.1 F (36.7 C)   SpO2: 98%   Weight: 199 lb 12.8 oz (90.6 kg)   Body mass index is 27.1 kg/m.  GENERAL:alert, in no acute distress and comfortable SKIN: no acute rashes, no significant lesions EYES: conjunctiva are pink and non-injected, sclera anicteric OROPHARYNX: MMM, no exudates, no oropharyngeal erythema or ulceration NECK: supple, no JVD LYMPH:  no palpable lymphadenopathy in the cervical, axillary or inguinal regions LUNGS: clear to auscultation b/l with normal respiratory effort HEART: regular rate & rhythm ABDOMEN:  normoactive bowel sounds , non tender, not distended. Extremity: no pedal edema PSYCH: alert & oriented x 3 with fluent speech NEURO: no focal motor/sensory deficits   LABORATORY DATA:  I have reviewed the data as listed .    Latest Ref Rng & Units 01/20/2024    1:12 PM 03/09/2023    2:26 PM 01/07/2023    1:24 PM  CBC  WBC 4.0 - 10.5 K/uL 9.0  5.8  6.3   Hemoglobin 13.0 - 17.0 g/dL 13.1  13.3  13.2   Hematocrit 39.0 - 52.0 % 39.2  41.4  38.6   Platelets 150 - 400 K/uL 206  228  192     CBC    Component Value Date/Time   WBC 9.0 01/20/2024 1312   WBC 6.1 01/02/2021 1034   RBC 4.42 01/20/2024 1312   HGB 13.1 01/20/2024 1312   HGB 13.3 03/09/2023 1426   HGB 13.0 05/20/2017 1012   HCT 39.2 01/20/2024 1312   HCT 41.4 03/09/2023 1426   HCT 39.4 05/20/2017 1012   PLT 206 01/20/2024 1312   PLT 228 03/09/2023 1426   MCV 88.7 01/20/2024 1312   MCV 93 03/09/2023 1426   MCV 86.4 05/20/2017 1012   MCH 29.6 01/20/2024 1312   MCHC 33.4 01/20/2024 1312   RDW 13.3 01/20/2024 1312   RDW 13.0 03/09/2023 1426   RDW 13.7 05/20/2017 1012   LYMPHSABS 1.9 01/20/2024 1312   LYMPHSABS 1.6 03/09/2023 1426   LYMPHSABS 1.4 05/20/2017 1012   MONOABS 0.8 01/20/2024 1312   MONOABS 0.6 05/20/2017 1012   EOSABS 0.6 (H) 01/20/2024 1312   EOSABS 0.2 03/09/2023 1426   BASOSABS 0.1 01/20/2024 1312   BASOSABS 0.0 03/09/2023 1426   BASOSABS 0.0 05/20/2017 1012    ANC  100     Latest Ref Rng & Units 01/20/2024    1:12 PM 11/10/2023   10:53 AM 03/09/2023    2:26 PM  CMP  Glucose 70 - 99 mg/dL 899  891  895   BUN 8 - 23 mg/dL 17  14  13    Creatinine 0.61 - 1.24 mg/dL 9.28  9.28  8.99   Sodium 135 - 145 mmol/L 138  141  139   Potassium 3.5 - 5.1 mmol/L 4.6  4.5  5.2   Chloride 98 - 111 mmol/L 105  102  100   CO2 22 - 32 mmol/L 28  22  26    Calcium  8.9 - 10.3 mg/dL 9.7  9.3  89.7   Total Protein 6.5 - 8.1 g/dL 7.0  6.5  6.8   Total Bilirubin 0.0 - 1.2 mg/dL 0.5  0.5  0.5   Alkaline Phos 38 - 126 U/L 93  107  89   AST 15 - 41 U/L 25  26  27    ALT 0 - 44 U/L 25  26  24    . Lab Results  Component Value Date   LDH 146 01/20/2024    RADIOGRAPHIC STUDIES: I have personally reviewed the radiological images as listed and agreed with the findings in the report.  .No results found.  ASSESSMENT & PLAN:   73 y.o. 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  Patient was evaluated by radiation oncology and no ISRT was recommended. His LDH level appears to have been increased and he had a repeat PET/CT scan on 10/29/2015 that shows no overt evidence of lymphoma recurrence.  #2 s/p neutropenia post Rituxan  delayed neutropenia- remains resolved.  #3 s/p  left sacral ala and S1 fracture. L5-S1 and S1-S2 left-sided nerve root compression -resolved  #4 history of peripheral arterial disease  #5 chemotherapy related neuropathy - nearly resolved.  #6 Hypertension, dyslipidemia, peripheral arterial disease -Continue management as per primary care physician.  #7 Ex-smoker with 80-pack-year history of smoking.  PLAN:  - Discussed patient's labs from today with him in  detail CBC shows normal blood counts CMP is within normal limits LDH is normal at 146 Patient has no lab or clinical evidence of  Burkitt's lymphoma recurrence at this time. At this time he is about 8 years out from his treatment and remains in complete remission. [place]. We discussed that he has less than 5% chance of recurrence at this time and he can continue following with his primary care physician in the area that he is moving. Patient will be available if any other questions or concerns arise.  FOLLOW-UP: Return to care with PCP Return to clinic with Dr. Onesimo as needed  .The total time spent in the appointment was 20 minutes* .  All of the patient's questions were answered with apparent satisfaction. The patient knows to call the clinic with any problems, questions or concerns.   Emaline Onesimo MD MS AAHIVMS [facility] Hematology/Oncology Physician [facility]  .*Total Encounter Time as defined by the Centers for Medicare and Medicaid Services includes, in addition to the face-to-face time of a patient visit (documented in the note above) non-face-to-face time: obtaining and reviewing outside history, ordering and reviewing medications, tests or procedures, care coordination (communications with other health care professionals or caregivers) and documentation in the medical record.

## 2024-03-05 ENCOUNTER — Telehealth: Payer: Self-pay | Admitting: Cardiology

## 2024-03-05 NOTE — Telephone Encounter (Signed)
*  STAT* If patient is at the pharmacy, call can be transferred to refill team.   1. Which medications need to be refilled? (please list name of each medication and dose if known)   amLODipine  (NORVASC ) 10 MG tablet  clopidogrel  (PLAVIX ) 75 MG tablet  lisinopril  (ZESTRIL ) 20 MG tablet  rosuvastatin  (CRESTOR ) 10 MG tablet   2. Would you like to learn more about the convenience, safety, & potential cost savings by using the [facility]?    3. Are you open to using the [facility] (Type [facility]. ).   4. Which pharmacy/location (including street and city if local pharmacy) is medication to be sent to?  [facility] - [place],  - [address]   5. Do they need a 30 day or 90 day supply?   90 day  Patient stated he still has medication left.  Patient has appointment scheduled with Dr. Bernie on 12/31.

## 2024-03-12 ENCOUNTER — Encounter: Payer: Self-pay | Admitting: Internal Medicine

## 2024-03-12 ENCOUNTER — Ambulatory Visit: Admitting: Internal Medicine

## 2024-03-12 VITALS — BP 134/70 | HR 68 | Temp 98.1°F | Resp 16 | Ht 72.0 in | Wt 202.6 lb

## 2024-03-12 DIAGNOSIS — I6529 Occlusion and stenosis of unspecified carotid artery: Secondary | ICD-10-CM

## 2024-03-12 DIAGNOSIS — Z0000 Encounter for general adult medical examination without abnormal findings: Secondary | ICD-10-CM

## 2024-03-12 DIAGNOSIS — N400 Enlarged prostate without lower urinary tract symptoms: Secondary | ICD-10-CM

## 2024-03-12 DIAGNOSIS — R7303 Prediabetes: Secondary | ICD-10-CM | POA: Diagnosis not present

## 2024-03-12 DIAGNOSIS — I10 Essential (primary) hypertension: Secondary | ICD-10-CM | POA: Diagnosis not present

## 2024-03-12 DIAGNOSIS — I739 Peripheral vascular disease, unspecified: Secondary | ICD-10-CM | POA: Insufficient documentation

## 2024-03-12 DIAGNOSIS — Z860100 Personal history of colon polyps, unspecified: Secondary | ICD-10-CM

## 2024-03-12 DIAGNOSIS — Z8572 Personal history of non-Hodgkin lymphomas: Secondary | ICD-10-CM

## 2024-03-12 DIAGNOSIS — E7800 Pure hypercholesterolemia, unspecified: Secondary | ICD-10-CM

## 2024-03-12 DIAGNOSIS — I350 Nonrheumatic aortic (valve) stenosis: Secondary | ICD-10-CM

## 2024-03-12 DIAGNOSIS — G4700 Insomnia, unspecified: Secondary | ICD-10-CM

## 2024-03-12 DIAGNOSIS — Z6827 Body mass index (BMI) 27.0-27.9, adult: Secondary | ICD-10-CM

## 2024-03-12 NOTE — Progress Notes (Signed)
 Preventive Screening-Counseling & Management    Erik Vaughn is a 73 year old Caucasian/White male who presents for his annual wellness exam. He is due for the following health maintenance studies: colonoscopy, and screening labs. This patient's past medical history Benign Prostatic Hypertrophy, Burkett Lymphoma, Carotid Stenosis, Hypercholesterolemia, Hypertension, Neuropathy, and Peripheral Vascular Disease.    His last eye exam 12/2022 and he states his vision is doing well. There is no family history of colorectal cancer. He states his last colonoscopy was over 15 years ago by Dr. Charlanne. I do not have this report but he states that there was polyps. He has not had a repeat colonoscopy but he states that his prior PCP was doing stool guiac cards which have been negative.  He told me in 2023 he did not want anymore colonoscopies so we did a FIT test on him in 02/2022 and this was negative.  We did repeat a FIT test on him on 03/14/2023 and this was negative.  He states his father did have prostate cancer. He does see Dr. Marda for BPH where he is on Flomax. He denies any problems with urination.  He last saw Dr. Marda in 12/2023.  The patient does exercise by walking.  The patient has a history of tobacco abuse where he quit in 2014 where he smoked 1.5 ppd for 50 years.  He does get yearly flu vaccines. He states he did have his Prevnar 13 and pneumovax 23  vaccines in the past. He did get a shingrix vaccine in 2022.  He has refused RSV vaccine in the past.  He has had a total of 3 COVID-19 vaccines including 1 booster. There is no family history of CAD or strokes. He is not on an ASA but is on Plavix  75mg  daily.  He was also diagnosed with Burkett's Lymphoma in 2017.  He had Stage IVB Burkitt's lymphoma diagnosed in 11/2015.  This appeared to be involving the sacrum causing sacral and root involvement with possible neurogenic bladder.  He tells me he had a sacral fracture (not associated with chemo) that  he had a neuropathy of his left leg with some numbness and no pain.  The patient completed 6 cycles of EPOCH-R with IT methotrexate  and tolerated this well.  He had repeat PET scanning done after his chemotherapy which showed no evidence of residual lymphoma.  He has been remission since 03/2016.  He is having active surveillance yearly by Oncology with last visit in 01/20/2024.  They felt the patient has no lab or clinical evidence of Burkitt's lymphoma recurrence at this time and remains in remission.  The patient also has a history of HCL where he has peripheral arterial disease with carotid stenosis.  He is currently followed by Dr. MARLA who last saw him on 11/14/2023.  He had a history of non-rheumatic aortic valve stenosis that is mild.  He had a repeat carotid US  on 11/01/2023 that showed mild atherosclerotic changes in the proximal right internal carotid artery with estimated stenosis of less than 50%.  There was moderate atherosclerotic changes in the proximal left internal carotid artery with estimated stenosis measuring between 50 and 69%.  His previous carotid US  on 03/08/2022 and this showed a right ICA consistent with a 1-39% stenosis and a left ICA consistent with a 1-39% stenosis.   He remains on risk factor modification and is on Plavix  daily.  He has had ABI's on his legs in the past from what he is telling me  and he has some PAD in his legs as well.  Overall, he states he is doing well and is without any complaints or problems at this time. He specifically denies abdominal pain, nausea, vomiting, diarrhea, myalgias, and fatigue. He remains on dietary management as well as the following cholesterol lowering medications rosuvastatin  10 mg tablet. He is fasting in anticipation for labs today.   The patient is a 73 year old male who returns for a follow-up visit for prediabetes.  We discovered on annual lab testing in 03/2023 that he developed prediabetes with a HgBA1c of 6%.   Since the last visit, there  have been no problems. He is currently not on any medications and was asked to control this with diet and exercise.  He is trying to lay off sugars and he is walking for exercise.  He specifically denies unexplained abdominal pain, nausea or vomiting. He does not routinely check blood sugars. His last HgBA1c was done 4 months ago and was 6%.  He came in fasting today in anticipation of lab work.    The patient is a 73 year old Caucasian/White male who presents for a follow-up evaluation of hypertension.  He was diagnosed with HTN around 2015. The patient has not been checking his blood pressure at home. The patient's current medications include: amlodipine  10mg  daily and lisinopril  20mg  daily. The patient has been tolerating his medications well. The patient denies any headache, visual changes, dizziness, lightheadness, chest pain, shortness of breath, weakness/numbness, and edema.     The patient returns today for followup of his anxiety and insomnia. He had some anxiousness when he started chemotherapy for his Burkett's Lymphoma in 2017. He was having stressors with his wife having some health problems and he states he was concerned about his BPH with Dr. Marda.  He has no further problems with depression or anxiety.  He does have a history of insomnia where he was on trazodone  but the patient started weaning himself off this medication and quit trazodone  this past year.  He states he is sleeping well.          Are there smokers in your home (other than you)? No  Risk Factors Current exercise habits: as above  Dietary issues discussed: none   Depression Screen (Note: if answer to either of the following is Yes, a more complete depression screening is indicated)   Over the past two weeks, have you felt down, depressed or hopeless? No  Over the past two weeks, have you felt little interest or pleasure in doing things? No  Have you lost interest or pleasure in daily life? No  Do you often feel  hopeless? No  Do you cry easily over simple problems? No  Activities of Daily Living In your present state of health, do you have any difficulty performing the following activities?:  Driving? No Managing money?  No Feeding yourself? No Getting from bed to chair? No Climbing a flight of stairs? No Preparing food and eating?: No Bathing or showering? No Getting dressed: No Getting to the toilet? No Using the toilet:No Moving around from place to place: No In the past year have you fallen or had a near fall?:No     Hearing Difficulties: Yes -wears hearing aids Do you often ask people to speak up or repeat themselves? No Do you experience ringing or noises in your ears? No Do you have difficulty understanding soft or whispered voices? No   Do you feel that you have a  problem with memory? No  Do you often misplace items? No  Do you feel safe at home?  Yes  Cognitive Testing  Alert? Yes  Normal Appearance?Yes  Oriented to person? Yes  Place? Yes   Time? Yes  Recall of three objects?  Yes  Can perform simple calculations? Yes  Displays appropriate judgment?Yes  Can read the correct time from a watch face?Yes  Fall Risk Prevention  Any stairs in or around the home? No  If so, are there any without handrails? No  Home free of loose throw rugs in walkways, pet beds, electrical cords, etc? Yes  Adequate lighting in your home to reduce risk of falls? Yes  Use of a cane, walker or w/c? No    Time Up and Go  Was the test performed? No .  Length of time to ambulate 10 feet: 9.6 sec.   Gait steady and fast without use of assistive device    Advanced Directives have been discussed with the patient? Yes   List the Names of Other Physician/Practitioners you currently use: Patient Care Team: [name], MD as PCP - General (Internal Medicine) [name], MD as Consulting Physician (Orthopedic Surgery)    Past Medical History:  Diagnosis Date   BPH (benign  prostatic hyperplasia)    Burkitt lymphoma of lymph nodes of multiple sites (HCC) 03/08/2016   Carotid stenosis    Claudication 04/28/2015   Dyslipidemia 04/28/2015   Encounter for antineoplastic chemotherapy    Essential hypertension 04/28/2015   Murmur 02/07/2016   Neoplasm related pain    PAD (peripheral artery disease) left leg    Past Surgical History:  Procedure Laterality Date   IR GENERIC HISTORICAL  12/31/2015   IR FLUORO GUIDE CV LINE RIGHT 12/31/2015 Toribio Faes, MD [facility]   IR GENERIC HISTORICAL  12/31/2015   IR US  GUIDE VASC ACCESS RIGHT 12/31/2015 Toribio Faes, MD [facility]   IR GENERIC HISTORICAL  07/20/2016   IR REMOVAL TUN ACCESS W/ PORT W/O FL MOD SED 07/20/2016 Wilkie Lent, MD [facility]   left salivary Left    removed      Current Medications  Current Outpatient Medications  Medication Sig Dispense Refill   amLODipine  (NORVASC ) 10 MG tablet TAKE 1 TABLET BY MOUTH EVERY EVENING. 90 tablet 1   b complex vitamins tablet Take 1 tablet by mouth daily. Unknown strenght     clopidogrel  (PLAVIX ) 75 MG tablet TAKE 1 TABLET BY MOUTH EVERY EVENING. 90 tablet 1   finasteride (PROSCAR) 5 MG tablet Take 5 mg by mouth daily.     lisinopril  (ZESTRIL ) 20 MG tablet TAKE 1 TABLET BY MOUTH EVERY EVENING. 90 tablet 1   rosuvastatin  (CRESTOR ) 10 MG tablet TAKE 1 TABLET BY MOUTH DAILY. 90 tablet 1   tamsulosin (FLOMAX) 0.4 MG CAPS capsule Take 0.4 mg by mouth daily.     No current facility-administered medications for this visit.    Allergies Patient has no known allergies.   Social History Social History   Tobacco Use   Smoking status: Former    Current packs/day: 0.00    Average packs/day: 2.0 packs/day for 16.0 years (32.0 ttl pk-yrs)    Types: Cigarettes    Start date: 12/22/1989    Quit date: 12/22/2005    Years since quitting: 18.2   Smokeless tobacco: Current    Types: Chew  Substance Use Topics   Alcohol  use: No     Review of Systems Review  of Systems  Constitutional:  Negative for chills, fever, malaise/fatigue and weight loss.  Eyes:  Negative for blurred vision and double vision.  Respiratory:  Negative for cough, hemoptysis, shortness of breath and wheezing.   Cardiovascular:  Negative for chest pain, palpitations and leg swelling.  Gastrointestinal:  Negative for abdominal pain, blood in stool, constipation, diarrhea, heartburn, melena, nausea and vomiting.  Genitourinary:  Negative for frequency and hematuria.  Musculoskeletal:  Negative for myalgias.  Skin:  Negative for itching and rash.  Neurological:  Negative for dizziness, weakness and headaches.  Endo/Heme/Allergies:  Negative for polydipsia.  Psychiatric/Behavioral:  Negative for depression. The patient is not nervous/anxious and does not have insomnia.      Physical Exam:      Body mass index is 27.48 kg/m. BP 134/70   Pulse 68   Temp 98.1 F (36.7 C) (Temporal)   Resp 16   Ht 6' (1.829 m)   Wt 202 lb 9.6 oz (91.9 kg)   SpO2 95%   BMI 27.48 kg/m   Physical Exam   Assessment:      Prediabetes  Essential hypertension  Hypercholesterolemia  Stenosis of carotid artery, unspecified laterality  Benign prostatic hyperplasia without lower urinary tract symptoms  Insomnia, unspecified type  History of Burkitt's lymphoma  PAD (peripheral artery disease)  BMI 27.0-27.9,adult  Nonrheumatic aortic valve stenosis  History of colon polyps    Plan:     During the course of the visit the patient was educated and counseled about appropriate screening and preventive services including:   Pneumococcal vaccine  Influenza vaccine Colorectal cancer screening Advanced Directives- discussed  Diet review for nutrition referral? Yes ____  Not Indicated _X___   Patient Instructions (the written plan) was given to the patient.  Aortic stenosis mild based on echocardiogram from 2022 Cardiology is following him and will continue to  monitor.  Essential hypertension His BP is controlled.  We will continue his current meds with amlodipine  and lisinopril .  PAD (peripheral artery disease) Continue risk factor modification with control of prediabetes, HTN and HCL.  He is on a statin and remains on plavix  at this time.  Stenosis of carotid artery He had a repeat carotid US  this year.  Continue with statin and plavix .    Benign prostatic hyperplasia without lower urinary tract symptoms He remains on flomax and denies any symptoms.  He is followed by urology.  BMI 27.0-27.9,adult I want him to eat healthy and exercise.  History of Burkitt's lymphoma He saw oncology in 12/2023 and he remains in remission at this time.  Hypercholesterolemia We will check his FLP and goal LDL <70.  Insomnia He is no longer on trazodone  and seems to be sleeping well.  Prediabetes He is to continue to control this with diet and exercise.  We will check his HgBA1c today.   Prevention Health maintenance discussed.  We will give him another FIT test.  We will obtain some yearly labs.    Medicare Attestation I have personally reviewed: The patient's medical and social history Their use of alcohol , tobacco or illicit drugs Their current medications and supplements The patient's functional ability including ADLs,fall risks, home safety risks, cognitive, and hearing and visual impairment Diet and physical activities Evidence for depression or mood disorders  The patient's weight, height, and BMI have been recorded in the chart.  I have made referrals, counseling, and provided education to the patient based on review of the above and I have provided the patient with a written personalized care  plan for preventive services.     Selinda Fleeta Finger, MD   03/12/2024

## 2024-03-12 NOTE — Assessment & Plan Note (Signed)
 He had a repeat carotid US  this year.  Continue with statin and plavix .

## 2024-03-12 NOTE — Assessment & Plan Note (Signed)
 His BP is controlled.  We will continue his current meds with amlodipine  and lisinopril .

## 2024-03-12 NOTE — Assessment & Plan Note (Signed)
 He is to continue to control this with diet and exercise.  We will check his HgBA1c today.

## 2024-03-12 NOTE — Assessment & Plan Note (Signed)
I want him to eat healthy and exercise.

## 2024-03-12 NOTE — Assessment & Plan Note (Signed)
 He saw oncology in 12/2023 and he remains in remission at this time.

## 2024-03-12 NOTE — Assessment & Plan Note (Signed)
 We will check his FLP and goal LDL <70.

## 2024-03-12 NOTE — Assessment & Plan Note (Signed)
 He remains on flomax and denies any symptoms.  He is followed by urology.

## 2024-03-12 NOTE — Assessment & Plan Note (Signed)
 Continue risk factor modification with control of prediabetes, HTN and HCL.  He is on a statin and remains on plavix  at this time.

## 2024-03-12 NOTE — Assessment & Plan Note (Signed)
 Cardiology is following him and will continue to monitor.

## 2024-03-12 NOTE — Assessment & Plan Note (Signed)
 He is no longer on trazodone  and seems to be sleeping well.

## 2024-03-13 ENCOUNTER — Other Ambulatory Visit: Payer: Self-pay | Admitting: Internal Medicine

## 2024-03-13 DIAGNOSIS — Z860100 Personal history of colon polyps, unspecified: Secondary | ICD-10-CM | POA: Diagnosis not present

## 2024-03-13 LAB — LIPID PANEL
Chol/HDL Ratio: 2.6 ratio (ref 0.0–5.0)
Cholesterol, Total: 97 mg/dL — ABNORMAL LOW (ref 100–199)
HDL: 37 mg/dL — ABNORMAL LOW (ref >39)
LDL Chol Calc (NIH): 38 mg/dL (ref 0–99)
Triglycerides: 122 mg/dL (ref 0–149)
VLDL Cholesterol Cal: 22 mg/dL (ref 5–40)

## 2024-03-13 LAB — CBC WITH DIFFERENTIAL/PLATELET
Basophils Absolute: 0.1 x10E3/uL (ref 0.0–0.2)
Basos: 1 %
EOS (ABSOLUTE): 0.3 x10E3/uL (ref 0.0–0.4)
Eos: 5 %
Hematocrit: 40.5 % (ref 37.5–51.0)
Hemoglobin: 12.9 g/dL — ABNORMAL LOW (ref 13.0–17.7)
Immature Grans (Abs): 0 x10E3/uL (ref 0.0–0.1)
Immature Granulocytes: 0 %
Lymphocytes Absolute: 1.4 x10E3/uL (ref 0.7–3.1)
Lymphs: 24 %
MCH: 29.7 pg (ref 26.6–33.0)
MCHC: 31.9 g/dL (ref 31.5–35.7)
MCV: 93 fL (ref 79–97)
Monocytes Absolute: 0.6 x10E3/uL (ref 0.1–0.9)
Monocytes: 10 %
Neutrophils Absolute: 3.6 x10E3/uL (ref 1.4–7.0)
Neutrophils: 60 %
Platelets: 201 x10E3/uL (ref 150–450)
RBC: 4.35 x10E6/uL (ref 4.14–5.80)
RDW: 12.8 % (ref 11.6–15.4)
WBC: 6 x10E3/uL (ref 3.4–10.8)

## 2024-03-13 LAB — CMP14 + ANION GAP
ALT: 27 IU/L (ref 0–44)
AST: 29 IU/L (ref 0–40)
Albumin: 4.3 g/dL (ref 3.8–4.8)
Alkaline Phosphatase: 93 IU/L (ref 47–123)
Anion Gap: 12 mmol/L (ref 10.0–18.0)
BUN/Creatinine Ratio: 16 (ref 10–24)
BUN: 11 mg/dL (ref 8–27)
Bilirubin Total: 0.6 mg/dL (ref 0.0–1.2)
CO2: 27 mmol/L (ref 20–29)
Calcium: 9.2 mg/dL (ref 8.6–10.2)
Chloride: 102 mmol/L (ref 96–106)
Creatinine, Ser: 0.7 mg/dL — ABNORMAL LOW (ref 0.76–1.27)
Globulin, Total: 2.3 g/dL (ref 1.5–4.5)
Glucose: 115 mg/dL — ABNORMAL HIGH (ref 70–99)
Potassium: 4.4 mmol/L (ref 3.5–5.2)
Sodium: 141 mmol/L (ref 134–144)
Total Protein: 6.6 g/dL (ref 6.0–8.5)
eGFR: 98 mL/min/1.73 (ref >59)

## 2024-03-13 LAB — TSH: TSH: 1.33 u[IU]/mL (ref 0.450–4.500)

## 2024-03-13 LAB — HEMOGLOBIN A1C
Est. average glucose Bld gHb Est-mCnc: 123 mg/dL
Hgb A1c MFr Bld: 5.9 % — ABNORMAL HIGH (ref 4.8–5.6)

## 2024-03-14 LAB — FECAL OCCULT BLOOD, IMMUNOCHEMICAL: Fecal Occult Bld: NEGATIVE

## 2024-03-15 ENCOUNTER — Other Ambulatory Visit: Payer: Self-pay | Admitting: Cardiology

## 2024-03-15 DIAGNOSIS — I739 Peripheral vascular disease, unspecified: Secondary | ICD-10-CM

## 2024-03-15 DIAGNOSIS — C8378 Burkitt lymphoma, lymph nodes of multiple sites: Secondary | ICD-10-CM

## 2024-04-10 ENCOUNTER — Ambulatory Visit: Payer: Self-pay

## 2024-04-10 NOTE — Progress Notes (Signed)
 Patient called.  Patient aware.

## 2024-04-23 ENCOUNTER — Ambulatory Visit: Payer: Self-pay

## 2024-04-23 NOTE — Progress Notes (Signed)
 Patient called.  Left message for patient to call back.  Per Dr. Fleeta Finger He has no blood in his stools. .  Letter sent.

## 2024-05-30 ENCOUNTER — Ambulatory Visit: Admitting: Cardiology

## 2024-06-14 ENCOUNTER — Encounter: Payer: Self-pay | Admitting: Cardiology

## 2024-06-14 ENCOUNTER — Ambulatory Visit: Attending: Cardiology | Admitting: Cardiology

## 2024-06-14 VITALS — BP 128/70 | HR 68 | Ht 72.0 in | Wt 194.0 lb

## 2024-06-14 DIAGNOSIS — E785 Hyperlipidemia, unspecified: Secondary | ICD-10-CM

## 2024-06-14 DIAGNOSIS — I10 Essential (primary) hypertension: Secondary | ICD-10-CM

## 2024-06-14 DIAGNOSIS — I350 Nonrheumatic aortic (valve) stenosis: Secondary | ICD-10-CM

## 2024-06-14 DIAGNOSIS — I739 Peripheral vascular disease, unspecified: Secondary | ICD-10-CM | POA: Diagnosis not present

## 2024-06-14 DIAGNOSIS — Z8572 Personal history of non-Hodgkin lymphomas: Secondary | ICD-10-CM

## 2024-06-14 NOTE — Progress Notes (Signed)
 " Cardiology Office Note:    Date:  06/14/2024   ID:  Erik Vaughn, DOB Aug 13, 1950, MRN [id number]  PCP:  [name], MD  Cardiologist:  Lamar Fitch, MD    Referring MD: Fleeta Valeria, Mayo, MD   Chief Complaint  Patient presents with   Follow-up  Doing well  History of Present Illness:    Erik Vaughn is a 74 y.o. male past medical history significant for essential hypertension, mild aortic stenosis, last assessment in April 2024 with aortic valve area 1.7, mean gradient 13, dimensionless index 0.55, carotic arterial stenosis, last assessment in June 2025 which showed less than 50% stenosis of right internal carotic artery with 50 to 69% stenosis on the left side internal carotic artery.  Additional problem include Burkitt's lymphoma stable, comes today to months for follow-up [place] area and ask him to follow-up with cardiologist over there as well he will require cardiac ultrasounds as well as echocardiogram to assess his valve as well as his carotid artery stenosis.  Likely he is asymptomatic.  He is very happy living on because he walks with the dog all the time denies have any chest pain tightness squeezing pressure burning chest overall he said that he improved from a physical standpoint review.  No dizziness no passing out no chest pain  Past Medical History:  Diagnosis Date   BPH (benign prostatic hyperplasia)    Burkitt lymphoma of lymph nodes of multiple sites (HCC) 03/08/2016   Carotid stenosis    Claudication 04/28/2015   Dyslipidemia 04/28/2015   Encounter for antineoplastic chemotherapy    Essential hypertension 04/28/2015   Murmur 02/07/2016   Neoplasm related pain    PAD (peripheral artery disease) left leg    Past Surgical History:  Procedure Laterality Date   IR GENERIC HISTORICAL  12/31/2015   IR FLUORO GUIDE CV LINE RIGHT 12/31/2015 Toribio Faes, MD [facility]   IR  GENERIC HISTORICAL  12/31/2015   IR US  GUIDE VASC ACCESS RIGHT 12/31/2015 Toribio Faes, MD [facility]   IR GENERIC HISTORICAL  07/20/2016   IR REMOVAL TUN ACCESS W/ PORT W/O FL MOD SED 07/20/2016 Wilkie Lent, MD [facility]   left salivary Left    removed    Current Medications: Active Medications[1]   Allergies:   Patient has no known allergies.   Social History   Socioeconomic History   Marital status: Married    Spouse name: Not on file   Number of children: Not on file   Years of education: Not on file   Highest education level: Not on file  Occupational History   Occupation: meter specialist  Tobacco Use   Smoking status: Former    Current packs/day: 0.00    Average packs/day: 2.0 packs/day for 16.0 years (32.0 ttl pk-yrs)    Types: Cigarettes    Start date: 12/22/1989    Quit date: 12/22/2005    Years since quitting: 18.4   Smokeless tobacco: Current    Types: Chew  Vaping Use   Vaping status: Never Used  Substance and Sexual Activity   Alcohol  use: No   Drug use: No   Sexual activity: Never  Other Topics Concern   Not on file  Social History Narrative   Not on file   Social Drivers of Health   Tobacco Use: High Risk (06/14/2024)   Patient History    Smoking Tobacco  Use: Former    Smokeless Tobacco Use: Current    Passive Exposure: Not on Actuary Strain: Not on file  Food Insecurity: Not on file  Transportation Needs: Not on file  Physical Activity: Not on file  Stress: Not on file  Social Connections: Not on file  Depression (EYV7-0): Not on file  Alcohol  Screen: Not on file  Housing: Not on file  Utilities: Not on file  Health Literacy: Not on file     Family History: The patient's family history includes Alzheimer's disease (age of onset: 37) in his father. ROS:   Please see the history of present illness.    All 14 point review of systems negative except as described per history of present illness  EKGs/Labs/Other  Studies Reviewed:    EKG Interpretation Date/Time:  Thursday June 14 2024 10:59:16 EST Ventricular Rate:  68 PR Interval:  154 QRS Duration:  82 QT Interval:  374 QTC Calculation: 397 R Axis:   61  Text Interpretation: Normal sinus rhythm Normal ECG When compared with ECG of 06-Feb-2016 22:41, PREVIOUS ECG IS PRESENT Confirmed by Bernie Charleston [phone]) on 06/14/2024 11:03:21 AM    Recent Labs: 03/12/2024: ALT 27; BUN 11; Creatinine, Ser 0.70; Hemoglobin 12.9; Platelets 201; Potassium 4.4; Sodium 141; TSH 1.330  Recent Lipid Panel    Component Value Date/Time   CHOL 97 (L) 03/12/2024 0759   TRIG 122 03/12/2024 0759   HDL 37 (L) 03/12/2024 0759   CHOLHDL 2.6 03/12/2024 0759   LDLCALC 38 03/12/2024 0759    Physical Exam:    VS:  BP 128/70   Pulse 68   Ht 6' (1.829 m)   Wt 194 lb (88 kg)   SpO2 98%   BMI 26.31 kg/m     Wt Readings from Last 3 Encounters:  06/14/24 194 lb (88 kg)  03/12/24 202 lb 9.6 oz (91.9 kg)  01/20/24 199 lb 12.8 oz (90.6 kg)     GEN:  Well nourished, well developed in no acute distress HEENT: Normal NECK: No JVD; No carotid bruits LYMPHATICS: No lymphadenopathy CARDIAC: RRR, systolic ejection murmur grade 2/6 to 3/6 posterior right upper portion of the sternum, S2 is still present, no rubs, no gallops RESPIRATORY:  Clear to auscultation without rales, wheezing or rhonchi  ABDOMEN: Soft, non-tender, non-distended MUSCULOSKELETAL:  No edema; No deformity  SKIN: Warm and dry LOWER EXTREMITIES: no swelling NEUROLOGIC:  Alert and oriented x 3 PSYCHIATRIC:  Normal affect   ASSESSMENT:    1. Essential hypertension   2. PAD (peripheral artery disease)   3. Nonrheumatic aortic valve stenosis   4. Dyslipidemia   5. History of Burkitt's lymphoma    PLAN:    In order of problems listed above:  Aortic stenosis asymptomatic so far last assessment more than a year ago required another [place]  I told him that the 3 symptoms of critical arctic stenosis include shortness of breath, chest pain, dizziness or passing out if he develop this he need to ring alarms, but I doubt there will be quick progression of this issue at the physical exam there is no characteristics of severe aortic stenosis. Peripheral vascular disease: He will require cardiac ultrasound to reassess that. Dyslipidemia he is taking Crestor  10, I did review [facility] which show me his LDL of 38 HDL 37 this is from 03/12/2024.  Will continue present management. History of Burkitt's lymphoma stable. Overall he is doing very  well I encouraged him to find cardiologist at the place that he is right now.  I told him also if you need any help from our point we will be more glad to help him   Medication Adjustments/Labs and Tests Ordered: Current medicines are reviewed at length with the patient today.  Concerns regarding medicines are outlined above.  Orders Placed This Encounter  Procedures   EKG 12-Lead   Medication changes: No orders of the defined types were placed in this encounter.   Signed, Lamar DOROTHA Fitch, MD, [facility] 06/14/2024 11:14 AM    [facility]    [1]  Current Meds  Medication Sig   amLODipine  (NORVASC ) 10 MG tablet Take 1 tablet (10 mg total) by mouth every evening.   b complex vitamins tablet Take 1 tablet by mouth daily. Unknown strenght   clopidogrel  (PLAVIX ) 75 MG tablet Take 1 tablet (75 mg total) by mouth every evening.   finasteride (PROSCAR) 5 MG tablet Take 5 mg by mouth daily.   lisinopril  (ZESTRIL ) 20 MG tablet Take 1 tablet (20 mg total) by mouth every evening.   rosuvastatin  (CRESTOR ) 10 MG tablet TAKE 1 TABLET BY MOUTH DAILY.   tamsulosin (FLOMAX) 0.4 MG CAPS capsule Take 0.4 mg by mouth daily.   "

## 2024-06-14 NOTE — Patient Instructions (Addendum)
 Medication Instructions:   *If you need a refill on your cardiac medications before your next appointment, please call your pharmacy*  Lab Work:  If you have labs (blood work) drawn today and your tests are completely normal, you will receive your results only by: MyChart Message (if you have MyChart) OR A paper copy in the mail If you have any lab test that is abnormal or we need to change your treatment, we will call you to review the results.  Testing/Procedures:   Follow-Up: At [facility], you and your health needs are our priority.  As part of our continuing mission to provide you with exceptional heart care, our providers are all part of one team.  This team includes your primary Cardiologist (physician) and Advanced Practice Providers or APPs (Physician Assistants and Nurse Practitioners) who all work together to provide you with the care you need, when you need it.  Your next appointment:   Follow up as needed  Provider:  Dr. Krasowski     We recommend signing up for the patient portal called MyChart.  Sign up information is provided on this After Visit Summary.  MyChart is used to connect with patients for Virtual Visits (Telemedicine).  Patients are able to view lab/test results, encounter notes, upcoming appointments, etc.  Non-urgent messages can be sent to your provider as well.   To learn more about what you can do with MyChart, go to [email or web address].   Other Instructions
